# Patient Record
Sex: Female | Born: 2000
Health system: Southern US, Community
[De-identification: ages and names within clinical notes are randomized; demographics above are authoritative.]

## PROBLEM LIST (undated history)

## (undated) DIAGNOSIS — R569 Unspecified convulsions: Secondary | ICD-10-CM

## (undated) DIAGNOSIS — E119 Type 2 diabetes mellitus without complications: Secondary | ICD-10-CM

## (undated) DIAGNOSIS — G40A09 Absence epileptic syndrome, not intractable, without status epilepticus: Secondary | ICD-10-CM

## (undated) DIAGNOSIS — G4730 Sleep apnea, unspecified: Secondary | ICD-10-CM

## (undated) DIAGNOSIS — T7840XA Allergy, unspecified, initial encounter: Secondary | ICD-10-CM

## (undated) DIAGNOSIS — R198 Other specified symptoms and signs involving the digestive system and abdomen: Secondary | ICD-10-CM

## (undated) DIAGNOSIS — K529 Noninfective gastroenteritis and colitis, unspecified: Secondary | ICD-10-CM

## (undated) DIAGNOSIS — Z9889 Other specified postprocedural states: Secondary | ICD-10-CM

## (undated) DIAGNOSIS — L309 Dermatitis, unspecified: Secondary | ICD-10-CM

## (undated) DIAGNOSIS — T8859XA Other complications of anesthesia, initial encounter: Secondary | ICD-10-CM

## (undated) DIAGNOSIS — Q8789 Other specified congenital malformation syndromes, not elsewhere classified: Secondary | ICD-10-CM

## (undated) DIAGNOSIS — R7303 Prediabetes: Secondary | ICD-10-CM

## (undated) DIAGNOSIS — E301 Precocious puberty: Secondary | ICD-10-CM

## (undated) DIAGNOSIS — R278 Other lack of coordination: Secondary | ICD-10-CM

## (undated) DIAGNOSIS — R1310 Dysphagia, unspecified: Secondary | ICD-10-CM

## (undated) DIAGNOSIS — R112 Nausea with vomiting, unspecified: Secondary | ICD-10-CM

## (undated) DIAGNOSIS — E669 Obesity, unspecified: Secondary | ICD-10-CM

## (undated) DIAGNOSIS — F88 Other disorders of psychological development: Secondary | ICD-10-CM

## (undated) HISTORY — DX: Noninfective gastroenteritis and colitis, unspecified: K52.9

## (undated) HISTORY — DX: Other disorders of psychological development: F88

## (undated) HISTORY — DX: Absence epileptic syndrome, not intractable, without status epilepticus: G40.A09

## (undated) HISTORY — PX: TYMPANOSTOMY TUBE PLACEMENT: SHX32

## (undated) HISTORY — DX: Other lack of coordination: R27.8

## (undated) HISTORY — DX: Other specified congenital malformation syndromes, not elsewhere classified: Q87.89

## (undated) HISTORY — PX: TONSILLECTOMY AND ADENOIDECTOMY: SHX28

## (undated) HISTORY — PX: ADENOIDECTOMY: SUR15

## (undated) HISTORY — PX: TONSILLECTOMY: SUR1361

## (undated) HISTORY — DX: Dermatitis, unspecified: L30.9

---
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
## 2020-01-05 ENCOUNTER — Other Ambulatory Visit (INDEPENDENT_AMBULATORY_CARE_PROVIDER_SITE_OTHER): Payer: Self-pay | Admitting: Pediatric Endocrinology

## 2020-01-05 DIAGNOSIS — Z794 Long term (current) use of insulin: Secondary | ICD-10-CM

## 2020-01-05 DIAGNOSIS — E1165 Type 2 diabetes mellitus with hyperglycemia: Secondary | ICD-10-CM

## 2020-01-07 NOTE — Progress Notes (Signed)
S:     Chief Complaint  Patient presents with  . Diabetes    Education    Endocrinology provider: Dr. [name] (upcoming appt 01/17/20 10:00 AM)  Patient referred to me by Dr. [name] for closer DM management. 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 Patient was instructed to discontinue Lantus 5 units daily. I also advised patient to increase Ozempic dose from 0.25 mg subQ once weekly --> Ozempic 0.5 mg subQ once weekly on 01/01/20 (4 weeks from initiation of Ozempic). Also patient was having significant pain in her feet. Advised patient to apply Voltaren 2g gel before work, when patient gets home from work, and 3 hours after patient get home from work (can apply every 3 hours if you would like; 16 g/day to any single joint of lower extremity (max: 8x per day). Informed patient if she was having worse pain that was uncontrolled by Voltaren gel then she could feet in epsom salt 1-2x per week for up 15 minutes but dry completely then apply voltaren gel and apply lotion (make sure lotion is not alcohol based - cocoa butter is okay to use). If your feet become too dry then stop soaking in epsom salt.  Patient presents with mom. Patient has been having issues with Dexcom G6 CGM - specifically with adhesion. She states she is sweating more with new job and she is exercising more. She likes [employer] - she will increase working from [employer] (4 hour shifts) to Sun/Mon/Tues in the AM Wed/Thurs in the PM. 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 She will quit [employer] soon. 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  She confirms she increased Ozempic from 0.25 mg on 11/2. She experienced GI upset from 11/2 to 11/18.   Her foot pain is "terrible". She sees her orthopedic provider and podiatrist soon. She tried more Voltaren gelepsom salt - didn't help.  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  --Treats hypoglycemic episode with jolly ranchers  --Hypoglycemic symptoms: fatigue  Insurance Coverage: [employer] [facility] [employer], [place] [address]  [address] [place], [place] [address]  Phone:  [phone] Fax:  [phone]  DEA #:  [id number]  Medication Adherence -Patient reports adherence with medications.  -Current diabetes medications include: Ozempic 0.5 mg subQ once weekly (initiated 12/04/19; injects on Tuesdays) -Prior diabetes medications include: metformin (switched to Victoza), Trulicity (painful), Humalog/Novolog (correction), Victoza (switched to Ozempic), Lantus (able to taper off when patient initiated on Ozempic)  Injection Sites  (no changes since prior pharmacy appt on 12/11/19) -Patient reports injection sites are abdomen, outer legs, back of arms, top of buttocks --Patient reports independently injecting DM medications. --Patient reports rotating injection sites  Diet  (no changes since prior pharmacy appt on 12/11/19) Patient reported dietary habits:  Eats 2 meals/day and 0 snacks/day Breakfast (9AM): 1 piece of toast with peanut butter and applesauce Lunch (skips) Dinner (7PM): steak, hamburger, lima beans/green beans/broccoli/salad Snacks: does not snack Drinks: sugar  free drinks (gatorade, twist juice, water, milk) -Drinks milk every day with breakfast  Carbohydrate Intake -Pasta: once weekly -Rice: does not eat -Bread: sometimes eats roll with dinner (about once per week) -Cereal: does not eat -Corn: 3-4x per week -Peas: 3-4x per week -Potatoes: once weekly -Squash: does not eat -Fruit: 3-4x per week (pears, apples, grapes, strawberries)  Exercise  (no changes since prior pharmacy appt on 12/11/19): Patient-reported exercise habits: Wed (physical therapy), youtube body exercise video 1 hour every day, Zumba 7PM-8PM once weekly; she walks whenever she sees her BG is elevated and logs exercise on her Dexcom Clarity report   Monitoring: Patient 2x episodes of nocturia (nighttime urination) each night.  Patient denies neuropathy (nerve pain)  Patient denies  visual changes. (Followed by ophthalmology (last seen 11/01/2019)) Patient reports self foot exams.  -Patient reports socks/slippers in the house and shoes outside.  -Patient reports monitoring for open wounds/cuts on her feet.   O:  Dexcom G6 Report (12/11/19 - 01/17/20) v  Patient stopped wearing sensor from 12/25/19 - 01/13/20  Labs:     Vitals:   01/17/20 0940  BP: 120/78  Pulse: 80    Lab Results  Component Value Date   HGBA1C 6.6 (A) 12/11/2019   HGBA1C 6.3 (A) 09/20/2019   HGBA1C >14 05/17/2019    Lab Results  Component Value Date   CPEPTIDE 3.08 08/15/2017       Component Value Date/Time   CHOL 152 11/15/2018 1154   TRIG 63.0 11/15/2018 1154   HDL 38.20 (L) 11/15/2018 1154   CHOLHDL 4 11/15/2018 1154   VLDL 12.6 11/15/2018 1154   LDLCALC 101 (H) 11/15/2018 1154   LDLCALC 104 08/15/2017 0000    No results found for: MICRALBCREAT  Assessment:  DM medications - It is challenging to fully assess DM control considering patient has had issues wearing Dexcom G6 CGM, however, past few days of Dexcom use show TIR > 70% and no hypoglycemia. Encouraged patient for  her success! Patient was experiencing GI upset so will continue 0.5 mg dose and slow down titration. Will re-assess dose toleration in 1 month.  CGM issues - Discussed how to resolve Dexcom issues (adhesion) - 1) apply skin tac before --> apply dexcom --> apply skin tac on top of dexcom --> apply sensor overpatch 2) try grif grips.   Foot pain/foot care --> Patient attempted increase in Voltaren use/seldom use of Epsom salt. Neither provided relief for foot pain. Advised her to stop using Epsom salt. She can continue Voltaren use if she would like. Advised her to follow up with orthopedic provider and podiatrist.    Plan: 1. Medications: a. Continue Ozempic 0.5 mg subQ once weekly (will do slower titration due to GI upset) b. Will discuss further titration of Ozempic dose at follow up appointment 2. Diet:  a. Did not discuss at today's appointment 3. Exercise: a. Encouraged patient for continuing to log exercise on Dexcom app and for continuing to exercise on a frequent basis 4. Foot pain/foot care  a. Continue Voltaren 2g gel before work, when patient gets home from work, and 3 hours after patient get home from work (can apply every 3 hours if you would like; 16 g/day to any single joint of lower extremity (max: 8x per day). b. Stop seldom use  of epsom salt 5. BG Monitoring:  a. Continue using Dexcom G6 CGM (does NOT require PA via her insurance) b. Magda Muise has a diagnosis of diabetes, checks blood glucose readings > 4x per day, and requires frequent adjustments to insulin regimen. This patient will be seen every six months, minimally, to assess adherence to their CGM regimen and diabetes treatment plan. 6. Follow Up: 1 month  Written patient instructions provided.    This appointment required 30 minutes of patient care (this includes precharting, chart review, review of results, face-to-face care, etc.).  Thank you for involving clinical pharmacist/diabetes educator to assist in  providing this patient's care.  Drexel Iha, PharmD, CPP, CDCES

## 2020-01-17 ENCOUNTER — Other Ambulatory Visit: Payer: Self-pay

## 2020-01-17 ENCOUNTER — Encounter (INDEPENDENT_AMBULATORY_CARE_PROVIDER_SITE_OTHER): Payer: Self-pay | Admitting: Pharmacist

## 2020-01-17 ENCOUNTER — Ambulatory Visit (INDEPENDENT_AMBULATORY_CARE_PROVIDER_SITE_OTHER): Payer: 59 | Admitting: Pediatric Endocrinology

## 2020-01-17 ENCOUNTER — Ambulatory Visit (INDEPENDENT_AMBULATORY_CARE_PROVIDER_SITE_OTHER): Payer: 59 | Admitting: Pharmacist

## 2020-01-17 VITALS — BP 120/78 | HR 80 | Wt 209.2 lb

## 2020-01-17 DIAGNOSIS — L732 Hidradenitis suppurativa: Secondary | ICD-10-CM

## 2020-01-17 DIAGNOSIS — E11628 Type 2 diabetes mellitus with other skin complications: Secondary | ICD-10-CM | POA: Diagnosis not present

## 2020-01-17 DIAGNOSIS — Z794 Long term (current) use of insulin: Secondary | ICD-10-CM | POA: Diagnosis not present

## 2020-01-17 DIAGNOSIS — E1165 Type 2 diabetes mellitus with hyperglycemia: Secondary | ICD-10-CM

## 2020-01-17 LAB — POCT GLUCOSE (DEVICE FOR HOME USE): POC Glucose: 142 mg/dl — AB (ref 70–99)

## 2020-01-17 MED ORDER — CLINDAMYCIN PHOS-BENZOYL PEROX 1-5 % EX GEL: Freq: Two times a day (BID) | CUTANEOUS | 0 refills | Status: DC

## 2020-01-17 NOTE — Progress Notes (Signed)
Subjective:  Subjective  Patient Name: Deanna Levine Date of Birth: Feb 23, 2001  MRN: [id number]  Deanna Levine  presents to clinic today for follow-up evaluation and management of her  obesity, prediabetes, acanthosis   HISTORY OF PRESENT ILLNESS:   Chesni is a 19 y.o. AA female   Keyani is accompanied by her mother  1. Miroslava was initially followed in pediatric endocrine clinic for early puberty complicated by profound developmental delay. [name] now post menarchal and follows in clinic for Type 2 Diabetes and metabolic syndrome.    2. The patient's last [facility] visit was on 09/19/19 .   She has been working with Dr. Lovena Le over the past few months. [name] now wearing a Dexcom CGM and using Ozembic 0.5 mg once a week. She says that it hurts sometimes. She does like that she only has to do it once a week. She has had some upset stomach with it. They are planning to hold at her current dose for now.   [name] still having breakthrough bleeding from her hidradenitis on her chest and stomach and legs. She feels that they come up from nowhere and burst. She went to see [name] who said if she can treat them topically it is better than surgery.   She has continued to have pain in her feet. [name] following up with podiatry and orthopedics. [name] wearing a lift in one foot and that is the foot that is hurting the most.   [name] no longer taking any Lantus insulin.   She had a 2 days EEG for possible seizure- but the camera failed and they need to redo the entire thing.   When she sees that her sugar is higher on her Dexcom she gets up and exercises.   3. Pertinent Review of Systems:  Constitutional: The patient feels "good". The patient seems healthy and active. Eyes: Vision seems to be good. There are no recognized eye problems. Wears glasses.  Neck: The patient has no complaints of anterior neck swelling, soreness, tenderness, pressure, discomfort, or difficulty swallowing.   Heart:  Heart rate increases with exercise or other physical activity. The patient has no complaints of palpitations, irregular heart beats, chest pain, or chest pressure.   Lungs: no asthma or wheezing.  Gastrointestinal: Bowel movents seem normal. The patient has no complaints of excessive hunger, acid reflux, upset stomach, stomach aches or pains, diarrhea. Some constipation.  Legs: Muscle mass and strength seem normal. There are no complaints of numbness, tingling, burning, or pain. No edema is noted.  Feet: Per HPI Neurologic: Has been having staring spells. [name] followed by [name] in adult neurology GYN/GU: Menarche 07/2014, age 26. LMP 12/25/19. [name] no longer having issues with heavy periods. Skin: continued issues with hydradenitis and multiple abscesses- mostly axillary - now also up under her breasts.  Psych: She feels that she needs to talk to someone  Glucose review:      CGM Libre 2 Sensor. Avg Glu 132. 2% >250. 13% 181-250. 84% in target. 1% <70.   Last visit: CGM Libre 2 Sensor. Avg 124. 5% 181-250. 95% in target.    PAST MEDICAL, FAMILY, AND SOCIAL HISTORY  Past Medical History:  Diagnosis Date  . Allergy   . Difficulty swallowing pills   . Dyspraxia   . Global developmental delay   . Obesity (BMI 30-39.9)   . Pre-diabetes   . Precocious female puberty   . Seizures ([place])  last seizure 2012 - is being weaned off med., will finish med. 06/08/2013    Family History  Problem Relation Age of Onset  . Diabetes Mother        steroid induced; hx. colitis  . Anesthesia problems Mother        severe headache lasting 2-3 days  . Hypertension Maternal Grandmother   . Hyperlipidemia Maternal Grandmother   . Diabetes Maternal Grandmother   . Kidney disease Maternal Grandfather   . Hypertension Maternal Grandfather   . Heart disease Maternal Grandfather   . Hyperlipidemia Maternal Grandfather   . Kidney disease Paternal Grandmother   . Hypertension Paternal Grandmother    . Diabetes Paternal Grandmother   . Heart disease Paternal Grandmother   . Hyperlipidemia Paternal Grandmother   . Hypertension Paternal Grandfather   . Hyperlipidemia Paternal Grandfather   . Henoch-Schonlein purpura Brother        in remission  . Asthma Maternal Aunt   . Seizures Maternal Uncle      Current Outpatient Medications:  .  ACCU-CHEK GUIDE test strip, USE TO TEST BLOOD SUGAR LEVELS 4 TIMES DAILY (Patient not taking: Reported on 01/17/2020), Disp: 150 strip, Rfl: 5 .  Accu-Chek Softclix Lancets lancets, Use to test up to 6x daily (Patient not taking: Reported on 01/17/2020), Disp: 200 each, Rfl: 5 .  blood glucose meter kit and supplies, Dispense based on patient and insurance preference. Use up to four times daily as directed. (FOR ICD-10 E10.9, E11.9). (Patient not taking: Reported on 01/17/2020), Disp: 1 each, Rfl: 0 .  Blood Glucose Monitoring Suppl (ONETOUCH VERIO) w/Device KIT, 1 kit by Does not apply route as directed. Use glucometer to check sugar up to 3x per day (E11.9) (Patient not taking: Reported on 01/17/2020), Disp: 1 kit, Rfl: 3 .  cetirizine (ZYRTEC) 10 MG tablet, Take 10 mg by mouth daily., Disp: , Rfl:  .  chlorhexidine (HIBICLENS) 4 % external liquid, Apply topically daily as needed., Disp: 120 mL, Rfl: 0 .  clindamycin-benzoyl peroxide (BENZACLIN) gel, Apply topically 2 (two) times daily., Disp: 25 g, Rfl: 0 .  Continuous Blood Gluc Receiver (DEXCOM G6 RECEIVER) DEVI, 1 Device by Does not apply route as directed., Disp: 1 each, Rfl: 2 .  Continuous Blood Gluc Sensor (DEXCOM G6 SENSOR) MISC, Inject 1 applicator into the skin as directed. (change sensor every 10 days), Disp: 3 each, Rfl: 11 .  Continuous Blood Gluc Transmit (DEXCOM G6 TRANSMITTER) MISC, Inject 1 Device into the skin as directed. (re-use up to 8x with each new sensor), Disp: 1 each, Rfl: 3 .  diclofenac Sodium (VOLTAREN) 1 % GEL, Apply to right foot before work and after work for 60 days.,  Disp: 350 g, Rfl: 1 .  insulin glargine (LANTUS SOLOSTAR) 100 UNIT/ML Solostar Pen, Inject Up to 50 units per day (Patient not taking: Reported on 01/17/2020), Disp: 15 mL, Rfl: 5 .  insulin lispro (HUMALOG) 100 UNIT/ML KwikPen, Up to 45 units per day per sliding scale plus meal insulin as directed by physician (Patient not taking: Reported on 01/17/2020), Disp: 15 mL, Rfl: 5 .  Insulin Pen Needle (INSUPEN PEN NEEDLES) 32G X 4 MM MISC, BD Pen Needles- brand specific. Inject insulin via insulin pen 6 x daily (Patient not taking: Reported on 01/17/2020), Disp: 200 each, Rfl: 3 .  Multiple Vitamins-Minerals (MULTIVITAMIN ADULT) CHEW, Chew 1 tablet by mouth daily. Patient purchases [employer] Vitamin D3 K2 Gummies Bone and Heart Health Formula Immune Support Veggie Gummies brand.  This contains 5000 units vitamin D3 and 100 mcg of vitamin K2. (12/11/19 MT), Disp: , Rfl:  .  Semaglutide,0.25 or 0.5MG/DOS, (OZEMPIC, 0.25 OR 0.5 MG/DOSE,) 2 MG/1.5ML SOPN, Inject 0.25 mg into the skin once a week., Disp: 1.5 mL, Rfl: 11 .  triamcinolone (NASACORT ALLERGY 24HR) 55 MCG/ACT AERO nasal inhaler, Place 2 sprays into the nose daily., Disp: , Rfl:  .  tropicamide (MYDRIACYL) 1 % ophthalmic solution, 1 drop 2 (two) times daily., Disp: , Rfl:   Allergies as of 01/17/2020  . (No Known Allergies)     reports that she has never smoked. She has never used smokeless tobacco. She reports that she does not drink alcohol and does not use drugs. Pediatric History  Patient Parents  . Rithika, Seel (Mother)  . Demarcus Jr,Clifton (Father)   Other Topics Concern  . Not on file  Social History Narrative   Emaline is graduated. [name] doing average.    She enjoys playing softball and basketball.   Lives with her parents.    Volunteers Monday- Thursday at [place] and [employer] .    Left handed   Two story home   Working at [employer] and at [employer]. Mom makes her keep a budget and pay her own bills.   Primary  Care Provider: [name], MD     Objective:  Objective  Vital Signs:   BP 120/78   Pulse 80   Wt 209 lb 3.5 oz (94.9 kg)   LMP 12/25/2019   BMI 37.06 kg/m  Blood pressure percentiles are not available for patients who are 18 years or older.    Ht Readings from Last 3 Encounters:  10/08/19 5' 3"  (1.6 m) (31 %, Z= -0.51)*  09/19/19 5' 4.17" (1.63 m) (48 %, Z= -0.05)*  08/27/19 5' 4.26" (1.632 m) (50 %, Z= -0.01)*   * Growth percentiles are based on CDC (Girls, 2-20 Years) data.   Wt Readings from Last 3 Encounters:  01/17/20 209 lb 3.5 oz (94.9 kg) (98 %, Z= 2.07)*  01/17/20 209 lb 3.2 oz (94.9 kg) (98 %, Z= 2.07)*  12/11/19 214 lb 6.4 oz (97.3 kg) (98 %, Z= 2.14)*   * Growth percentiles are based on CDC (Girls, 2-20 Years) data.   HC Readings from Last 3 Encounters:  No data found for [facility]   Body surface area is 2.05 meters squared. No height on file for this encounter. 98 %ile (Z= 2.07) based on CDC (Girls, 2-20 Years) weight-for-age data using vitals from 01/17/2020.   PHYSICAL EXAM:   Constitutional: The patient appears healthy and well nourished. The patient's height and weight are advanced for age.  She has lost 5 pounds since last visit.  Head: The head is normocephalic. Face: The face appears normal. There are no obvious dysmorphic features. Eyes: The eyes appear to be normally formed and spaced. Gaze is conjugate. There is no obvious arcus or proptosis. Moisture appears normal. Ears: The ears are normally placed and appear externally normal. Mouth: The oropharynx and tongue appear normal. Dentition appears to be normal for age. Oral moisture is normal. Neck: The neck appears to be visibly normal. The thyroid gland is 13 grams in size. The consistency of the thyroid gland is normal. The thyroid gland is not tender to palpation. +2 acanthosis with thick scaling Lungs: no increased work of breathing Heart: regular pulses and peripheral perfusion Abdomen:  The abdomen appears to be enlarged in size for the patient's age. There is no  obvious hepatomegaly, splenomegaly, or other mass effect.  Arms: Muscle size and bulk are normal for age. Axillary acanthosis and hydradenitis.  Hands: There is no obvious tremor. Phalangeal and metacarpophalangeal joints are normal. Palmar muscles are normal for age. Palmar skin is normal. Palmar moisture is also normal. Legs: Muscles appear normal for age. No edema is present. Feet: Feet are normally formed. Dorsalis pedal pulses are normal. Neurologic: Strength is normal for age in both the upper and lower extremities. Muscle tone is normal. Sensation to touch is normal in both the legs and feet.   GYN/GU: normal female Skin: Hydradenitis on axillae, breasts, groin. Has bandaid in right inguinal area where lesion is draining.    Legs: no issues with sensation in feet- walking with a swinging movement of right leg from the hip. [name] unable to point to where the issue is originating. Now has a dx of leg length discrepancy and has a lift in one shoe- gait improved.  LAB DATA:    Lab Results  Component Value Date   HGBA1C 6.6 (A) 12/11/2019   HGBA1C 6.3 (A) 09/20/2019   HGBA1C >14 05/17/2019   HGBA1C 6.6 (A) 12/29/2018   HGBA1C 6.7 (H) 11/15/2018   HGBA1C 6.5 (A) 02/21/2018   HGBA1C 6.5 (A) 11/15/2017   HGBA1C 6.5 (A) 08/15/2017     Results for orders placed or performed in visit on 12/11/19  POCT Glucose (Device for Home Use)  Result Value Ref Range   Glucose Fasting, POC 163 (A) 70 - 99 mg/dL   POC Glucose    POCT glycosylated hemoglobin (Hb A1C)  Result Value Ref Range   Hemoglobin A1C 6.6 (A) 4.0 - 5.6 %   HbA1c POC (<> result, manual entry)     HbA1c, POC (prediabetic range)     HbA1c, POC (controlled diabetic range)           Assessment and Plan:  Assessment  ASSESSMENT:   Lounell is a 19 y.o. AA female with history of precocious puberty (which increases risk of W2XH, PCOS, Metabolic  syndrome) who now meets criteria for type 2 diabetes   Type 2 diabetes  - Diagnosed in January 2021 - Has been working closely with Dr. Lovena Le to improve glycemic control - Now using Dexcom and Ozempic.   Hydradenitis  - Has improved with improved glucose control - Saw [name] who recommended conservative management - will write for benazclin today   PLAN:    1. Diagnostic: A1C as above from 1 month ago 2. Therapeutic: Meds and devices as above.  3. Patient education: Lengthy discussion as above.   4. Follow-up: No follow-ups on file.      Lelon Huh, MD  Level of Service: >30 minutes spent today reviewing the medical chart, counseling the patient/family, and documenting today's encounter.    When a patient is on insulin, intensive monitoring of blood glucose levels is necessary to avoid hyperglycemia and hypoglycemia. Severe hyperglycemia/hypoglycemia can lead to hospital admissions and be life threatening.

## 2020-01-17 NOTE — Patient Instructions (Signed)
No changes today.   Benzaclin sent to pharmacy for the skin lesion. You can put this on twice a day as needed.

## 2020-01-17 NOTE — Patient Instructions (Addendum)
IT was a pleasure seeing you today!  I will see you in 1 month  Continue Ozempic 0.5 mg subQ once weekly

## 2020-01-18 ENCOUNTER — Ambulatory Visit (INDEPENDENT_AMBULATORY_CARE_PROVIDER_SITE_OTHER): Payer: 59 | Admitting: Family Medicine

## 2020-01-18 ENCOUNTER — Encounter: Payer: Self-pay | Admitting: Family Medicine

## 2020-01-18 DIAGNOSIS — E559 Vitamin D deficiency, unspecified: Secondary | ICD-10-CM | POA: Diagnosis not present

## 2020-01-18 DIAGNOSIS — M79604 Pain in right leg: Secondary | ICD-10-CM | POA: Diagnosis not present

## 2020-01-18 DIAGNOSIS — M25571 Pain in right ankle and joints of right foot: Secondary | ICD-10-CM

## 2020-01-18 DIAGNOSIS — R7982 Elevated C-reactive protein (CRP): Secondary | ICD-10-CM

## 2020-01-18 NOTE — Progress Notes (Signed)
Office Visit Note   Patient: Deanna Levine           Date of Birth: 2000-10-23           MRN: [id number] Visit Date: 01/18/2020 Requested by: [name], MD [address] [place],  [place] [address] PCP: [name], MD  Subjective: Chief Complaint  Patient presents with   Right Foot - Pain    Pain in the foot, to the point that she cries. Same area as before. Wearing the lift in the shoe. It's "not comfortable."    HPI: [name] here for follow-up chronic right leg and foot pain.  Since last visit she was doing okay with physical therapy, but in the past month her pain has gotten worse to the point that when she comes home from work, she is sometimes in tears.  She points to the ankle and foot as the location of her most intense pain.  Also in August when we did labs, her C-reactive protein was elevated and her vitamin D level was low.                ROS: No fevers or chills.  All other systems were reviewed and are negative.  Objective: Vital Signs: LMP 12/25/2019   Physical Exam:  General:  Alert and oriented, in no acute distress. Pulm:  Breathing unlabored. Psy:  Normal mood, congruent affect. Skin: No erythema or warmth. Right foot: She has limited motion in the subtalar joint.  She has tenderness to palpation in that area as well as in the midfoot.   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  No obvious stress fracture seen.    Assessment & Plan: 1.  Acute on chronic right foot and ankle pain, question subtalar coalition -MRI to further evaluate. -She has done well with having a lift in her shoe and plans to get another one.  She will also purchase a new pair of inserts.  2.  Elevated C-reactive protein, etiology uncertain -Recheck today.     Procedures: No procedures performed  No notes on file     PMFS History: Patient Active Problem List   Diagnosis Date Noted     Anemia 12/29/2018   Type 2 diabetes mellitus ([place]) 08/15/2017   Elevated triglycerides with high cholesterol 12/09/2016   Hydradenitis 09/07/2016   Partial epilepsy with impairment of consciousness ([place]) 08/16/2012   Mild intellectual disability 08/16/2012   Laxity of ligament 08/16/2012   Delayed milestones 08/16/2012   Obesity 06/07/2011   Dyspepsia 02/11/2011   Acanthosis nigricans 02/11/2011   Global developmental delay    Petit mal without grand mal seizures ([place])    Dyspraxia    Past Medical History:  Diagnosis Date   Allergy    Difficulty swallowing pills    Dyspraxia    Global developmental delay    Obesity (BMI 30-39.9)    Pre-diabetes    Precocious female puberty    Seizures ([place])    last seizure 2012 - is being weaned off med., will finish med. 06/08/2013    Family History  Problem Relation Age of Onset   Diabetes Mother        steroid induced; hx. colitis   Anesthesia problems Mother        severe headache lasting 2-3 days   Hypertension Maternal Grandmother    Hyperlipidemia Maternal Grandmother    Diabetes Maternal Grandmother    Kidney disease Maternal  Grandfather    Hypertension Maternal Grandfather    Heart disease Maternal Grandfather    Hyperlipidemia Maternal Grandfather    Kidney disease Paternal Grandmother    Hypertension Paternal Grandmother    Diabetes Paternal Grandmother    Heart disease Paternal Grandmother    Hyperlipidemia Paternal Grandmother    Hypertension Paternal Grandfather    Hyperlipidemia Paternal Grandfather    Henoch-Schonlein purpura Brother        in remission   Asthma Maternal Aunt    Seizures Maternal Uncle     Past Surgical History:  Procedure Laterality Date   SUPPRELIN IMPLANT  05/06/2011   Procedure: SUPPRELIN IMPLANT;  Surgeon: Jerilynn Mages. Gerald Stabs, MD;  Location: [facility];  Service: Pediatrics;  Laterality: Right;   SUPPRELIN IMPLANT Right 06/14/2013    Procedure: REMOVAL OF SUPPRELIN IMPLANT FROM RIGHT UPPER ARM;  Surgeon: Jerilynn Mages. Gerald Stabs, MD;  Location: [facility];  Service: Pediatrics;  Laterality: Right;   TONSILLECTOMY AND ADENOIDECTOMY     TYMPANOSTOMY TUBE PLACEMENT     x 2   Social History   Occupational History   Occupation: [employer]  Tobacco Use   Smoking status: Never Smoker   Smokeless tobacco: Never Used  Substance and Sexual Activity   Alcohol use: No    Alcohol/week: 0.0 standard drinks   Drug use: No   Sexual activity: Never

## 2020-01-19 LAB — SEDIMENTATION RATE: Sed Rate: 29 mm/h — ABNORMAL HIGH (ref 0–20)

## 2020-01-19 LAB — C-REACTIVE PROTEIN: CRP: 20.8 mg/L — ABNORMAL HIGH (ref <8.0)

## 2020-01-21 ENCOUNTER — Telehealth: Payer: Self-pay | Admitting: Family Medicine

## 2020-01-21 DIAGNOSIS — R7982 Elevated C-reactive protein (CRP): Secondary | ICD-10-CM

## 2020-01-21 DIAGNOSIS — M25551 Pain in right hip: Secondary | ICD-10-CM

## 2020-01-21 DIAGNOSIS — M79604 Pain in right leg: Secondary | ICD-10-CM

## 2020-01-21 NOTE — Telephone Encounter (Signed)
Labs show that the inflammation marker (CRP) is still elevated.  The reason for this is unclear.  Because you continue to have musculoskeletal pains, I would like to refer you to a rheumatologist for further evaluation.

## 2020-02-06 ENCOUNTER — Ambulatory Visit (INDEPENDENT_AMBULATORY_CARE_PROVIDER_SITE_OTHER): Payer: 59 | Admitting: Podiatry

## 2020-02-06 ENCOUNTER — Other Ambulatory Visit: Payer: Self-pay

## 2020-02-06 ENCOUNTER — Encounter: Payer: Self-pay | Admitting: Podiatry

## 2020-02-06 DIAGNOSIS — M79609 Pain in unspecified limb: Secondary | ICD-10-CM

## 2020-02-06 DIAGNOSIS — M2141 Flat foot [pes planus] (acquired), right foot: Secondary | ICD-10-CM

## 2020-02-06 DIAGNOSIS — E119 Type 2 diabetes mellitus without complications: Secondary | ICD-10-CM

## 2020-02-06 DIAGNOSIS — M2142 Flat foot [pes planus] (acquired), left foot: Secondary | ICD-10-CM

## 2020-02-06 DIAGNOSIS — B351 Tinea unguium: Secondary | ICD-10-CM | POA: Diagnosis not present

## 2020-02-06 DIAGNOSIS — M2170 Unequal limb length (acquired), unspecified site: Secondary | ICD-10-CM | POA: Diagnosis not present

## 2020-02-06 NOTE — Patient Instructions (Signed)
Please have Rheumatologist's labwork faxed to me.  Please have Orthopedist's note faxed to me.

## 2020-02-06 NOTE — Progress Notes (Signed)
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 female with a history of epilepsy, developmental delay, hidradenitis suppurativa, and leg length discrepancy here for evaluation of elevated ESR and CRP with history of right leg and foot pain. This pain has been worsening over the past few months. [name] standing for about 8 hours or more most days for work and pain is increased with use throughout the day and maximal at night afterwards. There is some lower extremity swelling worsening during the day. She has started using a shoe lift for leg length discrepancy but continues to have worse pain in the left ankle. She denies any past injuries or surgeries to this site. She does have hidradenitis suppurativa treated intermittently with antibiotics and topical Hibiclens currently controlled. Her mother notes she may have worsened pain after eating large amounts of gluten and starches but does not have associated digestive complaints.. She denies eye inflammation or redness. She has no oral ulcers, lymphadenopathy, diarrhea or blood in stools.  Labs reviewed 11/2019 ESR 29 CRP 20.8  09/2019 RF negative CCP negative ANA negative Vitamin D 26 Uric acid 6.4  Activities of Daily Living:  Patient reports morning stiffness for 1-24 hours.   Patient Reports nocturnal pain.  Difficulty dressing/grooming: Denies Difficulty climbing stairs: Denies Difficulty getting out of chair: Denies Difficulty using hands for taps, buttons, cutlery, and/or writing: Denies  Review of Systems  Constitutional: Negative for fatigue.  HENT: Negative for mouth sores, mouth dryness and  nose dryness.   Eyes: Negative for pain, itching, visual disturbance and dryness.  Respiratory: Negative for cough, hemoptysis, shortness of breath and difficulty breathing.   Cardiovascular: Negative for chest pain, palpitations and swelling in legs/feet.  Gastrointestinal: Negative for abdominal pain, blood in stool, constipation and diarrhea.  Endocrine: Negative for increased urination.  Genitourinary: Negative for painful urination.  Musculoskeletal: Positive for arthralgias, joint pain, joint swelling and morning stiffness. Negative for myalgias, muscle weakness, muscle tenderness and myalgias.  Skin: Positive for nodules/bumps. Negative for color change, rash and redness.  Allergic/Immunologic: Negative for susceptible to infections.  Neurological: Negative for dizziness, numbness, headaches, memory loss and weakness.  Hematological: Negative for swollen glands.  Psychiatric/Behavioral: Negative for confusion and sleep disturbance.    PMFS History:  Patient Active Problem List   Diagnosis Date Noted  . Elevated sedimentation rate 02/07/2020  . Pain in right ankle and joints of right foot 02/07/2020  . Anemia 12/29/2018  . Type 2 diabetes mellitus ([place]) 08/15/2017  . Elevated triglycerides with high cholesterol 12/09/2016  . Hydradenitis 09/07/2016  . Partial epilepsy with impairment of consciousness ([place]) 08/16/2012  . Mild intellectual disability 08/16/2012  . Laxity of ligament 08/16/2012  . Delayed milestones 08/16/2012  . Obesity 06/07/2011  . Dyspepsia 02/11/2011  . Acanthosis nigricans 02/11/2011  . Global developmental delay   . Petit mal without grand mal seizures ([place])   . Dyspraxia     Past Medical History:  Diagnosis Date  . Allergy   . Difficulty swallowing pills   .  Dyspraxia   . Global developmental delay   . Obesity (BMI 30-39.9)   . Pre-diabetes   . Precocious female puberty   . Seizures ([place])    last seizure 2012 - is being weaned off med., will  finish med. 06/08/2013    Family History  Problem Relation Age of Onset  . Diabetes Mother        steroid induced; hx. colitis  . Anesthesia problems Mother        severe headache lasting 2-3 days  . Rheum arthritis Mother   . Ulcerative colitis Mother   . Fibromyalgia Mother   . Hypertension Maternal Grandmother   . Hyperlipidemia Maternal Grandmother   . Diabetes Maternal Grandmother   . Kidney disease Maternal Grandfather   . Hypertension Maternal Grandfather   . Heart disease Maternal Grandfather   . Hyperlipidemia Maternal Grandfather   . Kidney disease Paternal Grandmother   . Hypertension Paternal Grandmother   . Diabetes Paternal Grandmother   . Heart disease Paternal Grandmother   . Hyperlipidemia Paternal Grandmother   . Hypertension Paternal Grandfather   . Hyperlipidemia Paternal Grandfather   . Henoch-Schonlein purpura Brother        in remission  . Asthma Maternal Aunt   . Seizures Maternal Uncle    Past Surgical History:  Procedure Laterality Date  . [place] IMPLANT  05/06/2011   Procedure: SUPPRELIN IMPLANT;  Surgeon: Jerilynn Mages. Gerald Stabs, MD;  Location: [facility];  Service: Pediatrics;  Laterality: Right;  . SUPPRELIN IMPLANT Right 06/14/2013   Procedure: REMOVAL OF SUPPRELIN IMPLANT FROM RIGHT UPPER ARM;  Surgeon: Jerilynn Mages. Gerald Stabs, MD;  Location: [facility];  Service: Pediatrics;  Laterality: Right;  . TONSILLECTOMY AND ADENOIDECTOMY    . TYMPANOSTOMY TUBE PLACEMENT     x 2   Social History   Social History Narrative   Deanna Levine is graduated. [name] doing average.    She enjoys playing softball and basketball.   Lives with her parents.    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    Left handed   Two story home   Immunization History  Administered Date(s) Administered  . Influenza,inj,Quad PF,6+ Mos 12/18/2018  . PFIZER SARS-COV-2 Vaccination 05/15/2019, 06/07/2019     Objective: Vital Signs: BP  112/64 (BP Location: Right Arm, Patient Position: Sitting, Cuff Size: Small)   Pulse 87   Ht 5' 5"  (1.651 m)   Wt 215 lb (97.5 kg)   BMI 35.78 kg/m    Physical Exam Constitutional:      Appearance: [name] obese.  HENT:     Right Ear: External ear normal.     Left Ear: External ear normal.     Mouth/Throat:     Mouth: Mucous membranes are moist.     Pharynx: Oropharynx is clear.  Eyes:     Conjunctiva/sclera: Conjunctivae normal.  Cardiovascular:     Rate and Rhythm: Normal rate and regular rhythm.  Pulmonary:     Effort: Pulmonary effort is normal.     Breath sounds: Normal breath sounds.  Skin:    General: Skin is warm and dry.     Comments: Multiple hyperpigmented and hypopigmented skin lesions of varying age in bilateral axillae and underneath breast and on lateral breast. No current drainage or erythema  Neurological:     General: No focal deficit present.     Mental Status: [name] alert.     Musculoskeletal Exam:  Neck full range of  motion no tenderness Shoulder, elbow, wrist, fingers full range of motion no tenderness or swelling Normal hip internal and external rotation without pain, no tenderness to lateral hip palpation Knees full ROM no appreciable effusion Right ankle ROM fairly intact but very stiff, with tenderness to palpation over achilles tendon, peroneal tendon, and plantar fascia. Limited ultrasound inspection did not show evidence of enthesitis   Investigation: No additional findings.  Imaging: No results found.  Recent Labs: Lab Results  Component Value Date   WBC 8.1 03/05/2019   HGB 11.3 03/05/2019   PLT 182 03/05/2019   NA 131 (L) 03/05/2019   K 4.9 03/05/2019   CL 97 03/05/2019   CO2 19 (L) 03/05/2019   GLUCOSE 549 (HH) 03/05/2019   BUN 12 03/05/2019   CREATININE 0.73 03/05/2019   BILITOT 0.4 03/05/2019   ALKPHOS 116 (H) 03/05/2019   AST 33 03/05/2019   ALT 24 03/05/2019   PROT 7.0 03/05/2019   ALBUMIN 4.4 03/05/2019   CALCIUM  9.5 03/05/2019   GFRAA 139 03/05/2019    Speciality Comments: Per patient, please forward labs to Dr. Acquanetta Sit at [facility]  Procedures:  No procedures performed Allergies: Patient has no known allergies.   Assessment / Plan:     Visit Diagnoses: Pain in right ankle and joints of right foot - Plan: Tissue transglutaminase, IgA, Gliadin antibodies, serum, Sedimentation rate, HLA-B27 antigen, IgA, CBC with Differential/Platelet  She currently describes ankle pain sounding more degenerative or overuse related than inflammatory with consistent worsening during the day when load bearing. Will screen for celiac disease with described history. HA association for spondyloarthritis and increased ESR will check HLA-B27 allele. CRP is frequently less reliable than ESR with morbid obesity but will repeat ESR. Will plan to f/u if findings for inflammatory process  Hydradenitis - Plan: CBC with Differential/Platelet, sedimentation rate  HA is associated with increased risk of spondyloarthritis but she denies extraarticular features and ultrasound exam was not indicative of active enthesitis. Sed rate can be elevated due to chronic low grade skin inflammation but will repeat labs today.  Orders: Orders Placed This Encounter  Procedures  . Tissue transglutaminase, IgA  . Gliadin antibodies, serum  . Sedimentation rate  . HLA-B27 antigen  . IgA  . CBC with Differential/Platelet   No orders of the defined types were placed in this encounter.   Follow-Up Instructions: No follow-ups on file.   Collier Salina, MD  Note - This record has been created using [employer].  Chart creation errors have been sought, but may not always  have been located. Such creation errors do not reflect on  the standard of medical care.

## 2020-02-07 ENCOUNTER — Ambulatory Visit (INDEPENDENT_AMBULATORY_CARE_PROVIDER_SITE_OTHER): Payer: 59 | Admitting: Internal Medicine

## 2020-02-07 ENCOUNTER — Encounter: Payer: Self-pay | Admitting: Internal Medicine

## 2020-02-07 VITALS — BP 112/64 | HR 87 | Ht 65.0 in | Wt 215.0 lb

## 2020-02-07 DIAGNOSIS — L732 Hidradenitis suppurativa: Secondary | ICD-10-CM

## 2020-02-07 DIAGNOSIS — R700 Elevated erythrocyte sedimentation rate: Secondary | ICD-10-CM

## 2020-02-07 DIAGNOSIS — M25571 Pain in right ankle and joints of right foot: Secondary | ICD-10-CM | POA: Diagnosis not present

## 2020-02-07 LAB — CBC WITH DIFFERENTIAL/PLATELET
Absolute Monocytes: 344 cells/uL (ref 200–950)
Basophils Absolute: 17 cells/uL (ref 0–200)
Basophils Relative: 0.2 %
Eosinophils Absolute: 50 cells/uL (ref 15–500)
Eosinophils Relative: 0.6 %
HCT: 35.1 % (ref 35.0–45.0)
Hemoglobin: 10.6 g/dL — ABNORMAL LOW (ref 11.7–15.5)
Lymphs Abs: 2461 cells/uL (ref 850–3900)
MCH: 20.6 pg — ABNORMAL LOW (ref 27.0–33.0)
MCHC: 30.2 g/dL — ABNORMAL LOW (ref 32.0–36.0)
MCV: 68.2 fL — ABNORMAL LOW (ref 80.0–100.0)
MPV: 10.7 fL (ref 7.5–12.5)
Monocytes Relative: 4.1 %
Neutro Abs: 5527 cells/uL (ref 1500–7800)
Neutrophils Relative %: 65.8 %
Platelets: 379 10*3/uL (ref 140–400)
RBC: 5.15 10*6/uL — ABNORMAL HIGH (ref 3.80–5.10)
RDW: 17.7 % — ABNORMAL HIGH (ref 11.0–15.0)
Total Lymphocyte: 29.3 %
WBC: 8.4 10*3/uL (ref 3.8–10.8)

## 2020-02-07 LAB — CBC MORPHOLOGY

## 2020-02-07 NOTE — Patient Instructions (Signed)
Celiac Disease Antibodies Test Why am I having this test? The celiac disease antibodies test is a blood test that is done to help diagnose celiac disease. People who have celiac disease cannot tolerate the proteins gluten and gliadin, which are found in wheat and wheat products. You may have the test if you have symptoms of celiac disease. These include:  Long-term (chronic) diarrhea.  Belly (abdominal) pain.  Weight loss. You may also have this test if you have increased risk for celiac disease, such as having a close family member who has the disease. What is being tested? This test examines your blood for the presence of specific antibodies that are common to celiac disease. Antibodies are proteins that your body normally makes to protect itself from germs that can make you sick. In people who have celiac disease, the body produces antibodies in response to the proteins gluten and gliadin. What kind of sample is taken?  A blood sample is required for this test. It is usually collected by inserting a needle into a blood vessel. How do I prepare for this test? You may be asked to provide a list of foods that you have eaten in the 48 hours before the test. If you have eaten foods that contain gluten, the test will show a strong antibody response if you do have celiac disease. Check with your health care provider for specific instructions. How are the results reported? Some of your test results will be reported as values. Your health care provider will compare your results to normal ranges that were established after testing a large group of people (reference ranges). Reference ranges may vary among labs and hospitals. For this test, common reference ranges for the three common celiac disease antibodies are as follows:  Gliadin IgA and IgG: ? Birth to 19 years of age: less than 20 EU. ? 38 years of age and older: less than 25 EU.  Tissue transglutaminase IgA, all ages: less than 20  EU. Other results will be reported as positive or negative. For this test, normal results are:  Negative for endomysial IgA, all ages. What do the results mean? High levels of antibodies or a positive result may mean that you have celiac disease. High levels of gliadin antibodies may also be caused by Crohn's disease, colitis, and severe lactose intolerance. Talk with your health care provider about what your results mean. Questions to ask your health care provider Ask your health care provider, or the department that is doing the test:  When will my results be ready?  How will I get my results?  What are my treatment options?  What other tests do I need?  What are my next steps? Summary  The celiac disease antibodies test is a blood test that is done to help diagnose celiac disease.  This test examines your blood for the presence of specific antibodies that are common in people who have celiac disease.  The presence of certain antibodies, or antibody levels that are above the normal range, may indicate that you have celiac disease.  Talk with your health care provider about what your results mean.    Erythrocyte Sedimentation Rate Test Why am I having this test? The erythrocyte sedimentation rate (ESR) test is used to help find illnesses related to:  Sudden (acute) or long-term (chronic) infections.  Inflammation.  The body's disease-fighting system attacking healthy cells (autoimmune diseases).  Cancer.  Tissue death. If you have symptoms that may be related to any  of these illnesses, your health care provider may do an ESR test before doing more specific tests. If you have an inflammatory immune disease, such as rheumatoid arthritis, you may have this test to help monitor your therapy. What is being tested? This test measures how long it takes for your red blood cells (erythrocytes) to settle in a solution over a certain amount of time (sedimentation rate). When you  have an infection or inflammation, your red blood cells clump together and settle faster. The sedimentation rate provides information about how much inflammation is present in the body. What kind of sample is taken?  A blood sample is required for this test. It is usually collected by inserting a needle into a blood vessel. How do I prepare for this test? Follow any instructions from your health care provider about changing or stopping your regular medicines. Tell a health care provider about:  Any allergies you have.  All medicines you are taking, including vitamins, herbs, eye drops, creams, and over-the-counter medicines.  Any blood disorders you have.  Any surgeries you have had.  Any medical conditions you have, such as thyroid or kidney disease.  Whether you are pregnant or may be pregnant. How are the results reported? Your results will be reported as a value that measures sedimentation rate in millimeters per hour (mm/hr). Your health care provider will compare your results to normal ranges that were established after testing a large group of people (reference values). Reference values may vary among labs and hospitals. For this test, common reference values, which vary by age and gender, are:  Newborn: 0-2 mm/hr.  Child, up to puberty: 0-10 mm/hr.  Female: ? Under 50 years: 0-20 mm/hr. ? 50-85 years: 0-30 mm/hr. ? Over 85 years: 0-42 mm/hr.  Female: ? Under 50 years: 0-15 mm/hr. ? 50-85 years: 0-20 mm/hr. ? Over 85 years: 0-30 mm/hr. Certain conditions or medicines may cause ESR levels to be falsely lower or higher, such as:  Pregnancy.  Obesity.  Steroids, birth control pills, and blood thinners.  Thyroid or kidney disease. What do the results mean? Results that are within reference values are considered normal, meaning that the level of inflammation in your body is healthy. High ESR levels mean that there is inflammation in your body. You will have more tests to  help make a diagnosis. Inflammation may result from many different conditions or injuries. Talk with your health care provider about what your results mean. Questions to ask your health care provider Ask your health care provider, or the department that is doing the test:  When will my results be ready?  How will I get my results?  What are my treatment options?  What other tests do I need?  What are my next steps? Summary  The erythrocyte sedimentation rate (ESR) test is used to help find illnesses associated with sudden (acute) or long-term (chronic) infections, inflammation, autoimmune diseases, cancer, or tissue death.  If you have symptoms that may be related to any of these illnesses, your health care provider may do an ESR test before doing more specific tests. If you have an inflammatory immune disease, such as rheumatoid arthritis, you may have this test to help monitor your therapy.  This test measures how long it takes for your red blood cells (erythrocytes) to settle in a solution over a certain amount of time (sedimentation rate). This provides information about how much inflammation is present in the body. This information is not intended to replace advice  given to you by your health care provider. Make sure you discuss any questions you have with your health care provider. Document Revised: 10/05/2016 Document Reviewed: 10/05/2016 Elsevier Patient Education  [place].

## 2020-02-08 LAB — GLIADIN ANTIBODIES, SERUM
Gliadin IgA: <1 U/mL
Gliadin IgG: <1 U/mL

## 2020-02-08 LAB — TISSUE TRANSGLUTAMINASE, IGA: (tTG) Ab, IgA: <1 U/mL

## 2020-02-08 LAB — SED RATE MANUAL WEST RFLX: SED RATE BY MODIFIED WESTERGREN,MANUAL: 24 mm/h — ABNORMAL HIGH (ref 0–20)

## 2020-02-08 LAB — SEDIMENTATION RATE

## 2020-02-08 LAB — HLA-B27 ANTIGEN: HLA-B27 Antigen: NEGATIVE

## 2020-02-08 LAB — IGA: Immunoglobulin A: 156 mg/dL (ref 47–310)

## 2020-02-09 ENCOUNTER — Other Ambulatory Visit: Payer: 59

## 2020-02-10 NOTE — Progress Notes (Signed)
Subjective:  Patient ID: Deanna Levine, female    DOB: 11-Oct-2000,  MRN: [id number]  19 y.o. female presents with preventative diabetic foot care and painful thick toenails that are difficult to trim. Pain interferes with ambulation. Aggravating factors include wearing enclosed shoe gear. Pain is relieved with periodic professional debridement.   Deanna Levine has h/o seizures, developmental delay and mild intellectual disability. Therefore, her parents are very involved in making her health care decisions.  Mom is present during today's visit. . Mom states Deanna Levine complains of right foot pain when she gets off of work. [name] working at [employer].  She will be seeing Rheumatologist March 05, 2020. She will see the [facility] Orth[facility] on February 14, 2020. She continues to see physical therapy.   Mom is requesting new pair of Powersteps Arch supports as well as heel lifts.  Deanna Levine has two year h/o diabetes. She has no h/o foot wounds. She denies any numbness, tingling or burning.  Review of Systems: Negative except as noted in the HPI.  Past Medical History:  Diagnosis Date  . Allergy   . Difficulty swallowing pills   . Dyspraxia   . Global developmental delay   . Obesity (BMI 30-39.9)   . Pre-diabetes   . Precocious female puberty   . Seizures ([place])    last seizure 2012 - is being weaned off med., will finish med. 06/08/2013   Past Surgical History:  Procedure Laterality Date  . [place] IMPLANT  05/06/2011   Procedure: SUPPRELIN IMPLANT;  Surgeon: Jerilynn Mages. Gerald Stabs, MD;  Location: [facility];  Service: Pediatrics;  Laterality: Right;  . SUPPRELIN IMPLANT Right 06/14/2013   Procedure: REMOVAL OF SUPPRELIN IMPLANT FROM RIGHT UPPER ARM;  Surgeon: Jerilynn Mages. Gerald Stabs, MD;  Location: [facility];  Service: Pediatrics;  Laterality: Right;  . TONSILLECTOMY AND ADENOIDECTOMY    . TYMPANOSTOMY TUBE PLACEMENT     x 2   Patient Active Problem  List   Diagnosis Date Noted  . Elevated sedimentation rate 02/07/2020  . Pain in right ankle and joints of right foot 02/07/2020  . Anemia 12/29/2018  . Type 2 diabetes mellitus ([place]) 08/15/2017  . Elevated triglycerides with high cholesterol 12/09/2016  . Hydradenitis 09/07/2016  . Partial epilepsy with impairment of consciousness ([place]) 08/16/2012  . Mild intellectual disability 08/16/2012  . Laxity of ligament 08/16/2012  . Delayed milestones 08/16/2012  . Obesity 06/07/2011  . Dyspepsia 02/11/2011  . Acanthosis nigricans 02/11/2011  . Global developmental delay   . Petit mal without grand mal seizures ([place])   . Dyspraxia     Current Outpatient Medications:  .  ACCU-CHEK GUIDE test strip, USE TO TEST BLOOD SUGAR LEVELS 4 TIMES DAILY, Disp: 150 strip, Rfl: 5 .  Accu-Chek Softclix Lancets lancets, Use to test up to 6x daily, Disp: 200 each, Rfl: 5 .  blood glucose meter kit and supplies, Dispense based on patient and insurance preference. Use up to four times daily as directed. (FOR ICD-10 E10.9, E11.9)., Disp: 1 each, Rfl: 0 .  Blood Glucose Monitoring Suppl (ONETOUCH VERIO) w/Device KIT, 1 kit by Does not apply route as directed. Use glucometer to check sugar up to 3x per day (E11.9), Disp: 1 kit, Rfl: 3 .  cetirizine (ZYRTEC) 10 MG tablet, Take 10 mg by mouth daily., Disp: , Rfl:  .  chlorhexidine (HIBICLENS) 4 % external liquid, Apply topically daily as needed., Disp: 120 mL, Rfl: 0 .  clindamycin-benzoyl peroxide (BENZACLIN) gel, Apply topically 2 (two) times daily., Disp: 25 g, Rfl: 0 .  Continuous Blood Gluc Receiver (DEXCOM G6 RECEIVER) DEVI, 1 Device by Does not apply route as directed., Disp: 1 each, Rfl: 2 .  Continuous Blood Gluc Sensor (DEXCOM G6 SENSOR) MISC, Inject 1 applicator into the skin as directed. (change sensor every 10 days), Disp: 3 each, Rfl: 11 .  Continuous Blood Gluc Transmit (DEXCOM G6 TRANSMITTER) MISC, Inject 1 Device into the skin as directed. (re-use up  to 8x with each new sensor), Disp: 1 each, Rfl: 3 .  diclofenac Sodium (VOLTAREN) 1 % GEL, Apply to right foot before work and after work for 60 days., Disp: 350 g, Rfl: 1 .  insulin glargine (LANTUS SOLOSTAR) 100 UNIT/ML Solostar Pen, Inject Up to 50 units per day (Patient not taking: Reported on 02/07/2020), Disp: 15 mL, Rfl: 5 .  insulin lispro (HUMALOG) 100 UNIT/ML KwikPen, Up to 45 units per day per sliding scale plus meal insulin as directed by physician (Patient not taking: Reported on 02/07/2020), Disp: 15 mL, Rfl: 5 .  Insulin Pen Needle (INSUPEN PEN NEEDLES) 32G X 4 MM MISC, BD Pen Needles- brand specific. Inject insulin via insulin pen 6 x daily, Disp: 200 each, Rfl: 3 .  Multiple Vitamins-Minerals (MULTIVITAMIN ADULT) CHEW, Chew 1 tablet by mouth daily. Patient purchases [employer] Vitamin D3 K2 Gummies Bone and Heart Health Formula Immune Support Veggie Gummies brand. This contains 5000 units vitamin D3 and 100 mcg of vitamin K2. (12/11/19 MT), Disp: , Rfl:  .  Semaglutide,0.25 or 0.5MG/DOS, (OZEMPIC, 0.25 OR 0.5 MG/DOSE,) 2 MG/1.5ML SOPN, Inject 0.25 mg into the skin once a week., Disp: 1.5 mL, Rfl: 11 .  triamcinolone (NASACORT) 55 MCG/ACT AERO nasal inhaler, Place 2 sprays into the nose as needed., Disp: , Rfl:  .  tropicamide (MYDRIACYL) 1 % ophthalmic solution, 1 drop 2 (two) times daily. (Patient not taking: Reported on 02/07/2020), Disp: , Rfl:  No Known Allergies Social History   Tobacco Use  Smoking Status Never Smoker  Smokeless Tobacco Never Used   Objective:  There were no vitals filed for this visit. Constitutional Patient is a pleasant 19 y.o. African American female obese in NAD. AAO x 3.  Vascular Neurovascular status unchanged b/l lower extremities. Capillary refill time to digits immediate b/l. Palpable pedal pulses b/l LE. Pedal hair present. Lower extremity skin temperature gradient within normal limits. No pain with calf compression b/l. No cyanosis or clubbing  noted.  Neurologic Normal speech. Oriented to person, place, and time. Protective sensation intact 5/5 intact bilaterally with 10g monofilament b/l.  Dermatologic Pedal skin with normal turgor, texture and tone bilaterally. No open wounds bilaterally. No interdigital macerations bilaterally. Toenails L hallux and R hallux elongated, discolored, dystrophic, thickened, and crumbly with subungual debris and tenderness to dorsal palpation.  Orthopedic: Normal muscle strength 5/5 to all lower extremity muscle groups bilaterally. No pain crepitus or joint limitation noted with ROM b/l. No gross bony deformities bilaterally. Patient ambulates independent of any assistive aids. Gait evaluation reveals genu valgum right LE. Obvious limp with shorter RLE.   Hemoglobin A1C Latest Ref Rng & Units 12/11/2019 09/20/2019 05/17/2019  HGBA1C 4.0 - 5.6 % 6.6(A) 6.3(A) >14  Some recent data might be hidden     Assessment:   1. Pain due to onychomycosis of nail   2. Pes planus of both feet   3. Lower limb length difference   4. Type 2 diabetes mellitus without  complication, without long-term current use of insulin ([place])   5. Encounter for diabetic foot exam [facility])    Plan:  Patient was evaluated and treated and all questions answered.  Onychomycosis with pain -Nails palliatively debridement as below. -Educated on self-care  Procedure: Nail Debridement Rationale: Pain Type of Debridement: manual, sharp debridement. Instrumentation: Nail nipper, rotary burr. Number of Nails: 2  -Examined patient. -Continue diabetic foot care principles. -Toenails b/l great toesl were debrided in length and girth with sterile nail nippers and dremel without iatrogenic bleeding.  -Patient to report any pedal injuries to medical professional immediately. Romberger continues to see Ortho for her right leg. -I have asked that notes/labs from upcoming Rheumatology visit be faxed to our office.  -Patient to continue soft,  supportive shoe gear daily. -Patient/POA to call should there be question/concern in the interim.  Return in about 3 months (around 05/06/2020).  Marzetta Board, DPM

## 2020-02-11 NOTE — Progress Notes (Signed)
Lab results show mild anemia that is chronic and similar to previous values. Tests for celiac disease negative, and HLA-B27 allele negative. Her sedimentation rate remains mildly elevated. She does have skin inflammation and also her blood cell abnormality can affect these test results. I do not suspect autoimmune disease is causing her joint pain and would not start any new treatment for that. No specific follow up unless new symptoms need to be evaluated.

## 2020-02-11 NOTE — Progress Notes (Deleted)
S:     No chief complaint on file.   Endocrinology provider: Dr. [name] (upcoming appt 04/25/19 9:00 AM)  Patient referred to me by Dr. [name] for closer DM management. PMH significant for T2DM, acanthosis nigricans, hydradenitis, petit mal without grand mal seizures, global developmental delay, dyspraxia, dyspepsia, obesity, mild intellectual disability, elevated TG, and anemia.She was seen on 12/04/19 to switch from [place] 2.0 to Dexcom G6 due to issues with adhesion as well as cost (FSL $75 vs Dexcom G6 $0)  At prior appt with patient on 01/17/20, Dexcom Clarity report showed patient had TIR 91%, 1% low, 0% very low. However, patient stopped wearing sensor from 12/25/19 - 01/13/20 due to issues with Dexcom adhesion. Advised patient to use Dexcom with Skin Tac and overpatches. She did increase Ozempic from 0.25 mg subQ once weekly --> 0.5 mg subQ once weekly. However, she did report GI upset from 01/01/20 - 01/17/20. Advised patient to slow down taper therefore planned on continuing Ozempic 0.5 mg subQ once weekly for an additional month.   Patient presents with mom. ***  Issues with Dexcom adhesion? GI upset with Ozempic? Quit [employer]? [place] Environmental services job?  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 She was planning on quitting [employer] soon. 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  Diabetes Diagnosis: 03/2019  Family History: mother (T2DM), father (T2DM)  Patient-Reported BG Readings:  -Patient *** hypoglycemic events --Treats hypoglycemic episode with jolly ranchers  --Hypoglycemic symptoms:  fatigue  Insurance Coverage: Public affairs consultant [facility] [place], [place] [facility] [place]  [address] [place] [address]  Phone:  [phone] Fax:  [phone]  DEA #:  [id number]  Medication Adherence -Patient *** adherence with medications.  -Current diabetes medications include: Ozempic 0.5 mg subQ once weekly (initiated 12/04/19; injects on Tuesdays) -Prior diabetes medications include: metformin (switched to Victoza), Trulicity (painful), Humalog/Novolog (correction), Victoza (switched to Ozempic), Lantus (able to taper off when patient initiated on Ozempic)  Injection Sites  (*** changes since prior pharmacy appt on 01/17/20) -Patient reports injection sites are abdomen, outer legs, back of arms, top of buttocks --Patient reports independently injecting DM medications. --Patient reports rotating injection sites  Diet  (*** changes since prior pharmacy appt on 01/17/20) Patient reported dietary habits:  Eats 2 meals/day and 0 snacks/day Breakfast (9AM): 1 piece of toast with peanut butter and applesauce Lunch (skips) Dinner (7PM): steak, hamburger, lima beans/green beans/broccoli/salad Snacks: does not snack Drinks: sugar free drinks (gatorade, twist juice, water, milk) -Drinks milk every day with breakfast  Carbohydrate Intake -Pasta: once weekly -Rice: does not eat -Bread: sometimes eats roll with dinner (about once per week) -Cereal: does not eat -Corn: 3-4x per week -Peas: 3-4x per week -Potatoes: once weekly -Squash: does not eat -Fruit: 3-4x per week (pears, apples, grapes, strawberries)  Exercise  (*** changes since prior pharmacy appt on 01/17/20): Patient-reported exercise habits: Wed (physical therapy), youtube body exercise video 1 hour every day, Zumba 7PM-8PM once weekly; she walks whenever she sees her BG is elevated and logs exercise on her  Dexcom Clarity report    Monitoring: Patient *** episodes of nocturia (nighttime urination) each night.  Patient *** neuropathy (nerve pain)  Patient ***  visual changes. (Followed by ophthalmology (last seen 11/01/2019)) Patient *** self foot exams.  -Patient *** socks/slippers in the house and shoes outside.  -Patient *** monitoring for open wounds/cuts on her feet.   O:  Dexcom G6 Report ***  Labs:     There were no vitals filed for this visit.  Lab Results  Component Value Date   HGBA1C 6.6 (A) 12/11/2019   HGBA1C 6.3 (A) 09/20/2019   HGBA1C >14 05/17/2019    Lab Results  Component Value Date   CPEPTIDE 3.08 08/15/2017       Component Value Date/Time   CHOL 152 11/15/2018 1154   TRIG 63.0 11/15/2018 1154   HDL 38.20 (L) 11/15/2018 1154   CHOLHDL 4 11/15/2018 1154   VLDL 12.6 11/15/2018 1154   LDLCALC 101 (H) 11/15/2018 1154   LDLCALC 104 08/15/2017 0000    No results found for: MICRALBCREAT  Assessment:  DM medication management -     Plan: 1. Medications: a. *** Ozempic 0.5 mg subQ once weekly 2. Diet:  a. *** 3. Exercise: a. *** 4. BG Monitoring:  a. Continue using Dexcom G6 CGM (does NOT require PA via her insurance) b. Deanna Levine has a diagnosis of diabetes, checks blood glucose readings > 4x per day, and requires frequent adjustments to insulin regimen. This patient will be seen every six months, minimally, to assess adherence to their CGM regimen and diabetes treatment plan. 5. Follow Up: ***  Written patient instructions provided.    This appointment required *** minutes of patient care (this includes precharting, chart review, review of results, face-to-face care, etc.).  Thank you for involving clinical pharmacist/diabetes educator to assist in providing this patient's care.  Drexel Iha, PharmD, CPP, CDCES

## 2020-02-14 ENCOUNTER — Ambulatory Visit (INDEPENDENT_AMBULATORY_CARE_PROVIDER_SITE_OTHER): Payer: 59 | Admitting: Pharmacist

## 2020-02-14 ENCOUNTER — Ambulatory Visit: Payer: 59 | Admitting: Orthopedic Surgery

## 2020-02-14 ENCOUNTER — Ambulatory Visit (INDEPENDENT_AMBULATORY_CARE_PROVIDER_SITE_OTHER): Payer: 59 | Admitting: Psychology

## 2020-02-14 ENCOUNTER — Ambulatory Visit (INDEPENDENT_AMBULATORY_CARE_PROVIDER_SITE_OTHER): Payer: 59 | Admitting: Orthopedic Surgery

## 2020-02-14 ENCOUNTER — Other Ambulatory Visit: Payer: Self-pay

## 2020-02-14 ENCOUNTER — Encounter: Payer: Self-pay | Admitting: Orthopedic Surgery

## 2020-02-14 DIAGNOSIS — Z794 Long term (current) use of insulin: Secondary | ICD-10-CM

## 2020-02-14 DIAGNOSIS — F4323 Adjustment disorder with mixed anxiety and depressed mood: Secondary | ICD-10-CM | POA: Diagnosis not present

## 2020-02-14 DIAGNOSIS — M6701 Short Achilles tendon (acquired), right ankle: Secondary | ICD-10-CM | POA: Diagnosis not present

## 2020-02-14 DIAGNOSIS — M6702 Short Achilles tendon (acquired), left ankle: Secondary | ICD-10-CM

## 2020-02-14 DIAGNOSIS — E1165 Type 2 diabetes mellitus with hyperglycemia: Secondary | ICD-10-CM

## 2020-02-14 DIAGNOSIS — F70 Mild intellectual disabilities: Secondary | ICD-10-CM

## 2020-02-14 DIAGNOSIS — M79672 Pain in left foot: Secondary | ICD-10-CM | POA: Diagnosis not present

## 2020-02-14 DIAGNOSIS — M79671 Pain in right foot: Secondary | ICD-10-CM | POA: Diagnosis not present

## 2020-02-14 DIAGNOSIS — E11628 Type 2 diabetes mellitus with other skin complications: Secondary | ICD-10-CM

## 2020-02-14 NOTE — BH Specialist Note (Signed)
Integrated Behavioral Health Follow Up In-Person Visit  MRN: [id number] Name: Deanna Levine  Number of [place] Clinician visits: 4/6 Session Start time: 9:35 AM  Session End time: 10:05 AM Total time: 30 minutes  Types of Service: Individual psychotherapy  Interpretor:No. Interpretor Name and Language: N/A  Subjective: Deanna Levine a 19 y.o.femalewith Type 2 Diabetes Mellitus, developmental delay, partial epilepsy with impairment of consciousness and obesity, who isaccompanied byMother Patient was referred byDr. Kandis Ban stress andhistorical difficulties with compliance to medical regime. Patient reports the following symptoms/concerns:historical difficulty complying with medical regime although currently making great lifestyle changes! Struggling with diabetes burnout including frustration and fatigue with medication, nutrition and exercise habits. Also, having difficulty figuring out next steps in terms of life plan/employment in context of learning disabilities and developmental delays. [name] working at [employer]. Duration of problem:years; Severity of problem:moderate  Deanna Levine is trying to eat smaller portions.  [name] having pain in feet and is going to need surgery.  [name] also seeing genetics, neurology, and rheumatology.  [name] working at [employer] now and has 2 jobs. [name] working 30 hours per week and makes $600-$800 per month.  Her mother would like her to get some kind of assistance.   Deanna Levine took booster yesterday and has been nauseous.    Family is interested in a care manager to help with coordinating appointments.  Objective: Mood:Euthymicand Affect: Appropriate Risk of harm to self or others:No plan to harm self or others Life Context: Family and Social:Has a supportive family that advocates for her health and vocational needs. Lives with mom and dad. Gets along well with dad. Some conflict related to  chores and life plan in context of learning problems. She has a brother that is 36 years old and is out on his own. He is gifted and now has a good job. Deanna Levine has difficulty understanding that she may need more support and time to learn new skills before functioning independently.  Is not able to keep up with friends too well. She does talk to people on the phone some (has boyfriend, teachers). Dating boyfriend for 3 years (Deanna Levine). Says he is nice and sweet and helpful. School/Work:Graduated in 2020. Works at [employer] (started in June, 2021). Used to work at [employer]. 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 Got fired from [employer] for working too slow May 1st. . Self-Care:Likes to play videos Deanna Levine, Deanna Levine). Life Changes working 2 jobs now; lots of family stress with health difficulties   Patient and/or Family's Strengths/Protective Factors: Concrete supports in place (healthy food, safe environments, etc.), Caregiver has knowledge of parenting & child development and Parental Resilience  Deanna Levine's mother is a an advocate for her helping her to access appropriate services and supports given her complex medical and psychosocial needs  Goals Addressed: Patient will: Improve compliance with healthy lifestyle changes and medication compliance with diabetes Patient will learn to be more independent with activities of daily living  Progress towards Goals: Ongoing  Deanna Levine is making healthy lifestyle changes (e.g. eating smaller portions).  She continues to struggle with self-care tasks such as grooming independently.  Her mother is inquiring about additional supports to help Deanna Levine.  She would like to explore gaining guardianship, getting Deanna Levine and having a Transport planner in future.  Interventions: Interventions utilized:  Supportive Counseling, Functional Assessment of ADLs, Psychoeducation and/or Health Education and Link to [employer] a parent and  adolescent discussion about current family stress.  Encouraged [place] to identify and express emotions to her mother.  Discussed ways to access additional resources through Department of Social Services given Deanna Levine's needs. Standardized Assessments completed: Not Needed  Patient and/or Family Response: Deanna Levine was open to discussing emotions related to health difficulties.  She reports feeling "sad" with all the stressors in the family.  Her mother expressed that she worries Deanna Levine doesn't comprehend why she is unable to go to college at this moment.  Deanna Levine shared a basic understanding of the current family stress and reports feeling "disappointed" she can't go to college.  Assessment: Patient currently experiencing family stress related to Deanna Levine and her mother's health difficulties.  Deanna Levine will need surgery on her foot soon, which will take her out of work for 6 weeks.  Her family is working on a long term plan for her as she will need some support to live independently due to intellectual and learning problems.   Patient may benefit from continuing to use emotion words to process feelings about family stress.  She would also benefit from contacting Department of Social Services to determine eligibility for additional supports given financial situation in context of intellectual disabilities.  Plan: 1. Follow up with behavioral health clinician on : in approximately 2 months; Her mother will call to schedule this visit once there is a plan for her foot surgery 2. Dr. Mellody Dance will consult with Deanna Dacosta, LCSW and follow up about resources for her family to explore gaining guardianship 3. Behavioral recommendations: continue to eat smaller portions, speak openly with family about emotions during this stressful time 4. Referral(s): [place] ([facility]) and care coordinator 5. "From scale of 1-10, how likely are you to follow plan?":  likely  Burnett Sheng, PhD

## 2020-02-14 NOTE — Progress Notes (Signed)
S:     Chief Complaint  Patient presents with   Medication Management    Diabetes    Endocrinology provider: Dr. [name] (upcoming appt 04/25/19 9:00 AM)  Patient referred to me by Dr. [name] for closer DM management. PMH significant for T2DM, acanthosis nigricans, hydradenitis, petit mal without grand mal seizures, global developmental delay, dyspraxia, dyspepsia, obesity, mild intellectual disability, elevated TG, and anemia.She was seen on 12/04/19 to switch from [place] 2.0 to Dexcom G6 due to issues with adhesion as well as cost (FSL $75 vs Dexcom G6 $0)  At prior appt with patient on 01/17/20, Dexcom Clarity report showed patient had TIR 91%, <1% low, 0% very low likely due to Ozempic dosage increase 0.25 mg --> 0.5 mg as well as increase in exercise frequency. Patient was instructed to continue Ozempic 0.5 mg subQ once weekly. We had decided to slow down GLP-1 agonist titration considering her improvement in TIR and complaints of GI upset. Patient remained having significant pain in her feet. Advised patient she can continue to apply Voltaren 2g gel before work, when patient gets home from work, and 3 hours after patient get home from work (can apply every 3 hours if you would like; 16 g/day to any single joint of lower extremity (max: 8x per day). Advised her to STOP soaking in Epsom salt as there was no benefit and considering the risk of drying out skin on feet leading to open cuts/wounds.  Patient presents with mom. Tanee will have to have surgery 03/2020 regarding issues with tendons in her feet and provider anticipates it will take patient take 6 weeks for her feet to heal from surgery. Mother has reported she is concerned regarding cost of surgery/medical bills. Mom confirms that parents have claimed [place] as a dependent on insurance plan for 2022 year. Open enrollment for insurance plan has closed. Jesenya is unable to get insurance via her work place considering she is not a full time  employee at [employer] or [employer]. [name] thinking of applying for disability for Kiana in 03/2020. Patient remains having GI upset - diarrhea and stomach cramps on Ozempic 0.5 mg subQ once weekly. [name] now having issues with Dexcom G6 sensor connection and how Dexcom is stating her BG is low when it is not low. She has screenshotted 4 pictures when Dexcom was having issue. Pictures stated 1) Dexcom reading "Low" with arrow straight 2) 49 and decreasing (didn't feel low) 3) replace sensor now 4) sensor error. They have seen multiple sensor error messages. She has not double checked Dexcom with manual fingerstick.  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 She does not feel that Dexcom lows have been accurate. --Treats hypoglycemic episode with jolly ranchers  --Hypoglycemic symptoms: fatigue  Insurance Coverage: [facility] [place], [place] [facility] [place]  [address] [place], [place] [address]  Phone:  [phone] Fax:  [phone]  DEA #:  [id number]  Medication Adherence -Patient reports adherence with medications.  -Current diabetes medications include: Ozempic 0.5 mg subQ once weekly (initiated 12/04/19; injects on Tuesdays) -Prior diabetes medications include: metformin (switched to Victoza), Trulicity (painful), Humalog/Novolog (correction), Victoza (switched to Ozempic), Lantus (able to taper off when patient initiated on Ozempic)  Injection Sites  (no changes since prior pharmacy appt on 01/17/20) -Patient reports injection sites are abdomen, outer legs, back of arms, top of buttocks --Patient reports independently injecting DM medications. --Patient reports  rotating injection sites  Diet  (changes since prior pharmacy appt on 01/17/20); patient decreased from eating 2 meals/day to 1 meal/day Patient reported dietary habits:  Eats 1 meals/day and 0 snacks/day Breakfast (9AM):  skipping breakfast  Lunch (skips); (2-3 pm) baked chicken, fries; chicken, mashed potatoes, macaroni and cheese, green beans Dinner (7PM): skipping dinner Snacks:  glucerna occasionally Drinks:water, sugar free gatorade, sugar free orange twist, less milk/lactaid  Exercise  (changes since prior pharmacy appt on 12/11/19): Patient-reported exercise habits: Wed (physical therapy); exercises 1PM - 4PM daily (jumping jacks, push ups, sit ups), sometimes Zumba   Monitoring: Patient 2x episodes of nocturia (nighttime urination) each night.  Patient reports neuropathy (nerve pain) on top of legs, (not burning, just numb for ~5 min when she sits too long in the bathroom; resolves on its own) Patient denies visual changes. (Followed by ophthalmology (last seen 11/01/2019)) Patient reports self foot exams.  -Patient reports socks/slippers in the house and shoes outside.  -Patient reports monitoring for open wounds/cuts on her feet.   O:  Dexcom G6 Report    Days with CGM data: 34%  Sensor not connected 01/28/20 - 02/11/20  Labs:     There were no vitals filed for this visit.  Lab Results  Component Value Date   HGBA1C 6.6 (A) 12/11/2019   HGBA1C 6.3 (A) 09/20/2019   HGBA1C >14 05/17/2019    Lab Results  Component Value Date   CPEPTIDE 3.08 08/15/2017       Component Value Date/Time   CHOL 152 11/15/2018 1154   TRIG 63.0 11/15/2018 1154   HDL 38.20 (L) 11/15/2018 1154   CHOLHDL 4 11/15/2018 1154   VLDL 12.6 11/15/2018 1154   LDLCALC 101 (H) 11/15/2018 1154   LDLCALC 104 08/15/2017 0000    No results found for: MICRALBCREAT  Assessment:  DM medications - It is challenging to fully assess DM control considering patient has had issues with Dexcom G6  sensor connection. Anticipate BG tightly controlled considering Dexcom Clarity data from 01/18/20 to 01/27/20 show pt was tightly controlled with BG readings typically ranging 100-140 most of the time, she has increased her exercise frequency up to 3 hours daily, and she is eating less food (2 meals daily --> 1 meal daily). [name] remains having GI upset on Ozempic 0.5 mg weekly. Will decrease Ozempic 0.5 mg weekly --> Ozempic 0.25 mg weekly considering GI upset and significant decrease on appetite. Although Clarity report states patient is low it is unlikely considering she does not have any symptoms - will follow up on this and advise her to double check with manual fingerstick.  CGM issues - Will follow up with Dexcom regarding issues then follow up with patient.  Diet/Exercise - Mom is concerned pt will gain back weight due to anticipated limited mobility while recovering from surgery. Will place referral to our dietitian, Jean Rosenthal, to review healthy food choices while patient is at home recovering from surgery. Encouraged patient for her increase in exercise frequently! Discussed chair exercises for patient to do with her arms while she is recovering - will email family handout.  Insurance - Since family has already claimed [place] as a dependent on [employer] plan for 2022 year and open enrollment has closed for their insurance plan it is not possible  to change insurance at this time. However, I recommended patient go to [place] office in [facility] to discuss possibility of Krystian applying for [employer] for insurance for the 2023 year. Also, discussed how DSS may be able to discuss additional services available for patient. Family verbalized understanding.   Plan: 1. Medications: a. Decrease Ozempic 0.5 mg subQ once weekly (will do slower titration due to GI upset) --> Ozempic 0.25 mg subQ once weekly  2. Diet:  a. Will place referral to our dietitian, Jean Rosenthal, to  review healthy food choices while patient is at home recovering from surgery.  3. Exercise: a. Discussed chair exercises for patient to do with her arms while she is recovering - will email family handout 4. Insurance a. Follow up with DSS in [facility] (provided address/contact information) 5. BG Monitoring:  a. Continue using Dexcom G6 CGM (does NOT require PA via her insurance) b. Twanisha Foulk has a diagnosis of diabetes, checks blood glucose readings > 4x per day, and requires frequent adjustments to insulin regimen. This patient will be seen every six months, minimally, to assess adherence to their CGM regimen and diabetes treatment plan. 6. Follow Up: prn  Written patient instructions provided.    This appointment required 30 minutes of patient care (this includes precharting, chart review, review of results, face-to-face care, etc.).  Thank you for involving clinical pharmacist/diabetes educator to assist in providing this patient's care.  Drexel Iha, PharmD, CPP, CDCES

## 2020-02-14 NOTE — Progress Notes (Signed)
Office Visit Note   Patient: Deanna Levine           Date of Birth: 12-24-2000           MRN: [id number] Visit Date: 02/14/2020              Requested by: [name], MD [address] [place],  [place] [address] PCP: [name], MD  Chief Complaint  Patient presents with  . Right Ankle - Pain      HPI: Patient is a 19 year old woman who is seen with her parents for initial evaluation for bilateral forefoot pain.  Patient states that after being on her feet for 4 to 5 hours at work standing she has pain across the ball of her foot and then later on the day has pain with start up across the ball of her foot as well.  Patient has been seen by podiatry felt that she has a leg length inequality and a heel lift was placed in the right shoe.  Patient has been seen by Dr. Junius Roads and is seen in referral from Dr. Junius Roads for evaluation for a possible subtalar bar.  Patient's past medical history is significant for developmental delay as well as a history of seizures as a child patient did undergo physical therapy and Occupational Therapy and has type 2 diabetes.  Patient has had blood work obtained for possible autoimmune arthritic condition and these labs have been negative however patient does have anemia with an elevated sed rate.  Assessment & Plan:  Patient has severe equinus contracture bilaterally worse in the gastroc than the soleus muscles.  We will plan for bilateral gastrocnemius recessions to provide her a plantigrade foot for weightbearing.  Will make a referral for genetic counseling to [facility] for further investigation of the etiology of her seizures and contractures and learning developmental delay.  Visit Diagnoses:  1. Bilateral foot pain   2. Achilles tendon contracture, bilateral     Plan: Will make referral to [facility] for genetic testing.  Will require  Follow-Up Instructions: Return in about 2 weeks (around 02/28/2020).   Ortho  Exam  Patient is alert, oriented, no adenopathy, well-dressed, normal affect, normal respiratory effort. On examination patient has a plantigrade foot with ambulation external rotation worse on the right than the left with increased valgus alignment of the right knee than the left knee.  With patient standing the right leg is a little shorter secondary to the increased valgus of the right knee.  Patient can walk on her toes and heels she reconstitutes her arch with toe walking with no evidence of posterior tibial tendon insufficiency.  She does have decreased subtalar motion most likely due to the pes planus and valgus of both feet.  I reviewed the radiographs and radiographically there is not a subtalar bar.  Patient has severe equinus contracture bilaterally with her knee extended there is a 40 degree equinus contracture of the right and a 30 degree equinus contracture of the left her foot will go about 20 degrees past neutral with her knee flexed at 90 degrees.  Contracture primarily in the gastrocnemius muscle.  Imaging: No results found. No images are attached to the encounter.  Labs: Lab Results  Component Value Date   HGBA1C 6.6 (A) 12/11/2019   HGBA1C 6.3 (A) 09/20/2019   HGBA1C >14 05/17/2019   ESRSEDRATE CANCELED 02/07/2020   ESRSEDRATE 29 (H) 01/18/2020   ESRSEDRATE 33 (H) 10/09/2019   CRP 20.8 (H)  01/18/2020   CRP 26.7 (H) 10/09/2019   LABURIC 6.4 10/09/2019   REPTSTATUS 04/16/2008 FINAL 04/14/2008   CULT  04/14/2008    Multiple bacterial morphotypes present, none predominant. Suggest appropriate recollection if clinically indicated.     Lab Results  Component Value Date   ALBUMIN 4.4 03/05/2019   ALBUMIN 4.4 11/15/2018   ALBUMIN 4.6 05/06/2016   LABURIC 6.4 10/09/2019    No results found for: MG Lab Results  Component Value Date   VD25OH 26 (L) 10/09/2019   VD25OH 30 05/06/2016    No results found for: PREALBUMIN CBC EXTENDED Latest Ref Rng & Units 02/07/2020  03/05/2019 11/15/2018  WBC 3.8 - 10.8 Thousand/uL 8.4 8.1 8.0  RBC 3.80 - 5.10 Million/uL 5.15(H) 5.32(H) 4.99  HGB 11.7 - 15.5 g/dL 10.6(L) 11.3 10.5(L)  HCT 35.0 - 45.0 % 35.1 35.7 33.4(L)  PLT 140 - 400 Thousand/uL 379 182 334.0  NEUTROABS 1,500 - 7,800 cells/uL 5,527 - -  LYMPHSABS 850 - 3,900 cells/uL 2,461 - -     There is no height or weight on file to calculate BMI.  Orders:  Orders Placed This Encounter  Procedures  . Ambulatory referral to Genetics   No orders of the defined types were placed in this encounter.    Procedures: No procedures performed  Clinical Data: No additional findings.  ROS:  All other systems negative, except as noted in the HPI. Review of Systems  Objective: Vital Signs: There were no vitals taken for this visit.  Specialty Comments:  No specialty comments available.  PMFS History: Patient Active Problem List   Diagnosis Date Noted  . Elevated sedimentation rate 02/07/2020  . Pain in right ankle and joints of right foot 02/07/2020  . Anemia 12/29/2018  . Type 2 diabetes mellitus ([place]) 08/15/2017  . Elevated triglycerides with high cholesterol 12/09/2016  . Hydradenitis 09/07/2016  . Partial epilepsy with impairment of consciousness ([place]) 08/16/2012  . Mild intellectual disability 08/16/2012  . Laxity of ligament 08/16/2012  . Delayed milestones 08/16/2012  . Obesity 06/07/2011  . Dyspepsia 02/11/2011  . Acanthosis nigricans 02/11/2011  . Global developmental delay   . Petit mal without grand mal seizures ([place])   . Dyspraxia    Past Medical History:  Diagnosis Date  . Allergy   . Difficulty swallowing pills   . Dyspraxia   . Global developmental delay   . Obesity (BMI 30-39.9)   . Pre-diabetes   . Precocious female puberty   . Seizures ([place])    last seizure 2012 - is being weaned off med., will finish med. 06/08/2013    Family History  Problem Relation Age of Onset  . Diabetes Mother        steroid induced; hx.  colitis  . Anesthesia problems Mother        severe headache lasting 2-3 days  . Rheum arthritis Mother   . Ulcerative colitis Mother   . Fibromyalgia Mother   . Hypertension Maternal Grandmother   . Hyperlipidemia Maternal Grandmother   . Diabetes Maternal Grandmother   . Kidney disease Maternal Grandfather   . Hypertension Maternal Grandfather   . Heart disease Maternal Grandfather   . Hyperlipidemia Maternal Grandfather   . Kidney disease Paternal Grandmother   . Hypertension Paternal Grandmother   . Diabetes Paternal Grandmother   . Heart disease Paternal Grandmother   . Hyperlipidemia Paternal Grandmother   . Hypertension Paternal Grandfather   . Hyperlipidemia Paternal Grandfather   . Henoch-Schonlein purpura Brother  in remission  . Asthma Maternal Aunt   . Seizures Maternal Uncle     Past Surgical History:  Procedure Laterality Date  . [place] IMPLANT  05/06/2011   Procedure: SUPPRELIN IMPLANT;  Surgeon: Jerilynn Mages. Gerald Stabs, MD;  Location: [facility];  Service: Pediatrics;  Laterality: Right;  . SUPPRELIN IMPLANT Right 06/14/2013   Procedure: REMOVAL OF SUPPRELIN IMPLANT FROM RIGHT UPPER ARM;  Surgeon: Jerilynn Mages. Gerald Stabs, MD;  Location: [facility];  Service: Pediatrics;  Laterality: Right;  . TONSILLECTOMY AND ADENOIDECTOMY    . TYMPANOSTOMY TUBE PLACEMENT     x 2   Social History   Occupational History  . 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 Smoking status: Never Smoker  . Smokeless tobacco: Never Used  Vaping Use  . Vaping Use: Never used  Substance and Sexual Activity  . Alcohol use: No    Alcohol/week: 0.0 standard drinks  . Drug use: No  . Sexual activity: Never

## 2020-02-14 NOTE — Patient Instructions (Addendum)
It was a pleasure seeing you today!  Today the plan is ... 1) I will follow up with Dexcom about issues you have been having 2) Decrease Ozempic 0.5 mg subQ weekly --> Ozempic 0.25 mg subQ weekly 3) I will put in referral to our dietitian Estanislado Spire so you can get an appointment with her 4) I will email you chair exercises 5) I would talk to [facility] [place] (info below) Address: [address] [place], [place] [address] [address] [place], [place] [address]   [id number], [place] [address] State Courier #: [id number] Phone: [phone] Fax Number: [phone] Emergency Phone: [phone] Website  Please call me at [phone] with any questions/concerns

## 2020-02-15 NOTE — Patient Instructions (Addendum)
1. Care Manager: Call your insurance company to inquire about a Transport planner for [place]  2. DSS services: Please contact the [place] to inquire about additional resources for [place] given her intellectual disability [place] [address] [phone]   3. Innovations Waiver: Here is the website to apply for an Tax adviser for [place]: [email or web address]://[email or web address]  4. Guardianship: Here are some resources for guardianship:  [facility] [place]  Social Workers provide comprehensive ongoing case management services to adults that have been adjudicated incompetent by the [employer] and for whom the Market researcher has been appointed legal guardian.  Websites: http://roberts.info/  Guardianship forms (application will need to be submitted to the [facility] for [facility] residents and for other counties, in their respective county court house.     [place], [place] [address]  Telephone  Main  [phone]     If you need to talk to a lawyer - [facility] can also assist you in talking to a lawyer for a free 15 minute consultation.     [facility]  [address], [place], [place] [address]   Phone: [phone]

## 2020-02-18 ENCOUNTER — Encounter (INDEPENDENT_AMBULATORY_CARE_PROVIDER_SITE_OTHER): Payer: Self-pay

## 2020-02-21 ENCOUNTER — Ambulatory Visit (INDEPENDENT_AMBULATORY_CARE_PROVIDER_SITE_OTHER): Payer: 59 | Admitting: Pharmacist

## 2020-02-26 ENCOUNTER — Telehealth: Payer: Self-pay | Admitting: Orthopedic Surgery

## 2020-02-26 ENCOUNTER — Encounter: Payer: Self-pay | Admitting: Orthopedic Surgery

## 2020-02-26 NOTE — Telephone Encounter (Signed)
Patient's mom calling  [name]) regarding a surgery date for her daughter Deanna Levine.(stretching tendons in both legs). She would like to go ahead and schedule something in the month of January. If nothing is available in January, the 2nd week in Feb can work.  Mom will be having an infusion on the 3rd of Feb and another procedure on the 7th.  Mom will be the primary caregiver and would like to coordinate as soon as possible.     [phone]

## 2020-03-05 ENCOUNTER — Ambulatory Visit: Payer: 59 | Admitting: Internal Medicine

## 2020-03-05 ENCOUNTER — Ambulatory Visit (INDEPENDENT_AMBULATORY_CARE_PROVIDER_SITE_OTHER): Payer: 59 | Admitting: Psychology

## 2020-03-13 ENCOUNTER — Encounter: Payer: Self-pay | Admitting: Internal Medicine

## 2020-03-26 ENCOUNTER — Ambulatory Visit: Payer: 59 | Admitting: Registered"

## 2020-03-27 ENCOUNTER — Other Ambulatory Visit: Payer: Self-pay

## 2020-04-14 ENCOUNTER — Other Ambulatory Visit: Payer: Self-pay | Admitting: Physician Assistant

## 2020-04-19 NOTE — Progress Notes (Signed)
S:     Chief Complaint  Patient presents with  . Diabetes    Education    Endocrinology provider: Dr. [name] (upcoming appt 04/25/19 9:00 AM)  Patient referred to me by Dr. [name] for closer DM management. PMH significant for T2DM, acanthosis nigricans, hydradenitis, petit mal without grand mal seizures, global developmental delay, dyspraxia, dyspepsia, obesity, mild intellectual disability, elevated TG, and anemia.She was seen on 12/04/19 to switch from [place] 2.0 to Dexcom G6 due to issues with adhesion as well as cost (FSL $75 vs Dexcom G6 $0)  At prior appt with patient on 01/17/20, Dexcom Clarity report showed patient had TIR 91%, <1% low, 0% very low likely due to Ozempic dosage increase 0.25 mg --> 0.5 mg as well as increase in exercise frequency. Patient was instructed to continue Ozempic 0.5 mg subQ once weekly. We had decided to slow down GLP-1 agonist titration considering her improvement in TIR and complaints of GI upset. Patient remained having significant pain in her feet. Advised patient she can continue to apply Voltaren 2g gel before work, when patient gets home from work, and 3 hours after patient get home from work (can apply every 3 hours if you would like; 16 g/day to any single joint of lower extremity (max: 8x per day). Advised her to STOP soaking in Epsom salt as there was no benefit and considering the risk of drying out skin on feet leading to open cuts/wounds.  At prior appt on 02/14/20, patient's TIR 98%, low 1%, very low <1%. Patient was having GI upset with Ozempic 0.5 subQ once weekly. Ozempic was decreased to 0.25 subQ once weekly considering TIR and GI upset. Patient was having issues with Dexcom G6 sensors and took pictures of error messages; pictures stated 1) Dexcom reading "Low" with arrow straight 2) 49 and decreasing (didn't feel low) 3) replace sensor now 4) sensor error. Patient was advised to contact Dexcom. She also was advised to calibrate if Dexcom was  inaccurate. Patient also discussed with me that she was to have surgery 03/2020 regarding issues with tendons in her feet and provider anticipated it will take patient take 6 weeks for her feet to heal from surgery.   Patient presents today for follow up appointment. She will has surgery on her leg soon. [name] interested in restarting her Dexcom today, but she forgot her login information. She has not been monitoring her BG readings.  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 She does not feel that Dexcom lows have been accurate. --Treats hypoglycemic episode with jolly ranchers  --Hypoglycemic symptoms: fatigue  Insurance Coverage: Public affairs consultant [facility] [place] [facility] [place]  [address] [place], [place] [address]  Phone:  [phone] Fax:  [phone]  DEA #:  [id number]  Medication Adherence -Patient reports adherence with medications.  -Current diabetes medications include: Ozempic 0.25 mg subQ once weekly (initiated 12/04/19; injects on Tuesdays) -Prior diabetes medications include: metformin (switched to Victoza), Trulicity (painful), Humalog/Novolog (correction), Victoza (switched to Ozempic), Lantus (able to taper off when patient initiated on Ozempic)  Injection Sites  (no changes since prior pharmacy appt on 02/14/20) -Patient reports injection sites are abdomen, outer legs, back of arms, top of buttocks --Patient reports independently injecting  DM medications. --Patient reports rotating injection sites  Diet  (no changes since prior pharmacy appt on 02/14/20) Patient reported dietary habits:  Eats 1 meals/day and 0 snacks/day Breakfast (9AM):   skipping breakfast  Lunch (skips); (2-3 pm) baked chicken, fries; chicken, mashed potatoes, macaroni and cheese, green beans Dinner (7PM): skipping dinner Snacks:  glucerna occasionally Drinks:water, sugar free gatorade, sugar free orange twist, less milk/lactaid  Exercise (no changes since prior pharmacy appt on 02/14/20) Patient-reported exercise habits: Wed (physical therapy); exercises 1PM - 4PM daily (jumping jacks, push ups, sit ups), sometimes Zumba   Monitoring: Patient denies episodes of nocturia (nighttime urination) each night.  Patient denies neuropathy (nerve pain)  Patient denies visual changes. (Followed by ophthalmology (last seen 11/01/2019)) Patient denies self foot exams; no open wounds/cuts on her feet.   O:  Dexcom G6 Report - Not wearing  Labs:     Vitals:   04/24/20 0934  BP: (!) 128/59    Lab Results  Component Value Date   HGBA1C 6.2 04/24/2020   HGBA1C 6.6 (A) 12/11/2019   HGBA1C 6.3 (A) 09/20/2019    Lab Results  Component Value Date   CPEPTIDE 3.08 08/15/2017       Component Value Date/Time   CHOL 152 11/15/2018 1154   TRIG 63.0 11/15/2018 1154   HDL 38.20 (L) 11/15/2018 1154   CHOLHDL 4 11/15/2018 1154   VLDL 12.6 11/15/2018 1154   LDLCALC 101 (H) 11/15/2018 1154   LDLCALC 104 08/15/2017 0000    No results found for: MICRALBCREAT  Assessment: Although patient is not wearing Dexcom her A1c has decreased from 6.6% --> 6.2%. She does not complain of hypoglycemia. Encouraged patient for her success! Assisted patient with applying new Dexcom G6 sensor/transmitter today. Discussed with patient after she has her surgery it is possible her BG readings may increase considering she will be unable to walk/do any physical activity. We may have to adjust her DM medications while she is recovering. I advised her if she starts noticing BG readings > 200 mg/dL consistently to send me a MyChart message or call me for further guidance in between seeing  Dr. [name]. Continue wearing Dexcom G6 CGM. Follow up prn.   Plan: 1. Medications: a. Continue Ozempic 0.25 mg subQ once weekly  2. Monitoring:  a. Continue using Dexcom G6 CGM (does NOT require PA via her insurance) b. Natosha Bou has a diagnosis of diabetes, checks blood glucose readings > 4x per day, and requires frequent adjustments to insulin regimen. This patient will be seen every six months, minimally, to assess adherence to their CGM regimen and diabetes treatment plan. 3. Follow Up: prn  Written patient instructions provided.    This appointment required 30 minutes of patient care (this includes precharting, chart review, review of results, face-to-face care, etc.).  Thank you for involving clinical pharmacist/diabetes educator to assist in providing this patient's care.  Drexel Iha, PharmD, CPP, CDCES

## 2020-04-23 ENCOUNTER — Encounter (HOSPITAL_COMMUNITY): Payer: Self-pay | Admitting: Orthopedic Surgery

## 2020-04-23 ENCOUNTER — Other Ambulatory Visit: Payer: Self-pay

## 2020-04-23 ENCOUNTER — Other Ambulatory Visit (HOSPITAL_COMMUNITY)
Admission: RE | Admit: 2020-04-23 | Discharge: 2020-04-23 | Disposition: A | Discharge status: Home / Self Care | Payer: 59 | Source: Ambulatory Visit | Attending: Orthopedic Surgery | Admitting: Orthopedic Surgery

## 2020-04-23 DIAGNOSIS — Z20822 Contact with and (suspected) exposure to covid-19: Secondary | ICD-10-CM | POA: Diagnosis not present

## 2020-04-23 DIAGNOSIS — Z01812 Encounter for preprocedural laboratory examination: Secondary | ICD-10-CM | POA: Insufficient documentation

## 2020-04-23 LAB — SARS CORONAVIRUS 2 (TAT 6-24 HRS): SARS Coronavirus 2: NEGATIVE

## 2020-04-23 NOTE — Progress Notes (Signed)
Spoke with patient Deanna Levine and Mother Deanna Levine via speaker phone to obtain PAT information for DOS.  PCP - Dr Pricilla Holm Cardiologist - n/a  Chest x-ray - n/a EKG - n/a Stress Test - n/a ECHO - n/a Cardiac Cath - n/a  Fasting Blood Sugar - 120s Checks Blood Sugar  0  times a day  Patient has a Dexcom system, Type 2 DM - System is broken, unable to check.  Patient will be getting something to check CBGs but currently not able to check CBGs..   ERAS: Clear liquids til 4:30 am day of surgery  Anesthesia review: Yes  STOP now taking any Aspirin (unless otherwise instructed by your surgeon), Aleve, Naproxen, Ibuprofen, Motrin, Advil, Goody's, BC's, all herbal medications, fish oil, and all vitamins.   Coronavirus Screening Covid test on 04/23/20 was negative. Do you have any of the following symptoms:  Cough yes/no: No Fever (>100.58F)  yes/no: No Runny nose yes/no: No Sore throat yes/no: No Difficulty breathing/shortness of breath  yes/no: No  Have you traveled in the last 14 days and where? yes/no: No  Patient Deanna Levine and Mother Deanna Levine verbalized understanding of instructions that were given via phone.

## 2020-04-24 ENCOUNTER — Ambulatory Visit (INDEPENDENT_AMBULATORY_CARE_PROVIDER_SITE_OTHER): Payer: 59 | Admitting: Pharmacist

## 2020-04-24 ENCOUNTER — Telehealth (INDEPENDENT_AMBULATORY_CARE_PROVIDER_SITE_OTHER): Payer: 59 | Admitting: Pediatric Endocrinology

## 2020-04-24 ENCOUNTER — Encounter (INDEPENDENT_AMBULATORY_CARE_PROVIDER_SITE_OTHER): Payer: Self-pay | Admitting: Pediatric Endocrinology

## 2020-04-24 VITALS — BP 128/59 | HR 87 | Wt 215.6 lb

## 2020-04-24 VITALS — BP 128/59 | Wt 215.6 lb

## 2020-04-24 DIAGNOSIS — E1165 Type 2 diabetes mellitus with hyperglycemia: Secondary | ICD-10-CM

## 2020-04-24 DIAGNOSIS — Z794 Long term (current) use of insulin: Secondary | ICD-10-CM

## 2020-04-24 DIAGNOSIS — L732 Hidradenitis suppurativa: Secondary | ICD-10-CM

## 2020-04-24 LAB — POCT GLYCOSYLATED HEMOGLOBIN (HGB A1C): HbA1c, POC (controlled diabetic range): 6.2 % (ref 0.0–7.0)

## 2020-04-24 LAB — POCT GLUCOSE (DEVICE FOR HOME USE): Glucose Fasting, POC: 137 mg/dL — AB (ref 70–99)

## 2020-04-24 NOTE — Patient Instructions (Addendum)
Continue Ozempic at current dose.   Schedule follow up with Dr. Mellody Dance.

## 2020-04-24 NOTE — Progress Notes (Addendum)
This is a Pediatric Specialist E-Visit follow up consult provided via [place] and their parent/guardian Deanna Levine consented to an E-Visit consult today.  Location of patient: [place] is at Pediatric Specialites office (location) Location of provider: [place] is at home (location) Patient was referred by [name], *   The following participants were involved in this E-Visit: Mom, Deanna Levine, Dr [name] (list of participants and their roles)  Chief Complain/ Reason for E-Visit today: type 2 diabetes Total time on call: 25 min Follow up: 3 months     Subjective:  Subjective  Patient Name: Deanna Levine Date of Birth: [birth year]  MRN: [id number]  Deanna Levine  presents to clinic today for follow-up evaluation and management of her  obesity, prediabetes, acanthosis   HISTORY OF PRESENT ILLNESS:   Deanna Levine is a 20 y.o. [place] female   Deanna Levine is accompanied by her mother  1. Deanna Levine was initially followed in pediatric endocrine clinic for early puberty complicated by profound developmental delay. [name] now post menarchal and follows in clinic for Type 2 Diabetes and metabolic syndrome.    2. The patient's last [facility] visit was on 01/17/20 .  [name] scheduled to have surgery tomorrow on her legs. She won't be able to walk for 6 weeks. She says that she will be able to use a walker- but only from bed to bathroom to recliner. [name] getting her tendons stretched in both legs.   [name] unsure how her sugar has been recently because she got a new phone and did not remember her Dexcom login information. [name] meeting with Dr. Lovena Le today to get everything set back up.   [name] now using Ozempic 0.25 mg weekly. [name] eating 3 meals a day. She was not eating when she was taking the higher doses.   She says that sometimes she has some pain. She has had some stomach cramps and some diarrhea- but not every day.   She has been drinking water, sugar free  twist and gatorade zero. She also drinks some milk.   Her energy level has been good. [name] working at [employer] and [employer].   Mom says that she is tired after a 4 hour work shift. [name] snacking more again than she was. She doesn't always tell mom when she snacks.    [name] still having issues with the sores on her chest. [name] using Benzaclin- she stays that it is helping "a little". [name] using it only once the sore forms.   She has not had any further seizure activity and they have not yet redone the 2 day EEG.   [name] not doing a lot of exercise right now.   3. Pertinent Review of Systems:  Constitutional: The patient feels "good". The patient seems healthy and active. Eyes: Vision seems to be good. There are no recognized eye problems. Wears glasses.  Neck: The patient has no complaints of anterior neck swelling, soreness, tenderness, pressure, discomfort, or difficulty swallowing.   Heart: Heart rate increases with exercise or other physical activity. The patient has no complaints of palpitations, irregular heart beats, chest pain, or chest pressure.   Lungs: no asthma or wheezing.  Gastrointestinal: Bowel movents seem normal. The patient has no complaints of excessive hunger, acid reflux, upset stomach, stomach aches or pains, diarrhea. Some constipation.  Legs: Muscle mass and strength seem normal. There are no complaints of numbness, tingling, burning, or pain. No edema  is noted.  Feet: Per HPI Neurologic: Has been having staring spells. [name] followed by [name] in adult neurology GYN/GU: Menarche 07/2014, age 59. LMP 04/18/20. [name] no longer having issues with heavy periods. Skin: continued issues with hydradenitis and multiple abscesses- mostly axillary - now also up under her breasts.  Psych: She was meeting with Dr. Mellody Dance- but has not rescheduled since she has been back from Maternity leave.    Glucose review:  She has not had her Dexcom on since Thanksgiving.       PAST MEDICAL, FAMILY, AND SOCIAL HISTORY  Past Medical History:  Diagnosis Date  . Allergy   . Complication of anesthesia    slow to wake up from anesthesia, occ HA with anesthesia   . Diabetes mellitus without complication ([place])    type 2  . Difficulty swallowing pills   . Dyspraxia   . Global developmental delay   . Obesity (BMI 30-39.9)   . PONV (postoperative nausea and vomiting)   . Precocious female puberty   . Seizures ([place])    last seizure 2012 - no current med    Family History  Problem Relation Age of Onset  . Diabetes Mother        steroid induced; hx. colitis  . Anesthesia problems Mother        severe headache lasting 2-3 days  . Rheum arthritis Mother   . Ulcerative colitis Mother   . Fibromyalgia Mother   . Hypertension Maternal Grandmother   . Hyperlipidemia Maternal Grandmother   . Diabetes Maternal Grandmother   . Kidney disease Maternal Grandfather   . Hypertension Maternal Grandfather   . Heart disease Maternal Grandfather   . Hyperlipidemia Maternal Grandfather   . Kidney disease Paternal Grandmother   . Hypertension Paternal Grandmother   . Diabetes Paternal Grandmother   . Heart disease Paternal Grandmother   . Hyperlipidemia Paternal Grandmother   . Hypertension Paternal Grandfather   . Hyperlipidemia Paternal Grandfather   . Henoch-Schonlein purpura Brother        in remission  . Asthma Maternal Aunt   . Seizures Maternal Uncle      Current Outpatient Medications:  .  ACCU-CHEK GUIDE test strip, USE TO TEST BLOOD SUGAR LEVELS 4 TIMES DAILY, Disp: 150 strip, Rfl: 5 .  Accu-Chek Softclix Lancets lancets, Use to test up to 6x daily, Disp: 200 each, Rfl: 5 .  blood glucose meter kit and supplies, Dispense based on patient and insurance preference. Use up to four times daily as directed. (FOR ICD-10 E10.9, E11.9)., Disp: 1 each, Rfl: 0 .  Blood Glucose Monitoring Suppl (ONETOUCH VERIO) w/Device KIT, 1 kit by Does not apply route as  directed. Use glucometer to check sugar up to 3x per day (E11.9), Disp: 1 kit, Rfl: 3 .  cetirizine (ZYRTEC) 10 MG tablet, Take 10 mg by mouth daily., Disp: , Rfl:  .  cholecalciferol (VITAMIN D3) 25 MCG (1000 UNIT) tablet, Take 1,000 Units by mouth daily., Disp: , Rfl:  .  clindamycin-benzoyl peroxide (BENZACLIN) gel, Apply topically 2 (two) times daily. (Patient taking differently: Apply 1 application topically 2 (two) times daily as needed (acne).), Disp: 25 g, Rfl: 0 .  Continuous Blood Gluc Receiver (DEXCOM G6 RECEIVER) DEVI, 1 Device by Does not apply route as directed., Disp: 1 each, Rfl: 2 .  Continuous Blood Gluc Sensor (DEXCOM G6 SENSOR) MISC, Inject 1 applicator into the skin as directed. (change sensor every 10 days), Disp: 3 each, Rfl:  11 .  Continuous Blood Gluc Transmit (DEXCOM G6 TRANSMITTER) MISC, Inject 1 Device into the skin as directed. (re-use up to 8x with each new sensor), Disp: 1 each, Rfl: 3 .  diclofenac Sodium (VOLTAREN) 1 % GEL, Apply to right foot before work and after work for 60 days. (Patient taking differently: Apply 1 application topically See admin instructions. Apply to right foot before work and after work for 60 days.), Disp: 350 g, Rfl: 1 .  Menaquinone-7 (VITAMIN K2) 100 MCG CAPS, Take 100 mcg by mouth daily., Disp: , Rfl:  .  Multiple Vitamins-Minerals (MULTIVITAMIN ADULT) CHEW, Chew 1 tablet by mouth daily., Disp: , Rfl:  .  olopatadine (PATANOL) 0.1 % ophthalmic solution, Place 1 drop into both eyes 2 (two) times daily as needed for allergies., Disp: , Rfl:  .  Semaglutide,0.25 or 0.5MG/DOS, (OZEMPIC, 0.25 OR 0.5 MG/DOSE,) 2 MG/1.5ML SOPN, Inject 0.25 mg into the skin once a week., Disp: 1.5 mL, Rfl: 11 .  triamcinolone (NASACORT) 55 MCG/ACT AERO nasal inhaler, Place 2 sprays into the nose daily as needed (allergies)., Disp: , Rfl:   Allergies as of 04/24/2020  . (No Known Allergies)     reports that she has never smoked. She has never used smokeless  tobacco. She reports that she does not drink alcohol and does not use drugs. Pediatric History  Patient Parents  . Levora, Werden (Mother)  . Bratz Jr,Clifton (Father)   Other Topics Concern  . Not on file  Social History Narrative   Kevia is graduated. [name] doing average.    She enjoys playing softball and basketball.   Lives with her parents.    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    Left handed   Two story home   Working at [employer] and at [employer]. Mom makes her keep a budget and pay her own bills.   Primary Care Provider: [name], MD     Objective:  Objective  Vital Signs:   BP (!) 128/59   Pulse 87   Wt 215 lb 9.6 oz (97.8 kg)   LMP 04/18/2020 (Exact Date)   BMI 37.01 kg/m  Growth percentile SmartLinks can only be used for patients less than 42 years old.    Ht Readings from Last 3 Encounters:  02/07/20 _0  (1.651 m) (61 %, Z= 0.27)*  10/08/19 _1  (1.6 m) (31 %, Z= -0.51)*  09/19/19 5' 4.17" (1.63 m) (48 %, Z= -0.05)*   * Growth percentiles are based on CDC (Girls, 2-20 Years) data.   Wt Readings from Last 3 Encounters:  04/24/20 215 lb 9.8 oz (97.8 kg)  04/24/20 215 lb 9.6 oz (97.8 kg)  02/07/20 215 lb (97.5 kg) (98 %, Z= 2.15)*   * Growth percentiles are based on CDC (Girls, 2-20 Years) data.   HC Readings from Last 3 Encounters:  No data found for [facility]   Body surface area is 2.1 meters squared. Facility age limit for growth percentiles is 20 years. Facility age limit for growth percentiles is 20 years.   PHYSICAL EXAM: Virtual visit.   No distress Normal oral moisture Normal work of breathing Some darkening of the neck visible No goiter visible.  Good movements of extremities    LAB DATA:    Lab Results  Component Value Date   HGBA1C 6.2 04/24/2020   HGBA1C 6.6 (A) 12/11/2019   HGBA1C 6.3 (A) 09/20/2019   HGBA1C >14 05/17/2019   HGBA1C 6.6 (A) 12/29/2018  HGBA1C 6.7 (H) 11/15/2018    HGBA1C 6.5 (A) 02/21/2018   HGBA1C 6.5 (A) 11/15/2017     Results for orders placed or performed in visit on 04/24/20  POCT Glucose (Device for Home Use)  Result Value Ref Range   Glucose Fasting, POC 137 (A) 70 - 99 mg/dL   POC Glucose    POCT glycosylated hemoglobin (Hb A1C)  Result Value Ref Range   Hemoglobin A1C     HbA1c POC (<> result, manual entry)     HbA1c, POC (prediabetic range)     HbA1c, POC (controlled diabetic range) 6.2 0.0 - 7.0 %         Assessment and Plan:  Assessment  ASSESSMENT:   Yaffa is a 20 y.o. AA female with history of precocious puberty (which increases risk of W7PX, PCOS, Metabolic syndrome) who now meets criteria for type 2 diabetes   Type 2 diabetes  - Diagnosed in January 2021 - Has been working closely with Dr. Lovena Le to improve glycemic control - Needs to restart Dexcom - Continues on low dose Ozempic - Good reduction in A1C   Hydradenitis  - Has improved with improved glucose control - Using PRN Benzaclin - Discussed today but not visualized due to virtual visit.   PLAN:    1. Diagnostic: A1C as above  2. Therapeutic: Meds 3. Patient education: Lengthy discussion as above. Discussed need for upper body exercise even when she is not allowed to walk post op.  4. Follow-up: Return in about 3 months (around 07/22/2020).      Lelon Huh, MD  Level of Service:  >30 minutes spent today reviewing the medical chart, counseling the patient/family, and documenting today's encounter. When a patient is on insulin, intensive monitoring of blood glucose levels is necessary to avoid hyperglycemia and hypoglycemia. Severe hyperglycemia/hypoglycemia can lead to hospital admissions and be life threatening.    Addendum This visit was completed via [place] for a video visit.   Lelon Huh, MD

## 2020-04-25 ENCOUNTER — Other Ambulatory Visit: Payer: Self-pay

## 2020-04-25 ENCOUNTER — Telehealth: Payer: Self-pay | Admitting: Orthopedic Surgery

## 2020-04-25 ENCOUNTER — Ambulatory Visit (HOSPITAL_COMMUNITY): Payer: 59 | Admitting: Certified Registered"

## 2020-04-25 ENCOUNTER — Other Ambulatory Visit: Payer: Self-pay | Admitting: Orthopedic Surgery

## 2020-04-25 ENCOUNTER — Telehealth: Payer: Self-pay | Admitting: *Deleted

## 2020-04-25 ENCOUNTER — Ambulatory Visit (HOSPITAL_COMMUNITY)
Admission: RE | Admit: 2020-04-25 | Discharge: 2020-04-25 | Disposition: A | Discharge status: Home / Self Care | Payer: 59 | Attending: Orthopedic Surgery | Admitting: Orthopedic Surgery

## 2020-04-25 ENCOUNTER — Encounter (HOSPITAL_COMMUNITY)
Admission: RE | Disposition: A | Discharge status: Home / Self Care | Payer: Self-pay | Source: Home / Self Care | Attending: Orthopedic Surgery

## 2020-04-25 ENCOUNTER — Encounter (HOSPITAL_COMMUNITY): Payer: Self-pay | Admitting: Orthopedic Surgery

## 2020-04-25 DIAGNOSIS — M6702 Short Achilles tendon (acquired), left ankle: Secondary | ICD-10-CM | POA: Insufficient documentation

## 2020-04-25 DIAGNOSIS — Z820 Family history of epilepsy and other diseases of the nervous system: Secondary | ICD-10-CM | POA: Diagnosis not present

## 2020-04-25 DIAGNOSIS — Z79899 Other long term (current) drug therapy: Secondary | ICD-10-CM | POA: Insufficient documentation

## 2020-04-25 DIAGNOSIS — Z8379 Family history of other diseases of the digestive system: Secondary | ICD-10-CM | POA: Insufficient documentation

## 2020-04-25 DIAGNOSIS — Z825 Family history of asthma and other chronic lower respiratory diseases: Secondary | ICD-10-CM | POA: Diagnosis not present

## 2020-04-25 DIAGNOSIS — M6701 Short Achilles tendon (acquired), right ankle: Secondary | ICD-10-CM | POA: Diagnosis not present

## 2020-04-25 DIAGNOSIS — E119 Type 2 diabetes mellitus without complications: Secondary | ICD-10-CM | POA: Insufficient documentation

## 2020-04-25 DIAGNOSIS — Z833 Family history of diabetes mellitus: Secondary | ICD-10-CM | POA: Diagnosis not present

## 2020-04-25 DIAGNOSIS — Z791 Long term (current) use of non-steroidal anti-inflammatories (NSAID): Secondary | ICD-10-CM | POA: Insufficient documentation

## 2020-04-25 DIAGNOSIS — Z8261 Family history of arthritis: Secondary | ICD-10-CM | POA: Diagnosis not present

## 2020-04-25 DIAGNOSIS — Z8349 Family history of other endocrine, nutritional and metabolic diseases: Secondary | ICD-10-CM | POA: Insufficient documentation

## 2020-04-25 DIAGNOSIS — Z8249 Family history of ischemic heart disease and other diseases of the circulatory system: Secondary | ICD-10-CM | POA: Insufficient documentation

## 2020-04-25 DIAGNOSIS — R6250 Unspecified lack of expected normal physiological development in childhood: Secondary | ICD-10-CM | POA: Diagnosis not present

## 2020-04-25 HISTORY — DX: Type 2 diabetes mellitus without complications: E11.9

## 2020-04-25 HISTORY — PX: GASTROCNEMIUS RECESSION: SHX863

## 2020-04-25 HISTORY — DX: Other complications of anesthesia, initial encounter: T88.59XA

## 2020-04-25 HISTORY — DX: Other specified postprocedural states: Z98.890

## 2020-04-25 HISTORY — DX: Nausea with vomiting, unspecified: R11.2

## 2020-04-25 LAB — BASIC METABOLIC PANEL
Anion gap: 11 (ref 5–15)
BUN: 16 mg/dL (ref 6–20)
CO2: 23 mmol/L (ref 22–32)
Calcium: 9.8 mg/dL (ref 8.9–10.3)
Chloride: 105 mmol/L (ref 98–111)
Creatinine, Ser: 0.6 mg/dL (ref 0.44–1.00)
GFR, Estimated: >60 mL/min (ref >60)
Glucose, Bld: 117 mg/dL — ABNORMAL HIGH (ref 70–99)
Potassium: 3.7 mmol/L (ref 3.5–5.1)
Sodium: 139 mmol/L (ref 135–145)

## 2020-04-25 LAB — CBC
HCT: 35.4 % — ABNORMAL LOW (ref 36.0–46.0)
Hemoglobin: 11 g/dL — ABNORMAL LOW (ref 12.0–15.0)
MCH: 21.6 pg — ABNORMAL LOW (ref 26.0–34.0)
MCHC: 31.1 g/dL (ref 30.0–36.0)
MCV: 69.4 fL — ABNORMAL LOW (ref 80.0–100.0)
Platelets: 334 10*3/uL (ref 150–400)
RBC: 5.1 MIL/uL (ref 3.87–5.11)
RDW: 17.4 % — ABNORMAL HIGH (ref 11.5–15.5)
WBC: 8.5 10*3/uL (ref 4.0–10.5)
nRBC: 0 % (ref 0.0–0.2)

## 2020-04-25 LAB — GLUCOSE, CAPILLARY
Glucose-Capillary: 116 mg/dL — ABNORMAL HIGH (ref 70–99)
Glucose-Capillary: 147 mg/dL — ABNORMAL HIGH (ref 70–99)

## 2020-04-25 LAB — POCT PREGNANCY, URINE: Preg Test, Ur: NEGATIVE

## 2020-04-25 SURGERY — RECESSION, MUSCLE, GASTROCNEMIUS
Anesthesia: General | Site: Leg Lower | Laterality: Bilateral

## 2020-04-25 MED ORDER — PROPOFOL 10 MG/ML IV BOLUS
INTRAVENOUS | Status: AC
  Filled 2020-04-25: qty 20

## 2020-04-25 MED ORDER — HYDROCODONE-ACETAMINOPHEN 7.5-325 MG/15ML PO SOLN: 15.0000 mL | Freq: Four times a day (QID) | ORAL | 0 refills | Status: DC | PRN

## 2020-04-25 MED ORDER — 0.9 % SODIUM CHLORIDE (POUR BTL) OPTIME
TOPICAL | Status: DC | PRN
  Administered 2020-04-25: 1000 mL

## 2020-04-25 MED ORDER — DEXAMETHASONE SODIUM PHOSPHATE 10 MG/ML IJ SOLN
INTRAMUSCULAR | Status: AC
  Filled 2020-04-25: qty 2

## 2020-04-25 MED ORDER — CEFAZOLIN SODIUM-DEXTROSE 2-4 GM/100ML-% IV SOLN
2.0000 g | INTRAVENOUS | Status: AC
  Administered 2020-04-25: 2 g via INTRAVENOUS
  Filled 2020-04-25: qty 100

## 2020-04-25 MED ORDER — OXYCODONE HCL 5 MG PO TABS: 5.0000 mg | ORAL_TABLET | Freq: Once | ORAL | Status: DC | PRN

## 2020-04-25 MED ORDER — BUPIVACAINE HCL (PF) 0.25 % IJ SOLN
INTRAMUSCULAR | Status: AC
  Filled 2020-04-25: qty 10

## 2020-04-25 MED ORDER — PROPOFOL 10 MG/ML IV BOLUS
INTRAVENOUS | Status: DC | PRN
  Administered 2020-04-25: 170 mg via INTRAVENOUS

## 2020-04-25 MED ORDER — HYDROCODONE-ACETAMINOPHEN 7.5-325 MG/15ML PO SOLN
15.0000 mL | Freq: Four times a day (QID) | ORAL | 0 refills | Status: DC | PRN
Start: 2020-04-25 — End: 2020-04-25

## 2020-04-25 MED ORDER — FENTANYL CITRATE (PF) 100 MCG/2ML IJ SOLN
25.0000 ug | INTRAMUSCULAR | Status: DC | PRN
  Administered 2020-04-25 (×4): 25 ug via INTRAVENOUS

## 2020-04-25 MED ORDER — FENTANYL CITRATE (PF) 250 MCG/5ML IJ SOLN
INTRAMUSCULAR | Status: DC | PRN
  Administered 2020-04-25: 50 ug via INTRAVENOUS

## 2020-04-25 MED ORDER — ONDANSETRON HCL 4 MG/2ML IJ SOLN
INTRAMUSCULAR | Status: AC
  Filled 2020-04-25: qty 4

## 2020-04-25 MED ORDER — ONDANSETRON HCL 4 MG/2ML IJ SOLN
INTRAMUSCULAR | Status: AC
  Filled 2020-04-25: qty 2

## 2020-04-25 MED ORDER — ONDANSETRON HCL 4 MG/2ML IJ SOLN
INTRAMUSCULAR | Status: DC | PRN
  Administered 2020-04-25: 4 mg via INTRAVENOUS

## 2020-04-25 MED ORDER — LIDOCAINE HCL (CARDIAC) PF 100 MG/5ML IV SOSY
PREFILLED_SYRINGE | INTRAVENOUS | Status: DC | PRN
  Administered 2020-04-25: 60 mg via INTRAVENOUS

## 2020-04-25 MED ORDER — ORAL CARE MOUTH RINSE: 15.0000 mL | Freq: Once | OROMUCOSAL | Status: AC

## 2020-04-25 MED ORDER — DEXAMETHASONE SODIUM PHOSPHATE 10 MG/ML IJ SOLN
INTRAMUSCULAR | Status: DC | PRN
  Administered 2020-04-25: 5 mg via INTRAVENOUS

## 2020-04-25 MED ORDER — LACTATED RINGERS IV SOLN: INTRAVENOUS | Status: DC

## 2020-04-25 MED ORDER — FENTANYL CITRATE (PF) 250 MCG/5ML IJ SOLN
INTRAMUSCULAR | Status: AC
  Filled 2020-04-25: qty 5

## 2020-04-25 MED ORDER — OXYCODONE HCL 5 MG/5ML PO SOLN: 5.0000 mg | Freq: Once | ORAL | Status: DC | PRN

## 2020-04-25 MED ORDER — LACTATED RINGERS IV SOLN: INTRAVENOUS | Status: DC | PRN

## 2020-04-25 MED ORDER — CHLORHEXIDINE GLUCONATE 0.12 % MT SOLN
15.0000 mL | Freq: Once | OROMUCOSAL | Status: AC
  Administered 2020-04-25: 15 mL via OROMUCOSAL
  Filled 2020-04-25: qty 15

## 2020-04-25 MED ORDER — MIDAZOLAM HCL 2 MG/2ML IJ SOLN
INTRAMUSCULAR | Status: AC
  Filled 2020-04-25: qty 2

## 2020-04-25 MED ORDER — FENTANYL CITRATE (PF) 100 MCG/2ML IJ SOLN
INTRAMUSCULAR | Status: AC
  Filled 2020-04-25: qty 2

## 2020-04-25 MED ORDER — ONDANSETRON HCL 4 MG/2ML IJ SOLN
4.0000 mg | Freq: Once | INTRAMUSCULAR | Status: AC | PRN
  Administered 2020-04-25: 4 mg via INTRAVENOUS

## 2020-04-25 SURGICAL SUPPLY — 31 items
BLADE SURG 15 STRL LF DISP TIS (BLADE) ×1 IMPLANT
BLADE SURG 15 STRL SS (BLADE) ×2
BNDG COHESIVE 6X5 TAN STRL LF (GAUZE/BANDAGES/DRESSINGS) ×4 IMPLANT
COVER MAYO STAND STRL (DRAPES) ×2 IMPLANT
COVER SURGICAL LIGHT HANDLE (MISCELLANEOUS) ×2 IMPLANT
COVER WAND RF STERILE (DRAPES) IMPLANT
DRAPE BILATERAL LIMB T (DRAPES) ×2 IMPLANT
DRAPE U-SHAPE 47X51 STRL (DRAPES) ×2 IMPLANT
DRSG EMULSION OIL 3X3 NADH (GAUZE/BANDAGES/DRESSINGS) ×4 IMPLANT
DURAPREP 26ML APPLICATOR (WOUND CARE) ×4 IMPLANT
ELECT REM PT RETURN 9FT ADLT (ELECTROSURGICAL) ×2
ELECTRODE REM PT RTRN 9FT ADLT (ELECTROSURGICAL) ×1 IMPLANT
GAUZE SPONGE 4X4 12PLY STRL (GAUZE/BANDAGES/DRESSINGS) ×4 IMPLANT
GLOVE BIOGEL PI IND STRL 9 (GLOVE) ×1 IMPLANT
GLOVE BIOGEL PI INDICATOR 9 (GLOVE) ×1
GLOVE SURG ORTHO 9.0 STRL STRW (GLOVE) ×2 IMPLANT
GOWN STRL REUS W/ TWL XL LVL3 (GOWN DISPOSABLE) ×4 IMPLANT
GOWN STRL REUS W/TWL XL LVL3 (GOWN DISPOSABLE) ×8
KIT BASIN OR (CUSTOM PROCEDURE TRAY) ×2 IMPLANT
KIT TURNOVER KIT B (KITS) ×2 IMPLANT
PACK ORTHO EXTREMITY (CUSTOM PROCEDURE TRAY) ×2 IMPLANT
PAD ARMBOARD 7.5X6 YLW CONV (MISCELLANEOUS) ×4 IMPLANT
PADDING CAST ABS 4INX4YD NS (CAST SUPPLIES) ×2
PADDING CAST ABS COTTON 4X4 ST (CAST SUPPLIES) ×2 IMPLANT
SPONGE LAP 18X18 RF (DISPOSABLE) ×2 IMPLANT
STOCKINETTE 6  STRL (DRAPES) ×2
STOCKINETTE 6 STRL (DRAPES) ×1 IMPLANT
STOCKINETTE IMPERVIOUS 9X36 MD (GAUZE/BANDAGES/DRESSINGS) ×4 IMPLANT
SUT ETHILON 2 0 PSLX (SUTURE) ×2 IMPLANT
UNDERPAD 30X36 HEAVY ABSORB (UNDERPADS AND DIAPERS) IMPLANT
WATER STERILE IRR 1000ML POUR (IV SOLUTION) ×2 IMPLANT

## 2020-04-25 NOTE — Telephone Encounter (Signed)
Rx sent 

## 2020-04-25 NOTE — Anesthesia Postprocedure Evaluation (Signed)
Anesthesia Post Note  Patient: Deanna Levine  Procedure(s) Performed: BILATERAL GASTROCNEMIUS RECESSION (Bilateral Leg Lower)     Patient location during evaluation: PACU Anesthesia Type: General Level of consciousness: awake and alert Pain management: pain level controlled Vital Signs Assessment: post-procedure vital signs reviewed and stable Respiratory status: spontaneous breathing, nonlabored ventilation, respiratory function stable and patient connected to nasal cannula oxygen Cardiovascular status: blood pressure returned to baseline and stable Postop Assessment: no apparent nausea or vomiting Anesthetic complications: no   No complications documented.  Last Vitals:  Vitals:   04/25/20 0612 04/25/20 0819  BP: 106/63 118/71  Pulse: 89 98  Resp: 20 (!) 21  Temp: 36.6 C 36.6 C  SpO2: 99% 100%    Last Pain:  Vitals:   04/25/20 0819  PainSc: 10-Worst pain ever                 Zayon Trulson COKER

## 2020-04-25 NOTE — Telephone Encounter (Signed)
I called and lm on vm to advise

## 2020-04-25 NOTE — H&P (Signed)
Deanna Levine is an 20 y.o. female.   Chief Complaint: bilateral foot pain HPI: Deanna Levine is a 20 year old woman who is seen with her parents for initial evaluation for bilateral forefoot pain.  Deanna Levine states that after being on her feet for 4 to 5 hours at work standing she has pain across the ball of her foot and then later on the day has pain with start up across the ball of her foot as well.  Deanna Levine has been seen by podiatry felt that she has a leg length inequality and a heel lift was placed in the right shoe.  Deanna Levine has been seen by Dr. Junius Roads and is seen in referral from Dr. Junius Roads for evaluation for a possible subtalar bar.  Deanna Levine's past medical history is significant for developmental delay as well as a history of seizures as a child Deanna Levine did undergo physical therapy and Occupational Therapy and has type 2 diabetes.  Deanna Levine has had blood work obtained for possible autoimmune arthritic condition and these labs have been negative however Deanna Levine does have anemia with an elevated sed rate.  Past Medical History:  Diagnosis Date  . Allergy   . Complication of anesthesia    slow to wake up from anesthesia, occ HA with anesthesia   . Diabetes mellitus without complication ([place])    type 2  . Difficulty swallowing pills   . Dyspraxia   . Global developmental delay   . Obesity (BMI 30-39.9)   . PONV (postoperative nausea and vomiting)   . Precocious female puberty   . Seizures ([place])    last seizure 2012 - no current med    Past Surgical History:  Procedure Laterality Date  . [place] IMPLANT  05/06/2011   Procedure: SUPPRELIN IMPLANT;  Surgeon: Jerilynn Mages. Gerald Stabs, MD;  Location: [facility];  Service: Pediatrics;  Laterality: Right;  . SUPPRELIN IMPLANT Right 06/14/2013   Procedure: REMOVAL OF SUPPRELIN IMPLANT FROM RIGHT UPPER ARM;  Surgeon: Jerilynn Mages. Gerald Stabs, MD;  Location: [facility];  Service: Pediatrics;  Laterality: Right;  . TONSILLECTOMY  AND ADENOIDECTOMY    . TYMPANOSTOMY TUBE PLACEMENT     x 2    Family History  Problem Relation Age of Onset  . Diabetes Mother        steroid induced; hx. colitis  . Anesthesia problems Mother        severe headache lasting 2-3 days  . Rheum arthritis Mother   . Ulcerative colitis Mother   . Fibromyalgia Mother   . Hypertension Maternal Grandmother   . Hyperlipidemia Maternal Grandmother   . Diabetes Maternal Grandmother   . Kidney disease Maternal Grandfather   . Hypertension Maternal Grandfather   . Heart disease Maternal Grandfather   . Hyperlipidemia Maternal Grandfather   . Kidney disease Paternal Grandmother   . Hypertension Paternal Grandmother   . Diabetes Paternal Grandmother   . Heart disease Paternal Grandmother   . Hyperlipidemia Paternal Grandmother   . Hypertension Paternal Grandfather   . Hyperlipidemia Paternal Grandfather   . Henoch-Schonlein purpura Brother        in remission  . Asthma Maternal Aunt   . Seizures Maternal Uncle    Social History:  reports that she has never smoked. She has never used smokeless tobacco. She reports that she does not drink alcohol and does not use drugs.  Allergies: No Known Allergies  Medications Prior to Admission  Medication Sig Dispense Refill  . ACCU-CHEK GUIDE test strip USE TO TEST  BLOOD SUGAR LEVELS 4 TIMES DAILY 150 strip 5  . Accu-Chek Softclix Lancets lancets Use to test up to 6x daily 200 each 5  . blood glucose meter kit and supplies Dispense based on Deanna Levine and insurance preference. Use up to four times daily as directed. (FOR ICD-10 E10.9, E11.9). 1 each 0  . Blood Glucose Monitoring Suppl (ONETOUCH VERIO) w/Device KIT 1 kit by Does not apply route as directed. Use glucometer to check sugar up to 3x per day (E11.9) 1 kit 3  . cetirizine (ZYRTEC) 10 MG tablet Take 10 mg by mouth daily.    . cholecalciferol (VITAMIN D3) 25 MCG (1000 UNIT) tablet Take 1,000 Units by mouth daily.    . clindamycin-benzoyl  peroxide (BENZACLIN) gel Apply topically 2 (two) times daily. (Deanna Levine taking differently: Apply 1 application topically 2 (two) times daily as needed (acne).) 25 g 0  . Continuous Blood Gluc Receiver (DEXCOM G6 RECEIVER) DEVI 1 Device by Does not apply route as directed. 1 each 2  . Continuous Blood Gluc Sensor (DEXCOM G6 SENSOR) MISC Inject 1 applicator into the skin as directed. (change sensor every 10 days) 3 each 11  . Continuous Blood Gluc Transmit (DEXCOM G6 TRANSMITTER) MISC Inject 1 Device into the skin as directed. (re-use up to 8x with each new sensor) 1 each 3  . diclofenac Sodium (VOLTAREN) 1 % GEL Apply to right foot before work and after work for 60 days. (Deanna Levine not taking: Reported on 04/24/2020) 350 g 1  . Menaquinone-7 (VITAMIN K2) 100 MCG CAPS Take 100 mcg by mouth daily.    . Multiple Vitamins-Minerals (MULTIVITAMIN ADULT) CHEW Chew 1 tablet by mouth daily.    Marland Kitchen olopatadine (PATANOL) 0.1 % ophthalmic solution Place 1 drop into both eyes 2 (two) times daily as needed for allergies.    . Semaglutide,0.25 or 0.5MG/DOS, (OZEMPIC, 0.25 OR 0.5 MG/DOSE,) 2 MG/1.5ML SOPN Inject 0.25 mg into the skin once a week. 1.5 mL 11  . triamcinolone (NASACORT) 55 MCG/ACT AERO nasal inhaler Place 2 sprays into the nose daily as needed (allergies).      Results for orders placed or performed during the hospital encounter of 04/25/20 (from the past 48 hour(s))  Glucose, capillary     Status: Abnormal   Collection Time: 04/25/20  6:25 AM  Result Value Ref Range   Glucose-Capillary 116 (H) 70 - 99 mg/dL    Comment: Glucose reference range applies only to samples taken after fasting for at least 8 hours.   No results found.  Review of Systems  All other systems reviewed and are negative.   Blood pressure 106/63, pulse 89, temperature 97.9 F (36.6 C), resp. rate 20, height 5' 4"  (1.626 m), weight 97.5 kg, last menstrual period 04/18/2020, SpO2 99 %. Physical Exam  Deanna Levine is alert, oriented,  no adenopathy, well-dressed, normal affect, normal respiratory effort. On examination Deanna Levine has a plantigrade foot with ambulation external rotation worse on the right than the left with increased valgus alignment of the right knee than the left knee.  With Deanna Levine standing the right leg is a little shorter secondary to the increased valgus of the right knee.  Deanna Levine can walk on her toes and heels she reconstitutes her arch with toe walking with no evidence of posterior tibial tendon insufficiency.  She does have decreased subtalar motion most likely due to the pes planus and valgus of both feet.  I reviewed the radiographs and radiographically there is not a subtalar bar.  Deanna Levine has  severe equinus contracture bilaterally with her knee extended there is a 40 degree equinus contracture of the right and a 30 degree equinus contracture of the left her foot will go about 20 degrees past neutral with her knee flexed at 90 degrees.  Contracture primarily in the gastrocnemius muscle.  Assessment/Plan Deanna Levine has severe equinus contracture bilaterally worse in the gastroc than the soleus muscles.  We will plan for bilateral gastrocnemius recessions to provide her a plantigrade foot for weightbearing.  Newt Minion, MD 04/25/2020, 6:46 AM

## 2020-04-25 NOTE — Anesthesia Preprocedure Evaluation (Signed)
Anesthesia Evaluation  Patient identified by MRN, date of birth, ID band Patient awake    Reviewed: Allergy & Precautions, NPO status , Patient's Chart, lab work & pertinent test results  Airway Mallampati: III  TM Distance: >3 FB Neck ROM: Full    Dental  (+) Teeth Intact, Dental Advisory Given   Pulmonary    breath sounds clear to auscultation       Cardiovascular  Rhythm:Regular Rate:Normal     Neuro/Psych    GI/Hepatic   Endo/Other  diabetes  Renal/GU      Musculoskeletal   Abdominal   Peds  Hematology   Anesthesia Other Findings   Reproductive/Obstetrics                             Anesthesia Physical Anesthesia Plan  ASA: III  Anesthesia Plan: General   Post-op Pain Management:    Induction: Intravenous  PONV Risk Score and Plan: Ondansetron and Dexamethasone  Airway Management Planned: Oral ETT  Additional Equipment:   Intra-op Plan:   Post-operative Plan: Extubation in OR  Informed Consent: I have reviewed the patients History and Physical, chart, labs and discussed the procedure including the risks, benefits and alternatives for the proposed anesthesia with the patient or authorized representative who has indicated his/her understanding and acceptance.     Dental advisory given  Plan Discussed with: CRNA and Anesthesiologist  Anesthesia Plan Comments:         Anesthesia Quick Evaluation

## 2020-04-25 NOTE — Transfer of Care (Signed)
Immediate Anesthesia Transfer of Care Note  Patient: Deanna Levine  Procedure(s) Performed: BILATERAL GASTROCNEMIUS RECESSION (Bilateral Leg Lower)  Patient Location: PACU  Anesthesia Type:General  Level of Consciousness: awake, alert  and oriented  Airway & Oxygen Therapy: Patient Spontanous Breathing and Patient connected to nasal cannula oxygen  Post-op Assessment: Report given to RN, Post -op Vital signs reviewed and stable and Patient moving all extremities X 4  Post vital signs: Reviewed and stable  Last Vitals:  Vitals Value Taken Time  BP 118/71 04/25/20 0819  Temp    Pulse 98 04/25/20 0819  Resp 21 04/25/20 0819  SpO2 100 % 04/25/20 0819  Vitals shown include unvalidated device data.  Last Pain:  Vitals:   04/25/20 0639  PainSc: 0-No pain      Patients Stated Pain Goal: 3 (84/12/82 0813)  Complications: No complications documented.

## 2020-04-25 NOTE — Telephone Encounter (Signed)
I sw Emily with [facility] and pt is scheduled for Video visit on April 6 at 930am with Tamison Jewett. Pt will get a new pt packet explaining what/when/how to do the video call. If pt can not make the video appt she can call [phone] to reschedule.   If pt wants in person visit it will next year before she is seen.

## 2020-04-25 NOTE — Progress Notes (Signed)
Orthopedic Tech Progress Note Patient Details:  Shaquanta Harkless April 10, 2000 [id number] PACU RN called requesting BLE CAM WALKER BOOTS Ortho Devices Type of Ortho Device: CAM walker Ortho Device/Splint Location: BLE Ortho Device/Splint Interventions: Ordered,Application,Adjustment   Post Interventions Patient Tolerated: Well Instructions Provided: Care of device   Janit Pagan 04/25/2020, 9:06 AM

## 2020-04-25 NOTE — Telephone Encounter (Signed)
-----   Message from Pamella Pert, [place] sent at 04/23/2020  3:54 PM EST ----- Regarding: appt Can you check on this pt we made a referral in December for a genetics specialist and I do not see that she has been to see them?

## 2020-04-25 NOTE — Op Note (Signed)
04/25/2020  8:09 AM  PATIENT:  Deanna Levine    PRE-OPERATIVE DIAGNOSIS:  Bilateral Achiles Contracture  POST-OPERATIVE DIAGNOSIS:  Same  PROCEDURE:  BILATERAL GASTROCNEMIUS RECESSION  SURGEON:  Newt Minion, MD  PHYSICIAN ASSISTANT:None ANESTHESIA:   General  PREOPERATIVE INDICATIONS:  Deanna Levine is a  20 y.o. female with a diagnosis of Bilateral Achiles Contracture who failed conservative measures and elected for surgical management.    The risks benefits and alternatives were discussed with the patient preoperatively including but not limited to the risks of infection, bleeding, nerve injury, cardiopulmonary complications, the need for revision surgery, among others, and the patient was willing to proceed.  OPERATIVE IMPLANTS: none  @ENCIMAGES @  OPERATIVE FINDINGS: Required contracture with dorsiflexion 20 degrees short of neutral prior to recession.  After recession dorsiflexion 20 degrees past neutral.  OPERATIVE PROCEDURE: Patient brought the operating room underwent a general anesthetic.  After adequate levels anesthesia were obtained the left lower extremities were prepped using DuraPrep draped into a sterile field a timeout was called.  A longitudinal posterior medial incision was made starting on the left leg 15 cm proximal to the medial malleolus blunt dissection was carried down to the gastrocnemius fascia and under direct visualization the gastrocnemius fascia was released this improved dorsiflexion from 20 degrees of equinus contracture to 20 degrees past neutral.  The wound was irrigated normal saline incision was closed using 2-0 nylon attention was then focused on the right gastrocnemius fascia.  A posterior medial longitudinal incision was made 15 cm proximal to the medial malleolus blunt dissection was carried down to the gastrocnemius fascia and the fascia was released under direct visualization dorsiflexion improved from 20 degrees of equinus contracture to 20  degrees past neutral the incision was irrigated with normal saline and closed with 2-0 nylon sterile dressings were applied bilaterally patient was extubated taken to PACU in stable condition.   DISCHARGE PLANNING:  Antibiotic duration: Preoperatively  Weightbearing: Weightbearing as tolerated  Pain medication: Hydrocodone  Dressing care/ Wound VAC: Follow-up in the office 1 week to change the dressing  Ambulatory devices: Has a walker  Discharge to: Home with parents  Follow-up: In the office 1 week post operative.

## 2020-04-25 NOTE — Anesthesia Procedure Notes (Signed)
Procedure Name: LMA Insertion Date/Time: 04/25/2020 7:42 AM Performed by: Gaylene Brooks, CRNA Pre-anesthesia Checklist: Patient identified, Emergency Drugs available, Suction available and Patient being monitored Patient Re-evaluated:Patient Re-evaluated prior to induction Oxygen Delivery Method: Circle System Utilized Preoxygenation: Pre-oxygenation with 100% oxygen Induction Type: IV induction Ventilation: Mask ventilation without difficulty LMA: LMA inserted LMA Size: 4.0 Number of attempts: 1 Airway Equipment and Method: Bite block Placement Confirmation: positive ETCO2 Tube secured with: Tape Dental Injury: Teeth and Oropharynx as per pre-operative assessment

## 2020-04-25 NOTE — Telephone Encounter (Signed)
[facility] on [address] is out of hydrocodone but she said the [facility] on [address] has it so can you change it

## 2020-04-25 NOTE — Telephone Encounter (Signed)
Pt was bilat gastroc this morning please see below.

## 2020-04-26 ENCOUNTER — Encounter (HOSPITAL_COMMUNITY): Payer: Self-pay | Admitting: Orthopedic Surgery

## 2020-04-27 LAB — NASOPHARYNGEAL CULTURE: Culture: NORMAL

## 2020-04-28 ENCOUNTER — Telehealth: Payer: Self-pay

## 2020-04-28 NOTE — Telephone Encounter (Signed)
Called and left a VM for patient to CB to schedule a postop appointment with Dr. Sharol Given.

## 2020-05-01 ENCOUNTER — Inpatient Hospital Stay: Payer: 59 | Admitting: Orthopedic Surgery

## 2020-05-01 ENCOUNTER — Encounter: Payer: Self-pay | Admitting: Orthopedic Surgery

## 2020-05-01 ENCOUNTER — Ambulatory Visit (INDEPENDENT_AMBULATORY_CARE_PROVIDER_SITE_OTHER): Payer: 59 | Admitting: Physician Assistant

## 2020-05-01 DIAGNOSIS — M6702 Short Achilles tendon (acquired), left ankle: Secondary | ICD-10-CM

## 2020-05-01 DIAGNOSIS — M79671 Pain in right foot: Secondary | ICD-10-CM

## 2020-05-01 DIAGNOSIS — M6701 Short Achilles tendon (acquired), right ankle: Secondary | ICD-10-CM

## 2020-05-01 DIAGNOSIS — M79672 Pain in left foot: Secondary | ICD-10-CM

## 2020-05-01 NOTE — Progress Notes (Signed)
Office Visit Note   Patient: Deanna Levine           Date of Birth: 01-Oct-2000           MRN: [id number] Visit Date: 05/01/2020              Requested by: [name], MD [address] [place],  [place] [address] PCP: [name], MD  Chief Complaint  Patient presents with  . Left Foot - Follow-up  . Right Foot - Follow-up      HPI: This is a pleasant 20 year old woman who is not quite 2 weeks status post bilateral gastrocnemius recessions. [name] ambulating in her cam boots with a rolling walker. She does not have any complaints. [name] accompanied by her parents  Assessment & Plan: Visit Diagnoses: No diagnosis found.  Plan: Continue with boot until sutures are removed. I would anticipate this would be next week. She can now cleanse with Dial soap and water. She does not have to wear the boots when she is not ambulating  Follow-Up Instructions: No follow-ups on file.   Ortho Exam  Patient is alert, oriented, no adenopathy, well-dressed, normal affect, normal respiratory effort. Examination neither wound has any erythema swelling is well controlled. She has some bloody drainage from the right wound. Compartments are soft and compressible minimal tenderness. No signs of infection no ascending cellulitis  Imaging: No results found. No images are attached to the encounter.  Labs: Lab Results  Component Value Date   HGBA1C 6.2 04/24/2020   HGBA1C 6.6 (A) 12/11/2019   HGBA1C 6.3 (A) 09/20/2019   ESRSEDRATE CANCELED 02/07/2020   ESRSEDRATE 29 (H) 01/18/2020   ESRSEDRATE 33 (H) 10/09/2019   CRP 20.8 (H) 01/18/2020   CRP 26.7 (H) 10/09/2019   LABURIC 6.4 10/09/2019   REPTSTATUS 04/27/2020 FINAL 04/25/2020   CULT  04/25/2020    FEW Normal respiratory flora-no Staph aureus or Pseudomonas seen Performed at [facility], [place] [address] [place], [place] [address]      Lab Results  Component Value Date   ALBUMIN 4.4 03/05/2019   ALBUMIN  4.4 11/15/2018   ALBUMIN 4.6 05/06/2016   LABURIC 6.4 10/09/2019    No results found for: MG Lab Results  Component Value Date   VD25OH 26 (L) 10/09/2019   VD25OH 30 05/06/2016    No results found for: PREALBUMIN CBC EXTENDED Latest Ref Rng & Units 04/25/2020 02/07/2020 03/05/2019  WBC 4.0 - 10.5 K/uL 8.5 8.4 8.1  RBC 3.87 - 5.11 MIL/uL 5.10 5.15(H) 5.32(H)  HGB 12.0 - 15.0 g/dL 11.0(L) 10.6(L) 11.3  HCT 36.0 - 46.0 % 35.4(L) 35.1 35.7  PLT 150 - 400 K/uL 334 379 182  NEUTROABS 1,500 - 7,800 cells/uL - 5,527 -  LYMPHSABS 850 - 3,900 cells/uL - 2,461 -     There is no height or weight on file to calculate BMI.  Orders:  No orders of the defined types were placed in this encounter.  No orders of the defined types were placed in this encounter.    Procedures: No procedures performed  Clinical Data: No additional findings.  ROS:  All other systems negative, except as noted in the HPI. Review of Systems  Objective: Vital Signs: LMP 04/18/2020 (Exact Date)   Specialty Comments:  No specialty comments available.  PMFS History: Patient Active Problem List   Diagnosis Date Noted  . Achilles tendon contracture, bilateral   . Elevated sedimentation rate 02/07/2020  . Pain  in right ankle and joints of right foot 02/07/2020  . Anemia 12/29/2018  . Type 2 diabetes mellitus ([place]) 08/15/2017  . Elevated triglycerides with high cholesterol 12/09/2016  . Hydradenitis 09/07/2016  . Partial epilepsy with impairment of consciousness ([place]) 08/16/2012  . Mild intellectual disability 08/16/2012  . Laxity of ligament 08/16/2012  . Delayed milestones 08/16/2012  . Obesity 06/07/2011  . Dyspepsia 02/11/2011  . Acanthosis nigricans 02/11/2011  . Global developmental delay   . Petit mal without grand mal seizures ([place])   . Dyspraxia    Past Medical History:  Diagnosis Date  . Allergy   . Complication of anesthesia    slow to wake up from anesthesia, occ HA with anesthesia    . Diabetes mellitus without complication ([place])    type 2  . Difficulty swallowing pills   . Dyspraxia   . Global developmental delay   . Obesity (BMI 30-39.9)   . PONV (postoperative nausea and vomiting)   . Precocious female puberty   . Seizures ([place])    last seizure 2012 - no current med    Family History  Problem Relation Age of Onset  . Diabetes Mother        steroid induced; hx. colitis  . Anesthesia problems Mother        severe headache lasting 2-3 days  . Rheum arthritis Mother   . Ulcerative colitis Mother   . Fibromyalgia Mother   . Hypertension Maternal Grandmother   . Hyperlipidemia Maternal Grandmother   . Diabetes Maternal Grandmother   . Kidney disease Maternal Grandfather   . Hypertension Maternal Grandfather   . Heart disease Maternal Grandfather   . Hyperlipidemia Maternal Grandfather   . Kidney disease Paternal Grandmother   . Hypertension Paternal Grandmother   . Diabetes Paternal Grandmother   . Heart disease Paternal Grandmother   . Hyperlipidemia Paternal Grandmother   . Hypertension Paternal Grandfather   . Hyperlipidemia Paternal Grandfather   . Henoch-Schonlein purpura Brother        in remission  . Asthma Maternal Aunt   . Seizures Maternal Uncle     Past Surgical History:  Procedure Laterality Date  . GASTROCNEMIUS RECESSION Bilateral 04/25/2020   Procedure: BILATERAL GASTROCNEMIUS RECESSION;  Surgeon: Newt Minion, MD;  Location: [facility];  Service: Orthopedics;  Laterality: Bilateral;  . [place] IMPLANT  05/06/2011   Procedure: SUPPRELIN IMPLANT;  Surgeon: Jerilynn Mages. Gerald Stabs, MD;  Location: [facility];  Service: Pediatrics;  Laterality: Right;  . SUPPRE[place] IMPLANT Right 06/14/2013   Procedure: REMOVAL OF SUPPRELIN IMPLANT FROM RIGHT UPPER ARM;  Surgeon: Jerilynn Mages. Gerald Stabs, MD;  Location: [facility];  Service: Pediatrics;  Laterality: Right;  . TONSILLECTOMY AND ADENOIDECTOMY    . TYMPANOSTOMY TUBE PLACEMENT      x 2   Social History   Occupational History  . 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 Smoking status: Never Smoker  . Smokeless tobacco: Never Used  Vaping Use  . Vaping Use: Never used  Substance and Sexual Activity  . Alcohol use: No    Alcohol/week: 0.0 standard drinks  . Drug use: No  . Sexual activity: Never    Birth control/protection: None

## 2020-05-07 ENCOUNTER — Ambulatory Visit (INDEPENDENT_AMBULATORY_CARE_PROVIDER_SITE_OTHER): Payer: 59 | Admitting: Podiatry

## 2020-05-07 ENCOUNTER — Encounter: Payer: Self-pay | Admitting: Podiatry

## 2020-05-07 ENCOUNTER — Other Ambulatory Visit: Payer: Self-pay

## 2020-05-07 DIAGNOSIS — B351 Tinea unguium: Secondary | ICD-10-CM | POA: Diagnosis not present

## 2020-05-07 DIAGNOSIS — M2141 Flat foot [pes planus] (acquired), right foot: Secondary | ICD-10-CM

## 2020-05-07 DIAGNOSIS — M79609 Pain in unspecified limb: Secondary | ICD-10-CM | POA: Diagnosis not present

## 2020-05-07 DIAGNOSIS — M2142 Flat foot [pes planus] (acquired), left foot: Secondary | ICD-10-CM

## 2020-05-07 DIAGNOSIS — E119 Type 2 diabetes mellitus without complications: Secondary | ICD-10-CM | POA: Diagnosis not present

## 2020-05-07 NOTE — Progress Notes (Signed)
  Subjective:  Patient ID: Deanna Levine, female    DOB: Aug 22, 2000,  MRN: [id number]  20 y.o. female presents with preventative diabetic foot care and painful thick toenails that are difficult to trim. Pain interferes with ambulation. Aggravating factors include wearing enclosed shoe gear. Pain is relieved with periodic professional debridement.   Deanna Levine has h/o seizures, developmental delay and mild intellectual disability. Therefore, her parents are very involved in making her health care decisions.  Mom is present during today's visit. . Mom states Deanna Levine has had bilateral tendon releases of both lower extremities since her last visit with [name].  Review of Systems: Negative except as noted in the HPI.   No Known Allergies   Objective:  There were no vitals filed for this visit. Constitutional Patient is a pleasant 20 y.o. African American female obese in NAD. AAO x 3.  Vascular Neurovascular status unchanged b/l lower extremities. Capillary refill time to digits immediate b/l. Palpable pedal pulses b/l LE. Pedal hair present. Lower extremity skin temperature gradient within normal limits. No pain with calf compression b/l. No cyanosis or clubbing noted.  Neurologic Normal speech. Oriented to person, place, and time. Protective sensation intact 5/5 intact bilaterally with 10g monofilament b/l.  Dermatologic Pedal skin with normal turgor, texture and tone bilaterally. No open wounds bilaterally. No interdigital macerations bilaterally. Toenails 1-5 bilaterally elongated, discolored, dystrophic, thickened, and crumbly with subungual debris and tenderness to dorsal palpation.  Orthopedic: Wearing b/l Cam Walkers and utilizing walker on today's visit.   Hemoglobin A1C Latest Ref Rng & Units 04/24/2020 12/11/2019 09/20/2019 05/17/2019  HGBA1C 0.0 - 7.0 % 6.2 6.6(A) 6.3(A) >14  Some recent data might be hidden   Assessment:   1. Pain due to onychomycosis of nail   2. Pes planus of both feet    3. Type 2 diabetes mellitus without complication, without long-term current use of insulin ([place])    Plan:  Patient was evaluated and treated and all questions answered.  Onychomycosis with pain -Nails palliatively debridement as below. -Educated on self-care  Procedure: Nail Debridement Rationale: Pain Type of Debridement: manual, sharp debridement. Instrumentation: Nail nipper, rotary burr. Number of Nails: 10  -Examined patient. -Continue diabetic foot care principles. -Toenails 1-5 b/l  were debrided in length and girth with sterile nail nippers and dremel without iatrogenic bleeding.  -Patient to report any pedal injuries to medical professional immediately. [place] is s/p bilateral gastrocnemius recession and is followed by Dr. Sharol Given. -Patient to continue soft, supportive shoe gear daily. -Patient/POA to call should there be question/concern in the interim.  Return in about 3 months (around 08/07/2020) for diabetic nail trim.  Marzetta Board, DPM

## 2020-05-08 ENCOUNTER — Encounter: Payer: Self-pay | Admitting: Orthopedic Surgery

## 2020-05-08 ENCOUNTER — Ambulatory Visit (INDEPENDENT_AMBULATORY_CARE_PROVIDER_SITE_OTHER): Payer: 59 | Admitting: Physician Assistant

## 2020-05-08 DIAGNOSIS — M6702 Short Achilles tendon (acquired), left ankle: Secondary | ICD-10-CM

## 2020-05-08 DIAGNOSIS — M6701 Short Achilles tendon (acquired), right ankle: Secondary | ICD-10-CM

## 2020-05-08 NOTE — Progress Notes (Signed)
Office Visit Note   Patient: Deanna Levine           Date of Birth: 2000/08/21           MRN: [id number] Visit Date: 05/08/2020              Requested by: [name], MD [address] [place],  [place] [address] PCP: [name], MD  Chief Complaint  Patient presents with  . Right Leg - Routine Post Op    04/25/20 bilateral gastroc recession   . Left Leg - Routine Post Op      HPI: Patient is a pleasant 20 year old woman who is almost 3 weeks status post bilateral gastrocnemius recessions.  [name] ambulating cam walker boots.  Overall doing well.  Her parents are concerned about her weaning out of the boot as when she is taken them off she seems very off balance  Assessment & Plan: Visit Diagnoses:  1. Achilles tendon contracture, bilateral     Plan: Patient will be referred to physical therapy she has worked with [facility] physical therapy in the past and I will use this as a referral.  Sutures will be removed today.  She may weight-bear as tolerated and wean out of the boot as she feels safe with therapy.  Follow-up in 2 weeks.  Follow-Up Instructions: No follow-ups on file.   Ortho Exam  Patient is alert, oriented, no adenopathy, well-dressed, normal affect, normal respiratory effort. Focused examination demonstrates well-healed surgical incisions no cellulitis no redness swelling is well controlled compartments are soft and nontender surgical sutures are in place she has good dorsiflexion in her legs without much pain  Imaging: No results found. No images are attached to the encounter.  Labs: Lab Results  Component Value Date   HGBA1C 6.2 04/24/2020   HGBA1C 6.6 (A) 12/11/2019   HGBA1C 6.3 (A) 09/20/2019   ESRSEDRATE CANCELED 02/07/2020   ESRSEDRATE 29 (H) 01/18/2020   ESRSEDRATE 33 (H) 10/09/2019   CRP 20.8 (H) 01/18/2020   CRP 26.7 (H) 10/09/2019   LABURIC 6.4 10/09/2019   REPTSTATUS 04/27/2020 FINAL 04/25/2020   CULT  04/25/2020     FEW Normal respiratory flora-no Staph aureus or Pseudomonas seen Performed at [facility], [place] [address], [place], [place] [address]      Lab Results  Component Value Date   ALBUMIN 4.4 03/05/2019   ALBUMIN 4.4 11/15/2018   ALBUMIN 4.6 05/06/2016   LABURIC 6.4 10/09/2019    No results found for: MG Lab Results  Component Value Date   VD25OH 26 (L) 10/09/2019   VD25OH 30 05/06/2016    No results found for: PREALBUMIN CBC EXTENDED Latest Ref Rng & Units 04/25/2020 02/07/2020 03/05/2019  WBC 4.0 - 10.5 K/uL 8.5 8.4 8.1  RBC 3.87 - 5.11 MIL/uL 5.10 5.15(H) 5.32(H)  HGB 12.0 - 15.0 g/dL 11.0(L) 10.6(L) 11.3  HCT 36.0 - 46.0 % 35.4(L) 35.1 35.7  PLT 150 - 400 K/uL 334 379 182  NEUTROABS 1,500 - 7,800 cells/uL - 5,527 -  LYMPHSABS 850 - 3,900 cells/uL - 2,461 -     There is no height or weight on file to calculate BMI.  Orders:  Orders Placed This Encounter  Procedures  . Ambulatory referral to Physical Therapy   No orders of the defined types were placed in this encounter.    Procedures: No procedures performed  Clinical Data: No additional findings.  ROS:  All other systems negative, except as noted in  the HPI. Review of Systems  Objective: Vital Signs: LMP 04/18/2020 (Exact Date)   Specialty Comments:  No specialty comments available.  PMFS History: Patient Active Problem List   Diagnosis Date Noted  . Achilles tendon contracture, bilateral   . Elevated sedimentation rate 02/07/2020  . Pain in right ankle and joints of right foot 02/07/2020  . Anemia 12/29/2018  . Type 2 diabetes mellitus ([place]) 08/15/2017  . Elevated triglycerides with high cholesterol 12/09/2016  . Hydradenitis 09/07/2016  . Partial epilepsy with impairment of consciousness ([place]) 08/16/2012  . Mild intellectual disability 08/16/2012  . Laxity of ligament 08/16/2012  . Delayed milestones 08/16/2012  . Obesity 06/07/2011  . Dyspepsia 02/11/2011  . Acanthosis nigricans  02/11/2011  . Global developmental delay   . Petit mal without grand mal seizures ([place])   . Dyspraxia    Past Medical History:  Diagnosis Date  . Allergy   . Complication of anesthesia    slow to wake up from anesthesia, occ HA with anesthesia   . Diabetes mellitus without complication ([place])    type 2  . Difficulty swallowing pills   . Dyspraxia   . Global developmental delay   . Obesity (BMI 30-39.9)   . PONV (postoperative nausea and vomiting)   . Precocious female puberty   . Seizures ([place])    last seizure 2012 - no current med    Family History  Problem Relation Age of Onset  . Diabetes Mother        steroid induced; hx. colitis  . Anesthesia problems Mother        severe headache lasting 2-3 days  . Rheum arthritis Mother   . Ulcerative colitis Mother   . Fibromyalgia Mother   . Hypertension Maternal Grandmother   . Hyperlipidemia Maternal Grandmother   . Diabetes Maternal Grandmother   . Kidney disease Maternal Grandfather   . Hypertension Maternal Grandfather   . Heart disease Maternal Grandfather   . Hyperlipidemia Maternal Grandfather   . Kidney disease Paternal Grandmother   . Hypertension Paternal Grandmother   . Diabetes Paternal Grandmother   . Heart disease Paternal Grandmother   . Hyperlipidemia Paternal Grandmother   . Hypertension Paternal Grandfather   . Hyperlipidemia Paternal Grandfather   . Henoch-Schonlein purpura Brother        in remission  . Asthma Maternal Aunt   . Seizures Maternal Uncle     Past Surgical History:  Procedure Laterality Date  . GASTROCNEMIUS RECESSION Bilateral 04/25/2020   Procedure: BILATERAL GASTROCNEMIUS RECESSION;  Surgeon: Newt Minion, MD;  Location: [facility];  Service: Orthopedics;  Laterality: Bilateral;  . [place] IMPLANT  05/06/2011   Procedure: SUPPRELIN IMPLANT;  Surgeon: Jerilynn Mages. Gerald Stabs, MD;  Location: [facility];  Service: Pediatrics;  Laterality: Right;  . SUPPRELIN IMPLANT Right  06/14/2013   Procedure: REMOVAL OF SUPPRELIN IMPLANT FROM RIGHT UPPER ARM;  Surgeon: Jerilynn Mages. Gerald Stabs, MD;  Location: [facility];  Service: Pediatrics;  Laterality: Right;  . TONSILLECTOMY AND ADENOIDECTOMY    . TYMPANOSTOMY TUBE PLACEMENT     x 2   Social History   Occupational History  . 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 Smoking status: Never Smoker  . Smokeless tobacco: Never Used  Vaping Use  . Vaping Use: Never used  Substance and Sexual Activity  . Alcohol use: No    Alcohol/week: 0.0 standard drinks  . Drug use: No  . Sexual activity: Never    Birth  control/protection: None

## 2020-05-22 ENCOUNTER — Encounter: Payer: Self-pay | Admitting: Orthopedic Surgery

## 2020-05-22 ENCOUNTER — Ambulatory Visit (INDEPENDENT_AMBULATORY_CARE_PROVIDER_SITE_OTHER): Payer: 59 | Admitting: Physician Assistant

## 2020-05-22 ENCOUNTER — Encounter: Payer: Self-pay | Admitting: Internal Medicine

## 2020-05-22 DIAGNOSIS — M6701 Short Achilles tendon (acquired), right ankle: Secondary | ICD-10-CM

## 2020-05-22 DIAGNOSIS — M6702 Short Achilles tendon (acquired), left ankle: Secondary | ICD-10-CM

## 2020-05-22 NOTE — Progress Notes (Signed)
Office Visit Note   Patient: Deanna Levine           Date of Birth: [birth year]           MRN: [id number] Visit Date: 05/22/2020              Requested by: [name], MD [address] [place],  [place] [address] PCP: [name], MD  Chief Complaint  Patient presents with  . Right Leg - Routine Post Op    04/25/20 bilateral gastroc recession   . Left Leg - Routine Post Op      HPI: Patient is a 20 year old woman who is 1 month status post bilateral gastrocnemius recessions.  She has been in her tall cam walker boot and would like to transition to possible to supportive shoes.  She does have physical therapy scheduled  Assessment & Plan: Visit Diagnoses: No diagnosis found.  Plan: Patient will follow-up for final visit in 1 month she may transition to a tennis sneakers.  She knows that her legs may be weak so she should be very careful and use her assistive device as needed.  Follow-Up Instructions: No follow-ups on file.   Ortho Exam  Patient is alert, oriented, no adenopathy, well-dressed, normal affect, normal respiratory effort. Bilateral ankles well-healed surgical incisions.  Swelling is well controlled compartments are soft and nontender.  She has excellent dorsiflexion plantarflexion and can dorsiflex past neutral.  Achilles is intact  Imaging: No results found. No images are attached to the encounter.  Labs: Lab Results  Component Value Date   HGBA1C 6.2 04/24/2020   HGBA1C 6.6 (A) 12/11/2019   HGBA1C 6.3 (A) 09/20/2019   ESRSEDRATE CANCELED 02/07/2020   ESRSEDRATE 29 (H) 01/18/2020   ESRSEDRATE 33 (H) 10/09/2019   CRP 20.8 (H) 01/18/2020   CRP 26.7 (H) 10/09/2019   LABURIC 6.4 10/09/2019   REPTSTATUS 04/27/2020 FINAL 04/25/2020   CULT  04/25/2020    FEW Normal respiratory flora-no Staph aureus or Pseudomonas seen Performed at [name], [place] [address], [place], [place] [address]      Lab Results  Component Value  Date   ALBUMIN 4.4 03/05/2019   ALBUMIN 4.4 11/15/2018   ALBUMIN 4.6 05/06/2016    No results found for: MG Lab Results  Component Value Date   VD25OH 26 (L) 10/09/2019   VD25OH 30 05/06/2016    No results found for: PREALBUMIN CBC EXTENDED Latest Ref Rng & Units 04/25/2020 02/07/2020 03/05/2019  WBC 4.0 - 10.5 K/uL 8.5 8.4 8.1  RBC 3.87 - 5.11 MIL/uL 5.10 5.15(H) 5.32(H)  HGB 12.0 - 15.0 g/dL 11.0(L) 10.6(L) 11.3  HCT 36.0 - 46.0 % 35.4(L) 35.1 35.7  PLT 150 - 400 K/uL 334 379 182  NEUTROABS 1,500 - 7,800 cells/uL - 5,527 -  LYMPHSABS 850 - 3,900 cells/uL - 2,461 -     There is no height or weight on file to calculate BMI.  Orders:  No orders of the defined types were placed in this encounter.  No orders of the defined types were placed in this encounter.    Procedures: No procedures performed  Clinical Data: No additional findings.  ROS:  All other systems negative, except as noted in the HPI. Review of Systems  Objective: Vital Signs: There were no vitals taken for this visit.  Specialty Comments:  No specialty comments available.  PMFS History: Patient Active Problem List   Diagnosis Date Noted  . Achilles tendon contracture, bilateral   .  Elevated sedimentation rate 02/07/2020  . Pain in right ankle and joints of right foot 02/07/2020  . Anemia 12/29/2018  . Type 2 diabetes mellitus ([place]) 08/15/2017  . Elevated triglycerides with high cholesterol 12/09/2016  . Hydradenitis 09/07/2016  . Partial epilepsy with impairment of consciousness ([place]) 08/16/2012  . Mild intellectual disability 08/16/2012  . Laxity of ligament 08/16/2012  . Delayed milestones 08/16/2012  . Obesity 06/07/2011  . Dyspepsia 02/11/2011  . Acanthosis nigricans 02/11/2011  . Global developmental delay   . Petit mal without grand mal seizures ([place])   . Dyspraxia    Past Medical History:  Diagnosis Date  . Allergy   . Complication of anesthesia    slow to wake up from  anesthesia, occ HA with anesthesia   . Diabetes mellitus without complication ([place])    type 2  . Difficulty swallowing pills   . Dyspraxia   . Global developmental delay   . Obesity (BMI 30-39.9)   . PONV (postoperative nausea and vomiting)   . Precocious female puberty   . Seizures ([place])    last seizure 2012 - no current med    Family History  Problem Relation Age of Onset  . Diabetes Mother        steroid induced; hx. colitis  . Anesthesia problems Mother        severe headache lasting 2-3 days  . Rheum arthritis Mother   . Ulcerative colitis Mother   . Fibromyalgia Mother   . Hypertension Maternal Grandmother   . Hyperlipidemia Maternal Grandmother   . Diabetes Maternal Grandmother   . Kidney disease Maternal Grandfather   . Hypertension Maternal Grandfather   . Heart disease Maternal Grandfather   . Hyperlipidemia Maternal Grandfather   . Kidney disease Paternal Grandmother   . Hypertension Paternal Grandmother   . Diabetes Paternal Grandmother   . Heart disease Paternal Grandmother   . Hyperlipidemia Paternal Grandmother   . Hypertension Paternal Grandfather   . Hyperlipidemia Paternal Grandfather   . Henoch-Schonlein purpura Brother        in remission  . Asthma Maternal Aunt   . Seizures Maternal Uncle     Past Surgical History:  Procedure Laterality Date  . GASTROCNEMIUS RECESSION Bilateral 04/25/2020   Procedure: BILATERAL GASTROCNEMIUS RECESSION;  Surgeon: Newt Minion, MD;  Location: [facility];  Service: Orthopedics;  Laterality: Bilateral;  . [place] IMPLANT  05/06/2011   Procedure: SUPPRELIN IMPLANT;  Surgeon: Jerilynn Mages. Gerald Stabs, MD;  Location: [facility];  Service: Pediatrics;  Laterality: Right;  . SUPPRELIN IMPLANT Right 06/14/2013   Procedure: REMOVAL OF SUPPRELIN IMPLANT FROM RIGHT UPPER ARM;  Surgeon: Jerilynn Mages. Gerald Stabs, MD;  Location: [facility];  Service: Pediatrics;  Laterality: Right;  . TONSILLECTOMY AND ADENOIDECTOMY     . TYMPANOSTOMY TUBE PLACEMENT     x 2   Social History   Occupational History  . 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 Smoking status: Never Smoker  . Smokeless tobacco: Never Used  Vaping Use  . Vaping Use: Never used  Substance and Sexual Activity  . Alcohol use: No    Alcohol/week: 0.0 standard drinks  . Drug use: No  . Sexual activity: Never    Birth control/protection: None

## 2020-05-26 ENCOUNTER — Encounter: Payer: Self-pay | Admitting: Internal Medicine

## 2020-05-26 ENCOUNTER — Other Ambulatory Visit: Payer: Self-pay

## 2020-05-26 ENCOUNTER — Ambulatory Visit (INDEPENDENT_AMBULATORY_CARE_PROVIDER_SITE_OTHER): Payer: 59 | Admitting: Internal Medicine

## 2020-05-26 VITALS — BP 124/78 | HR 81 | Temp 99.0°F | Resp 18 | Ht 64.0 in | Wt 214.8 lb

## 2020-05-26 DIAGNOSIS — L732 Hidradenitis suppurativa: Secondary | ICD-10-CM

## 2020-05-26 MED ORDER — SULFAMETHOXAZOLE-TRIMETHOPRIM 800-160 MG PO TABS: 1.0000 | ORAL_TABLET | Freq: Two times a day (BID) | ORAL | 0 refills | Status: AC

## 2020-05-26 MED ORDER — CHLORHEXIDINE GLUCONATE 4 % EX LIQD: Freq: Every day | CUTANEOUS | 0 refills | Status: DC | PRN

## 2020-05-26 NOTE — Patient Instructions (Addendum)
We will get you in with the wound center to work on healing these spots.  We have sent in an antibiotic bactrim to take 1 pill twice a day for 1 week.   We have also sent in the liquid soap to use 2-3 times a week on the areas to help them from getting worse again in the future.

## 2020-05-26 NOTE — Assessment & Plan Note (Signed)
Rx bactrim, chlorhexidine and referral to wound clinic for the sores under the breasts.

## 2020-05-26 NOTE — Progress Notes (Signed)
   Subjective:   Patient ID: Deanna Levine, female    DOB: [birth year], 20 y.o.   MRN: [id number]  HPI The patient is a 20 YO female coming in for worsening abscess in the armpit region bilaterally and under the breasts. They started getting worse in the last few months. Denies fevers or chills. They do hurt at times. Does have intermittent drainage.   Review of Systems  Constitutional: Negative.   HENT: Negative.   Eyes: Negative.   Respiratory: Negative for cough, chest tightness and shortness of breath.   Cardiovascular: Negative for chest pain, palpitations and leg swelling.  Gastrointestinal: Negative for abdominal distention, abdominal pain, constipation, diarrhea, nausea and vomiting.  Musculoskeletal: Negative.   Skin: Positive for wound.  Neurological: Negative.   Psychiatric/Behavioral: Negative.     Objective:  Physical Exam Constitutional:      Appearance: [name] well-developed. [name] obese.  HENT:     Head: Normocephalic and atraumatic.  Cardiovascular:     Rate and Rhythm: Normal rate and regular rhythm.  Pulmonary:     Effort: Pulmonary effort is normal. No respiratory distress.     Breath sounds: Normal breath sounds. No wheezing or rales.  Abdominal:     General: Bowel sounds are normal. There is no distension.     Palpations: Abdomen is soft.     Tenderness: There is no abdominal tenderness. There is no rebound.  Musculoskeletal:     Cervical back: Normal range of motion.  Skin:    General: Skin is warm and dry.     Comments: Thickened skin under the armpits and several sores under the breasts  Neurological:     Mental Status: [name] alert and oriented to person, place, and time.     Coordination: Coordination normal.     Vitals:   05/26/20 0827  BP: 124/78  Pulse: 81  Resp: 18  Temp: 99 F (37.2 C)  TempSrc: Oral  SpO2: 98%  Weight: 214 lb 12.8 oz (97.4 kg)  Height: 5' 4"  (1.626 m)    This visit occurred during the SARS-CoV-2 public  health emergency.  Safety protocols were in place, including screening questions prior to the visit, additional usage of staff PPE, and extensive cleaning of exam room while observing appropriate contact time as indicated for disinfecting solutions.   Assessment & Plan:

## 2020-06-04 ENCOUNTER — Encounter (HOSPITAL_BASED_OUTPATIENT_CLINIC_OR_DEPARTMENT_OTHER): Payer: 59 | Attending: Internal Medicine | Admitting: Physician Assistant

## 2020-06-04 ENCOUNTER — Other Ambulatory Visit: Payer: Self-pay

## 2020-06-04 DIAGNOSIS — L98492 Non-pressure chronic ulcer of skin of other sites with fat layer exposed: Secondary | ICD-10-CM | POA: Diagnosis not present

## 2020-06-04 DIAGNOSIS — E11622 Type 2 diabetes mellitus with other skin ulcer: Secondary | ICD-10-CM | POA: Diagnosis not present

## 2020-06-04 DIAGNOSIS — L732 Hidradenitis suppurativa: Secondary | ICD-10-CM | POA: Diagnosis present

## 2020-06-04 NOTE — Progress Notes (Signed)
Deanna Levine, Deanna Levine ([id number]) Visit Report for 06/04/2020 Chief Complaint Document Details Patient Name: Date of Service: Deanna Levine, Deanna Levine [facility] 06/04/2020 1:15 PM Medical Record Number: [id number] Patient Account Number: [id number] Date of Birth/Sex: Treating RN: [birth year] (20 y.o. Female) Baruch Gouty Primary Care Provider: Pricilla Holm Other Clinician: Referring Provider: Treating Provider/Extender: Dreama Saa in Treatment: 0 Information Obtained from: Patient Chief Complaint Hidradenitis Right Breast Electronic Signature(s) Signed: 06/04/2020 3:00:35 PM By: Worthy Keeler PA-C Previous Signature: 06/04/2020 2:46:06 PM Version By: Worthy Keeler PA-C Entered By: Worthy Keeler on 06/04/2020 15:00:35 -------------------------------------------------------------------------------- HPI Details Patient Name: Date of Service: Deanna Levine, Deanna Levine 06/04/2020 1:15 PM Medical Record Number: [id number] Patient Account Number: [id number] Date of Birth/Sex: Treating RN: 2000-06-10 (20 y.o. Female) Baruch Gouty Primary Care Provider: Pricilla Holm Other Clinician: Referring Provider: Treating Provider/Extender: Dreama Saa in Treatment: 0 History of Present Illness HPI Description: 06/04/2020 patient presents today for initial evaluation here in clinic concerning issues she has been having with her under her arms, under the breast, in the suprapubic region, and on her upper back as well with issues which are consistent with hidradenitis though I think it is more on the milder side. Her and her mother have been on top of this trying to keep things under control. Her mother also has issues with hidradenitis though not severely either. This does seem to potentially be something that runs in the family. Nonetheless fortunately there does not appear to be any evidence of significant infection at this point. The wound which is actually  under the right breast location actually does not appear to be infected right now which is great news. With that being said I do see evidence currently of scarring in multiple locations which show that the patient has obviously had issues in the past that several other locations as well. All in the same regions. Nonetheless she does not have a tremendous amount of pain noted at this point which is good news. She does have some mild mental disability according to her mother she does have mild dysphagia as well which requires that she has to either take liquid or crushed antibiotics when she does get those. Her most recent hemoglobin A1c was 6.6. Currently this episode has been going on for about 6 months ago again 3 years as the total length of time she has been dealing with this. She has never seen a hidradenitis clinic. Electronic Signature(s) Signed: 06/04/2020 3:02:10 PM By: Worthy Keeler PA-C Entered By: Worthy Keeler on 06/04/2020 15:02:10 -------------------------------------------------------------------------------- Physical Exam Details Patient Name: Date of Service: Deanna Levine, Deanna Levine 06/04/2020 1:15 PM Medical Record Number: [id number] Patient Account Number: [id number] Date of Birth/Sex: Treating RN: 2001-02-11 (21 y.o. Female) Baruch Gouty Primary Care Provider: Pricilla Holm Other Clinician: Referring Provider: Treating Provider/Extender: Dreama Saa in Treatment: 0 Constitutional sitting or standing blood pressure is within target range for patient.. pulse regular and within target range for patient.Marland Kitchen respirations regular, non-labored and within target range for patient.Marland Kitchen temperature within target range for patient.. Well-nourished and well-hydrated in no acute distress. Eyes conjunctiva clear no eyelid edema noted. pupils equal round and reactive to light and accommodation. Ears, Nose, Mouth, and Throat no gross abnormality of ear  auricles or external auditory canals. normal hearing noted during conversation. mucus membranes moist. Respiratory normal breathing without difficulty. Musculoskeletal normal gait and posture. no significant deformity or arthritic changes, no loss or range of motion, no clubbing.  Psychiatric this patient is able to make decisions and demonstrates good insight into disease process. Alert and Oriented x 3. pleasant and cooperative. Notes Upon inspection patient's wound bed actually showed signs of good granulation and epithelization as far as what I am seeing underneath the right breast location. There does not appear to be any evidence of infection right now which is great news and overall very pleased in that regard. With that being said I think moisture is playing a component here as far as drainage is concerned and subsequently I do believe that the patient would benefit from possibly a silver alginate dressing to help this area heal more effectively and quickly. Electronic Signature(s) Signed: 06/04/2020 3:02:44 PM By: Worthy Keeler PA-C Entered By: Worthy Keeler on 06/04/2020 15:02:44 -------------------------------------------------------------------------------- Physician Orders Details Patient Name: Date of Service: Deanna Levine, Deanna Levine 06/04/2020 1:15 PM Medical Record Number: [id number] Patient Account Number: [id number] Date of Birth/Sex: Treating RN: 15-Jan-2001 (20 y.o. Female) Baruch Gouty Primary Care Provider: Pricilla Holm Other Clinician: Referring Provider: Treating Provider/Extender: Dreama Saa in Treatment: 0 Verbal / Phone Orders: No Diagnosis Coding ICD-10 Coding Code Description L73.2 Hidradenitis suppurativa L98.492 Non-pressure chronic ulcer of skin of other sites with fat layer exposed E11.622 Type 2 diabetes mellitus with other skin ulcer R13.19 Other dysphagia Follow-up Appointments Return Appointment in 2  weeks. Bathing/ Shower/ Hygiene May shower and wash wound with soap and water. Wound Treatment Wound #1 - Breast Wound Laterality: Right, Proximal Prim Dressing: KerraCel Ag Gelling Fiber Dressing, 2x2 in (silver alginate) (DME) (Dispense As Written) 1 x Per Day/30 Days ary Discharge Instructions: Apply silver alginate to wound bed as instructed Secondary Dressing: Bordered Gauze, 2x3.75 in (DME) (Generic) 1 x Per Day/30 Days Discharge Instructions: Apply over primary dressing as directed. Wound #2 - Breast Wound Laterality: Right, Distal Prim Dressing: KerraCel Ag Gelling Fiber Dressing, 2x2 in (silver alginate) (DME) (Dispense As Written) 1 x Per Day/30 Days ary Discharge Instructions: Apply silver alginate to wound bed as instructed Secondary Dressing: Bordered Gauze, 2x3.75 in (DME) (Generic) 1 x Per Day/30 Days Discharge Instructions: Apply over primary dressing as directed. [facility] - Hidradenitis clinic to evaluate and treat hidradenitis Electronic Signature(s) Signed: 06/04/2020 5:46:22 PM By: Worthy Keeler PA-C Signed: 06/04/2020 5:59:57 PM By: Baruch Gouty RN, BSN Entered By: Baruch Gouty on 06/04/2020 14:55:38 Prescription 06/04/2020 -------------------------------------------------------------------------------- Bascom Levels PA Patient Name: Provider: 21-Apr-2000 [id number] Date of Birth: NPI#: Female [id number] Sex: DEA #: [phone] Phone #: License #: [place] Patient Address: [facility] [address] [place], [place] [address] [place], [place] [address] [phone] Allergies No Known Drug Allergies Provider's Orders [facility] - Hidradenitis clinic to evaluate and treat hidradenitis Hand Signature: Date(s): Electronic Signature(s) Signed: 06/04/2020 5:46:22 PM By: Worthy Keeler PA-C Signed: 06/04/2020 5:59:57 PM By: Baruch Gouty RN, BSN Entered By:  Baruch Gouty on 06/04/2020 14:55:39 -------------------------------------------------------------------------------- Problem List Details Patient Name: Date of Service: LATOYIA, TECSON 06/04/2020 1:15 PM Medical Record Number: [id number] Patient Account Number: [id number] Date of Birth/Sex: Treating RN: Apr 02, 2000 (20 y.o. Female) Baruch Gouty Primary Care Provider: Pricilla Holm Other Clinician: Referring Provider: Treating Provider/Extender: Dreama Saa in Treatment: 0 Active Problems ICD-10 Encounter Code Description Active Date MDM Diagnosis L73.2 Hidradenitis suppurativa 06/04/2020 No Yes L98.492 Non-pressure chronic ulcer of skin of other sites with fat layer exposed 06/04/2020 No Yes E11.622 Type 2 diabetes  mellitus with other skin ulcer 06/04/2020 No Yes R13.19 Other dysphagia 06/04/2020 No Yes Inactive Problems Resolved Problems Electronic Signature(s) Signed: 06/04/2020 2:45:24 PM By: Worthy Keeler PA-C Entered By: Worthy Keeler on 06/04/2020 14:45:24 -------------------------------------------------------------------------------- Progress Note Details Patient Name: Date of Service: Deanna Levine, Deanna Levine 06/04/2020 1:15 PM Medical Record Number: [id number] Patient Account Number: [id number] Date of Birth/Sex: Treating RN: [birth year] (20 y.o. Female) Baruch Gouty Primary Care Provider: Pricilla Holm Other Clinician: Referring Provider: Treating Provider/Extender: Dreama Saa in Treatment: 0 Subjective Chief Complaint Information obtained from Patient Hidradenitis Right Breast History of Present Illness (HPI) 06/04/2020 patient presents today for initial evaluation here in clinic concerning issues she has been having with her under her arms, under the breast, in the suprapubic region, and on her upper back as well with issues which are consistent with hidradenitis though I think it is more on the  milder side. Her and her mother have been on top of this trying to keep things under control. Her mother also has issues with hidradenitis though not severely either. This does seem to potentially be something that runs in the family. Nonetheless fortunately there does not appear to be any evidence of significant infection at this point. The wound which is actually under the right breast location actually does not appear to be infected right now which is great news. With that being said I do see evidence currently of scarring in multiple locations which show that the patient has obviously had issues in the past that several other locations as well. All in the same regions. Nonetheless she does not have a tremendous amount of pain noted at this point which is good news. She does have some mild mental disability according to her mother she does have mild dysphagia as well which requires that she has to either take liquid or crushed antibiotics when she does get those. Her most recent hemoglobin A1c was 6.6. Currently this episode has been going on for about 6 months ago again 3 years as the total length of time she has been dealing with this. She has never seen a hidradenitis clinic. Patient History Information obtained from Patient. Allergies No Known Drug Allergies Family History Diabetes - Maternal Grandparents,Paternal Grandparents,Mother,Father, Heart Disease - Mother, Hypertension - Paternal Aeronautical engineer, Stroke - Paternal Grandparents, No family history of Cancer, Hereditary Spherocytosis, Kidney Disease, Lung Disease, Seizures, Thyroid Problems, Tuberculosis. Social History Never smoker, Marital Status - Single, Alcohol Use - Never, Drug Use - No History, Caffeine Use - Never. Medical History Eyes Denies history of Cataracts, Glaucoma, Optic Neuritis Ear/Nose/Mouth/Throat Patient has history of Chronic sinus problems/congestion Denies history of Middle ear  problems Hematologic/Lymphatic Denies history of Anemia, Hemophilia, Human Immunodeficiency Virus, Lymphedema, Sickle Cell Disease Respiratory Denies history of Aspiration, Asthma, Chronic Obstructive Pulmonary Disease (COPD), Pneumothorax, Sleep Apnea, Tuberculosis Cardiovascular Denies history of Angina, Arrhythmia, Congestive Heart Failure, Coronary Artery Disease, Deep Vein Thrombosis, Hypertension, Hypotension, Myocardial Infarction, Peripheral Arterial Disease, Peripheral Venous Disease, Phlebitis, Vasculitis Gastrointestinal Denies history of Cirrhosis , Colitis, Crohnoos, Hepatitis A, Hepatitis B, Hepatitis C Endocrine Patient has history of Type II Diabetes Denies history of Type I Diabetes Genitourinary Denies history of End Stage Renal Disease Immunological Denies history of Lupus Erythematosus, Raynaudoos, Scleroderma Integumentary (Skin) Denies history of History of Burn Musculoskeletal Patient has history of Osteoarthritis - right foot Denies history of Gout, Rheumatoid Arthritis, Osteomyelitis Neurologic Patient has history of Seizure Disorder Denies history of Dementia, Neuropathy, Quadriplegia, Paraplegia Oncologic Denies history  of Received Chemotherapy, Received Radiation Psychiatric Denies history of Anorexia/bulimia, Confinement Anxiety Patient is treated with Insulin. Blood sugar is tested. Hospitalization/Surgery History - 04/25/2020 bilateral achills tendon release. - 2005 tonsil and adenoids removed. 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 Eyes Complains or has symptoms of Glasses / Contacts - glasses. Denies complaints or symptoms of Dry Eyes, Vision  Changes. Ear/Nose/Mouth/Throat Denies complaints or symptoms of Chronic sinus problems or rhinitis. Respiratory Denies complaints or symptoms of Chronic or frequent coughs, Shortness of Breath. Cardiovascular Denies complaints or symptoms of Chest pain. Gastrointestinal Denies complaints or symptoms of Frequent diarrhea, Nausea, Vomiting. Endocrine Denies complaints or symptoms of Heat/cold intolerance. Genitourinary Denies complaints or symptoms of Frequent urination. Integumentary (Skin) Complains or has symptoms of Wounds - under breast. Musculoskeletal Denies complaints or symptoms of Muscle Pain, Muscle Weakness. Neurologic Denies complaints or symptoms of Numbness/parasthesias. Psychiatric Denies complaints or symptoms of Claustrophobia, Suicidal. Objective Constitutional sitting or standing blood pressure is within target range for patient.. pulse regular and within target range for patient.Marland Kitchen respirations regular, non-labored and within target range for patient.Marland Kitchen temperature within target range for patient.. Well-nourished and well-hydrated in no acute distress. Vitals Time Taken: 1:34 PM, Height: 64 in, Source: Stated, Weight: 214 lbs, Source: Stated, BMI: 36.7, Temperature: 97.8 F, Pulse: 99 bpm, Respiratory Rate: 20 breaths/min, Blood Pressure: 104/77 mmHg, Capillary Blood Glucose: 130 mg/dl. Eyes conjunctiva clear no eyelid edema noted. pupils equal round and reactive to light and accommodation. Ears, Nose, Mouth, and Throat no gross abnormality of ear auricles or external auditory canals. normal hearing noted during conversation. mucus membranes moist. Respiratory normal breathing without difficulty. Musculoskeletal normal gait and posture. no significant deformity or arthritic changes, no loss or range of motion, no clubbing. Psychiatric this patient is able to make decisions and demonstrates good insight into disease process. Alert and Oriented x 3. pleasant and  cooperative. General Notes: Upon inspection patient's wound bed actually showed signs of good granulation and epithelization as far as what I am seeing underneath the right breast location. There does not appear to be any evidence of infection right now which is great news and overall very pleased in that regard. With that being said I think moisture is playing a component here as far as drainage is concerned and subsequently I do believe that the patient would benefit from possibly a silver alginate dressing to help this area heal more effectively and quickly. Integumentary (Hair, Skin) Wound #1 status is Open. Original cause of wound was Gradually Appeared. The date acquired was: 11/30/2019. The wound is located on the Right,Proximal Breast. The wound measures 0.2cm length x 0.3cm width x 0.1cm depth; 0.047cm^2 area and 0.005cm^3 volume. There is Fat Layer (Subcutaneous Tissue) exposed. There is no tunneling or undermining noted. There is a medium amount of serosanguineous drainage noted. The wound margin is distinct with the outline attached to the wound base. There is large (67-100%) red granulation within the wound bed. There is a small (1-33%) amount of necrotic tissue within the wound bed including Adherent Slough. Wound #2 status is Open. Original cause of wound was Gradually Appeared. The date acquired was: 11/30/2019. The wound is located on the Right,Distal Breast. The wound measures 0.3cm length x 0.4cm width x 0.1cm depth; 0.094cm^2 area and 0.009cm^3 volume. There is Fat Layer (Subcutaneous Tissue)  exposed. There is no tunneling or undermining noted. There is a medium amount of serosanguineous drainage noted. The wound margin is distinct with the outline attached to the wound base. There is large (67-100%) red granulation within the wound bed. There is a small (1-33%) amount of necrotic tissue within the wound bed including Adherent Slough. Assessment Active  Problems ICD-10 Hidradenitis suppurativa Non-pressure chronic ulcer of skin of other sites with fat layer exposed Type 2 diabetes mellitus with other skin ulcer Other dysphagia Plan Follow-up Appointments: Return Appointment in 2 weeks. Bathing/ Shower/ Hygiene: May shower and wash wound with soap and water. Consults ordered were: [facility] - Hidradenitis clinic to evaluate and treat hidradenitis WOUND #1: - Breast Wound Laterality: Right, Proximal Prim Dressing: KerraCel Ag Gelling Fiber Dressing, 2x2 in (silver alginate) (DME) (Dispense As Written) 1 x Per Day/30 Days ary Discharge Instructions: Apply silver alginate to wound bed as instructed Secondary Dressing: Bordered Gauze, 2x3.75 in (DME) (Generic) 1 x Per Day/30 Days Discharge Instructions: Apply over primary dressing as directed. WOUND #2: - Breast Wound Laterality: Right, Distal Prim Dressing: KerraCel Ag Gelling Fiber Dressing, 2x2 in (silver alginate) (DME) (Dispense As Written) 1 x Per Day/30 Days ary Discharge Instructions: Apply silver alginate to wound bed as instructed Secondary Dressing: Bordered Gauze, 2x3.75 in (DME) (Generic) 1 x Per Day/30 Days Discharge Instructions: Apply over primary dressing as directed. 1. Would recommend we going continue with the wound care measures as noted above which will be to initiate treatment with a silver alginate dressing followed by border gauze dressing to secure in place. 2. I am also can recommend the patient may shower daily I do not see any issues with that I think washing with soap and water is also fine. She be using Hibiclens which I think also can be beneficial. 3. I am also going to recommend a referral to the hidradenitis clinic I think considering this is been going on for 3 years intermittently and her mother states it seems to be getting worse each time I think that is definitely worth that referral to see what they have to say and hopefully prevent this from  being a recurrent and ongoing thing for her. We will see patient back for reevaluation in 2 weeks here in the clinic. If anything worsens or changes patient will contact our office for additional recommendations. Electronic Signature(s) Signed: 06/04/2020 3:03:39 PM By: Worthy Keeler PA-C Entered By: Worthy Keeler on 06/04/2020 15:03:39 -------------------------------------------------------------------------------- HxROS Details Patient Name: Date of Service: Deanna Levine, Deanna Levine 06/04/2020 1:15 PM Medical Record Number: [id number] Patient Account Number: [id number] Date of Birth/Sex: Treating RN: 04-19-2000 (20 y.o. Female) Deon Pilling Primary Care Provider: Pricilla Holm Other Clinician: Referring Provider: Treating Provider/Extender: Dreama Saa in Treatment: 0 Information Obtained From Patient Constitutional Symptoms (General Health) Complaints and Symptoms: Negative for: Fatigue; Fever; Chills; Marked Weight Change Medical History: Past Medical History Notes: dyspraxia Dysphagia with pills achilles tendon contracture bilateral-surgery 04/25/2020 Eyes Complaints and Symptoms: Positive for: Glasses / Contacts - glasses Negative for: Dry Eyes; Vision Changes Medical History: Negative for: Cataracts; Glaucoma; Optic Neuritis Ear/Nose/Mouth/Throat Complaints and Symptoms: Negative for: Chronic sinus problems or rhinitis Medical History: Positive for: Chronic sinus problems/congestion Negative for: Middle ear problems Respiratory Complaints and Symptoms: Negative for: Chronic or frequent coughs; Shortness of Breath Medical History: Negative for: Aspiration; Asthma; Chronic Obstructive Pulmonary Disease (COPD); Pneumothorax; Sleep Apnea; Tuberculosis Cardiovascular Complaints and Symptoms: Negative for: Chest pain Medical History: Negative for: Angina;  Arrhythmia; Congestive Heart Failure; Coronary Artery Disease; Deep Vein  Thrombosis; Hypertension; Hypotension; Myocardial Infarction; Peripheral Arterial Disease; Peripheral Venous Disease; Phlebitis; Vasculitis Gastrointestinal Complaints and Symptoms: Negative for: Frequent diarrhea; Nausea; Vomiting Medical History: Negative for: Cirrhosis ; Colitis; Crohns; Hepatitis A; Hepatitis B; Hepatitis C Endocrine Complaints and Symptoms: Negative for: Heat/cold intolerance Medical History: Positive for: Type II Diabetes Negative for: Type I Diabetes Time with diabetes: 3 years ago Treated with: Insulin Blood sugar tested every day: Yes Tested : continuous Genitourinary Complaints and Symptoms: Negative for: Frequent urination Medical History: Negative for: End Stage Renal Disease Integumentary (Skin) Complaints and Symptoms: Positive for: Wounds - under breast Medical History: Negative for: History of Burn Musculoskeletal Complaints and Symptoms: Negative for: Muscle Pain; Muscle Weakness Medical History: Positive for: Osteoarthritis - right foot Negative for: Gout; Rheumatoid Arthritis; Osteomyelitis Neurologic Complaints and Symptoms: Negative for: Numbness/parasthesias Medical History: Positive for: Seizure Disorder Negative for: Dementia; Neuropathy; Quadriplegia; Paraplegia Psychiatric Complaints and Symptoms: Negative for: Claustrophobia; Suicidal Medical History: Negative for: Anorexia/bulimia; Confinement Anxiety Hematologic/Lymphatic Medical History: Negative for: Anemia; Hemophilia; Human Immunodeficiency Virus; Lymphedema; Sickle Cell Disease Immunological Medical History: Negative for: Lupus Erythematosus; Raynauds; Scleroderma Oncologic Medical History: Negative for: Received Chemotherapy; Received Radiation HBO Extended History Items Ear/Nose/Mouth/Throat: Chronic sinus problems/congestion Immunizations Pneumococcal Vaccine: Received Pneumococcal Vaccination: Yes Implantable Devices No devices added Hospitalization /  Surgery History Type of Hospitalization/Surgery 04/25/2020 bilateral achills tendon release 2005 tonsil and adenoids removed Family and Social History Cancer: No; Diabetes: Yes - Maternal Grandparents,Paternal Grandparents,Mother,Father; Heart Disease: Yes - Mother; Hereditary Spherocytosis: No; Hypertension: Yes - Paternal Grandparents,Maternal Grandparents,Father; Kidney Disease: No; Lung Disease: No; Seizures: No; Stroke: Yes - Paternal Grandparents; Thyroid Problems: No; Tuberculosis: No; Never smoker; Marital Status - Single; Alcohol Use: Never; Drug Use: No History; Caffeine Use: Never; Financial Concerns: No; Food, Clothing or Shelter Needs: No; Support System Lacking: No; Transportation Concerns: No Electronic Signature(s) Signed: 06/04/2020 5:46:22 PM By: Worthy Keeler PA-C Signed: 06/04/2020 5:48:55 PM By: Deon Pilling Entered By: Deon Pilling on 06/04/2020 14:01:06 -------------------------------------------------------------------------------- SuperBill Details Patient Name: Date of Service: Deanna Levine, Deanna Levine 06/04/2020 Medical Record Number: [id number] Patient Account Number: [id number] Date of Birth/Sex: Treating RN: October 11, 2000 (20 y.o. Female) Baruch Gouty Primary Care Provider: Pricilla Holm Other Clinician: Referring Provider: Treating Provider/Extender: Dreama Saa in Treatment: 0 Diagnosis Coding ICD-10 Codes Code Description L73.2 Hidradenitis suppurativa L98.492 Non-pressure chronic ulcer of skin of other sites with fat layer exposed E11.622 Type 2 diabetes mellitus with other skin ulcer R13.19 Other dysphagia Facility Procedures CPT4 Code: [id number] Description: 99214 - WOUND CARE VISIT-LEV 4 EST PT Modifier: Quantity: 1 Physician Procedures : CPT4 Code Description Modifier [id number] - WC PHYS LEVEL 3 - EST PT ICD-10 Diagnosis Description L73.2 Hidradenitis suppurativa L98.492 Non-pressure chronic ulcer of skin of  other sites with fat layer exposed E11.622 Type 2 diabetes mellitus with  other skin ulcer R13.19 Other dysphagia Quantity: 1 Electronic Signature(s) Signed: 06/04/2020 5:46:34 PM By: Worthy Keeler PA-C Signed: 06/04/2020 5:59:57 PM By: Baruch Gouty RN, BSN Previous Signature: 06/04/2020 3:03:50 PM Version By: Worthy Keeler PA-C Entered By: Baruch Gouty on 06/04/2020 17:46:26

## 2020-06-04 NOTE — Progress Notes (Signed)
Deanna Levine, Deanna Levine ([id number]) Visit Report for 06/04/2020 Abuse/Suicide Risk Screen Details Patient Name: Date of Service: Deanna Levine, Deanna Levine 06/04/2020 1:15 PM Medical Record Number: [id number] Patient Account Number: [id number] Date of Birth/Sex: Treating RN: 01-16-2001 (20 y.o. Female) Deon Pilling Primary Care Raziyah Vanvleck: Pricilla Holm Other Clinician: Referring Jomayra Novitsky: Treating Anjelita Sheahan/Extender: Dreama Saa in Treatment: 0 Abuse/Suicide Risk Screen Items Answer ABUSE RISK SCREEN: Has anyone close to you tried to hurt or harm you recentlyo No Do you feel uncomfortable with anyone in your familyo No Has anyone forced you do things that you didnt want to doo No Electronic Signature(s) Signed: 06/04/2020 5:48:55 PM By: Deon Pilling Entered By: Deon Pilling on 06/04/2020 13:36:21 -------------------------------------------------------------------------------- Activities of Daily Living Details Patient Name: Date of Service: Deanna Levine, Deanna Levine 06/04/2020 1:15 PM Medical Record Number: [id number] Patient Account Number: [id number] Date of Birth/Sex: Treating RN: August 19, 2000 (20 y.o. Female) Deon Pilling Primary Care Jaida Basurto: Pricilla Holm Other Clinician: Referring Kimsey Demaree: Treating Hatley Henegar/Extender: Dreama Saa in Treatment: 0 Activities of Daily Living Items Answer Activities of Daily Living (Please select one for each item) Drive Automobile Not Able T Medications ake Completely Able Use T elephone Completely Able Care for Appearance Completely Able Use T oilet Completely Able Bath / Shower Completely Able Dress Self Completely Able Feed Self Completely Able Walk Need Assistance Get In / Out Bed Completely Able Housework Completely Able Prepare Meals Completely [place] for Self Completely Able Electronic Signature(s) Signed: 06/04/2020 5:48:55 PM By: Deon Pilling Entered  By: Deon Pilling on 06/04/2020 13:40:51 -------------------------------------------------------------------------------- Education Screening Details Patient Name: Date of Service: Deanna Levine, Deanna Levine 06/04/2020 1:15 PM Medical Record Number: [id number] Patient Account Number: [id number] Date of Birth/Sex: Treating RN: 11/30/2000 (20 y.o. Female) Deon Pilling Primary Care Brittony Billick: Pricilla Holm Other Clinician: Referring Dajiah Kooi: Treating Jacier Gladu/Extender: Dreama Saa in Treatment: 0 Primary Learner Assessed: Patient Learning Preferences/Education Level/Primary Language Learning Preference: Explanation, Demonstration, Printed Material Highest Education Level: High School Preferred Language: English Cognitive Barrier Language Barrier: No Translator Needed: No Memory Deficit: No Emotional Barrier: No Cultural/Religious Beliefs Affecting Medical Care: No Physical Barrier Impaired Vision: Yes Glasses Impaired Hearing: No Decreased Hand dexterity: No Knowledge/Comprehension Knowledge Level: High Comprehension Level: High Ability to understand written instructions: High Ability to understand verbal instructions: High Motivation Anxiety Level: Calm Cooperation: Cooperative Education Importance: Acknowledges Need Interest in Health Problems: Asks Questions Perception: Coherent Willingness to Engage in Self-Management High Activities: Readiness to Engage in Self-Management High Activities: Electronic Signature(s) Signed: 06/04/2020 5:48:55 PM By: Deon Pilling Entered By: Deon Pilling on 06/04/2020 13:41:35 -------------------------------------------------------------------------------- Fall Risk Assessment Details Patient Name: Date of Service: Deanna Levine, Deanna Levine 06/04/2020 1:15 PM Medical Record Number: [id number] Patient Account Number: [id number] Date of Birth/Sex: Treating RN: [birth year] (20 y.o. Female) Deon Pilling Primary Care  Keyani Rigdon: Pricilla Holm Other Clinician: Referring Telina Kleckley: Treating Raquan Iannone/Extender: Dreama Saa in Treatment: 0 Fall Risk Assessment Items Have you had 2 or more falls in the last 12 monthso 0 No Have you had any fall that resulted in injury in the last 12 monthso 0 No FALLS RISK SCREEN History of falling - immediate or within 3 months 0 No Secondary diagnosis (Do you have 2 or more medical diagnoseso) 0 No Ambulatory aid None/bed rest/wheelchair/nurse 0 No Crutches/cane/walker 15 Yes Furniture 0 No Intravenous therapy Access/Saline/Heparin Lock 0 No Gait/Transferring Normal/ bed rest/ wheelchair 0 Yes Weak (short steps with or without shuffle, stooped but able  to lift head while walking, may seek 0 No support from furniture) Impaired (short steps with shuffle, may have difficulty arising from chair, head down, impaired 0 No balance) Mental Status Oriented to own ability 0 Yes Electronic Signature(s) Signed: 06/04/2020 5:48:55 PM By: Deon Pilling Entered By: Deon Pilling on 06/04/2020 13:41:59 -------------------------------------------------------------------------------- Foot Assessment Details Patient Name: Date of Service: Deanna Levine, Deanna Levine 06/04/2020 1:15 PM Medical Record Number: [id number] Patient Account Number: [id number] Date of Birth/Sex: Treating RN: 11-09-2000 (20 y.o. Female) Deon Pilling Primary Care Anajah Sterbenz: Pricilla Holm Other Clinician: Referring Roselynne Lortz: Treating Ermalee Mealy/Extender: Dreama Saa in Treatment: 0 Foot Assessment Items Site Locations + = Sensation present, - = Sensation absent, C = Callus, U = Ulcer R = Redness, W = Warmth, M = Maceration, PU = Pre-ulcerative lesion F = Fissure, S = Swelling, D = Dryness Assessment Right: Left: Other Deformity: No No Prior Foot Ulcer: No No Prior Amputation: No No Charcot Joint: No No Ambulatory Status: Ambulatory With  Help Assistance Device: Walker Gait: Steady Notes No BLE wounds. Electronic Signature(s) Signed: 06/04/2020 5:48:55 PM By: Deon Pilling Entered By: Deon Pilling on 06/04/2020 13:42:28 -------------------------------------------------------------------------------- Nutrition Risk Screening Details Patient Name: Date of Service: Deanna Levine, Deanna Levine 06/04/2020 1:15 PM Medical Record Number: [id number] Patient Account Number: [id number] Date of Birth/Sex: Treating RN: 2000-06-03 (20 y.o. Female) Deon Pilling Primary Care Lynsee Wands: Pricilla Holm Other Clinician: Referring Shellby Schlink: Treating Brandley Aldrete/Extender: Dreama Saa in Treatment: 0 Height (in): 64 Weight (lbs): 214 Body Mass Index (BMI): 36.7 Nutrition Risk Screening Items Score Screening NUTRITION RISK SCREEN: I have an illness or condition that made me change the kind and/or amount of food I eat 2 Yes I eat fewer than two meals per day 0 No I eat few fruits and vegetables, or milk products 0 No I have three or more drinks of beer, liquor or wine almost every day 0 No I have tooth or mouth problems that make it hard for me to eat 0 No I don't always have enough money to buy the food I need 0 No I eat alone most of the time 0 No I take three or more different prescribed or over-the-counter drugs a day 1 Yes Without wanting to, I have lost or gained 10 pounds in the last six months 0 No I am not always physically able to shop, cook and/or feed myself 0 No Nutrition Protocols Good Risk Protocol Provide education on elevated blood Moderate Risk Protocol 0 sugars and impact on wound healing, as applicable High Risk Proctocol Risk Level: Moderate Risk Score: 3 Electronic Signature(s) Signed: 06/04/2020 5:48:55 PM By: Deon Pilling Entered By: Deon Pilling on 06/04/2020 13:42:09

## 2020-06-05 DIAGNOSIS — F79 Unspecified intellectual disabilities: Secondary | ICD-10-CM | POA: Insufficient documentation

## 2020-06-05 DIAGNOSIS — L732 Hidradenitis suppurativa: Secondary | ICD-10-CM | POA: Insufficient documentation

## 2020-06-05 DIAGNOSIS — M2170 Unequal limb length (acquired), unspecified site: Secondary | ICD-10-CM | POA: Insufficient documentation

## 2020-06-05 NOTE — Progress Notes (Signed)
Deanna Levine, Deanna Levine ([id number]) Visit Report for 06/04/2020 Allergy List Details Patient Name: Date of Service: Deanna Levine, Deanna Levine 06/04/2020 1:15 PM Medical Record Number: [id number] Patient Account Number: [id number] Date of Birth/Sex: Treating RN: 03-04-2000 (20 y.o. Female) Deon Pilling Primary Care Kolson Chovanec: Pricilla Holm Other Clinician: Referring Oleg Oleson: Treating Averyanna Sax/Extender: Dreama Saa in Treatment: 0 Allergies Active Allergies No Known Drug Allergies Allergy Notes Electronic Signature(s) Signed: 06/04/2020 5:48:55 PM By: Deon Pilling Entered By: Deon Pilling on 06/04/2020 13:35:55 -------------------------------------------------------------------------------- Arrival Information Details Patient Name: Date of Service: Deanna Levine, Deanna Levine 06/04/2020 1:15 PM Medical Record Number: [id number] Patient Account Number: [id number] Date of Birth/Sex: Treating RN: [birth year] (20 y.o. Female) Deon Pilling Primary Care Kadrian Partch: Pricilla Holm Other Clinician: Referring Orvil Faraone: Treating Barbette Mcglaun/Extender: Dreama Saa in Treatment: 0 Visit Information Patient Arrived: Charlyn Minerva Time: 13:34 Accompanied By: mother Transfer Assistance: None Patient Identification Verified: Yes Secondary Verification Process Completed: Yes Patient Requires Transmission-Based Precautions: No Patient Has Alerts: No Electronic Signature(s) Signed: 06/04/2020 5:48:55 PM By: Deon Pilling Entered By: Deon Pilling on 06/04/2020 13:35:45 -------------------------------------------------------------------------------- Clinic Level of Care Assessment Details Patient Name: Date of Service: Deanna Levine, Deanna Levine 06/04/2020 1:15 PM Medical Record Number: [id number] Patient Account Number: [id number] Date of Birth/Sex: Treating RN: 01-Aug-2000 (20 y.o. Female) Baruch Gouty Primary Care Akil Hoos: Pricilla Holm Other  Clinician: Referring Lashanta Elbe: Treating Caelen Reierson/Extender: Dreama Saa in Treatment: 0 Clinic Level of Care Assessment Items TOOL 2 Quantity Score []  - 0 Use when only an EandM is performed on the INITIAL visit ASSESSMENTS - Nursing Assessment / Reassessment X- 1 20 General Physical Exam (combine w/ comprehensive assessment (listed just below) when performed on new pt. evals) X- 1 25 Comprehensive Assessment (HX, ROS, Risk Assessments, Wounds Hx, etc.) ASSESSMENTS - Wound and Skin A ssessment / Reassessment []  - 0 Simple Wound Assessment / Reassessment - one wound X- 2 5 Complex Wound Assessment / Reassessment - multiple wounds []  - 0 Dermatologic / Skin Assessment (not related to wound area) ASSESSMENTS - Ostomy and/or Continence Assessment and Care []  - 0 Incontinence Assessment and Management []  - 0 Ostomy Care Assessment and Management (repouching, etc.) PROCESS - Coordination of Care X - Simple Patient / Family Education for ongoing care 1 15 []  - 0 Complex (extensive) Patient / Family Education for ongoing care X- 1 10 Staff obtains Programmer, systems, Records, T Results / Process Orders est []  - 0 Staff telephones HHA, Nursing Homes / Clarify orders / etc []  - 0 Routine Transfer to another Facility (non-emergent condition) []  - 0 Routine Hospital Admission (non-emergent condition) X- 1 15 New Admissions / Biomedical engineer / Ordering NPWT Apligraf, etc. , []  - 0 Emergency Hospital Admission (emergent condition) X- 1 10 Simple Discharge Coordination []  - 0 Complex (extensive) Discharge Coordination PROCESS - Special Needs []  - 0 Pediatric / Minor Patient Management []  - 0 Isolation Patient Management []  - 0 Hearing / Language / Visual special needs []  - 0 Assessment of Community assistance (transportation, D/C planning, etc.) []  - 0 Additional assistance / Altered mentation []  - 0 Support Surface(s) Assessment (bed, cushion,  seat, etc.) INTERVENTIONS - Wound Cleansing / Measurement X- 1 5 Wound Imaging (photographs - any number of wounds) []  - 0 Wound Tracing (instead of photographs) []  - 0 Simple Wound Measurement - one wound X- 2 5 Complex Wound Measurement - multiple wounds []  - 0 Simple Wound Cleansing - one wound X- 2 5 Complex Wound Cleansing - multiple  wounds INTERVENTIONS - Wound Dressings X - Small Wound Dressing one or multiple wounds 2 10 []  - 0 Medium Wound Dressing one or multiple wounds []  - 0 Large Wound Dressing one or multiple wounds []  - 0 Application of Medications - injection INTERVENTIONS - Miscellaneous []  - 0 External ear exam []  - 0 Specimen Collection (cultures, biopsies, blood, body fluids, etc.) []  - 0 Specimen(s) / Culture(s) sent or taken to Lab for analysis []  - 0 Patient Transfer (multiple staff / Harrel Lemon Lift / Similar devices) []  - 0 Simple Staple / Suture removal (25 or less) []  - 0 Complex Staple / Suture removal (26 or more) []  - 0 Hypo / Hyperglycemic Management (close monitor of Blood Glucose) []  - 0 Ankle / Brachial Index (ABI) - do not check if billed separately Has the patient been seen at the hospital within the last three years: Yes Total Score: 150 Level Of Care: New/Established - Level 4 Electronic Signature(s) Signed: 06/04/2020 5:59:57 PM By: Baruch Gouty RN, BSN Entered By: Baruch Gouty on 06/04/2020 17:46:08 -------------------------------------------------------------------------------- Encounter Discharge Information Details Patient Name: Date of Service: Deanna Levine, Deanna Levine 06/04/2020 1:15 PM Medical Record Number: [id number] Patient Account Number: [id number] Date of Birth/Sex: Treating RN: 2000/09/15 (20 y.o. Female) Rhae Hammock Primary Care Nazar Kuan: Pricilla Holm Other Clinician: Referring Szymon Foiles: Treating Nautika Cressey/Extender: Dreama Saa in Treatment: 0 Encounter Discharge Information  Items Discharge Condition: Stable Ambulatory Status: Ambulatory Discharge Destination: Home Transportation: Private Auto Accompanied By: self Schedule Follow-up Appointment: Yes Clinical Summary of Care: Patient Declined Electronic Signature(s) Signed: 06/04/2020 5:28:50 PM By: Rhae Hammock RN Entered By: Rhae Hammock on 06/04/2020 15:04:46 -------------------------------------------------------------------------------- Lower Extremity Assessment Details Patient Name: Date of Service: Deanna Levine, Deanna Levine 06/04/2020 1:15 PM Medical Record Number: [id number] Patient Account Number: [id number] Date of Birth/Sex: Treating RN: 12-Jan-2001 (20 y.o. Female) Deon Pilling Primary Care Chelcy Bolda: Pricilla Holm Other Clinician: Referring Alexiah Koroma: Treating Mohamadou Maciver/Extender: Dreama Saa in Treatment: 0 Electronic Signature(s) Signed: 06/04/2020 5:48:55 PM By: Deon Pilling Signed: 06/04/2020 5:48:55 PM By: Deon Pilling Entered By: Deon Pilling on 06/04/2020 13:42:34 -------------------------------------------------------------------------------- [place] Details Patient Name: Date of Service: Deanna Levine, Deanna Levine 06/04/2020 1:15 PM Medical Record Number: [id number] Patient Account Number: [id number] Date of Birth/Sex: Treating RN: 2000/11/16 (20 y.o. Female) Baruch Gouty Primary Care Kellan Boehlke: Pricilla Holm Other Clinician: Referring Thereasa Iannello: Treating Sanuel Ladnier/Extender: Dreama Saa in Treatment: 0 Multidisciplinary Care Plan reviewed with physician Active Inactive Soft Tissue Infection Nursing Diagnoses: Impaired tissue integrity Potential for infection: soft tissue Goals: Patient's soft tissue infection will resolve Date Initiated: 06/04/2020 Target Resolution Date: 07/02/2020 Goal Status: Active Interventions: Assess signs and symptoms of infection every visit Provide education on  infection Notes: Wound/Skin Impairment Nursing Diagnoses: Impaired tissue integrity Knowledge deficit related to ulceration/compromised skin integrity Goals: Patient/caregiver will verbalize understanding of skin care regimen Date Initiated: 06/04/2020 Target Resolution Date: 07/02/2020 Goal Status: Active Ulcer/skin breakdown will have a volume reduction of 30% by week 4 Date Initiated: 06/04/2020 Target Resolution Date: 07/02/2020 Goal Status: Active Interventions: Assess patient/caregiver ability to obtain necessary supplies Assess patient/caregiver ability to perform ulcer/skin care regimen upon admission and as needed Assess ulceration(s) every visit Provide education on ulcer and skin care Treatment Activities: Topical wound management initiated : 06/04/2020 Notes: Electronic Signature(s) Signed: 06/04/2020 5:59:57 PM By: Baruch Gouty RN, BSN Entered By: Baruch Gouty on 06/04/2020 14:23:35 -------------------------------------------------------------------------------- Pain Assessment Details Patient Name: Date of Service: Deanna Levine, Deanna Levine 06/04/2020 1:15 PM Medical Record  Number: [id number] Patient Account Number: [id number] Date of Birth/Sex: Treating RN: November 16, 2000 (20 y.o. Female) Deon Pilling Primary Care Makailyn Mccormick: Pricilla Holm Other Clinician: Referring Anala Whisenant: Treating Naiomi Musto/Extender: Dreama Saa in Treatment: 0 Active Problems Location of Pain Severity and Description of Pain Patient Has Paino No Site Locations Pain Management and Medication Current Pain Management: Electronic Signature(s) Signed: 06/04/2020 5:48:55 PM By: Deon Pilling Entered By: Deon Pilling on 06/04/2020 13:42:45 -------------------------------------------------------------------------------- Patient/Caregiver Education Details Patient Name: Date of Service: Deanna Levine 4/6/2022andnbsp1:15 PM Medical Record Number: [id number] Patient  Account Number: [id number] Date of Birth/Gender: Treating RN: 2000/12/29 (20 y.o. Female) Baruch Gouty Primary Care Physician: Pricilla Holm Other Clinician: Referring Physician: Treating Physician/Extender: Dreama Saa in Treatment: 0 Education Assessment Education Provided To: Patient Education Topics Provided Welcome T [facility]: o Handouts: Welcome T [facility] [place] o Methods: Explain/Verbal, Printed Responses: Reinforcements needed, State content correctly Wound/Skin Impairment: Handouts: Caring for Your Ulcer, Skin Care Do's and Dont's Methods: Explain/Verbal, Printed Responses: Reinforcements needed, State content correctly Electronic Signature(s) Signed: 06/04/2020 5:59:57 PM By: Baruch Gouty RN, BSN Entered By: Baruch Gouty on 06/04/2020 14:24:09 -------------------------------------------------------------------------------- Wound Assessment Details Patient Name: Date of Service: Deanna Levine, Deanna Levine 06/04/2020 1:15 PM Medical Record Number: [id number] Patient Account Number: [id number] Date of Birth/Sex: Treating RN: 2000/07/12 (20 y.o. Female) Deon Pilling Primary Care Linda Grimmer: Pricilla Holm Other Clinician: Referring Marquett Bertoli: Treating Wilfrido Luedke/Extender: Dreama Saa in Treatment: 0 Wound Status Wound Number: 1 Primary Hidradenitis Etiology: Wound Location: Right, Proximal Breast Wound Open Wounding Event: Gradually Appeared Status: Date Acquired: 11/30/2019 Comorbid Chronic sinus problems/congestion, Type II Diabetes, Weeks Of Treatment: 0 History: Osteoarthritis, Seizure Disorder Clustered Wound: No Photos Wound Measurements Length: (cm) 0.2 Width: (cm) 0.3 Depth: (cm) 0.1 Area: (cm) 0.047 Volume: (cm) 0.005 % Reduction in Area: 0% % Reduction in Volume: 0% Epithelialization: None Tunneling: No Undermining: No Wound Description Classification:  Full Thickness Without Exposed Support Structures Wound Margin: Distinct, outline attached Exudate Amount: Medium Exudate Type: Serosanguineous Exudate Color: red, brown Foul Odor After Cleansing: No Slough/Fibrino Yes Wound Bed Granulation Amount: Large (67-100%) Exposed Structure Granulation Quality: Red Fascia Exposed: No Necrotic Amount: Small (1-33%) Fat Layer (Subcutaneous Tissue) Exposed: Yes Necrotic Quality: Adherent Slough Tendon Exposed: No Muscle Exposed: No Joint Exposed: No Bone Exposed: No Treatment Notes Wound #1 (Breast) Wound Laterality: Right, Proximal Cleanser Peri-Wound Care Topical Primary Dressing KerraCel Ag Gelling Fiber Dressing, 2x2 in (silver alginate) Discharge Instruction: Apply silver alginate to wound bed as instructed Secondary Dressing Bordered Gauze, 2x3.75 in Discharge Instruction: Apply over primary dressing as directed. Secured With Compression Wrap Compression Stockings Environmental education officer) Signed: 06/04/2020 5:48:55 PM By: Deon Pilling Signed: 06/05/2020 4:58:23 PM By: Sandre Kitty Entered By: Sandre Kitty on 06/04/2020 16:12:20 -------------------------------------------------------------------------------- Wound Assessment Details Patient Name: Date of Service: Deanna Levine, Deanna Levine 06/04/2020 1:15 PM Medical Record Number: [id number] Patient Account Number: [id number] Date of Birth/Sex: Treating RN: 10-04-2000 (20 y.o. Female) Deon Pilling Primary Care Cina Klumpp: Pricilla Holm Other Clinician: Referring Cicero Noy: Treating Leeandre Nordling/Extender: Dreama Saa in Treatment: 0 Wound Status Wound Number: 2 Primary Hidradenitis Etiology: Wound Location: Right, Distal Breast Wound Open Wounding Event: Gradually Appeared Status: Date Acquired: 11/30/2019 Comorbid Chronic sinus problems/congestion, Type II Diabetes, Weeks Of Treatment: 0 History: Osteoarthritis, Seizure  Disorder Clustered Wound: No Photos Wound Measurements Length: (cm) 0.3 Width: (cm) 0.4 Depth: (cm) 0.1 Area: (cm) 0.094 Volume: (cm) 0.009 Wound Description Classification: Full Thickness  Without Exposed Support Structures Wound Margin: Distinct, outline attached Exudate Amount: Medium Exudate Type: Serosanguineous Exudate Color: red, brown Foul Odor After Cleansing: No Slough/Fibrino Ye % Reduction in Area: 0% % Reduction in Volume: 0% Epithelialization: None Tunneling: No Undermining: No s Wound Bed Granulation Amount: Large (67-100%) Exposed Structure Granulation Quality: Red Fascia Exposed: No Necrotic Amount: Small (1-33%) Fat Layer (Subcutaneous Tissue) Exposed: Yes Necrotic Quality: Adherent Slough Tendon Exposed: No Muscle Exposed: No Joint Exposed: No Bone Exposed: No Treatment Notes Wound #2 (Breast) Wound Laterality: Right, Distal Cleanser Peri-Wound Care Topical Primary Dressing KerraCel Ag Gelling Fiber Dressing, 2x2 in (silver alginate) Discharge Instruction: Apply silver alginate to wound bed as instructed Secondary Dressing Bordered Gauze, 2x3.75 in Discharge Instruction: Apply over primary dressing as directed. Secured With Compression Wrap Compression Stockings Environmental education officer) Signed: 06/04/2020 5:48:55 PM By: Deon Pilling Signed: 06/05/2020 4:58:23 PM By: Sandre Kitty Entered By: Sandre Kitty on 06/04/2020 16:12:53 -------------------------------------------------------------------------------- Vitals Details Patient Name: Date of Service: Deanna Levine, Deanna Levine 06/04/2020 1:15 PM Medical Record Number: [id number] Patient Account Number: [id number] Date of Birth/Sex: Treating RN: May 14, 2000 (20 y.o. Female) Deon Pilling Primary Care Janmarie Smoot: Pricilla Holm Other Clinician: Referring Noemie Devivo: Treating Asia Favata/Extender: Dreama Saa in Treatment: 0 Vital Signs Time Taken:  13:34 Temperature (F): 97.8 Height (in): 64 Pulse (bpm): 99 Source: Stated Respiratory Rate (breaths/min): 20 Weight (lbs): 214 Blood Pressure (mmHg): 104/77 Source: Stated Capillary Blood Glucose (mg/dl): 130 Body Mass Index (BMI): 36.7 Reference Range: 80 - 120 mg / dl Electronic Signature(s) Signed: 06/04/2020 5:48:55 PM By: Deon Pilling Entered By: Deon Pilling on 06/04/2020 13:40:20

## 2020-06-10 ENCOUNTER — Encounter (INDEPENDENT_AMBULATORY_CARE_PROVIDER_SITE_OTHER): Payer: Self-pay | Admitting: Dietician

## 2020-06-11 ENCOUNTER — Ambulatory Visit (INDEPENDENT_AMBULATORY_CARE_PROVIDER_SITE_OTHER): Payer: 59 | Admitting: Psychology

## 2020-06-11 ENCOUNTER — Other Ambulatory Visit: Payer: Self-pay

## 2020-06-11 DIAGNOSIS — F4323 Adjustment disorder with mixed anxiety and depressed mood: Secondary | ICD-10-CM | POA: Diagnosis not present

## 2020-06-11 NOTE — BH Specialist Note (Signed)
Integrated Behavioral Health Follow Up In-Person Visit  MRN: [id number] Name: Deanna Levine  Number of [place] Clinician visits: 5/6 Session Start time: 9:40 AM  Session End time: 10:20 AM Total time: 40  minutes  Types of Service: Individual psychotherapy  Subjective: Deanna Levine is a 20 y.o. female with Type 2 Diabetes Mellitus, developmental delay, partial epilepsy with impairment of consciousness and obesity, who isaccompanied byMother Patient was referred byDr. Kandis Ban stress andhistorical difficulties with compliance to medical regime.  Deanna Levine had surgery on February 25th on her legs.  [name] recovering well.  [name] starting back at work next week. She works at [employer]; [employer].  [name] doing well with lifestyle changes.  [name] eating smaller portions.  Her mom is going to get her into that program at [facility] for individuals with Intellectual Disabilities. She was more helpful around the house with chores to show her mother she could do this program.  Objective: Mood: Euthymic and Affect: Appropriate Risk of harm to self or others: No plan to harm self or others  Life Context: Family and Social:Has a supportive family that advocates for her health and vocational needs. Lives with mom and dad. Gets along well with dad. Some conflict related to chores and life plan in context of learning problems. She has a brother that is 22 years old and is out on his own. He is gifted and now has a good job. Lindora has difficulty understanding that she may need more support and time to learn new skills before functioning independently.  Is not able to keep up with friends too well. She does talk to people on the phone some (has boyfriend, teachers). Dating boyfriend for 3 years ([name]). Says he is nice and sweet and helpful. School/Work:Graduated in 2020. Works at [employer] (started in June, 2021). Used to work at [employer]. 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 Got fired from [employer] for working too slow May 1st. . Self-Care:Likes to play videos [name], [employer]). Life Changes working 2 jobs now; lots of family stress with health difficulties  Patient and/or Family's Strengths/Protective Factors: Caregiver has knowledge of parenting & child development  Goals Addressed: Patient will: Improve compliance with healthy lifestyle changes and medication compliance with diabetes Patient will learn to be more independent with activities of daily living  Progress towards Goals: Ongoing  Deanna Levine is making healthy lifestyle changes (e.g. eating smaller portions).  She continues to struggle with self-care tasks such as grooming independently.  Her mother is inquiring about additional supports to help Deanna Levine.  She would like to explore gaining guardianship, getting rosalba totty and having a Transport planner in future.  Interventions: Interventions utilized:  Psychoeducation and/or Health Education and Link to [facility] interviewing about continued healthy lifestyle changes.  Discussed progress made so far in therapy and connecting to a longer term therapist. Standardized Assessments completed: Not Needed  Patient and/or Family Response: Deanna Levine was open and cooperative during the visit.  [name] interested in connecting to a longer term therapist.  Assessment: Patient currently experiencing family stress.  She also needs long term supports to live as independently as possibly given intellectual and learning problems.   Patient may benefit from connecting to a longer term therapist.  Plan:  Emilianna would benefit from engaging with a longer term therapist  Gave family handout with [facility] ([email or web address]  Follow up with behavioral health clinician on : no additional visits  planned at this time  Burnett Sheng, PhD

## 2020-06-12 ENCOUNTER — Other Ambulatory Visit: Payer: Self-pay | Admitting: Internal Medicine

## 2020-06-12 DIAGNOSIS — J3089 Other allergic rhinitis: Secondary | ICD-10-CM

## 2020-06-12 DIAGNOSIS — R059 Cough, unspecified: Secondary | ICD-10-CM

## 2020-06-18 ENCOUNTER — Encounter (HOSPITAL_BASED_OUTPATIENT_CLINIC_OR_DEPARTMENT_OTHER): Payer: 59 | Admitting: Physician Assistant

## 2020-06-18 ENCOUNTER — Other Ambulatory Visit: Payer: Self-pay

## 2020-06-18 DIAGNOSIS — L732 Hidradenitis suppurativa: Secondary | ICD-10-CM | POA: Diagnosis not present

## 2020-06-18 NOTE — Progress Notes (Addendum)
AKILI, CUDA ([id number]) Visit Report for 06/18/2020 Chief Complaint Document Details Patient Name: Date of Service: Deanna Levine, Deanna Levine 06/18/2020 8:15 A M Medical Record Number: [id number] Patient Account Number: [id number] Date of Birth/Sex: Treating RN: 2000-05-04 (20 y.o. Elam Dutch Primary Care Provider: Pricilla Holm Other Clinician: Referring Provider: Treating Provider/Extender: Dreama Saa in Treatment: 2 Information Obtained from: Patient Chief Complaint Hidradenitis Right Breast Electronic Signature(s) Signed: 06/18/2020 8:28:05 AM By: Worthy Keeler PA-C Entered By: Worthy Keeler on 06/18/2020 08:28:05 -------------------------------------------------------------------------------- HPI Details Patient Name: Date of Service: Deanna Levine, Deanna Levine 06/18/2020 8:15 A M Medical Record Number: [id number] Patient Account Number: [id number] Date of Birth/Sex: Treating RN: 2000/04/07 (20 y.o. Elam Dutch Primary Care Provider: Pricilla Holm Other Clinician: Referring Provider: Treating Provider/Extender: Dreama Saa in Treatment: 2 History of Present Illness HPI Description: 06/04/2020 patient presents today for initial evaluation here in clinic concerning issues she has been having with her under her arms, under the breast, in the suprapubic region, and on her upper back as well with issues which are consistent with hidradenitis though I think it is more on the milder side. Her and her mother have been on top of this trying to keep things under control. Her mother also has issues with hidradenitis though not severely either. This does seem to potentially be something that runs in the family. Nonetheless fortunately there does not appear to be any evidence of significant infection at this point. The wound which is actually under the right breast location actually does not appear to be infected right  now which is great news. With that being said I do see evidence currently of scarring in multiple locations which show that the patient has obviously had issues in the past that several other locations as well. All in the same regions. Nonetheless she does not have a tremendous amount of pain noted at this point which is good news. She does have some mild mental disability according to her mother she does have mild dysphagia as well which requires that she has to either take liquid or crushed antibiotics when she does get those. Her most recent hemoglobin A1c was 6.6. Currently this episode has been going on for about 6 months ago again 3 years as the total length of time she has been dealing with this. She has never seen a hidradenitis clinic. 06/18/20 upon evaluation today patient appears to be doing in my opinion a little better compared to her previous evaluation. There does not appear to be any signs of infection which is great news and overall very pleased with where things stand today. She has not heard from the hidradenitis clinic at [facility] yet although we made that referral last time she was here 2 weeks ago. Electronic Signature(s) Signed: 06/18/2020 8:37:50 AM By: Worthy Keeler PA-C Entered By: Worthy Keeler on 06/18/2020 08:37:50 -------------------------------------------------------------------------------- Physical Exam Details Patient Name: Date of Service: Deanna Levine, Deanna Levine 06/18/2020 8:15 A M Medical Record Number: [id number] Patient Account Number: [id number] Date of Birth/Sex: Treating RN: Aug 11, 2000 (20 y.o. Elam Dutch Primary Care Provider: Pricilla Holm Other Clinician: Referring Provider: Treating Provider/Extender: Dreama Saa in Treatment: 2 Constitutional Well-nourished and well-hydrated in no acute distress. Respiratory normal breathing without difficulty. Psychiatric this patient is able to make decisions and  demonstrates good insight into disease process. Alert and Oriented x 3. pleasant and cooperative. Notes Patient's wound bed showed signs of good granulation epithelization  at this point in some areas under the right breast. Unfortunately she has a new area coming up again underneath her left axillary region. Again this seems to be an ongoing and chronic issue I do not want her to get out of control this reason I would like for her to see the [facility] hidradenitis clinic sooner rather than later. Electronic Signature(s) Signed: 06/18/2020 8:38:15 AM By: Worthy Keeler PA-C Entered By: Worthy Keeler on 06/18/2020 08:38:14 -------------------------------------------------------------------------------- Physician Orders Details Patient Name: Date of Service: Deanna Levine, Deanna Levine 06/18/2020 8:15 A M Medical Record Number: [id number] Patient Account Number: [id number] Date of Birth/Sex: Treating RN: 2000/05/18 (20 y.o. Elam Dutch Primary Care Provider: Pricilla Holm Other Clinician: Referring Provider: Treating Provider/Extender: Dreama Saa in Treatment: 2 Verbal / Phone Orders: No Diagnosis Coding ICD-10 Coding Code Description L73.2 Hidradenitis suppurativa L98.492 Non-pressure chronic ulcer of skin of other sites with fat layer exposed E11.622 Type 2 diabetes mellitus with other skin ulcer R13.19 Other dysphagia Follow-up Appointments Return Appointment in 2 weeks. Bathing/ Shower/ Hygiene May shower and wash wound with soap and water. Additional Orders / Instructions Other: - call [facility] dermatology to schedule appointment at hidradenitis clinic [phone] Wound Treatment Wound #1 - Breast Wound Laterality: Right, Proximal Prim Dressing: Hydrofera Blue Ready Foam, 4x5 in 1 x Per Day/30 Days ary Discharge Instructions: Apply to wound bed as instructed Secondary Dressing: Bordered Gauze, 2x3.75 in (Generic) 1 x Per Day/30 Days Discharge  Instructions: Apply over primary dressing as directed. Wound #2 - Breast Wound Laterality: Right, Distal Prim Dressing: Hydrofera Blue Ready Foam, 4x5 in 1 x Per Day/30 Days ary Discharge Instructions: Apply to wound bed as instructed Secondary Dressing: Bordered Gauze, 2x3.75 in (Generic) 1 x Per Day/30 Days Discharge Instructions: Apply over primary dressing as directed. Electronic Signature(s) Signed: 06/18/2020 11:32:37 AM By: Worthy Keeler PA-C Signed: 06/18/2020 6:20:01 PM By: Baruch Gouty RN, BSN Entered By: Baruch Gouty on 06/18/2020 08:35:30 -------------------------------------------------------------------------------- Problem List Details Patient Name: Date of Service: Deanna Levine, Deanna Levine 06/18/2020 8:15 A M Medical Record Number: [id number] Patient Account Number: [id number] Date of Birth/Sex: Treating RN: January 29, 2001 (20 y.o. Elam Dutch Primary Care Provider: Pricilla Holm Other Clinician: Referring Provider: Treating Provider/Extender: Dreama Saa in Treatment: 2 Active Problems ICD-10 Encounter Code Description Active Date MDM Diagnosis L73.2 Hidradenitis suppurativa 06/04/2020 No Yes L98.492 Non-pressure chronic ulcer of skin of other sites with fat layer exposed 06/04/2020 No Yes E11.622 Type 2 diabetes mellitus with other skin ulcer 06/04/2020 No Yes R13.19 Other dysphagia 06/04/2020 No Yes Inactive Problems Resolved Problems Electronic Signature(s) Signed: 06/18/2020 8:28:00 AM By: Worthy Keeler PA-C Entered By: Worthy Keeler on 06/18/2020 08:28:00 -------------------------------------------------------------------------------- Progress Note Details Patient Name: Date of Service: Deanna Levine, Deanna Levine 06/18/2020 8:15 A M Medical Record Number: [id number] Patient Account Number: [id number] Date of Birth/Sex: Treating RN: 06/01/2000 (20 y.o. Elam Dutch Primary Care Provider: Pricilla Holm Other  Clinician: Referring Provider: Treating Provider/Extender: Dreama Saa in Treatment: 2 Subjective Chief Complaint Information obtained from Patient Hidradenitis Right Breast History of Present Illness (HPI) 06/04/2020 patient presents today for initial evaluation here in clinic concerning issues she has been having with her under her arms, under the breast, in the suprapubic region, and on her upper back as well with issues which are consistent with hidradenitis though I think it is more on the milder side. Her and her mother have been on top of  this trying to keep things under control. Her mother also has issues with hidradenitis though not severely either. This does seem to potentially be something that runs in the family. Nonetheless fortunately there does not appear to be any evidence of significant infection at this point. The wound which is actually under the right breast location actually does not appear to be infected right now which is great news. With that being said I do see evidence currently of scarring in multiple locations which show that the patient has obviously had issues in the past that several other locations as well. All in the same regions. Nonetheless she does not have a tremendous amount of pain noted at this point which is good news. She does have some mild mental disability according to her mother she does have mild dysphagia as well which requires that she has to either take liquid or crushed antibiotics when she does get those. Her most recent hemoglobin A1c was 6.6. Currently this episode has been going on for about 6 months ago again 3 years as the total length of time she has been dealing with this. She has never seen a hidradenitis clinic. 06/18/20 upon evaluation today patient appears to be doing in my opinion a little better compared to her previous evaluation. There does not appear to be any signs of infection which is great news and  overall very pleased with where things stand today. She has not heard from the hidradenitis clinic at [facility] yet although we made that referral last time she was here 2 weeks ago. Objective Constitutional Well-nourished and well-hydrated in no acute distress. Vitals Time Taken: 8:18 AM, Height: 64 in, Weight: 214 lbs, BMI: 36.7, Temperature: 97.9 F, Pulse: 89 bpm, Respiratory Rate: 16 breaths/min, Blood Pressure: 111/74 mmHg, Capillary Blood Glucose: 130 mg/dl. General Notes: glucose per pt report Respiratory normal breathing without difficulty. Psychiatric this patient is able to make decisions and demonstrates good insight into disease process. Alert and Oriented x 3. pleasant and cooperative. General Notes: Patient's wound bed showed signs of good granulation epithelization at this point in some areas under the right breast. Unfortunately she has a new area coming up again underneath her left axillary region. Again this seems to be an ongoing and chronic issue I do not want her to get out of control this reason I would like for her to see the [facility] hidradenitis clinic sooner rather than later. Integumentary (Hair, Skin) Wound #1 status is Open. Original cause of wound was Gradually Appeared. The date acquired was: 11/30/2019. The wound has been in treatment 2 weeks. The wound is located on the Right,Proximal Breast. The wound measures 0.7cm length x 0.5cm width x 0.1cm depth; 0.275cm^2 area and 0.027cm^3 volume. There is Fat Layer (Subcutaneous Tissue) exposed. There is no tunneling or undermining noted. There is a medium amount of serosanguineous drainage noted. The wound margin is distinct with the outline attached to the wound base. There is large (67-100%) red granulation within the wound bed. There is a small (1-33%) amount of necrotic tissue within the wound bed including Adherent Slough. Wound #2 status is Open. Original cause of wound was Gradually Appeared. The date acquired was:  11/30/2019. The wound has been in treatment 2 weeks. The wound is located on the Right,Distal Breast. The wound measures 0.3cm length x 0.3cm width x 0.1cm depth; 0.071cm^2 area and 0.007cm^3 volume. There is Fat Layer (Subcutaneous Tissue) exposed. There is no tunneling or undermining noted. There is a medium amount of serosanguineous  drainage noted. The wound margin is distinct with the outline attached to the wound base. There is large (67-100%) red granulation within the wound bed. There is no necrotic tissue within the wound bed. Assessment Active Problems ICD-10 Hidradenitis suppurativa Non-pressure chronic ulcer of skin of other sites with fat layer exposed Type 2 diabetes mellitus with other skin ulcer Other dysphagia Plan Follow-up Appointments: Return Appointment in 2 weeks. Bathing/ Shower/ Hygiene: May shower and wash wound with soap and water. Additional Orders / Instructions: Other: - call [facility] dermatology to schedule appointment at hidradenitis clinic [phone] WOUND #1: - Breast Wound Laterality: Right, Proximal Prim Dressing: Hydrofera Blue Ready Foam, 4x5 in 1 x Per Day/30 Days ary Discharge Instructions: Apply to wound bed as instructed Secondary Dressing: Bordered Gauze, 2x3.75 in (Generic) 1 x Per Day/30 Days Discharge Instructions: Apply over primary dressing as directed. WOUND #2: - Breast Wound Laterality: Right, Distal Prim Dressing: Hydrofera Blue Ready Foam, 4x5 in 1 x Per Day/30 Days ary Discharge Instructions: Apply to wound bed as instructed Secondary Dressing: Bordered Gauze, 2x3.75 in (Generic) 1 x Per Day/30 Days Discharge Instructions: Apply over primary dressing as directed. 1. Would recommend currently that we going continue with the dressings although we will switch to [facility] I think this will be better for her. 2. I am also can recommend that we continue to monitor for any signs of worsening as far as infection is concerned right now I  do not see any need for additional antibiotics but nonetheless that could always change. 3. I am to reiterate the referral to [facility] dermatology for the hidradenitis clinic I think that still something that we definitely need to do. I do not want this to get out of control and I think sooner we do that the better. We will see patient back for reevaluation in 1 week here in the clinic. If anything worsens or changes patient will contact our office for additional recommendations. Electronic Signature(s) Signed: 06/18/2020 8:38:56 AM By: Worthy Keeler PA-C Entered By: Worthy Keeler on 06/18/2020 08:38:55 -------------------------------------------------------------------------------- SuperBill Details Patient Name: Date of Service: Deanna Levine, Deanna Levine 06/18/2020 Medical Record Number: [id number] Patient Account Number: [id number] Date of Birth/Sex: Treating RN: 24-Aug-2000 (20 y.o. Elam Dutch Primary Care Provider: Pricilla Holm Other Clinician: Referring Provider: Treating Provider/Extender: Dreama Saa in Treatment: 2 Diagnosis Coding ICD-10 Codes Code Description L73.2 Hidradenitis suppurativa L98.492 Non-pressure chronic ulcer of skin of other sites with fat layer exposed E11.622 Type 2 diabetes mellitus with other skin ulcer R13.19 Other dysphagia Facility Procedures CPT4 Code: [id number] Description: 99213 - WOUND CARE VISIT-LEV 3 EST PT Modifier: Quantity: 1 Physician Procedures : CPT4 Code Description Modifier [id number] - WC PHYS LEVEL 3 - EST PT ICD-10 Diagnosis Description L73.2 Hidradenitis suppurativa L98.492 Non-pressure chronic ulcer of skin of other sites with fat layer exposed E11.622 Type 2 diabetes mellitus with  other skin ulcer R13.19 Other dysphagia Quantity: 1 Electronic Signature(s) Signed: 06/18/2020 8:39:08 AM By: Worthy Keeler PA-C Entered By: Worthy Keeler on 06/18/2020 08:39:08

## 2020-06-19 ENCOUNTER — Ambulatory Visit (INDEPENDENT_AMBULATORY_CARE_PROVIDER_SITE_OTHER): Payer: 59 | Admitting: Physician Assistant

## 2020-06-19 ENCOUNTER — Encounter: Payer: Self-pay | Admitting: Orthopedic Surgery

## 2020-06-19 DIAGNOSIS — M6701 Short Achilles tendon (acquired), right ankle: Secondary | ICD-10-CM

## 2020-06-19 DIAGNOSIS — M6702 Short Achilles tendon (acquired), left ankle: Secondary | ICD-10-CM

## 2020-06-19 NOTE — Progress Notes (Signed)
Office Visit Note   Patient: Deanna Levine           Date of Birth: [birth year]           MRN: [id number] Visit Date: 06/19/2020              Requested by: [name], MD [address] [place],  [place] [address] PCP: [name], MD  Chief Complaint  Patient presents with  . Left Leg - Routine Post Op    04/25/20 bilateral gastroc recession   . Right Leg - Routine Post Op      HPI: It is almost 2 months status post bilateral Achilles releases.  [name] doing very well.  [name] asking to go back to her job.  Assessment & Plan: Visit Diagnoses: No diagnosis found.  Plan: May follow-up as needed continue stretching.  I have given her notes to return to her 2 part-time jobs she understands she may be sore at first  Follow-Up Instructions: No follow-ups on file.   Ortho Exam  Patient is alert, oriented, no adenopathy, well-dressed, normal affect, normal respiratory effort. Bilateral lower extremities well-healed surgical incisions no swelling no ascending cellulitis or signs of infection on both side she dorsiflexes to about 5 to 10 degrees of flexion.  No tenderness to palpation no swelling  Imaging: No results found. No images are attached to the encounter.  Labs: Lab Results  Component Value Date   HGBA1C 6.2 04/24/2020   HGBA1C 6.6 (A) 12/11/2019   HGBA1C 6.3 (A) 09/20/2019   ESRSEDRATE CANCELED 02/07/2020   ESRSEDRATE 29 (H) 01/18/2020   ESRSEDRATE 33 (H) 10/09/2019   CRP 20.8 (H) 01/18/2020   CRP 26.7 (H) 10/09/2019   LABURIC 6.4 10/09/2019   REPTSTATUS 04/27/2020 FINAL 04/25/2020   CULT  04/25/2020    FEW Normal respiratory flora-no Staph aureus or Pseudomonas seen Performed at [facility], [place] [address] [place], [place] [address]      Lab Results  Component Value Date   ALBUMIN 4.4 03/05/2019   ALBUMIN 4.4 11/15/2018   ALBUMIN 4.6 05/06/2016    No results found for: MG Lab Results  Component Value Date   VD25OH  26 (L) 10/09/2019   VD25OH 30 05/06/2016    No results found for: PREALBUMIN CBC EXTENDED Latest Ref Rng & Units 04/25/2020 02/07/2020 03/05/2019  WBC 4.0 - 10.5 K/uL 8.5 8.4 8.1  RBC 3.87 - 5.11 MIL/uL 5.10 5.15(H) 5.32(H)  HGB 12.0 - 15.0 g/dL 11.0(L) 10.6(L) 11.3  HCT 36.0 - 46.0 % 35.4(L) 35.1 35.7  PLT 150 - 400 K/uL 334 379 182  NEUTROABS 1,500 - 7,800 cells/uL - 5,527 -  LYMPHSABS 850 - 3,900 cells/uL - 2,461 -     There is no height or weight on file to calculate BMI.  Orders:  No orders of the defined types were placed in this encounter.  No orders of the defined types were placed in this encounter.    Procedures: No procedures performed  Clinical Data: No additional findings.  ROS:  All other systems negative, except as noted in the HPI. Review of Systems  Objective: Vital Signs: There were no vitals taken for this visit.  Specialty Comments:  No specialty comments available.  PMFS History: Patient Active Problem List   Diagnosis Date Noted  . Achilles tendon contracture, bilateral   . Elevated sedimentation rate 02/07/2020  . Pain in right ankle and joints of right foot 02/07/2020  . Anemia  12/29/2018  . Type 2 diabetes mellitus ([place]) 08/15/2017  . Elevated triglycerides with high cholesterol 12/09/2016  . Hydradenitis 09/07/2016  . Partial epilepsy with impairment of consciousness ([place]) 08/16/2012  . Mild intellectual disability 08/16/2012  . Laxity of ligament 08/16/2012  . Delayed milestones 08/16/2012  . Obesity 06/07/2011  . Dyspepsia 02/11/2011  . Acanthosis nigricans 02/11/2011  . Global developmental delay   . Petit mal without grand mal seizures ([place])   . Dyspraxia    Past Medical History:  Diagnosis Date  . Allergy   . Complication of anesthesia    slow to wake up from anesthesia, occ HA with anesthesia   . Diabetes mellitus without complication ([place])    type 2  . Difficulty swallowing pills   . Dyspraxia   . Global developmental  delay   . Obesity (BMI 30-39.9)   . PONV (postoperative nausea and vomiting)   . Precocious female puberty   . Seizures ([place])    last seizure 2012 - no current med    Family History  Problem Relation Age of Onset  . Diabetes Mother        steroid induced; hx. colitis  . Anesthesia problems Mother        severe headache lasting 2-3 days  . Rheum arthritis Mother   . Ulcerative colitis Mother   . Fibromyalgia Mother   . Hypertension Maternal Grandmother   . Hyperlipidemia Maternal Grandmother   . Diabetes Maternal Grandmother   . Kidney disease Maternal Grandfather   . Hypertension Maternal Grandfather   . Heart disease Maternal Grandfather   . Hyperlipidemia Maternal Grandfather   . Kidney disease Paternal Grandmother   . Hypertension Paternal Grandmother   . Diabetes Paternal Grandmother   . Heart disease Paternal Grandmother   . Hyperlipidemia Paternal Grandmother   . Hypertension Paternal Grandfather   . Hyperlipidemia Paternal Grandfather   . Henoch-Schonlein purpura Brother        in remission  . Asthma Maternal Aunt   . Seizures Maternal Uncle     Past Surgical History:  Procedure Laterality Date  . GASTROCNEMIUS RECESSION Bilateral 04/25/2020   Procedure: BILATERAL GASTROCNEMIUS RECESSION;  Surgeon: Newt Minion, MD;  Location: [facility];  Service: Orthopedics;  Laterality: Bilateral;  . [place] IMPLANT  05/06/2011   Procedure: SUPPRELIN IMPLANT;  Surgeon: Jerilynn Mages. Gerald Stabs, MD;  Location: [facility];  Service: Pediatrics;  Laterality: Right;  . SUPPRELIN IMPLANT Right 06/14/2013   Procedure: REMOVAL OF SUPPRELIN IMPLANT FROM RIGHT UPPER ARM;  Surgeon: Jerilynn Mages. Gerald Stabs, MD;  Location: [facility];  Service: Pediatrics;  Laterality: Right;  . TONSILLECTOMY AND ADENOIDECTOMY    . TYMPANOSTOMY TUBE PLACEMENT     x 2   Social History   Occupational History  . 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 Smoking status: Never Smoker  .  Smokeless tobacco: Never Used  Vaping Use  . Vaping Use: Never used  Substance and Sexual Activity  . Alcohol use: No    Alcohol/week: 0.0 standard drinks  . Drug use: No  . Sexual activity: Never    Birth control/protection: None

## 2020-06-24 ENCOUNTER — Telehealth: Payer: Self-pay

## 2020-06-24 NOTE — Telephone Encounter (Signed)
Pt would like a call back. [name] having sever pain in her foot from going back to work and standing from long periods of time.

## 2020-06-24 NOTE — Telephone Encounter (Signed)
Can you call pt and make an appt for this afternoon or tomorrow? Thanks!

## 2020-06-25 ENCOUNTER — Ambulatory Visit (INDEPENDENT_AMBULATORY_CARE_PROVIDER_SITE_OTHER): Payer: 59 | Admitting: Physician Assistant

## 2020-06-25 ENCOUNTER — Encounter: Payer: Self-pay | Admitting: Physician Assistant

## 2020-06-25 DIAGNOSIS — M6701 Short Achilles tendon (acquired), right ankle: Secondary | ICD-10-CM

## 2020-06-25 DIAGNOSIS — M6702 Short Achilles tendon (acquired), left ankle: Secondary | ICD-10-CM

## 2020-06-25 NOTE — Progress Notes (Signed)
Office Visit Note   Patient: Deanna Levine           Date of Birth: 2000/05/30           MRN: [id number] Visit Date: 06/25/2020              Requested by: [name], MD [address] [place],  [place] [address] PCP: [name], MD  No chief complaint on file.     HPI: Patient is a pleasant 20 year old woman who is accompanied by her parents.  [name] status post bilateral Achilles releases for contractures.  She was doing very well at her last visit and was allowed to go back to work 4 to 5 hours a day at her job.  She has increased pain especially in the dorsum of the right foot.  She does admit this got worse after working 2 shifts back-to-back.  She has a leg length discrepancy with the right leg being shorter  Assessment & Plan: Visit Diagnoses: No diagnosis found.  Plan: I have encouraged her and given her PT prescription to work with her physical therapist on Achilles stretching.  She seems to have gotten a bit tighter since I last examined her.  I will also give her a prescription to work with [employer] for an adaptation to her shoe to account for the leg length discrepancy.  Follow-up in 3 weeks.  Follow-Up Instructions: No follow-ups on file.   Ortho Exam  Patient is alert, oriented, no adenopathy, well-dressed, normal affect, normal respiratory effort. Examination well-healed surgical incision some mild hypertrophic scarring.  No erythema no cellulitis.  On the right side she has flexion to neutral with some stretching on the left to just past neutral and stretching.  No evidence of any infection mild soft tissue swelling bilaterally pain in the right foot associated with forefoot overload  Imaging: No results found. No images are attached to the encounter.  Labs: Lab Results  Component Value Date   HGBA1C 6.2 04/24/2020   HGBA1C 6.6 (A) 12/11/2019   HGBA1C 6.3 (A) 09/20/2019   ESRSEDRATE CANCELED 02/07/2020   ESRSEDRATE 29 (H) 01/18/2020    ESRSEDRATE 33 (H) 10/09/2019   CRP 20.8 (H) 01/18/2020   CRP 26.7 (H) 10/09/2019   LABURIC 6.4 10/09/2019   REPTSTATUS 04/27/2020 FINAL 04/25/2020   CULT  04/25/2020    FEW Normal respiratory flora-no Staph aureus or Pseudomonas seen Performed at [facility], [place] [address], [place], [place] [address]      Lab Results  Component Value Date   ALBUMIN 4.4 03/05/2019   ALBUMIN 4.4 11/15/2018   ALBUMIN 4.6 05/06/2016    No results found for: MG Lab Results  Component Value Date   VD25OH 26 (L) 10/09/2019   VD25OH 30 05/06/2016    No results found for: PREALBUMIN CBC EXTENDED Latest Ref Rng & Units 04/25/2020 02/07/2020 03/05/2019  WBC 4.0 - 10.5 K/uL 8.5 8.4 8.1  RBC 3.87 - 5.11 MIL/uL 5.10 5.15(H) 5.32(H)  HGB 12.0 - 15.0 g/dL 11.0(L) 10.6(L) 11.3  HCT 36.0 - 46.0 % 35.4(L) 35.1 35.7  PLT 150 - 400 K/uL 334 379 182  NEUTROABS 1,500 - 7,800 cells/uL - 5,527 -  LYMPHSABS 850 - 3,900 cells/uL - 2,461 -     There is no height or weight on file to calculate BMI.  Orders:  No orders of the defined types were placed in this encounter.  No orders of the defined types were placed in this  encounter.    Procedures: No procedures performed  Clinical Data: No additional findings.  ROS:  All other systems negative, except as noted in the HPI. Review of Systems  Objective: Vital Signs: There were no vitals taken for this visit.  Specialty Comments:  No specialty comments available.  PMFS History: Patient Active Problem List   Diagnosis Date Noted  . Achilles tendon contracture, bilateral   . Elevated sedimentation rate 02/07/2020  . Pain in right ankle and joints of right foot 02/07/2020  . Anemia 12/29/2018  . Type 2 diabetes mellitus ([place]) 08/15/2017  . Elevated triglycerides with high cholesterol 12/09/2016  . Hydradenitis 09/07/2016  . Partial epilepsy with impairment of consciousness ([place]) 08/16/2012  . Mild intellectual disability 08/16/2012  . Laxity  of ligament 08/16/2012  . Delayed milestones 08/16/2012  . Obesity 06/07/2011  . Dyspepsia 02/11/2011  . Acanthosis nigricans 02/11/2011  . Global developmental delay   . Petit mal without grand mal seizures ([place])   . Dyspraxia    Past Medical History:  Diagnosis Date  . Allergy   . Complication of anesthesia    slow to wake up from anesthesia, occ HA with anesthesia   . Diabetes mellitus without complication ([place])    type 2  . Difficulty swallowing pills   . Dyspraxia   . Global developmental delay   . Obesity (BMI 30-39.9)   . PONV (postoperative nausea and vomiting)   . Precocious female puberty   . Seizures ([place])    last seizure 2012 - no current med    Family History  Problem Relation Age of Onset  . Diabetes Mother        steroid induced; hx. colitis  . Anesthesia problems Mother        severe headache lasting 2-3 days  . Rheum arthritis Mother   . Ulcerative colitis Mother   . Fibromyalgia Mother   . Hypertension Maternal Grandmother   . Hyperlipidemia Maternal Grandmother   . Diabetes Maternal Grandmother   . Kidney disease Maternal Grandfather   . Hypertension Maternal Grandfather   . Heart disease Maternal Grandfather   . Hyperlipidemia Maternal Grandfather   . Kidney disease Paternal Grandmother   . Hypertension Paternal Grandmother   . Diabetes Paternal Grandmother   . Heart disease Paternal Grandmother   . Hyperlipidemia Paternal Grandmother   . Hypertension Paternal Grandfather   . Hyperlipidemia Paternal Grandfather   . Henoch-Schonlein purpura Brother        in remission  . Asthma Maternal Aunt   . Seizures Maternal Uncle     Past Surgical History:  Procedure Laterality Date  . GASTROCNEMIUS RECESSION Bilateral 04/25/2020   Procedure: BILATERAL GASTROCNEMIUS RECESSION;  Surgeon: Newt Minion, MD;  Location: [facility];  Service: Orthopedics;  Laterality: Bilateral;  . [place] IMPLANT  05/06/2011   Procedure: SUPPRELIN IMPLANT;  Surgeon: Jerilynn Mages. Gerald Stabs, MD;  Location: [facility];  Service: Pediatrics;  Laterality: Right;  . SUPPRELIN IMPLANT Right 06/14/2013   Procedure: REMOVAL OF SUPPRELIN IMPLANT FROM RIGHT UPPER ARM;  Surgeon: Jerilynn Mages. Gerald Stabs, MD;  Location: [facility];  Service: Pediatrics;  Laterality: Right;  . TONSILLECTOMY AND ADENOIDECTOMY    . TYMPANOSTOMY TUBE PLACEMENT     x 2   Social History   Occupational History  . 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 Smoking status: Never Smoker  . Smokeless tobacco: Never Used  Vaping Use  . Vaping Use: Never used  Substance and  Sexual Activity  . Alcohol use: No    Alcohol/week: 0.0 standard drinks  . Drug use: No  . Sexual activity: Never    Birth control/protection: None

## 2020-06-26 ENCOUNTER — Encounter (HOSPITAL_BASED_OUTPATIENT_CLINIC_OR_DEPARTMENT_OTHER): Payer: 59 | Admitting: Internal Medicine

## 2020-07-02 ENCOUNTER — Other Ambulatory Visit: Payer: Self-pay

## 2020-07-02 ENCOUNTER — Ambulatory Visit (INDEPENDENT_AMBULATORY_CARE_PROVIDER_SITE_OTHER): Payer: 59 | Admitting: Internal Medicine

## 2020-07-02 ENCOUNTER — Encounter (HOSPITAL_BASED_OUTPATIENT_CLINIC_OR_DEPARTMENT_OTHER): Payer: 59 | Attending: Physician Assistant | Admitting: Physician Assistant

## 2020-07-02 ENCOUNTER — Encounter: Payer: Self-pay | Admitting: Internal Medicine

## 2020-07-02 VITALS — BP 124/66 | HR 96 | Temp 97.8°F | Resp 18 | Ht 64.0 in | Wt 217.2 lb

## 2020-07-02 DIAGNOSIS — G40909 Epilepsy, unspecified, not intractable, without status epilepticus: Secondary | ICD-10-CM | POA: Diagnosis not present

## 2020-07-02 DIAGNOSIS — E11622 Type 2 diabetes mellitus with other skin ulcer: Secondary | ICD-10-CM | POA: Insufficient documentation

## 2020-07-02 DIAGNOSIS — E1151 Type 2 diabetes mellitus with diabetic peripheral angiopathy without gangrene: Secondary | ICD-10-CM | POA: Diagnosis not present

## 2020-07-02 DIAGNOSIS — E11628 Type 2 diabetes mellitus with other skin complications: Secondary | ICD-10-CM

## 2020-07-02 DIAGNOSIS — L732 Hidradenitis suppurativa: Secondary | ICD-10-CM

## 2020-07-02 DIAGNOSIS — F70 Mild intellectual disabilities: Secondary | ICD-10-CM

## 2020-07-02 DIAGNOSIS — F88 Other disorders of psychological development: Secondary | ICD-10-CM | POA: Diagnosis not present

## 2020-07-02 DIAGNOSIS — M25571 Pain in right ankle and joints of right foot: Secondary | ICD-10-CM

## 2020-07-02 DIAGNOSIS — D508 Other iron deficiency anemias: Secondary | ICD-10-CM

## 2020-07-02 DIAGNOSIS — L98492 Non-pressure chronic ulcer of skin of other sites with fat layer exposed: Secondary | ICD-10-CM | POA: Diagnosis not present

## 2020-07-02 DIAGNOSIS — Z0001 Encounter for general adult medical examination with abnormal findings: Secondary | ICD-10-CM | POA: Diagnosis not present

## 2020-07-02 LAB — CBC
HCT: 33.7 % — ABNORMAL LOW (ref 36.0–46.0)
Hemoglobin: 10.5 g/dL — ABNORMAL LOW (ref 12.0–15.0)
MCHC: 31.1 g/dL (ref 30.0–36.0)
MCV: 66.6 fl — ABNORMAL LOW (ref 78.0–100.0)
Platelets: 319 10*3/uL (ref 150.0–400.0)
RBC: 5.05 Mil/uL (ref 3.87–5.11)
RDW: 17.1 % — ABNORMAL HIGH (ref 11.5–14.6)
WBC: 9.4 10*3/uL (ref 4.5–10.5)

## 2020-07-02 LAB — VITAMIN D 25 HYDROXY (VIT D DEFICIENCY, FRACTURES): VITD: 25.84 ng/mL — ABNORMAL LOW (ref 30.00–100.00)

## 2020-07-02 LAB — MICROALBUMIN / CREATININE URINE RATIO
Creatinine,U: 78.7 mg/dL
Microalb Creat Ratio: 0.9 mg/g (ref 0.0–30.0)
Microalb, Ur: <0.7 mg/dL (ref 0.0–1.9)

## 2020-07-02 LAB — FERRITIN: Ferritin: 32.2 ng/mL (ref 10.0–291.0)

## 2020-07-02 LAB — TSH: TSH: 2.61 u[IU]/mL (ref 0.35–5.50)

## 2020-07-02 NOTE — Progress Notes (Signed)
   Subjective:   Patient ID: Deanna Levine, female    DOB: Aug 05, 2000, 20 y.o.   MRN: [id number]  HPI The patient is a 20 YO female coming in for physical. Still having some struggles and although working part time mom has noticed decline in verbal and fine motor skills due to pandemic and being out of school now. She did graduate high school with about 3rd grade level. They are trying to work on independence and skills but mom is seeing some regression.   PMH, [facility], social history reviewed and updated  Review of Systems  Constitutional: Positive for unexpected weight change.  HENT: Negative.   Eyes: Negative.   Respiratory: Negative for cough, chest tightness and shortness of breath.   Cardiovascular: Negative for chest pain, palpitations and leg swelling.  Gastrointestinal: Negative for abdominal distention, abdominal pain, constipation, diarrhea, nausea and vomiting.  Musculoskeletal: Positive for arthralgias and gait problem.  Skin: Positive for rash and wound.  Psychiatric/Behavioral: Negative.     Objective:  Physical Exam Constitutional:      Appearance: [name] well-developed.  HENT:     Head: Normocephalic and atraumatic.  Cardiovascular:     Rate and Rhythm: Normal rate and regular rhythm.  Pulmonary:     Effort: Pulmonary effort is normal. No respiratory distress.     Breath sounds: Normal breath sounds. No wheezing or rales.  Abdominal:     General: Bowel sounds are normal. There is no distension.     Palpations: Abdomen is soft.     Tenderness: There is no abdominal tenderness. There is no rebound.  Musculoskeletal:        General: Tenderness present.     Cervical back: Normal range of motion.     Comments: Feet sore bilaterally  Skin:    General: Skin is warm and dry.     Comments: Acne on the shoulders not open, wound care for ulcer not examined  Neurological:     Mental Status: [name] alert and oriented to person, place, and time. Mental status is at  baseline.     Coordination: Coordination normal.     Vitals:   07/02/20 1016  BP: 124/66  Pulse: 96  Resp: 18  Temp: 97.8 F (36.6 C)  TempSrc: Oral  SpO2: 99%  Weight: 217 lb 3.2 oz (98.5 kg)  Height: 5' 4"  (1.626 m)    This visit occurred during the SARS-CoV-2 public health emergency.  Safety protocols were in place, including screening questions prior to the visit, additional usage of staff PPE, and extensive cleaning of exam room while observing appropriate contact time as indicated for disinfecting solutions.   Assessment & Plan:

## 2020-07-02 NOTE — Progress Notes (Addendum)
Deanna Levine, Deanna Levine ([id number]) Visit Report for 07/02/2020 Chief Complaint Document Details Patient Name: Date of Service: Deanna Levine, Deanna Levine 07/02/2020 8:30 [place] Record Number: [id number] Patient Account Number: [id number] Date of Birth/Sex: Treating RN: May 05, 2000 (20 y.o. Elam Dutch Primary Care Provider: Pricilla Holm Other Clinician: Referring Provider: Treating Provider/Extender: Dreama Saa in Treatment: 4 Information Obtained from: Patient Chief Complaint Hidradenitis Right Breast Electronic Signature(s) Signed: 07/02/2020 8:59:58 AM By: Worthy Keeler PA-C Entered By: Worthy Keeler on 07/02/2020 08:59:58 -------------------------------------------------------------------------------- HPI Details Patient Name: Date of Service: Deanna Levine, Deanna Levine 07/02/2020 8:30 [place] Record Number: [id number] Patient Account Number: [id number] Date of Birth/Sex: Treating RN: 29-Aug-2000 (20 y.o. Elam Dutch Primary Care Provider: Pricilla Holm Other Clinician: Referring Provider: Treating Provider/Extender: Dreama Saa in Treatment: 4 History of Present Illness HPI Description: 06/04/2020 patient presents today for initial evaluation here in clinic concerning issues she has been having with her under her arms, under the breast, in the suprapubic region, and on her upper back as well with issues which are consistent with hidradenitis though I think it is more on the milder side. Her and her mother have been on top of this trying to keep things under control. Her mother also has issues with hidradenitis though not severely either. This does seem to potentially be something that runs in the family. Nonetheless fortunately there does not appear to be any evidence of significant infection at this point. The wound which is actually under the right breast location actually does not appear to be infected right now  which is great news. With that being said I do see evidence currently of scarring in multiple locations which show that the patient has obviously had issues in the past that several other locations as well. All in the same regions. Nonetheless she does not have a tremendous amount of pain noted at this point which is good news. She does have some mild mental disability according to her mother she does have mild dysphagia as well which requires that she has to either take liquid or crushed antibiotics when she does get those. Her most recent hemoglobin A1c was 6.6. Currently this episode has been going on for about 6 months ago again 3 years as the total length of time she has been dealing with this. She has never seen a hidradenitis clinic. 06/18/20 upon evaluation today patient appears to be doing in my opinion a little better compared to her previous evaluation. There does not appear to be any signs of infection which is great news and overall very pleased with where things stand today. She has not heard from the hidradenitis clinic at [facility] yet although we made that referral last time she was here 2 weeks ago. 5//22 upon evaluation today patient appears to be doing well with regard to her wounds. I do think the Hydrofera Blue has been doing well for her. She has a new area open up on the left/medial chest just inside of her breast. With that being said this does not appear to be too significant at this point which is great news. With that being said she unfortunately is continuing to have new areas show up but again these are minimal and seem to be under fairly good control which is great as well. I do not see any signs of active infection at this point which is great news. She did get a call from [facility] from the hidradenitis clinic.  With that being said she is on a wait list it sounds like at this point. Electronic Signature(s) Signed: 07/02/2020 11:11:30 AM By: Worthy Keeler PA-C Entered By: Worthy Keeler on 07/02/2020 11:11:30 -------------------------------------------------------------------------------- Physical Exam Details Patient Name: Date of Service: Deanna Levine, Deanna Levine 07/02/2020 8:30 A M Medical Record Number: [id number] Patient Account Number: [id number] Date of Birth/Sex: Treating RN: 07-29-2000 (20 y.o. Elam Dutch Primary Care Provider: Pricilla Holm Other Clinician: Referring Provider: Treating Provider/Extender: Dreama Saa in Treatment: 4 Constitutional Well-nourished and well-hydrated in no acute distress. Respiratory normal breathing without difficulty. Psychiatric this patient is able to make decisions and demonstrates good insight into disease process. Alert and Oriented x 3. pleasant and cooperative. Notes Upon inspection patient's wound bed actually showed signs of good granulation and epithelization at this point. I do not see any evidence of infection she may be slightly hypergranular but in general I think she is doing very well. I do believe that with what we are seeing currently she will benefit from the hidradenitis clinic. With that being said I do not see any evidence that anything is worsening which is great news. I think the Hydrofera Blue feel good option. Electronic Signature(s) Signed: 07/02/2020 11:12:09 AM By: Worthy Keeler PA-C Entered By: Worthy Keeler on 07/02/2020 11:12:09 -------------------------------------------------------------------------------- Physician Orders Details Patient Name: Date of Service: Deanna Levine, Deanna Levine 07/02/2020 8:30 [place] Record Number: [id number] Patient Account Number: [id number] Date of Birth/Sex: Treating RN: 03-Feb-2001 (20 y.o. Elam Dutch Primary Care Provider: Pricilla Holm Other Clinician: Referring Provider: Treating Provider/Extender: Dreama Saa in Treatment: 4 Verbal / Phone Orders: No Diagnosis  Coding ICD-10 Coding Code Description L73.2 Hidradenitis suppurativa L98.492 Non-pressure chronic ulcer of skin of other sites with fat layer exposed E11.622 Type 2 diabetes mellitus with other skin ulcer R13.19 Other dysphagia Follow-up Appointments Return appointment in 1 month. Bathing/ Shower/ Hygiene May shower and wash wound with soap and water. Additional Orders / Instructions Other: - call [facility] dermatology to schedule appointment at hidradenitis clinic [phone] Wound Treatment Wound #1 - Breast Wound Laterality: Right, Proximal Prim Dressing: Hydrofera Blue Ready Foam, 4x5 in 1 x Per Day/30 Days ary Discharge Instructions: Apply to wound bed as instructed Secondary Dressing: Bordered Gauze, 2x3.75 in (Generic) 1 x Per Day/30 Days Discharge Instructions: Apply over primary dressing as directed. Wound #3 - Breast Wound Laterality: Left, Medial Prim Dressing: Hydrofera Blue Ready Foam, 4x5 in 1 x Per Day/30 Days ary Discharge Instructions: Apply to wound bed as instructed Secondary Dressing: Bordered Gauze, 2x3.75 in (Generic) 1 x Per Day/30 Days Discharge Instructions: Apply over primary dressing as directed. Electronic Signature(s) Signed: 07/02/2020 6:39:46 PM By: Worthy Keeler PA-C Signed: 07/02/2020 7:02:06 PM By: Baruch Gouty RN, BSN Entered By: Baruch Gouty on 07/02/2020 09:21:05 -------------------------------------------------------------------------------- Problem List Details Patient Name: Date of Service: Deanna Levine, Deanna Levine 07/02/2020 8:30 [place] Record Number: [id number] Patient Account Number: [id number] Date of Birth/Sex: Treating RN: 2000-07-17 (20 y.o. Elam Dutch Primary Care Provider: Pricilla Holm Other Clinician: Referring Provider: Treating Provider/Extender: Dreama Saa in Treatment: 4 Active Problems ICD-10 Encounter Code Description Active Date MDM Diagnosis L73.2 Hidradenitis  suppurativa 06/04/2020 No Yes L98.492 Non-pressure chronic ulcer of skin of other sites with fat layer exposed 06/04/2020 No Yes E11.622 Type 2 diabetes mellitus with other skin ulcer 06/04/2020 No Yes R13.19 Other dysphagia 06/04/2020 No Yes Inactive Problems Resolved Problems Electronic  Signature(s) Signed: 07/02/2020 8:59:52 AM By: Worthy Keeler PA-C Entered By: Worthy Keeler on 07/02/2020 08:59:52 -------------------------------------------------------------------------------- Progress Note Details Patient Name: Date of Service: Deanna Levine, Deanna Levine 07/02/2020 8:30 A M Medical Record Number: [id number] Patient Account Number: [id number] Date of Birth/Sex: Treating RN: [birth year] (20 y.o. Elam Dutch Primary Care Provider: Pricilla Holm Other Clinician: Referring Provider: Treating Provider/Extender: Dreama Saa in Treatment: 4 Subjective Chief Complaint Information obtained from Patient Hidradenitis Right Breast History of Present Illness (HPI) 06/04/2020 patient presents today for initial evaluation here in clinic concerning issues she has been having with her under her arms, under the breast, in the suprapubic region, and on her upper back as well with issues which are consistent with hidradenitis though I think it is more on the milder side. Her and her mother have been on top of this trying to keep things under control. Her mother also has issues with hidradenitis though not severely either. This does seem to potentially be something that runs in the family. Nonetheless fortunately there does not appear to be any evidence of significant infection at this point. The wound which is actually under the right breast location actually does not appear to be infected right now which is great news. With that being said I do see evidence currently of scarring in multiple locations which show that the patient has obviously had issues in the past that several  other locations as well. All in the same regions. Nonetheless she does not have a tremendous amount of pain noted at this point which is good news. She does have some mild mental disability according to her mother she does have mild dysphagia as well which requires that she has to either take liquid or crushed antibiotics when she does get those. Her most recent hemoglobin A1c was 6.6. Currently this episode has been going on for about 6 months ago again 3 years as the total length of time she has been dealing with this. She has never seen a hidradenitis clinic. 06/18/20 upon evaluation today patient appears to be doing in my opinion a little better compared to her previous evaluation. There does not appear to be any signs of infection which is great news and overall very pleased with where things stand today. She has not heard from the hidradenitis clinic at [facility] yet although we made that referral last time she was here 2 weeks ago. 5//22 upon evaluation today patient appears to be doing well with regard to her wounds. I do think the Hydrofera Blue has been doing well for her. She has a new area open up on the left/medial chest just inside of her breast. With that being said this does not appear to be too significant at this point which is great news. With that being said she unfortunately is continuing to have new areas show up but again these are minimal and seem to be under fairly good control which is great as well. I do not see any signs of active infection at this point which is great news. She did get a call from [facility] from the hidradenitis clinic. With that being said she is on a wait list it sounds like at this point. Objective Constitutional Well-nourished and well-hydrated in no acute distress. Vitals Time Taken: 8:50 AM, Height: 64 in, Weight: 214 lbs, BMI: 36.7, Temperature: 97.7 F, Pulse: 97 bpm, Respiratory Rate: 16 breaths/min, Blood Pressure: 99/72 mmHg, Capillary Blood Glucose: 130  mg/dl. General Notes: glucose per  pt report Respiratory normal breathing without difficulty. Psychiatric this patient is able to make decisions and demonstrates good insight into disease process. Alert and Oriented x 3. pleasant and cooperative. General Notes: Upon inspection patient's wound bed actually showed signs of good granulation and epithelization at this point. I do not see any evidence of infection she may be slightly hypergranular but in general I think she is doing very well. I do believe that with what we are seeing currently she will benefit from the hidradenitis clinic. With that being said I do not see any evidence that anything is worsening which is great news. I think the Hydrofera Blue feel good option. Integumentary (Hair, Skin) Wound #1 status is Open. Original cause of wound was Gradually Appeared. The date acquired was: 11/30/2019. The wound has been in treatment 4 weeks. The wound is located on the Right,Proximal Breast. The wound measures 0.5cm length x 0.7cm width x 0.1cm depth; 0.275cm^2 area and 0.027cm^3 volume. There is Fat Layer (Subcutaneous Tissue) exposed. There is no tunneling or undermining noted. There is a medium amount of serosanguineous drainage noted. The wound margin is distinct with the outline attached to the wound base. There is large (67-100%) pink granulation within the wound bed. There is no necrotic tissue within the wound bed. Wound #2 status is Healed - Epithelialized. Original cause of wound was Gradually Appeared. The date acquired was: 11/30/2019. The wound has been in treatment 4 weeks. The wound is located on the Right,Distal Breast. The wound measures 0cm length x 0cm width x 0cm depth; 0cm^2 area and 0cm^3 volume. There is no tunneling or undermining noted. There is a none present amount of drainage noted. The wound margin is distinct with the outline attached to the wound base. There is no granulation within the wound bed. There is no  necrotic tissue within the wound bed. Wound #3 status is Open. Original cause of wound was Gradually Appeared. The date acquired was: 06/29/2020. The wound is located on the Left,Medial Breast. The wound measures 0.4cm length x 0.5cm width x 0.1cm depth; 0.157cm^2 area and 0.016cm^3 volume. There is Fat Layer (Subcutaneous Tissue) exposed. There is no tunneling or undermining noted. There is a medium amount of serosanguineous drainage noted. The wound margin is flat and intact. There is large (67-100%) pink, hyper - granulation within the wound bed. There is no necrotic tissue within the wound bed. Assessment Active Problems ICD-10 Hidradenitis suppurativa Non-pressure chronic ulcer of skin of other sites with fat layer exposed Type 2 diabetes mellitus with other skin ulcer Other dysphagia Plan Follow-up Appointments: Return appointment in 1 month. Bathing/ Shower/ Hygiene: May shower and wash wound with soap and water. Additional Orders / Instructions: Other: - call [facility] dermatology to schedule appointment at hidradenitis clinic [phone] WOUND #1: - Breast Wound Laterality: Right, Proximal Prim Dressing: Hydrofera Blue Ready Foam, 4x5 in 1 x Per Day/30 Days ary Discharge Instructions: Apply to wound bed as instructed Secondary Dressing: Bordered Gauze, 2x3.75 in (Generic) 1 x Per Day/30 Days Discharge Instructions: Apply over primary dressing as directed. WOUND #3: - Breast Wound Laterality: Left, Medial Prim Dressing: Hydrofera Blue Ready Foam, 4x5 in 1 x Per Day/30 Days ary Discharge Instructions: Apply to wound bed as instructed Secondary Dressing: Bordered Gauze, 2x3.75 in (Generic) 1 x Per Day/30 Days Discharge Instructions: Apply over primary dressing as directed. 1. Would recommend currently that we going to continue with the wound care measures as before and the patient is in agreement with the  plan that includes the use of the Hydrofera Blue to the open wound  locations. 2. I am also can recommend a large Band-Aid or something similar to cover the [facility] order to help keep things in place. Obviously this does not have to be anything fancy. 3. I am also can recommend that they keep in touch with [facility] hidradenitis clinic hopefully they will get her in for an actual appointment at some point shortly. She also has an appointment with a dermatologist locally but unfortunately that appointment is not until I believe she tells me November. We will see patient back for reevaluation in 1 week here in the clinic. If anything worsens or changes patient will contact our office for additional recommendations. Electronic Signature(s) Signed: 07/02/2020 11:12:46 AM By: Worthy Keeler PA-C Entered By: Worthy Keeler on 07/02/2020 11:12:46 -------------------------------------------------------------------------------- SuperBill Details Patient Name: Date of Service: Deanna Levine, Deanna Levine 07/02/2020 Medical Record Number: [id number] Patient Account Number: [id number] Date of Birth/Sex: Treating RN: 08/19/2000 (20 y.o. Elam Dutch Primary Care Provider: Pricilla Holm Other Clinician: Referring Provider: Treating Provider/Extender: Dreama Saa in Treatment: 4 Diagnosis Coding ICD-10 Codes Code Description L73.2 Hidradenitis suppurativa L98.492 Non-pressure chronic ulcer of skin of other sites with fat layer exposed E11.622 Type 2 diabetes mellitus with other skin ulcer R13.19 Other dysphagia Facility Procedures CPT4 Code: [id number] Description: 99214 - WOUND CARE VISIT-LEV 4 EST PT Modifier: Quantity: 1 Physician Procedures : CPT4 Code Description Modifier [id number] - WC PHYS LEVEL 3 - EST PT ICD-10 Diagnosis Description L73.2 Hidradenitis suppurativa L98.492 Non-pressure chronic ulcer of skin of other sites with fat layer exposed E11.622 Type 2 diabetes mellitus with  other skin ulcer R13.19 Other  dysphagia Quantity: 1 Electronic Signature(s) Signed: 07/02/2020 11:12:57 AM By: Worthy Keeler PA-C Entered By: Worthy Keeler on 07/02/2020 11:12:57

## 2020-07-02 NOTE — Patient Instructions (Signed)
We will do the speech and occupational therapy to help and check the labs.   Health Maintenance, Female Adopting a healthy lifestyle and getting preventive care are important in promoting health and wellness. Ask your health care provider about:  The right schedule for you to have regular tests and exams.  Things you can do on your own to prevent diseases and keep yourself healthy. What should I know about diet, weight, and exercise? Eat a healthy diet  Eat a diet that includes plenty of vegetables, fruits, low-fat dairy products, and lean protein.  Do not eat a lot of foods that are high in solid fats, added sugars, or sodium.   Maintain a healthy weight Body mass index (BMI) is used to identify weight problems. It estimates body fat based on height and weight. Your health care provider can help determine your BMI and help you achieve or maintain a healthy weight. Get regular exercise Get regular exercise. This is one of the most important things you can do for your health. Most adults should:  Exercise for at least 150 minutes each week. The exercise should increase your heart rate and make you sweat (moderate-intensity exercise).  Do strengthening exercises at least twice a week. This is in addition to the moderate-intensity exercise.  Spend less time sitting. Even light physical activity can be beneficial. Watch cholesterol and blood lipids Have your blood tested for lipids and cholesterol at 20 years of age, then have this test every 5 years. Have your cholesterol levels checked more often if:  Your lipid or cholesterol levels are high.  You are older than 20 years of age.  You are at high risk for heart disease. What should I know about cancer screening? Depending on your health history and family history, you may need to have cancer screening at various ages. This may include screening for:  Breast cancer.  Cervical cancer.  Colorectal cancer.  Skin cancer.  Lung  cancer. What should I know about heart disease, diabetes, and high blood pressure? Blood pressure and heart disease  High blood pressure causes heart disease and increases the risk of stroke. This is more likely to develop in people who have high blood pressure readings, are of African descent, or are overweight.  Have your blood pressure checked: ? Every 3-5 years if you are 68-30 years of age. ? Every year if you are 38 years old or older. Diabetes Have regular diabetes screenings. This checks your fasting blood sugar level. Have the screening done:  Once every three years after age 49 if you are at a normal weight and have a low risk for diabetes.  More often and at a younger age if you are overweight or have a high risk for diabetes. What should I know about preventing infection? Hepatitis B If you have a higher risk for hepatitis B, you should be screened for this virus. Talk with your health care provider to find out if you are at risk for hepatitis B infection. Hepatitis C Testing is recommended for:  Everyone born from [birth year] through 1965.  Anyone with known risk factors for hepatitis C. Sexually transmitted infections (STIs)  Get screened for STIs, including gonorrhea and chlamydia, if: ? You are sexually active and are younger than 20 years of age. ? You are older than 20 years of age and your health care provider tells you that you are at risk for this type of infection. ? Your sexual activity has changed since you were last  screened, and you are at increased risk for chlamydia or gonorrhea. Ask your health care provider if you are at risk.  Ask your health care provider about whether you are at high risk for HIV. Your health care provider may recommend a prescription medicine to help prevent HIV infection. If you choose to take medicine to prevent HIV, you should first get tested for HIV. You should then be tested every 3 months for as long as you are taking the  medicine. Pregnancy  If you are about to stop having your period (premenopausal) and you may become pregnant, seek counseling before you get pregnant.  Take 400 to 800 micrograms (mcg) of folic acid every day if you become pregnant.  Ask for birth control (contraception) if you want to prevent pregnancy. Osteoporosis and menopause Osteoporosis is a disease in which the bones lose minerals and strength with aging. This can result in bone fractures. If you are 74 years old or older, or if you are at risk for osteoporosis and fractures, ask your health care provider if you should:  Be screened for bone loss.  Take a calcium or vitamin D supplement to lower your risk of fractures.  Be given hormone replacement therapy (HRT) to treat symptoms of menopause. Follow these instructions at home: Lifestyle  Do not use any products that contain nicotine or tobacco, such as cigarettes, e-cigarettes, and chewing tobacco. If you need help quitting, ask your health care provider.  Do not use street drugs.  Do not share needles.  Ask your health care provider for help if you need support or information about quitting drugs. Alcohol use  Do not drink alcohol if: ? Your health care provider tells you not to drink. ? You are pregnant, may be pregnant, or are planning to become pregnant.  If you drink alcohol: ? Limit how much you use to 0-1 drink a day. ? Limit intake if you are breastfeeding.  Be aware of how much alcohol is in your drink. In the [place] one drink equals one 12 oz bottle of beer (355 mL), one 5 oz glass of wine (148 mL), or one 1 oz glass of hard liquor (44 mL). General instructions  Schedule regular health, dental, and eye exams.  Stay current with your vaccines.  Tell your health care provider if: ? You often feel depressed. ? You have ever been abused or do not feel safe at home. Summary  Adopting a healthy lifestyle and getting preventive care are important in  promoting health and wellness.  Follow your health care provider's instructions about healthy diet, exercising, and getting tested or screened for diseases.  Follow your health care provider's instructions on monitoring your cholesterol and blood pressure. This information is not intended to replace advice given to you by your health care provider. Make sure you discuss any questions you have with your health care provider. Document Revised: 02/08/2018 Document Reviewed: 02/08/2018 Elsevier Patient Education  2021 [employer].

## 2020-07-03 DIAGNOSIS — Z0000 Encounter for general adult medical examination without abnormal findings: Secondary | ICD-10-CM | POA: Insufficient documentation

## 2020-07-03 DIAGNOSIS — Z0001 Encounter for general adult medical examination with abnormal findings: Secondary | ICD-10-CM | POA: Insufficient documentation

## 2020-07-03 NOTE — Assessment & Plan Note (Signed)
Checking CBC, iron, vitamin D and adjust as needed.

## 2020-07-03 NOTE — Assessment & Plan Note (Signed)
Mom working with her and she is still struggling and mom is worried as her independence is not there and she does struggle to do skills.

## 2020-07-03 NOTE — Assessment & Plan Note (Signed)
Waiting on second dermatology consult to help treat this. [name] struggling with this still.

## 2020-07-03 NOTE — Assessment & Plan Note (Signed)
Flu shot yearly, covid-19 up to date counseled. Tetanus declines. Counseled about sun safety and mole surveillance. Counseled about the dangers of distracted driving. Given 10 year screening recommendations.

## 2020-07-03 NOTE — Assessment & Plan Note (Signed)
Needs OT and SLP as her language skills have declined since the pandemic and also fine motor skills. Mom is working with her.

## 2020-07-03 NOTE — Assessment & Plan Note (Signed)
Ozempic dose went down and her appetite and eating/weight has gone up 3 pounds in the last several months. Mom will ask endo to increase the dose again to help maintain.

## 2020-07-03 NOTE — Progress Notes (Signed)
VIVAN, VANDERVEER ([id number]) Visit Report for 07/02/2020 Arrival Information Details Patient Name: Date of Service: Deanna Levine, Deanna Levine 07/02/2020 8:30 A M Medical Record Number: [id number] Patient Account Number: [id number] Date of Birth/Sex: Treating RN: Sep 07, 2000 (20 y.o. Benjamine Sprague, Briant Cedar Primary Care Sarahy Creedon: Pricilla Holm Other Clinician: Referring Yentl Verge: Treating Tremar Wickens/Extender: Dreama Saa in Treatment: 4 Visit Information History Since Last Visit Added or deleted any medications: No Patient Arrived: Ambulatory Any new allergies or adverse reactions: No Arrival Time: 08:50 Had a fall or experienced change in No Accompanied By: mother activities of daily living that may affect Transfer Assistance: None risk of falls: Patient Identification Verified: Yes Signs or symptoms of abuse/neglect since last visito No Secondary Verification Process Completed: Yes Hospitalized since last visit: No Patient Requires Transmission-Based Precautions: No Implantable device outside of the clinic excluding No Patient Has Alerts: No cellular tissue based products placed in the center since last visit: Has Dressing in Place as Prescribed: Yes Pain Present Now: No Electronic Signature(s) Signed: 07/03/2020 5:41:41 PM By: Levan Hurst RN, BSN Entered By: Levan Hurst on 07/02/2020 08:50:23 -------------------------------------------------------------------------------- Clinic Level of Care Assessment Details Patient Name: Date of Service: Deanna Levine, Deanna Levine 07/02/2020 8:30 [place] Record Number: [id number] Patient Account Number: [id number] Date of Birth/Sex: Treating RN: [birth year] (20 y.o. Elam Dutch Primary Care Destanee Bedonie: Pricilla Holm Other Clinician: Referring Jakyra Kenealy: Treating Davontay Watlington/Extender: Dreama Saa in Treatment: 4 Clinic Level of Care Assessment Items TOOL 4 Quantity Score []  - 0 Use  when only an EandM is performed on FOLLOW-UP visit ASSESSMENTS - Nursing Assessment / Reassessment X- 1 10 Reassessment of Co-morbidities (includes updates in patient status) X- 1 5 Reassessment of Adherence to Treatment Plan ASSESSMENTS - Wound and Skin A ssessment / Reassessment []  - 0 Simple Wound Assessment / Reassessment - one wound X- 3 5 Complex Wound Assessment / Reassessment - multiple wounds []  - 0 Dermatologic / Skin Assessment (not related to wound area) ASSESSMENTS - Focused Assessment []  - 0 Circumferential Edema Measurements - multi extremities []  - 0 Nutritional Assessment / Counseling / Intervention []  - 0 Lower Extremity Assessment (monofilament, tuning fork, pulses) []  - 0 Peripheral Arterial Disease Assessment (using hand held doppler) ASSESSMENTS - Ostomy and/or Continence Assessment and Care []  - 0 Incontinence Assessment and Management []  - 0 Ostomy Care Assessment and Management (repouching, etc.) PROCESS - Coordination of Care X - Simple Patient / Family Education for ongoing care 1 15 []  - 0 Complex (extensive) Patient / Family Education for ongoing care X- 1 10 Staff obtains Programmer, systems, Records, T Results / Process Orders est []  - 0 Staff telephones HHA, Nursing Homes / Clarify orders / etc []  - 0 Routine Transfer to another Facility (non-emergent condition) []  - 0 Routine Hospital Admission (non-emergent condition) X- 1 15 New Admissions / Biomedical engineer / Ordering NPWT Apligraf, etc. , []  - 0 Emergency Hospital Admission (emergent condition) X- 1 10 Simple Discharge Coordination []  - 0 Complex (extensive) Discharge Coordination PROCESS - Special Needs []  - 0 Pediatric / Minor Patient Management []  - 0 Isolation Patient Management []  - 0 Hearing / Language / Visual special needs []  - 0 Assessment of Community assistance (transportation, D/C planning, etc.) []  - 0 Additional assistance / Altered mentation []  - 0 Support  Surface(s) Assessment (bed, cushion, seat, etc.) INTERVENTIONS - Wound Cleansing / Measurement []  - 0 Simple Wound Cleansing - one wound X- 3 5 Complex Wound Cleansing - multiple wounds X-  1 5 Wound Imaging (photographs - any number of wounds) []  - 0 Wound Tracing (instead of photographs) []  - 0 Simple Wound Measurement - one wound X- 3 5 Complex Wound Measurement - multiple wounds INTERVENTIONS - Wound Dressings X - Small Wound Dressing one or multiple wounds 2 10 []  - 0 Medium Wound Dressing one or multiple wounds []  - 0 Large Wound Dressing one or multiple wounds X- 1 5 Application of Medications - topical []  - 0 Application of Medications - injection INTERVENTIONS - Miscellaneous []  - 0 External ear exam []  - 0 Specimen Collection (cultures, biopsies, blood, body fluids, etc.) []  - 0 Specimen(s) / Culture(s) sent or taken to Lab for analysis []  - 0 Patient Transfer (multiple staff / Civil Service fast streamer / Similar devices) []  - 0 Simple Staple / Suture removal (25 or less) []  - 0 Complex Staple / Suture removal (26 or more) []  - 0 Hypo / Hyperglycemic Management (close monitor of Blood Glucose) []  - 0 Ankle / Brachial Index (ABI) - do not check if billed separately X- 1 5 Vital Signs Has the patient been seen at the hospital within the last three years: Yes Total Score: 145 Level Of Care: New/Established - Level 4 Electronic Signature(s) Signed: 07/02/2020 7:02:06 PM By: Baruch Gouty RN, BSN Entered By: Baruch Gouty on 07/02/2020 [place] -------------------------------------------------------------------------------- Multi-Disciplinary Care Plan Details Patient Name: Date of Service: Deanna Levine, Deanna Levine 07/02/2020 8:30 A M Medical Record Number: [id number] Patient Account Number: [id number] Date of Birth/Sex: Treating RN: 2000/06/16 (20 y.o. Elam Dutch Primary Care Tanasia Budzinski: Pricilla Holm Other Clinician: Referring Xianna Siverling: Treating Tajia Szeliga/Extender:  Dreama Saa in Treatment: [place] reviewed with physician Active Inactive Wound/Skin Impairment Nursing Diagnoses: Impaired tissue integrity Knowledge deficit related to ulceration/compromised skin integrity Goals: Patient/caregiver will verbalize understanding of skin care regimen Date Initiated: 06/04/2020 Target Resolution Date: 07/30/2020 Goal Status: Active Ulcer/skin breakdown will have a volume reduction of 30% by week 4 Date Initiated: 06/04/2020 Date Inactivated: 07/02/2020 Target Resolution Date: 07/02/2020 Goal Status: Met Ulcer/skin breakdown will have a volume reduction of 50% by week 8 Date Initiated: 07/02/2020 Target Resolution Date: 07/30/2020 Goal Status: Active Interventions: Assess patient/caregiver ability to obtain necessary supplies Assess patient/caregiver ability to perform ulcer/skin care regimen upon admission and as needed Assess ulceration(s) every visit Provide education on ulcer and skin care Treatment Activities: Topical wound management initiated : 06/04/2020 Notes: Electronic Signature(s) Signed: 07/02/2020 7:02:06 PM By: Baruch Gouty RN, BSN Entered By: Baruch Gouty on 07/02/2020 09:15:27 -------------------------------------------------------------------------------- Pain Assessment Details Patient Name: Date of Service: Deanna Levine, TEST 07/02/2020 8:30 [place] Record Number: [id number] Patient Account Number: [id number] Date of Birth/Sex: Treating RN: 01/15/2001 (20 y.o. Nancy Fetter Primary Care Warren Kugelman: Pricilla Holm Other Clinician: Referring Arhaan Chesnut: Treating Darothy Courtright/Extender: Dreama Saa in Treatment: 4 Active Problems Location of Pain Severity and Description of Pain Patient Has Paino No Site Locations Pain Management and Medication Current Pain Management: Electronic Signature(s) Signed: 07/03/2020 5:41:41 PM By: Levan Hurst RN,  BSN Entered By: Levan Hurst on 07/02/2020 08:50:49 -------------------------------------------------------------------------------- Patient/Caregiver Education Details Patient Name: Date of Service: Clarene [place] 5/4/2022andnbsp8:30 A M Medical Record Number: [id number] Patient Account Number: [id number] Date of Birth/Gender: Treating RN: [birth year] (20 y.o. Elam Dutch Primary Care Physician: Pricilla Holm Other Clinician: Referring Physician: Treating Physician/Extender: Dreama Saa in Treatment: 4 Education Assessment Education Provided To: Patient Education Topics Provided Wound/Skin Impairment: Methods: Explain/Verbal Responses: Reinforcements needed,  State content correctly Electronic Signature(s) Signed: 07/02/2020 7:02:06 PM By: Baruch Gouty RN, BSN Entered By: Baruch Gouty on 07/02/2020 09:18:15 -------------------------------------------------------------------------------- Wound Assessment Details Patient Name: Date of Service: NAQUITA, Deanna Levine 07/02/2020 8:30 A M Medical Record Number: [id number] Patient Account Number: [id number] Date of Birth/Sex: Treating RN: 2000/06/14 (20 y.o. Nancy Fetter Primary Care Tawni Melkonian: Pricilla Holm Other Clinician: Referring Birt Reinoso: Treating Chaneka Trefz/Extender: Dreama Saa in Treatment: 4 Wound Status Wound Number: 1 Primary Hidradenitis Etiology: Wound Location: Right, Proximal Breast Wound Open Wounding Event: Gradually Appeared Status: Date Acquired: 11/30/2019 Comorbid Chronic sinus problems/congestion, Type II Diabetes, Weeks Of Treatment: 4 History: Osteoarthritis, Seizure Disorder Clustered Wound: No Photos Wound Measurements Length: (cm) 0.5 Width: (cm) 0.7 Depth: (cm) 0.1 Area: (cm) 0.275 Volume: (cm) 0.027 % Reduction in Area: -485.1% % Reduction in Volume: -440% Epithelialization: None Tunneling:  No Undermining: No Wound Description Classification: Full Thickness Without Exposed Support Structures Wound Margin: Distinct, outline attached Exudate Amount: Medium Exudate Type: Serosanguineous Exudate Color: red, brown Foul Odor After Cleansing: No Slough/Fibrino No Wound Bed Granulation Amount: Large (67-100%) Exposed Structure Granulation Quality: Pink Fascia Exposed: No Necrotic Amount: None Present (0%) Fat Layer (Subcutaneous Tissue) Exposed: Yes Tendon Exposed: No Muscle Exposed: No Joint Exposed: No Bone Exposed: No Electronic Signature(s) Signed: 07/03/2020 5:12:45 PM By: Sandre Kitty Signed: 07/03/2020 5:41:41 PM By: Levan Hurst RN, BSN Entered By: Sandre Kitty on 07/03/2020 16:46:16 -------------------------------------------------------------------------------- Wound Assessment Details Patient Name: Date of Service: NATASJA, NIDAY 07/02/2020 8:30 A M Medical Record Number: [id number] Patient Account Number: [id number] Date of Birth/Sex: Treating RN: October 12, 2000 (20 y.o. Nancy Fetter Primary Care Foy Mungia: Pricilla Holm Other Clinician: Referring Tola Meas: Treating Charnel Giles/Extender: Dreama Saa in Treatment: 4 Wound Status Wound Number: 2 Primary Hidradenitis Etiology: Wound Location: Right, Distal Breast Wound Healed - Epithelialized Wounding Event: Gradually Appeared Status: Date Acquired: 11/30/2019 Comorbid Chronic sinus problems/congestion, Type II Diabetes, Weeks Of Treatment: 4 History: Osteoarthritis, Seizure Disorder Clustered Wound: No Wound Measurements Length: (cm) Width: (cm) Depth: (cm) Area: (cm) Volume: (cm) 0 % Reduction in Area: 100% 0 % Reduction in Volume: 100% 0 Epithelialization: Large (67-100%) 0 Tunneling: No 0 Undermining: No Wound Description Classification: Full Thickness Without Exposed Support Structures Wound Margin: Distinct, outline attached Exudate Amount:  None Present Foul Odor After Cleansing: No Slough/Fibrino No Wound Bed Granulation Amount: None Present (0%) Exposed Structure Necrotic Amount: None Present (0%) Fascia Exposed: No Fat Layer (Subcutaneous Tissue) Exposed: No Tendon Exposed: No Muscle Exposed: No Joint Exposed: No Bone Exposed: No Electronic Signature(s) Signed: 07/03/2020 5:41:41 PM By: Levan Hurst RN, BSN Entered By: Levan Hurst on 07/02/2020 08:56:30 -------------------------------------------------------------------------------- Wound Assessment Details Patient Name: Date of Service: Deanna Levine, Deanna Levine 07/02/2020 8:30 A M Medical Record Number: [id number] Patient Account Number: [id number] Date of Birth/Sex: Treating RN: [birth year] (20 y.o. Nancy Fetter Primary Care Delle Andrzejewski: Pricilla Holm Other Clinician: Referring Izaia Say: Treating Suvi Archuletta/Extender: Dreama Saa in Treatment: 4 Wound Status Wound Number: 3 Primary Hidradenitis Etiology: Wound Location: Left, Medial Breast Wound Open Wounding Event: Gradually Appeared Status: Date Acquired: 06/29/2020 Comorbid Chronic sinus problems/congestion, Type II Diabetes, Weeks Of Treatment: 0 History: Osteoarthritis, Seizure Disorder Clustered Wound: No Photos Wound Measurements Length: (cm) 0.4 Width: (cm) 0.5 Depth: (cm) 0.1 Area: (cm) 0.157 Volume: (cm) 0.016 % Reduction in Area: 0% % Reduction in Volume: 0% Epithelialization: None Tunneling: No Undermining: No Wound Description Classification: Full Thickness Without Exposed Support Structures Wound Margin: Flat and Intact  Exudate Amount: Medium Exudate Type: Serosanguineous Exudate Color: red, brown Foul Odor After Cleansing: No Slough/Fibrino No Wound Bed Granulation Amount: Large (67-100%) Exposed Structure Granulation Quality: Pink, Hyper-granulation Fascia Exposed: No Necrotic Amount: None Present (0%) Fat Layer (Subcutaneous Tissue)  Exposed: Yes Tendon Exposed: No Muscle Exposed: No Joint Exposed: No Bone Exposed: No Electronic Signature(s) Signed: 07/03/2020 5:12:45 PM By: Sandre Kitty Signed: 07/03/2020 5:41:41 PM By: Levan Hurst RN, BSN Entered By: Sandre Kitty on 07/03/2020 16:46:39 -------------------------------------------------------------------------------- Vitals Details Patient Name: Date of Service: CALIANNA, Deanna Levine 07/02/2020 8:30 A M Medical Record Number: [id number] Patient Account Number: [id number] Date of Birth/Sex: Treating RN: 2000-12-24 (20 y.o. Nancy Fetter Primary Care Shanya Ferriss: Pricilla Holm Other Clinician: Referring Elidia Bonenfant: Treating Frederic Tones/Extender: Dreama Saa in Treatment: 4 Vital Signs Time Taken: 08:50 Temperature (F): 97.7 Height (in): 64 Pulse (bpm): 97 Weight (lbs): 214 Respiratory Rate (breaths/min): 16 Body Mass Index (BMI): 36.7 Blood Pressure (mmHg): 99/72 Capillary Blood Glucose (mg/dl): 130 Reference Range: 80 - 120 mg / dl Notes glucose per pt report Electronic Signature(s) Signed: 07/03/2020 5:41:41 PM By: Levan Hurst RN, BSN Entered By: Levan Hurst on 07/02/2020 08:50:45

## 2020-07-03 NOTE — Assessment & Plan Note (Signed)
Working with orthopedics and PT on this.

## 2020-07-04 NOTE — Progress Notes (Signed)
Deanna, Levine ([id number]) Visit Report for 06/18/2020 Arrival Information Details Patient Name: Date of Service: Deanna Levine, Deanna Levine 06/18/2020 8:15 A M Medical Record Number: [id number] Patient Account Number: [id number] Date of Birth/Sex: Treating RN: 2001-02-02 (20 y.o. Deanna Levine Primary Care Ridwan Bondy: Pricilla Holm Other Clinician: Referring Francile Woolford: Treating Esco Joslyn/Extender: Dreama Saa in Treatment: 2 Visit Information History Since Last Visit Added or deleted any medications: No Patient Arrived: Ambulatory Any new allergies or adverse reactions: No Arrival Time: 08:16 Had a fall or experienced change in No Accompanied By: mother activities of daily living that may affect Transfer Assistance: None risk of falls: Patient Identification Verified: Yes Signs or symptoms of abuse/neglect since last visito No Secondary Verification Process Completed: Yes Hospitalized since last visit: No Patient Requires Transmission-Based Precautions: No Implantable device outside of the clinic excluding No Patient Has Alerts: No cellular tissue based products placed in the center since last visit: Has Dressing in Place as Prescribed: No Pain Present Now: No Electronic Signature(s) Signed: 06/19/2020 5:27:18 PM By: Levan Hurst RN, BSN Entered By: Levan Hurst on 06/18/2020 08:18:11 -------------------------------------------------------------------------------- Clinic Level of Care Assessment Details Patient Name: Date of Service: Deanna Levine, Deanna Levine 06/18/2020 8:15 A M Medical Record Number: [id number] Patient Account Number: [id number] Date of Birth/Sex: Treating RN: 04-18-2000 (20 y.o. Deanna Levine Primary Care Miquan Tandon: Pricilla Holm Other Clinician: Referring Fizza Scales: Treating Anderson Coppock/Extender: Dreama Saa in Treatment: 2 Clinic Level of Care Assessment Items TOOL 4 Quantity Score []  -  0 Use when only an EandM is performed on FOLLOW-UP visit ASSESSMENTS - Nursing Assessment / Reassessment X- 1 10 Reassessment of Co-morbidities (includes updates in patient status) X- 1 5 Reassessment of Adherence to Treatment Plan ASSESSMENTS - Wound and Skin A ssessment / Reassessment []  - 0 Simple Wound Assessment / Reassessment - one wound X- 2 5 Complex Wound Assessment / Reassessment - multiple wounds []  - 0 Dermatologic / Skin Assessment (not related to wound area) ASSESSMENTS - Focused Assessment []  - 0 Circumferential Edema Measurements - multi extremities []  - 0 Nutritional Assessment / Counseling / Intervention []  - 0 Lower Extremity Assessment (monofilament, tuning fork, pulses) []  - 0 Peripheral Arterial Disease Assessment (using hand held doppler) ASSESSMENTS - Ostomy and/or Continence Assessment and Care []  - 0 Incontinence Assessment and Management []  - 0 Ostomy Care Assessment and Management (repouching, etc.) PROCESS - Coordination of Care X - Simple Patient / Family Education for ongoing care 1 15 []  - 0 Complex (extensive) Patient / Family Education for ongoing care X- 1 10 Staff obtains Programmer, systems, Records, T Results / Process Orders est []  - 0 Staff telephones HHA, Nursing Homes / Clarify orders / etc []  - 0 Routine Transfer to another Facility (non-emergent condition) []  - 0 Routine Hospital Admission (non-emergent condition) []  - 0 New Admissions / Biomedical engineer / Ordering NPWT Apligraf, etc. , []  - 0 Emergency Hospital Admission (emergent condition) X- 1 10 Simple Discharge Coordination []  - 0 Complex (extensive) Discharge Coordination PROCESS - Special Needs []  - 0 Pediatric / Minor Patient Management []  - 0 Isolation Patient Management []  - 0 Hearing / Language / Visual special needs []  - 0 Assessment of Community assistance (transportation, D/C planning, etc.) []  - 0 Additional assistance / Altered mentation []  -  0 Support Surface(s) Assessment (bed, cushion, seat, etc.) INTERVENTIONS - Wound Cleansing / Measurement []  - 0 Simple Wound Cleansing - one wound X- 2 5 Complex Wound Cleansing - multiple wounds X-  1 5 Wound Imaging (photographs - any number of wounds) []  - 0 Wound Tracing (instead of photographs) []  - 0 Simple Wound Measurement - one wound X- 2 5 Complex Wound Measurement - multiple wounds INTERVENTIONS - Wound Dressings X - Small Wound Dressing one or multiple wounds 2 10 []  - 0 Medium Wound Dressing one or multiple wounds []  - 0 Large Wound Dressing one or multiple wounds X- 1 5 Application of Medications - topical []  - 0 Application of Medications - injection INTERVENTIONS - Miscellaneous []  - 0 External ear exam []  - 0 Specimen Collection (cultures, biopsies, blood, body fluids, etc.) []  - 0 Specimen(s) / Culture(s) sent or taken to Lab for analysis []  - 0 Patient Transfer (multiple staff / Civil Service fast streamer / Similar devices) []  - 0 Simple Staple / Suture removal (25 or less) []  - 0 Complex Staple / Suture removal (26 or more) []  - 0 Hypo / Hyperglycemic Management (close monitor of Blood Glucose) []  - 0 Ankle / Brachial Index (ABI) - do not check if billed separately X- 1 5 Vital Signs Has the patient been seen at the hospital within the last three years: Yes Total Score: 115 Level Of Care: New/Established - Level 3 Electronic Signature(s) Signed: 06/18/2020 6:20:01 PM By: Baruch Gouty RN, BSN Entered By: Baruch Gouty on 06/18/2020 08:31:54 -------------------------------------------------------------------------------- Encounter Discharge Information Details Patient Name: Date of Service: Deanna Levine, Deanna Levine 06/18/2020 8:15 A M Medical Record Number: [id number] Patient Account Number: [id number] Date of Birth/Sex: Treating RN: [birth year] (20 y.o. Deanna Levine, Lauren Primary Care Karess Harner: Pricilla Holm Other Clinician: Referring Tamyah Cutbirth: Treating  Camil Hausmann/Extender: Dreama Saa in Treatment: 2 Encounter Discharge Information Items Discharge Condition: Stable Ambulatory Status: Ambulatory Discharge Destination: Home Transportation: Private Auto Accompanied By: self Schedule Follow-up Appointment: Yes Clinical Summary of Care: Patient Declined Electronic Signature(s) Signed: 07/04/2020 3:14:58 PM By: Rhae Hammock RN Entered By: Rhae Hammock on 06/18/2020 08:50:58 -------------------------------------------------------------------------------- Grant Details Patient Name: Date of Service: Deanna Levine, Deanna Levine 06/18/2020 8:15 A M Medical Record Number: [id number] Patient Account Number: [id number] Date of Birth/Sex: Treating RN: 07-Oct-2000 (20 y.o. Deanna Levine Primary Care Burnie Therien: Pricilla Holm Other Clinician: Referring Khaya Theissen: Treating Kimika Streater/Extender: Dreama Saa in Treatment: 2 Multidisciplinary Care Plan reviewed with physician Active Inactive Soft Tissue Infection Nursing Diagnoses: Impaired tissue integrity Potential for infection: soft tissue Goals: Patient's soft tissue infection will resolve Date Initiated: 06/04/2020 Target Resolution Date: 07/02/2020 Goal Status: Active Interventions: Assess signs and symptoms of infection every visit Provide education on infection Notes: Wound/Skin Impairment Nursing Diagnoses: Impaired tissue integrity Knowledge deficit related to ulceration/compromised skin integrity Goals: Patient/caregiver will verbalize understanding of skin care regimen Date Initiated: 06/04/2020 Target Resolution Date: 07/02/2020 Goal Status: Active Ulcer/skin breakdown will have a volume reduction of 30% by week 4 Date Initiated: 06/04/2020 Target Resolution Date: 07/02/2020 Goal Status: Active Interventions: Assess patient/caregiver ability to obtain necessary supplies Assess patient/caregiver  ability to perform ulcer/skin care regimen upon admission and as needed Assess ulceration(s) every visit Provide education on ulcer and skin care Treatment Activities: Topical wound management initiated : 06/04/2020 Notes: Electronic Signature(s) Signed: 06/18/2020 6:20:01 PM By: Baruch Gouty RN, BSN Entered By: Baruch Gouty on 06/18/2020 08:21:48 -------------------------------------------------------------------------------- Pain Assessment Details Patient Name: Date of Service: Deanna Levine, Deanna Levine 06/18/2020 8:15 A M Medical Record Number: [id number] Patient Account Number: [id number] Date of Birth/Sex: Treating RN: [birth year] (20 y.o. Deanna Levine Primary Care Ivan Maskell: Pricilla Holm Other Clinician: Referring Horatio Bertz: Treating  Broughton Eppinger/Extender: Dreama Saa in Treatment: 2 Active Problems Location of Pain Severity and Description of Pain Patient Has Paino No Site Locations Pain Management and Medication Current Pain Management: Electronic Signature(s) Signed: 06/19/2020 5:27:18 PM By: Levan Hurst RN, BSN Entered By: Levan Hurst on 06/18/2020 08:18:44 -------------------------------------------------------------------------------- Patient/Caregiver Education Details Patient Name: Date of Service: Clarene Essex 4/20/2022andnbsp8:15 A M Medical Record Number: [id number] Patient Account Number: [id number] Date of Birth/Gender: Treating RN: [birth year] (20 y.o. Deanna Levine Primary Care Physician: Pricilla Holm Other Clinician: Referring Physician: Treating Physician/Extender: Dreama Saa in Treatment: 2 Education Assessment Education Provided To: Patient Education Topics Provided Infection: Methods: Explain/Verbal Responses: Reinforcements needed, State content correctly Wound/Skin Impairment: Methods: Explain/Verbal Responses: Reinforcements needed, State content  correctly Electronic Signature(s) Signed: 06/18/2020 6:20:01 PM By: Baruch Gouty RN, BSN Entered By: Baruch Gouty on 06/18/2020 08:22:09 -------------------------------------------------------------------------------- Wound Assessment Details Patient Name: Date of Service: STORMIE, VENTOLA 06/18/2020 8:15 A M Medical Record Number: [id number] Patient Account Number: [id number] Date of Birth/Sex: Treating RN: [birth year] (20 y.o. Deanna Levine Primary Care Nettie Wyffels: Pricilla Holm Other Clinician: Referring Tyrees Chopin: Treating Galit Urich/Extender: Dreama Saa in Treatment: 2 Wound Status Wound Number: 1 Primary Hidradenitis Etiology: Wound Location: Right, Proximal Breast Wound Open Wounding Event: Gradually Appeared Status: Date Acquired: 11/30/2019 Comorbid Chronic sinus problems/congestion, Type II Diabetes, Weeks Of Treatment: 2 History: Osteoarthritis, Seizure Disorder Clustered Wound: No Photos Wound Measurements Length: (cm) 0.7 Width: (cm) 0.5 Depth: (cm) 0.1 Area: (cm) 0.275 Volume: (cm) 0.027 % Reduction in Area: -485.1% % Reduction in Volume: -440% Epithelialization: None Tunneling: No Undermining: No Wound Description Classification: Full Thickness Without Exposed Support Structures Wound Margin: Distinct, outline attached Exudate Amount: Medium Exudate Type: Serosanguineous Exudate Color: red, brown Foul Odor After Cleansing: No Slough/Fibrino Yes Wound Bed Granulation Amount: Large (67-100%) Exposed Structure Granulation Quality: Red Fascia Exposed: No Necrotic Amount: Small (1-33%) Fat Layer (Subcutaneous Tissue) Exposed: Yes Necrotic Quality: Adherent Slough Tendon Exposed: No Muscle Exposed: No Joint Exposed: No Bone Exposed: No Electronic Signature(s) Signed: 06/18/2020 4:38:38 PM By: Sandre Kitty Signed: 06/19/2020 5:27:18 PM By: Levan Hurst RN, BSN Entered By: Sandre Kitty on 06/18/2020  16:35:13 -------------------------------------------------------------------------------- Wound Assessment Details Patient Name: Date of Service: LAMIS, BEHRMANN 06/18/2020 8:15 A M Medical Record Number: [id number] Patient Account Number: [id number] Date of Birth/Sex: Treating RN: January 31, 2001 (20 y.o. Deanna Levine Primary Care Ethanael Veith: Pricilla Holm Other Clinician: Referring Montasia Chisenhall: Treating Darien Kading/Extender: Dreama Saa in Treatment: 2 Wound Status Wound Number: 2 Primary Hidradenitis Etiology: Wound Location: Right, Distal Breast Wound Open Wounding Event: Gradually Appeared Status: Date Acquired: 11/30/2019 Comorbid Chronic sinus problems/congestion, Type II Diabetes, Weeks Of Treatment: 2 History: Osteoarthritis, Seizure Disorder Clustered Wound: No Photos Wound Measurements Length: (cm) 0.3 Width: (cm) 0.3 Depth: (cm) 0.1 Area: (cm) 0.071 Volume: (cm) 0.007 % Reduction in Area: 24.5% % Reduction in Volume: 22.2% Epithelialization: None Tunneling: No Undermining: No Wound Description Classification: Full Thickness Without Exposed Support Structures Wound Margin: Distinct, outline attached Exudate Amount: Medium Exudate Type: Serosanguineous Exudate Color: red, brown Foul Odor After Cleansing: No Slough/Fibrino No Wound Bed Granulation Amount: Large (67-100%) Exposed Structure Granulation Quality: Red Fascia Exposed: No Necrotic Amount: None Present (0%) Fat Layer (Subcutaneous Tissue) Exposed: Yes Tendon Exposed: No Muscle Exposed: No Joint Exposed: No Bone Exposed: No Electronic Signature(s) Signed: 06/18/2020 4:38:38 PM By: Sandre Kitty Signed: 06/19/2020 5:27:18 PM By: Levan Hurst RN, BSN Entered By: Sandre Kitty on  06/18/2020 16:35:40 -------------------------------------------------------------------------------- Vitals Details Patient Name: Date of Service: Deanna Levine, Deanna Levine 06/18/2020  8:15 A M Medical Record Number: [id number] Patient Account Number: [id number] Date of Birth/Sex: Treating RN: 07/04/2000 (20 y.o. Deanna Levine Primary Care Jeyden Coffelt: Pricilla Holm Other Clinician: Referring Patrick Sohm: Treating Burdell Peed/Extender: Dreama Saa in Treatment: 2 Vital Signs Time Taken: 08:18 Temperature (F): 97.9 Height (in): 64 Pulse (bpm): 89 Weight (lbs): 214 Respiratory Rate (breaths/min): 16 Body Mass Index (BMI): 36.7 Blood Pressure (mmHg): 111/74 Capillary Blood Glucose (mg/dl): 130 Reference Range: 80 - 120 mg / dl Notes glucose per pt report Electronic Signature(s) Signed: 06/19/2020 5:27:18 PM By: Levan Hurst RN, BSN Entered By: Levan Hurst on 06/18/2020 08:18:36

## 2020-07-16 ENCOUNTER — Ambulatory Visit (INDEPENDENT_AMBULATORY_CARE_PROVIDER_SITE_OTHER): Payer: 59 | Admitting: Physician Assistant

## 2020-07-16 ENCOUNTER — Ambulatory Visit (INDEPENDENT_AMBULATORY_CARE_PROVIDER_SITE_OTHER): Payer: 59 | Admitting: Pulmonary Disease

## 2020-07-16 ENCOUNTER — Ambulatory Visit (INDEPENDENT_AMBULATORY_CARE_PROVIDER_SITE_OTHER): Payer: 59 | Admitting: Otolaryngology

## 2020-07-16 ENCOUNTER — Ambulatory Visit: Payer: 59 | Admitting: Physician Assistant

## 2020-07-16 ENCOUNTER — Encounter: Payer: Self-pay | Admitting: Physician Assistant

## 2020-07-16 ENCOUNTER — Other Ambulatory Visit: Payer: Self-pay

## 2020-07-16 ENCOUNTER — Encounter: Payer: Self-pay | Admitting: Pulmonary Disease

## 2020-07-16 VITALS — Temp 97.2°F

## 2020-07-16 VITALS — BP 122/74 | HR 89 | Ht 64.0 in | Wt 218.0 lb

## 2020-07-16 DIAGNOSIS — J310 Chronic rhinitis: Secondary | ICD-10-CM | POA: Diagnosis not present

## 2020-07-16 DIAGNOSIS — H6123 Impacted cerumen, bilateral: Secondary | ICD-10-CM

## 2020-07-16 DIAGNOSIS — R0683 Snoring: Secondary | ICD-10-CM | POA: Diagnosis not present

## 2020-07-16 DIAGNOSIS — M6702 Short Achilles tendon (acquired), left ankle: Secondary | ICD-10-CM

## 2020-07-16 DIAGNOSIS — J301 Allergic rhinitis due to pollen: Secondary | ICD-10-CM | POA: Diagnosis not present

## 2020-07-16 DIAGNOSIS — M6701 Short Achilles tendon (acquired), right ankle: Secondary | ICD-10-CM

## 2020-07-16 MED ORDER — FLUTICASONE PROPIONATE 50 MCG/ACT NA SUSP: 2.0000 | Freq: Every day | NASAL | 6 refills | Status: DC

## 2020-07-16 MED ORDER — AZELASTINE HCL 0.1 % NA SOLN: 1.0000 | Freq: Two times a day (BID) | NASAL | 12 refills | Status: DC

## 2020-07-16 NOTE — Patient Instructions (Addendum)
Take Zyrtec 35m daily for seasonal allergies  Start using flonase 2 sprays per nostril daily  [facility] a [facility]

## 2020-07-16 NOTE — Progress Notes (Signed)
Synopsis: Referred in May 2022 for cough  Subjective:   PATIENT ID: Deanna Levine GENDER: female DOB: 2000-05-19, MRN: [id number]   HPI  Chief Complaint  Patient presents with  . Consult    Cough and snoring at night   Deanna Levine is a 20 year old woman, never smoker with developmental delay, DMII, and anemia who is referred to pulmonary clinic for evaluation of cough and snoring.   [name] accompanied by her mother. They report she has been having increasing sinus congestion, nasal drainage with post-nasal drip and cough over recent weeks. She has trouble with these symptoms in the spring and fall. They have also noticed an increase in snoring at night. She reports about a 20lbs weight gain over the past 2 years. She does report shortness of breath with exertion but no concerns for wheezing or chest tightness. [name] undergoing physical therapy for her recent bilateral gastrocnemius recession and doing well with being able to complete her activities.    [name] not currently using nasal sprays. [name] taking zyrtec 21m daily for allergies.   She denies smoking. [name] working a [employer]   Past Medical History:  Diagnosis Date  . Allergy   . Complication of anesthesia    slow to wake up from anesthesia, occ HA with anesthesia   . [place]    type 2  . Difficulty swallowing pills   . Dyspraxia   . Global developmental delay   . Obesity (BMI 30-39.9)   . PONV (postoperative nausea and vomiting)   . Precocious female puberty   . [place]    [place] seizure 2012 - no current med     Family History  Problem Relation Age of Onset  . Diabetes Mother        steroid induced; hx. colitis  . Anesthesia problems Mother        severe headache lasting 2-3 days  . Rheum arthritis Mother   . Ulcerative colitis Mother   . Fibromyalgia Mother   . Hypertension Maternal Grandmother   . Hyperlipidemia Maternal Grandmother    . Diabetes Maternal Grandmother   . Kidney disease Maternal Grandfather   . Hypertension Maternal Grandfather   . Heart disease Maternal Grandfather   . Hyperlipidemia Maternal Grandfather   . Kidney disease Paternal Grandmother   . Hypertension Paternal Grandmother   . Diabetes Paternal Grandmother   . Heart disease Paternal Grandmother   . Hyperlipidemia Paternal Grandmother   . Hypertension Paternal Grandfather   . Hyperlipidemia Paternal Grandfather   . Henoch-Schonlein purpura Brother        in remission  . Asthma Maternal Aunt   . Seizures Maternal Uncle      Social History   Socioeconomic History  . Marital status: Single    Spouse name: Not on file  . Number of children: 12  . Years of education: Not on file  . Highest education level: Not on file  Occupational History  . 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 Smoking status: Never Smoker  . Smokeless tobacco: Never Used  Vaping Use  . Vaping Use: Never used  Substance and Sexual Activity  . Alcohol use: No    Alcohol/week: 0.0 standard drinks  . Drug use: No  . Sexual activity: Never    Birth control/protection: None  Other Topics Concern  . Not on file  Social History Narrative   AStefaniis graduated. She  is doing average.    She enjoys playing softball and basketball.   Lives with her parents.    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    Left handed   Two story home   Social Determinants of Health   Financial Resource Strain: Not on file  Food Insecurity: Not on file  Transportation Needs: Not on file  Physical Activity: Not on file  Stress: Not on file  Social Connections: Not on file  Intimate Partner Violence: Not on file     No Known Allergies   Outpatient Medications Prior to Visit  Medication Sig Dispense Refill  . ACCU-CHEK GUIDE test strip USE TO TEST BLOOD SUGAR LEVELS 4 TIMES DAILY 150 strip 5  . Accu-Chek Softclix Lancets lancets Use to test up to 6x daily  200 each 5  . blood glucose meter kit and supplies Dispense based on patient and insurance preference. Use up to four times daily as directed. (FOR ICD-10 E10.9, E11.9). 1 each 0  . Blood Glucose Monitoring Suppl (ONETOUCH VERIO) w/Device KIT 1 kit by Does not apply route as directed. Use glucometer to check sugar up to 3x per day (E11.9) 1 kit 3  . cetirizine (ZYRTEC) 10 MG tablet Take 10 mg by mouth daily.    . chlorhexidine (HIBICLENS) 4 % external liquid Apply topically daily as needed. 120 mL 0  . cholecalciferol (VITAMIN D3) 25 MCG (1000 UNIT) tablet Take 1,000 Units by mouth daily.    . clindamycin-benzoyl peroxide (BENZACLIN) gel Apply topically 2 (two) times daily. (Patient taking differently: Apply 1 application topically 2 (two) times daily as needed (acne).) 25 g 0  . Continuous Blood Gluc Receiver (DEXCOM G6 RECEIVER) DEVI 1 Device by Does not apply route as directed. 1 each 2  . Continuous Blood Gluc Sensor (DEXCOM G6 SENSOR) MISC Inject 1 applicator into the skin as directed. (change sensor every 10 days) 3 each 11  . Continuous Blood Gluc Transmit (DEXCOM G6 TRANSMITTER) MISC Inject 1 Device into the skin as directed. (re-use up to 8x with each new sensor) 1 each 3  . diclofenac Sodium (VOLTAREN) 1 % GEL Apply to right foot before work and after work for 60 days. 350 g 1  . HYDROcodone-acetaminophen (HYCET) 7.5-325 mg/15 ml solution Take 15 mLs by mouth every 6 (six) hours as needed for moderate pain. 120 mL 0  . Menaquinone-7 (VITAMIN K2) 100 MCG CAPS Take 100 mcg by mouth daily.    . Multiple Vitamins-Minerals (MULTIVITAMIN ADULT) CHEW Chew 1 tablet by mouth daily.    Marland Kitchen olopatadine (PATANOL) 0.1 % ophthalmic solution Place 1 drop into both eyes 2 (two) times daily as needed for allergies.    . Semaglutide,0.25 or 0.5MG/DOS, (OZEMPIC, 0.25 OR 0.5 MG/DOSE,) 2 MG/1.5ML SOPN Inject 0.25 mg into the skin once a week. 1.5 mL 11  . triamcinolone (NASACORT) 55 MCG/ACT AERO nasal inhaler  Place 2 sprays into the nose daily as needed (allergies).     No facility-administered medications prior to visit.    Review of Systems  Constitutional: Negative for chills, fever, malaise/fatigue and weight loss.  HENT: Positive for congestion. Negative for sinus pain and sore throat.   Eyes: Negative.   Respiratory: Positive for cough. Negative for hemoptysis, sputum production, shortness of breath and wheezing.   Cardiovascular: Negative for chest pain, palpitations, orthopnea, claudication and leg swelling.  Gastrointestinal: Negative for abdominal pain, heartburn, nausea and vomiting.  Genitourinary: Negative.   Musculoskeletal: Negative for joint  pain and myalgias.  Skin: Negative for rash.  Neurological: Negative for weakness.  Endo/Heme/Allergies: Negative.   Psychiatric/Behavioral: Negative.     Objective:   Vitals:   07/16/20 1041  BP: 122/74  Pulse: 89  SpO2: 98%  Weight: 218 lb (98.9 kg)  Height: 5' 4"  (1.626 m)     Physical Exam Constitutional:      General: [name] not in acute distress.    Appearance: Normal appearance. [name] obese. [name] not ill-appearing.  HENT:     Head: Normocephalic and atraumatic.     Nose: Congestion present.     Mouth/Throat:     Mouth: Mucous membranes are moist.     Pharynx: Oropharynx is clear.     Comments: Mallampati class III-IV Eyes:     General: No scleral icterus.    Conjunctiva/sclera: Conjunctivae normal.     Pupils: Pupils are equal, round, and reactive to light.  Cardiovascular:     Rate and Rhythm: Normal rate and regular rhythm.     Pulses: Normal pulses.     Heart sounds: Normal heart sounds. No murmur heard.   Pulmonary:     Effort: Pulmonary effort is normal.     Breath sounds: Normal breath sounds. No wheezing, rhonchi or rales.  Abdominal:     General: Bowel sounds are normal.     Palpations: Abdomen is soft.  Musculoskeletal:     Right lower leg: No edema.     Left lower leg: No edema.   Lymphadenopathy:     Cervical: No cervical adenopathy.  Skin:    General: Skin is warm and dry.  Neurological:     General: No focal deficit present.     Mental Status: [name] alert.  Psychiatric:        Mood and Affect: Mood normal.        Behavior: Behavior normal.        Thought Content: Thought content normal.        Judgment: Judgment normal.       CBC    Component Value Date/Time   WBC 9.4 07/02/2020 1058   RBC 5.05 07/02/2020 1058   HGB 10.5 (L) 07/02/2020 1058   HGB 11.3 03/05/2019 1430   HCT 33.7 (L) 07/02/2020 1058   HCT 35.7 03/05/2019 1430   PLT 319.0 07/02/2020 1058   PLT 182 03/05/2019 1430   MCV 66.6 Repeated and verified X2. (L) 07/02/2020 1058   MCV 67 (L) 03/05/2019 1430   MCH 21.6 (L) 04/25/2020 0646   MCHC 31.1 07/02/2020 1058   RDW 17.1 (H) 07/02/2020 1058   RDW 17.9 (H) 03/05/2019 1430   LYMPHSABS 2,461 02/07/2020 1127   MONOABS 0.5 07/05/2012 1610   EOSABS 50 02/07/2020 1127   BASOSABS 17 02/07/2020 1127     Chest imaging: None on file  PFT: No flowsheet data found.  Labs: 07/02/20 - TSH 2.61    Assessment & Plan:   Allergic rhinitis due to pollen, unspecified seasonality - Plan: fluticasone (FLONASE) 50 MCG/ACT nasal spray, azelastine (ASTELIN) 0.1 % nasal spray  Snoring - Plan: Polysomnography 4 or more parameters (NPSG)  Discussion: Deanna Levine is a 20 year old woman, never smoker with developmental delay, DMII, and anemia who is referred to pulmonary clinic for evaluation of cough and snoring.  She has chronic rhinnitis and post nasal drainage that appears to be related to seasonal allergies. Her cough is secondary to the post-nasal drainage. [name] to start using flonase nasal  spray 2 sprays per nostril daily along with astelin nasal spray, 1 spray twice daily per nostril. [name] to continue zyrtec.   For the concern of underlying sleep apnea given her increase in snoring symptoms we will schedule her for a sleep study at  [facility].   [name] to follow up in 3 months.   Freda Jackson, MD [facility] Office: [phone]    Current Outpatient Medications:  .  ACCU-CHEK GUIDE test strip, USE TO TEST BLOOD SUGAR LEVELS 4 TIMES DAILY, Disp: 150 strip, Rfl: 5 .  Accu-Chek Softclix Lancets lancets, Use to test up to 6x daily, Disp: 200 each, Rfl: 5 .  azelastine (ASTELIN) 0.1 % nasal spray, Place 1 spray into both nostrils 2 (two) times daily. Use in each nostril as directed, Disp: 30 mL, Rfl: 12 .  blood glucose meter kit and supplies, Dispense based on patient and insurance preference. Use up to four times daily as directed. (FOR ICD-10 E10.9, E11.9)., Disp: 1 each, Rfl: 0 .  Blood Glucose Monitoring Suppl (ONETOUCH VERIO) w/Device KIT, 1 kit by Does not apply route as directed. Use glucometer to check sugar up to 3x per day (E11.9), Disp: 1 kit, Rfl: 3 .  cetirizine (ZYRTEC) 10 MG tablet, Take 10 mg by mouth daily., Disp: , Rfl:  .  chlorhexidine (HIBICLENS) 4 % external liquid, Apply topically daily as needed., Disp: 120 mL, Rfl: 0 .  cholecalciferol (VITAMIN D3) 25 MCG (1000 UNIT) tablet, Take 1,000 Units by mouth daily., Disp: , Rfl:  .  clindamycin-benzoyl peroxide (BENZACLIN) gel, Apply topically 2 (two) times daily. (Patient taking differently: Apply 1 application topically 2 (two) times daily as needed (acne).), Disp: 25 g, Rfl: 0 .  Continuous Blood Gluc Receiver (DEXCOM G6 RECEIVER) DEVI, 1 Device by Does not apply route as directed., Disp: 1 each, Rfl: 2 .  Continuous Blood Gluc Sensor (DEXCOM G6 SENSOR) MISC, Inject 1 applicator into the skin as directed. (change sensor every 10 days), Disp: 3 each, Rfl: 11 .  Continuous Blood Gluc Transmit (DEXCOM G6 TRANSMITTER) MISC, Inject 1 Device into the skin as directed. (re-use up to 8x with each new sensor), Disp: 1 each, Rfl: 3 .  diclofenac Sodium (VOLTAREN) 1 % GEL, Apply to right foot before work and after work for 60  days., Disp: 350 g, Rfl: 1 .  fluticasone (FLONASE) 50 MCG/ACT nasal spray, Place 2 sprays into both nostrils daily., Disp: 16 g, Rfl: 6 .  HYDROcodone-acetaminophen (HYCET) 7.5-325 mg/15 ml solution, Take 15 mLs by mouth every 6 (six) hours as needed for moderate pain., Disp: 120 mL, Rfl: 0 .  Menaquinone-7 (VITAMIN K2) 100 MCG CAPS, Take 100 mcg by mouth daily., Disp: , Rfl:  .  Multiple Vitamins-Minerals (MULTIVITAMIN ADULT) CHEW, Chew 1 tablet by mouth daily., Disp: , Rfl:  .  olopatadine (PATANOL) 0.1 % ophthalmic solution, Place 1 drop into both eyes 2 (two) times daily as needed for allergies., Disp: , Rfl:  .  Semaglutide,0.25 or 0.5MG/DOS, (OZEMPIC, 0.25 OR 0.5 MG/DOSE,) 2 MG/1.5ML SOPN, Inject 0.25 mg into the skin once a week., Disp: 1.5 mL, Rfl: 11 .  triamcinolone (NASACORT) 55 MCG/ACT AERO nasal inhaler, Place 2 sprays into the nose daily as needed (allergies)., Disp: , Rfl:

## 2020-07-16 NOTE — Progress Notes (Signed)
HPI: Deanna Levine is a 20 y.o. female who presents is referred by her PCP for evaluation of sinus and ear complaints..  She has history of allergies and has nasal congestion.  She presents today with her mother.  She has been prescribed Flonase or nasal steroid spray but has not been using this regularly.  She apparently also makes a lot of wax in her ears.  She just recently had her ears cleaned.  Past Medical History:  Diagnosis Date  . Allergy   . Complication of anesthesia    slow to wake up from anesthesia, occ HA with anesthesia   . Diabetes mellitus without complication ([place])    type 2  . Difficulty swallowing pills   . Dyspraxia   . Global developmental delay   . Obesity (BMI 30-39.9)   . PONV (postoperative nausea and vomiting)   . Precocious female puberty   . Seizures ([place])    last seizure 2012 - no current med   Past Surgical History:  Procedure Laterality Date  . GASTROCNEMIUS RECESSION Bilateral 04/25/2020   Procedure: BILATERAL GASTROCNEMIUS RECESSION;  Surgeon: Newt Minion, MD;  Location: [place];  Service: Orthopedics;  Laterality: Bilateral;  . [place] IMPLANT  05/06/2011   Procedure: SUPPRELIN IMPLANT;  Surgeon: Jerilynn Mages. Gerald Stabs, MD;  Location: [place];  Service: Pediatrics;  Laterality: Right;  . SUPPRELIN IMPLANT Right 06/14/2013   Procedure: REMOVAL OF SUPPRELIN IMPLANT FROM RIGHT UPPER ARM;  Surgeon: Jerilynn Mages. Gerald Stabs, MD;  Location: [place];  Service: Pediatrics;  Laterality: Right;  . TONSILLECTOMY AND ADENOIDECTOMY    . TYMPANOSTOMY TUBE PLACEMENT     x 2   Social History   Socioeconomic History  . Marital status: Single    Spouse name: Not on file  . Number of children: 12  . Years of education: Not on file  . Highest education level: Not on file  Occupational History  . 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 Smoking status: Never Smoker  . Smokeless tobacco: Never Used  Vaping Use  . Vaping Use: Never  used  Substance and Sexual Activity  . Alcohol use: No    Alcohol/week: 0.0 standard drinks  . Drug use: No  . Sexual activity: Never    Birth control/protection: None  Other Topics Concern  . Not on file  Social History Narrative   [name] is graduated. [name] doing average.    She enjoys playing softball and basketball.   Lives with her parents.    Volunteers Monday- Thursday at [employer] and [employer] .    Left handed   Two story home   Social Determinants of Health   Financial Resource Strain: Not on file  Food Insecurity: Not on file  Transportation Needs: Not on file  Physical Activity: Not on file  Stress: Not on file  Social Connections: Not on file   Family History  Problem Relation Age of Onset  . Diabetes Mother        steroid induced; hx. colitis  . Anesthesia problems Mother        severe headache lasting 2-3 days  . Rheum arthritis Mother   . Ulcerative colitis Mother   . Fibromyalgia Mother   . Hypertension Maternal Grandmother   . Hyperlipidemia Maternal Grandmother   . Diabetes Maternal Grandmother   . Kidney disease Maternal Grandfather   . Hypertension Maternal Grandfather   . Heart disease Maternal Grandfather   . Hyperlipidemia Maternal Grandfather   .  Kidney disease Paternal Grandmother   . Hypertension Paternal Grandmother   . Diabetes Paternal Grandmother   . Heart disease Paternal Grandmother   . Hyperlipidemia Paternal Grandmother   . Hypertension Paternal Grandfather   . Hyperlipidemia Paternal Grandfather   . Henoch-Schonlein purpura Brother        in remission  . Asthma Maternal Aunt   . Seizures Maternal Uncle    No Known Allergies Prior to Admission medications   Medication Sig Start Date End Date Taking? Authorizing Provider  ACCU-CHEK GUIDE test strip USE TO TEST BLOOD SUGAR LEVELS 4 TIMES DAILY 01/07/20   Lelon Huh, MD  Accu-Chek Softclix Lancets lancets Use to test up to 6x daily 12/12/19   Lelon Huh, MD   azelastine (ASTELIN) 0.1 % nasal spray Place 1 spray into both nostrils 2 (two) times daily. Use in each nostril as directed 07/16/20   Freddi Starr, MD  blood glucose meter kit and supplies Dispense based on patient and insurance preference. Use up to four times daily as directed. (FOR ICD-10 E10.9, E11.9). 12/12/19   Lelon Huh, MD  Blood Glucose Monitoring Suppl (ONETOUCH VERIO) w/Device KIT 1 kit by Does not apply route as directed. Use glucometer to check sugar up to 3x per day (E11.9) 12/12/19   Lelon Huh, MD  cetirizine (ZYRTEC) 10 MG tablet Take 10 mg by mouth daily.    [provider]  chlorhexidine (HIBICLENS) 4 % external liquid Apply topically daily as needed. 05/26/20   [name], MD  cholecalciferol (VITAMIN D3) 25 MCG (1000 UNIT) tablet Take 1,000 Units by mouth daily.    [provider]  clindamycin-benzoyl peroxide (BENZACLIN) gel Apply topically 2 (two) times daily. Patient taking differently: Apply 1 application topically 2 (two) times daily as needed (acne). 01/17/20   Lelon Huh, MD  Continuous Blood Gluc Receiver (DEXCOM G6 RECEIVER) DEVI 1 Device by Does not apply route as directed. 12/12/19   Lelon Huh, MD  Continuous Blood Gluc Sensor (DEXCOM G6 SENSOR) MISC Inject 1 applicator into the skin as directed. (change sensor every 10 days) 12/12/19   Lelon Huh, MD  Continuous Blood Gluc Transmit (DEXCOM G6 TRANSMITTER) MISC Inject 1 Device into the skin as directed. (re-use up to 8x with each new sensor) 12/12/19   Lelon Huh, MD  diclofenac Sodium (VOLTAREN) 1 % GEL Apply to right foot before work and after work for 60 days. 12/12/19   Lelon Huh, MD  fluticasone (FLONASE) 50 MCG/ACT nasal spray Place 2 sprays into both nostrils daily. 07/16/20   Freddi Starr, MD  HYDROcodone-acetaminophen (HYCET) 7.5-325 mg/15 ml solution Take 15 mLs by mouth every 6 (six) hours as needed for moderate pain. 04/25/20  04/25/21  Newt Minion, MD  Menaquinone-7 (VITAMIN K2) 100 MCG CAPS Take 100 mcg by mouth daily.    [provider]  Multiple Vitamins-Minerals (MULTIVITAMIN ADULT) CHEW Chew 1 tablet by mouth daily.    [provider]  olopatadine (PATANOL) 0.1 % ophthalmic solution Place 1 drop into both eyes 2 (two) times daily as needed for allergies.    [provider]  Semaglutide,0.25 or 0.5MG/DOS, (OZEMPIC, 0.25 OR 0.5 MG/DOSE,) 2 MG/1.5ML SOPN Inject 0.25 mg into the skin once a week. 12/12/19   Lelon Huh, MD  triamcinolone (NASACORT) 55 MCG/ACT AERO nasal inhaler Place 2 sprays into the nose daily as needed (allergies).    [provider]     Positive ROS: Otherwise negative  All other systems have been  reviewed and were otherwise negative with the exception of those mentioned in the HPI and as above.  Physical Exam: Constitutional: Alert, well-appearing, no acute distress Ears: External ears without lesions or tenderness.  She had moderate wax buildup in both ear canals that was cleaned with curettes.  TMs were clear bilaterally with good mobility on pneumatic otoscopy.  No middle ear effusion noted.  On tuning fork testing hearing seemed good in both ears. Nasal: External nose without lesions. Septum midline with mild rhinitis.  No polyps noted both middle meatus regions are clear..  No signs of infection. Oral: Lips and gums without lesions. Tongue and palate mucosa without lesions. Posterior oropharynx clear.  [name] status post tonsillectomy. Neck: No palpable adenopathy or masses Respiratory: Breathing comfortably  Skin: No facial/neck lesions or rash noted.  Cerumen impaction removal  Date/Time: 07/16/2020 1:41 PM Performed by: Rozetta Nunnery, MD Authorized by: Rozetta Nunnery, MD   Consent:    Consent obtained:  Verbal   Consent given by:  Patient   Risks discussed:  Pain and bleeding Procedure details:    Location:  L ear and R  ear   Procedure type: curette   Post-procedure details:    Inspection:  TM intact and canal normal   Hearing quality:  Improved   Patient tolerance of procedure:  Tolerated well, no immediate complications Comments:     Patient with wax buildup in both ear canals but this was nonobstructing and was cleaned with curettes.    Assessment: Chronic rhinitis. Wax buildup.  Plan: Ear canals were cleaned in the office today. Concerning her nasal congestion and recommended regular use of the Flonase 2 sprays each nostril at night and reviewed this with Lular as well as her mother.  No structural abnormalities noted and no signs of infection.   Radene Journey, MD   CC:

## 2020-07-16 NOTE — Progress Notes (Signed)
Office Visit Note   Patient: Deanna Levine           Date of Birth: 2000/03/04           MRN: [id number] Visit Date: 07/16/2020              Requested by: [name], MD [id number] [place],  [name] [address] PCP: [name], MD  Chief Complaint  Patient presents with  . Left Leg - Routine Post Op    04/25/2020 bilateral gastroc recession   . Right Leg - Routine Post Op      HPI: Patient presents today for follow-up on her bilateral Achilles releases.  [name] back at work and has no complaints.  Denies any pain  Assessment & Plan: Visit Diagnoses: No diagnosis found.  Plan: Emphasized that she needs to continue to stretch.  She may follow-up as needed her mother has no concerns  Follow-Up Instructions: No follow-ups on file.   Ortho Exam  Patient is alert, oriented, no adenopathy, well-dressed, normal affect, normal respiratory effort. Bilateral Achilles releases well-healed surgical incisions no tenderness to palpation good plantar flexion dorsiflexion strength bilaterally.  No cellulitis or signs of infection.  [name] to able to dorsiflex past neutral by 10 degrees on both sides  Imaging: No results found. No images are attached to the encounter.  Labs: Lab Results  Component Value Date   HGBA1C 6.2 04/24/2020   HGBA1C 6.6 (A) 12/11/2019   HGBA1C 6.3 (A) 09/20/2019   ESRSEDRATE CANCELED 02/07/2020   ESRSEDRATE 29 (H) 01/18/2020   ESRSEDRATE 33 (H) 10/09/2019   CRP 20.8 (H) 01/18/2020   CRP 26.7 (H) 10/09/2019   LABURIC 6.4 10/09/2019   REPTSTATUS 04/27/2020 FINAL 04/25/2020   CULT  04/25/2020    FEW Normal respiratory flora-no Staph aureus or Pseudomonas seen Performed at [facility], [place] [address], [place], [place] [address]      Lab Results  Component Value Date   ALBUMIN 4.4 03/05/2019   ALBUMIN 4.4 11/15/2018   ALBUMIN 4.6 05/06/2016    No results found for: MG Lab Results  Component Value Date   VD25OH  25.84 (L) 07/02/2020   VD25OH 26 (L) 10/09/2019   VD25OH 30 05/06/2016    No results found for: PREALBUMIN CBC EXTENDED Latest Ref Rng & Units 07/02/2020 04/25/2020 02/07/2020  WBC 4.5 - 10.5 K/uL 9.4 8.5 8.4  RBC 3.87 - 5.11 Mil/uL 5.05 5.10 5.15(H)  HGB 12.0 - 15.0 g/dL 10.5(L) 11.0(L) 10.6(L)  HCT 36.0 - 46.0 % 33.7(L) 35.4(L) 35.1  PLT 150.0 - 400.0 K/uL 319.0 334 379  NEUTROABS 1,500 - 7,800 cells/uL - - 5,527  LYMPHSABS 850 - 3,900 cells/uL - - 2,461     There is no height or weight on file to calculate BMI.  Orders:  No orders of the defined types were placed in this encounter.  No orders of the defined types were placed in this encounter.    Procedures: No procedures performed  Clinical Data: No additional findings.  ROS:  All other systems negative, except as noted in the HPI. Review of Systems  Objective: Vital Signs: There were no vitals taken for this visit.  Specialty Comments:  No specialty comments available.  PMFS History: Patient Active Problem List   Diagnosis Date Noted  . Encounter for general adult medical examination with abnormal findings 07/03/2020  . Leg length discrepancy 06/05/2020  . Hidradenitis suppurativa 06/05/2020  . Intellectual disability 06/05/2020  .  Achilles tendon contracture, bilateral   . Elevated sedimentation rate 02/07/2020  . Pain in right ankle and joints of right foot 02/07/2020  . Anemia 12/29/2018  . Type 2 diabetes mellitus ([place]) 08/15/2017  . Elevated triglycerides with high cholesterol 12/09/2016  . Hydradenitis 09/07/2016  . Partial epilepsy with impairment of consciousness ([place]) 08/16/2012  . Mild intellectual disability 08/16/2012  . Laxity of ligament 08/16/2012  . Delayed milestones 08/16/2012  . Obesity 06/07/2011  . Dyspepsia 02/11/2011  . Acanthosis nigricans 02/11/2011  . Global developmental delay   . Petit mal without grand mal seizures ([place])   . Dyspraxia    Past Medical History:  Diagnosis  Date  . Allergy   . Complication of anesthesia    slow to wake up from anesthesia, occ HA with anesthesia   . Diabetes mellitus without complication ([place])    type 2  . Difficulty swallowing pills   . Dyspraxia   . Global developmental delay   . Obesity (BMI 30-39.9)   . PONV (postoperative nausea and vomiting)   . Precocious female puberty   . Seizures ([place])    last seizure 2012 - no current med    Family History  Problem Relation Age of Onset  . Diabetes Mother        steroid induced; hx. colitis  . Anesthesia problems Mother        severe headache lasting 2-3 days  . Rheum arthritis Mother   . Ulcerative colitis Mother   . Fibromyalgia Mother   . Hypertension Maternal Grandmother   . Hyperlipidemia Maternal Grandmother   . Diabetes Maternal Grandmother   . Kidney disease Maternal Grandfather   . Hypertension Maternal Grandfather   . Heart disease Maternal Grandfather   . Hyperlipidemia Maternal Grandfather   . Kidney disease Paternal Grandmother   . Hypertension Paternal Grandmother   . Diabetes Paternal Grandmother   . Heart disease Paternal Grandmother   . Hyperlipidemia Paternal Grandmother   . Hypertension Paternal Grandfather   . Hyperlipidemia Paternal Grandfather   . Henoch-Schonlein purpura Brother        in remission  . Asthma Maternal Aunt   . Seizures Maternal Uncle     Past Surgical History:  Procedure Laterality Date  . GASTROCNEMIUS RECESSION Bilateral 04/25/2020   Procedure: BILATERAL GASTROCNEMIUS RECESSION;  Surgeon: Newt Minion, MD;  Location: [facility];  Service: Orthopedics;  Laterality: Bilateral;  . [place] IMPLANT  05/06/2011   Procedure: SUPPRELIN IMPLANT;  Surgeon: Jerilynn Mages. Gerald Stabs, MD;  Location: [facility];  Service: Pediatrics;  Laterality: Right;  . SUPPRELIN IMPLANT Right 06/14/2013   Procedure: REMOVAL OF SUPPRELIN IMPLANT FROM RIGHT UPPER ARM;  Surgeon: Jerilynn Mages. Gerald Stabs, MD;  Location: [facility];   Service: Pediatrics;  Laterality: Right;  . TONSILLECTOMY AND ADENOIDECTOMY    . TYMPANOSTOMY TUBE PLACEMENT     x 2   Social History   Occupational History  . 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 Smoking status: Never Smoker  . Smokeless tobacco: Never Used  Vaping Use  . Vaping Use: Never used  Substance and Sexual Activity  . Alcohol use: No    Alcohol/week: 0.0 standard drinks  . Drug use: No  . Sexual activity: Never    Birth control/protection: None

## 2020-07-21 ENCOUNTER — Telehealth (INDEPENDENT_AMBULATORY_CARE_PROVIDER_SITE_OTHER): Payer: Self-pay

## 2020-07-24 ENCOUNTER — Ambulatory Visit (INDEPENDENT_AMBULATORY_CARE_PROVIDER_SITE_OTHER): Payer: 59 | Admitting: Pediatric Endocrinology

## 2020-07-24 ENCOUNTER — Encounter (INDEPENDENT_AMBULATORY_CARE_PROVIDER_SITE_OTHER): Payer: Self-pay | Admitting: Pediatric Endocrinology

## 2020-07-24 ENCOUNTER — Other Ambulatory Visit: Payer: Self-pay

## 2020-07-24 VITALS — BP 110/70 | HR 84 | Wt 215.6 lb

## 2020-07-24 DIAGNOSIS — E1165 Type 2 diabetes mellitus with hyperglycemia: Secondary | ICD-10-CM

## 2020-07-24 DIAGNOSIS — Z794 Long term (current) use of insulin: Secondary | ICD-10-CM | POA: Diagnosis not present

## 2020-07-24 DIAGNOSIS — L83 Acanthosis nigricans: Secondary | ICD-10-CM

## 2020-07-24 LAB — POCT GLUCOSE (DEVICE FOR HOME USE): Glucose Fasting, POC: 145 mg/dL — AB (ref 70–99)

## 2020-07-24 LAB — POCT GLYCOSYLATED HEMOGLOBIN (HGB A1C): Hemoglobin A1C: 6.3 % — AB (ref 4.0–5.6)

## 2020-07-24 MED ORDER — OZEMPIC (0.25 OR 0.5 MG/DOSE) 2 MG/1.5ML ~~LOC~~ SOPN: 0.5000 mg | PEN_INJECTOR | SUBCUTANEOUS | 3 refills | Status: DC

## 2020-07-24 NOTE — Patient Instructions (Signed)
Dexcom restarted in clinic today.   Please schedule a virtual visit with Dr. Lovena Le [facility]) next week to review your Dexcom results.   Please increase your Ozempic to 0.5 mg starting on Tuesday

## 2020-07-24 NOTE — Progress Notes (Signed)
Subjective:  Subjective  Patient Name: Deanna Levine Date of Birth: May 11, 2000  MRN: [id number]  Shamiya Demeritt  presents to clinic today for follow-up evaluation and management of her  obesity, prediabetes, acanthosis   HISTORY OF PRESENT ILLNESS:   Deanna Levine is a 20 y.o. Deanna Levine female   Deanna Levine is accompanied by her dad (lobby)  1. Deanna Levine was initially followed in pediatric endocrine clinic for early puberty complicated by profound developmental delay. [name] now post menarchal and follows in clinic for Type 2 Diabetes and metabolic syndrome.    2. The patient's last [facility] visit was on 04/24/20 .   She had leg surgery on both legs in February. She has been cleared to walk without a walker now. [name] working part time on the weekends at [employer] and [employer].   She has been having issues with her Dexcom falling off. She says that she has been putting in on either her arm or her stomach. She says that even with Skin Tac and an overpatch- it still comes off.   She has not been sugars for the past 3 months.   [name] still using Ozempic 0.25 mg weekly. [name] eating 3 meals a day.   [name] getting ready to go to the beach next week.   She has been drinking water, sugar free twist and gatorade zero. She also drinks some milk.   She has been going to the wound care clinic for the care of her sores under her breasts. She says that they are getting better.   She has not had any further seizure activity and they have not yet redone the 2 day EEG. She says that she also needs a sleep study.   [name] doing physical therapy and helping around the house.   3. Pertinent Review of Systems:  Constitutional: The patient feels "good". The patient seems healthy and active. Eyes: Vision seems to be good. There are no recognized eye problems. Wears glasses.  Neck: The patient has no complaints of anterior neck swelling, soreness, tenderness, pressure, discomfort, or difficulty swallowing.   Heart: Heart  rate increases with exercise or other physical activity. The patient has no complaints of palpitations, irregular heart beats, chest pain, or chest pressure.   Lungs: no asthma or wheezing.  Gastrointestinal: Bowel movents seem normal. The patient has no complaints of excessive hunger, acid reflux, upset stomach, stomach aches or pains, diarrhea. Some constipation.  Legs: Muscle mass and strength seem normal. There are no complaints of numbness, tingling, burning, or pain. No edema is noted.  Feet: Per HPI Neurologic: Has been having staring spells. [name] followed by [name] in adult neurology GYN/GU: Menarche 07/2014, age 66. LMP 07/16/20. [name] no longer having issues with heavy periods. Skin: continued issues with hydradenitis and multiple abscesses- mostly axillary - now also up under her breasts.    Glucose review:  She has not had her Dexcom on since February Annual labs- done with PCP    PAST MEDICAL, FAMILY, AND SOCIAL HISTORY  Past Medical History:  Diagnosis Date  . Allergy   . Complication of anesthesia    slow to wake up from anesthesia, occ HA with anesthesia   . Diabetes mellitus without complication ([place])    type 2  . Difficulty swallowing pills   . Dyspraxia   . Global developmental delay   . Obesity (BMI 30-39.9)   . PONV (postoperative nausea and vomiting)   . Precocious female puberty   .  Seizures ([place])    last seizure 2012 - no current med    Family History  Problem Relation Age of Onset  . Diabetes Mother        steroid induced; hx. colitis  . Anesthesia problems Mother        severe headache lasting 2-3 days  . Rheum arthritis Mother   . Ulcerative colitis Mother   . Fibromyalgia Mother   . Hypertension Maternal Grandmother   . Hyperlipidemia Maternal Grandmother   . Diabetes Maternal Grandmother   . Kidney disease Maternal Grandfather   . Hypertension Maternal Grandfather   . Heart disease Maternal Grandfather   . Hyperlipidemia Maternal  Grandfather   . Kidney disease Paternal Grandmother   . Hypertension Paternal Grandmother   . Diabetes Paternal Grandmother   . Heart disease Paternal Grandmother   . Hyperlipidemia Paternal Grandmother   . Hypertension Paternal Grandfather   . Hyperlipidemia Paternal Grandfather   . Henoch-Schonlein purpura Brother        in remission  . Asthma Maternal Aunt   . Seizures Maternal Uncle      Current Outpatient Medications:  .  ACCU-CHEK GUIDE test strip, USE TO TEST BLOOD SUGAR LEVELS 4 TIMES DAILY, Disp: 150 strip, Rfl: 5 .  Accu-Chek Softclix Lancets lancets, Use to test up to 6x daily, Disp: 200 each, Rfl: 5 .  azelastine (ASTELIN) 0.1 % nasal spray, Place 1 spray into both nostrils 2 (two) times daily. Use in each nostril as directed, Disp: 30 mL, Rfl: 12 .  blood glucose meter kit and supplies, Dispense based on patient and insurance preference. Use up to four times daily as directed. (FOR ICD-10 E10.9, E11.9)., Disp: 1 each, Rfl: 0 .  cetirizine (ZYRTEC) 10 MG tablet, Take 10 mg by mouth daily., Disp: , Rfl:  .  chlorhexidine (HIBICLENS) 4 % external liquid, Apply topically daily as needed., Disp: 120 mL, Rfl: 0 .  cholecalciferol (VITAMIN D3) 25 MCG (1000 UNIT) tablet, Take 1,000 Units by mouth daily., Disp: , Rfl:  .  clindamycin-benzoyl peroxide (BENZACLIN) gel, Apply topically 2 (two) times daily. (Patient taking differently: Apply 1 application topically 2 (two) times daily as needed (acne).), Disp: 25 g, Rfl: 0 .  diclofenac Sodium (VOLTAREN) 1 % GEL, Apply to right foot before work and after work for 60 days., Disp: 350 g, Rfl: 1 .  fluticasone (FLONASE) 50 MCG/ACT nasal spray, Place 2 sprays into both nostrils daily., Disp: 16 g, Rfl: 6 .  Menaquinone-7 (VITAMIN K2) 100 MCG CAPS, Take 100 mcg by mouth daily., Disp: , Rfl:  .  Multiple Vitamins-Minerals (MULTIVITAMIN ADULT) CHEW, Chew 1 tablet by mouth daily., Disp: , Rfl:  .  olopatadine (PATANOL) 0.1 % ophthalmic solution,  Place 1 drop into both eyes 2 (two) times daily as needed for allergies., Disp: , Rfl:  .  Blood Glucose Monitoring Suppl (ONETOUCH VERIO) w/Device KIT, 1 kit by Does not apply route as directed. Use glucometer to check sugar up to 3x per day (E11.9), Disp: 1 kit, Rfl: 3 .  Continuous Blood Gluc Receiver (DEXCOM G6 RECEIVER) DEVI, 1 Device by Does not apply route as directed. (Patient not taking: Reported on 07/24/2020), Disp: 1 each, Rfl: 2 .  Continuous Blood Gluc Sensor (DEXCOM G6 SENSOR) MISC, Inject 1 applicator into the skin as directed. (change sensor every 10 days) (Patient not taking: Reported on 07/24/2020), Disp: 3 each, Rfl: 11 .  Continuous Blood Gluc Transmit (DEXCOM G6 TRANSMITTER) MISC, Inject 1 Device into  the skin as directed. (re-use up to 8x with each new sensor) (Patient not taking: Reported on 07/24/2020), Disp: 1 each, Rfl: 3 .  HYDROcodone-acetaminophen (HYCET) 7.5-325 mg/15 ml solution, Take 15 mLs by mouth every 6 (six) hours as needed for moderate pain. (Patient not taking: Reported on 07/24/2020), Disp: 120 mL, Rfl: 0 .  Semaglutide,0.25 or 0.5MG/DOS, (OZEMPIC, 0.25 OR 0.5 MG/DOSE,) 2 MG/1.5ML SOPN, Inject 0.5 mg into the skin once a week., Disp: 4 each, Rfl: 3 .  triamcinolone (NASACORT) 55 MCG/ACT AERO nasal inhaler, Place 2 sprays into the nose daily as needed (allergies). (Patient not taking: Reported on 07/24/2020), Disp: , Rfl:   Allergies as of 07/24/2020  . (No Known Allergies)     reports that she has never smoked. She has never used smokeless tobacco. She reports that she does not drink alcohol and does not use drugs. Pediatric History  Patient Parents  . Kamyiah, Colantonio (Mother)  . Castillo Jr,Clifton (Father)   Other Topics Concern  . Not on file  Social History Narrative   Daveda is graduated. [name] doing average.    She enjoys playing softball and basketball.   Lives with her parents.    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     Left handed   Two story home      Had surgery on her legs in Feb and is physical therapy twice a week.   Working at [employer] and at [employer]. Mom makes her keep a budget and pay her own bills.   Primary Care Provider: [name], MD     Objective:  Objective   Vital Signs:      04/24/2020  BP 128/59 (A)  Pulse 87  Weight 215 lb 9.8 oz  BMI (Calculated) 36.99    BP 110/70 (BP Location: Right Arm, Patient Position: Sitting, Cuff Size: Large)   Pulse 84   Wt 215 lb 9.6 oz (97.8 kg)   BMI 37.01 kg/m  Growth percentile SmartLinks can only be used for patients less than 8 years old.    Ht Readings from Last 3 Encounters:  07/16/20 _0  (1.626 m)  07/02/20 _1  (1.626 m)  05/26/20 _2  (1.626 m)   Wt Readings from Last 3 Encounters:  07/24/20 215 lb 9.6 oz (97.8 kg)  07/16/20 218 lb (98.9 kg)  07/02/20 217 lb 3.2 oz (98.5 kg)   HC Readings from Last 3 Encounters:  No data found for [facility]   Body surface area is 2.1 meters squared. Facility age limit for growth percentiles is 20 years. Facility age limit for growth percentiles is 20 years.   PHYSICAL EXAM:    Constitutional: The patient appears healthy and well nourished. The patient's height and weight are advanced for age.  Weight is stable since last visit.  Head: The head is normocephalic. Face: The face appears normal. There are no obvious dysmorphic features. Eyes: The eyes appear to be normally formed and spaced. Gaze is conjugate. There is no obvious arcus or proptosis. Moisture appears normal. Ears: The ears are normally placed and appear externally normal. Mouth: The oropharynx and tongue appear normal. Dentition appears to be normal for age. Oral moisture is normal. Neck: The neck appears to be visibly normal. The thyroid gland is 13 grams in size. The consistency of the thyroid gland is normal. The thyroid gland is not tender to palpation. +2 acanthosis with thick scaling Lungs: no increased work of  breathing Heart: regular pulses  and peripheral perfusion Abdomen: The abdomen appears to be enlarged in size for the patient's age. There is no obvious hepatomegaly, splenomegaly, or other mass effect.  Arms: Muscle size and bulk are normal for age. Axillary acanthosis and hydradenitis (scarring) Hands: There is no obvious tremor. Phalangeal and metacarpophalangeal joints are normal. Palmar muscles are normal for age. Palmar skin is normal. Palmar moisture is also normal. Legs: Muscles appear normal for age. No edema is present. Neurologic: Strength is normal for age in both the upper and lower extremities. Muscle tone is normal. Sensation to touch is normal in both the legs and feet.   GYN/GU: normal female Skin: Hydradenitis on axillae, breasts, groin.   LAB DATA:    Lab Results  Component Value Date   HGBA1C 6.3 (A) 07/24/2020   HGBA1C 6.2 04/24/2020   HGBA1C 6.6 (A) 12/11/2019   HGBA1C 6.3 (A) 09/20/2019   HGBA1C >14 05/17/2019   HGBA1C 6.6 (A) 12/29/2018   HGBA1C 6.7 (H) 11/15/2018   HGBA1C 6.5 (A) 02/21/2018     Results for orders placed or performed in visit on 07/24/20  POCT glycosylated hemoglobin (Hb A1C)  Result Value Ref Range   Hemoglobin A1C 6.3 (A) 4.0 - 5.6 %   HbA1c POC (<> result, manual entry)     HbA1c, POC (prediabetic range)     HbA1c, POC (controlled diabetic range)    POCT Glucose (Device for Home Use)  Result Value Ref Range   Glucose Fasting, POC 145 (A) 70 - 99 mg/dL   POC Glucose           Assessment and Plan:  Assessment  ASSESSMENT:   Deanna Levine is a 20 y.o. AA female with history of precocious puberty (which increases risk of T9QZ, PCOS, Metabolic syndrome) who now meets criteria for type 2 diabetes   Type 2 diabetes  - Diagnosed in January 2021 - Has been working closely with Dr. Lovena Le to improve glycemic control - Needs to restart Dexcom- New sensor placed in clinic today - Continues on low dose Ozempic - A1C slightly increased today -  Will increase Ozempic to 0.5 mg weekly  PLAN:    1. Diagnostic: A1C as above  2. Therapeutic: Increase Ozempic to 0.5 mg weekly. Dexcom placed in clinic (right flank) 3. Patient education: Lengthy discussion as above.  4. Follow-up: Return in about 3 months (around 10/24/2020).  Virtual in 1 week to review Dexcom data with Dr. Glori Bickers, MD  Level of Service:  >30 minutes spent today reviewing the medical chart, counseling the patient/family, and documenting today's encounter.  When a patient is on insulin, intensive monitoring of blood glucose levels is necessary to avoid hyperglycemia and hypoglycemia. Severe hyperglycemia/hypoglycemia can lead to hospital admissions and be life threatening.

## 2020-07-30 ENCOUNTER — Other Ambulatory Visit: Payer: Self-pay

## 2020-07-30 ENCOUNTER — Encounter (HOSPITAL_BASED_OUTPATIENT_CLINIC_OR_DEPARTMENT_OTHER): Payer: 59 | Attending: Physician Assistant | Admitting: Physician Assistant

## 2020-07-30 DIAGNOSIS — L732 Hidradenitis suppurativa: Secondary | ICD-10-CM | POA: Insufficient documentation

## 2020-07-30 DIAGNOSIS — E11622 Type 2 diabetes mellitus with other skin ulcer: Secondary | ICD-10-CM | POA: Diagnosis not present

## 2020-07-30 DIAGNOSIS — L98492 Non-pressure chronic ulcer of skin of other sites with fat layer exposed: Secondary | ICD-10-CM | POA: Insufficient documentation

## 2020-07-30 NOTE — Progress Notes (Addendum)
LASHONA, SCHAAF ([id number]) Visit Report for 07/30/2020 Chief Complaint Document Details Patient Name: Date of Service: Deanna Levine, Deanna Levine 07/30/2020 9:00 A M Medical Record Number: [id number] Patient Account Number: [id number] Date of Birth/Sex: Treating RN: 2000-12-26 (20 y.o. Elam Dutch Primary Care Provider: Pricilla Holm Other Clinician: Referring Provider: Treating Provider/Extender: Dreama Saa in Treatment: 8 Information Obtained from: Patient Chief Complaint Hidradenitis Right Breast Electronic Signature(s) Signed: 07/30/2020 9:39:42 AM By: Worthy Keeler PA-C Entered By: Worthy Keeler on 07/30/2020 09:39:42 -------------------------------------------------------------------------------- HPI Details Patient Name: Date of Service: Deanna Levine 07/30/2020 9:00 A M Medical Record Number: [id number] Patient Account Number: [id number] Date of Birth/Sex: Treating RN: 2000/10/12 (20 y.o. Elam Dutch Primary Care Provider: Pricilla Holm Other Clinician: Referring Provider: Treating Provider/Extender: Dreama Saa in Treatment: 8 History of Present Illness HPI Description: 06/04/2020 patient presents today for initial evaluation here in clinic concerning issues she has been having with her under her arms, under the breast, in the suprapubic region, and on her upper back as well with issues which are consistent with hidradenitis though I think it is more on the milder side. Her and her mother have been on top of this trying to keep things under control. Her mother also has issues with hidradenitis though not severely either. This does seem to potentially be something that runs in the family. Nonetheless fortunately there does not appear to be any evidence of significant infection at this point. The wound which is actually under the right breast location actually does not appear to be infected right now  which is great news. With that being said I do see evidence currently of scarring in multiple locations which show that the patient has obviously had issues in the past that several other locations as well. All in the same regions. Nonetheless she does not have a tremendous amount of pain noted at this point which is good news. She does have some mild mental disability according to her mother she does have mild dysphagia as well which requires that she has to either take liquid or crushed antibiotics when she does get those. Her most recent hemoglobin A1c was 6.6. Currently this episode has been going on for about 6 months ago again 3 years as the total length of time she has been dealing with this. She has never seen a hidradenitis clinic. 06/18/20 upon evaluation today patient appears to be doing in my opinion a little better compared to her previous evaluation. There does not appear to be any signs of infection which is great news and overall very pleased with where things stand today. She has not heard from the hidradenitis clinic at [facility] yet although we made that referral last time she was here 2 weeks ago. 5//22 upon evaluation today patient appears to be doing well with regard to her wounds. I do think the Hydrofera Blue has been doing well for her. She has a new area open up on the left/medial chest just inside of her breast. With that being said this does not appear to be too significant at this point which is great news. With that being said she unfortunately is continuing to have new areas show up but again these are minimal and seem to be under fairly good control which is great as well. I do not see any signs of active infection at this point which is great news. She did get a call from [facility] from the hidradenitis clinic.  With that being said she is on a wait list it sounds like at this point. 07/30/2020 upon evaluation today patient appears to be doing well with regard to her wounds for the  most part. There are some areas that have actually healed quite nicely. With that being said she has some regions that do not seem to be healing nearly as effectively at this point. Overall I am pleased with what we are seeing for the most part but at the same time I do feel like that the new areas that pop up or not good she has an area underneath her underarm on the left as well. She has been on doxycycline. She still on the wait list at [facility] she has heard nothing from that according to her mother. In regard to the dermatologist that they have through [facility] that she will be seeing in November they are on a wait list there in case anybody cancels but have not heard anything from that. Electronic Signature(s) Signed: 07/30/2020 9:48:24 AM By: Worthy Keeler PA-C Entered By: Worthy Keeler on 07/30/2020 09:48:23 -------------------------------------------------------------------------------- Physical Exam Details Patient Name: Date of Service: Deanna Levine 07/30/2020 9:00 A M Medical Record Number: [id number] Patient Account Number: [id number] Date of Birth/Sex: Treating RN: 2001/02/25 (20 y.o. Elam Dutch Primary Care Provider: Pricilla Holm Other Clinician: Referring Provider: Treating Provider/Extender: Dreama Saa in Treatment: 8 Constitutional Well-nourished and well-hydrated in no acute distress. Respiratory normal breathing without difficulty. Psychiatric this patient is able to make decisions and demonstrates good insight into disease process. Alert and Oriented x 3. pleasant and cooperative. Notes Upon inspection patient's wound bed actually showed signs of good granulation epithelization at this point. There does not appear to be any evidence of active infection which is great news and overall I am very pleased with where things stand. Unfortunately she does have a new area on the left inferior chest wall below the breast region.  Again this is consistent with a hidradenitis region. Electronic Signature(s) Signed: 07/30/2020 9:49:04 AM By: Worthy Keeler PA-C Entered By: Worthy Keeler on 07/30/2020 09:49:03 -------------------------------------------------------------------------------- Physician Orders Details Patient Name: Date of Service: KAMIYA, ACORD 07/30/2020 9:00 A M Medical Record Number: [id number] Patient Account Number: [id number] Date of Birth/Sex: Treating RN: 10-14-2000 (20 y.o. Elam Dutch Primary Care Provider: Pricilla Holm Other Clinician: Referring Provider: Treating Provider/Extender: Dreama Saa in Treatment: 8 Verbal / Phone Orders: No Diagnosis Coding ICD-10 Coding Code Description L73.2 Hidradenitis suppurativa L98.492 Non-pressure chronic ulcer of skin of other sites with fat layer exposed E11.622 Type 2 diabetes mellitus with other skin ulcer R13.19 Other dysphagia Follow-up Appointments Return appointment in 1 month. Bathing/ Shower/ Hygiene May shower and wash wound with soap and water. Additional Orders / Instructions Other: - call [facility] dermatology to schedule appointment at hidradenitis clinic [phone] Wound Treatment Wound #1 - Breast Wound Laterality: Right, Proximal Prim Dressing: Hydrofera Blue Ready Foam, 4x5 in 1 x Per Day/30 Days ary Discharge Instructions: Apply to wound bed as instructed Secondary Dressing: Bordered Gauze, 2x3.75 in (Generic) 1 x Per Day/30 Days Discharge Instructions: Apply over primary dressing as directed. Wound #4 - Breast Wound Laterality: Left, Inferior Prim Dressing: Hydrofera Blue Ready Foam, 4x5 in 1 x Per Day/30 Days ary Discharge Instructions: Apply to wound bed as instructed Secondary Dressing: Bordered Gauze, 2x3.75 in (Generic) 1 x Per Day/30 Days Discharge Instructions: Apply over primary dressing as directed. Electronic Signature(s) Signed:  07/30/2020 5:35:00 PM By: Worthy Keeler  PA-C Signed: 07/30/2020 6:06:42 PM By: Baruch Gouty RN, BSN Entered By: Baruch Gouty on 07/30/2020 09:44:48 -------------------------------------------------------------------------------- Problem List Details Patient Name: Date of Service: MAKAELYN, APONTE 07/30/2020 9:00 A M Medical Record Number: [id number] Patient Account Number: [id number] Date of Birth/Sex: Treating RN: 2000-04-17 (20 y.o. Elam Dutch Primary Care Provider: Pricilla Holm Other Clinician: Referring Provider: Treating Provider/Extender: Dreama Saa in Treatment: 8 Active Problems ICD-10 Encounter Code Description Active Date MDM Diagnosis L73.2 Hidradenitis suppurativa 06/04/2020 No Yes L98.492 Non-pressure chronic ulcer of skin of other sites with fat layer exposed 06/04/2020 No Yes E11.622 Type 2 diabetes mellitus with other skin ulcer 06/04/2020 No Yes R13.19 Other dysphagia 06/04/2020 No Yes Inactive Problems Resolved Problems Electronic Signature(s) Signed: 07/30/2020 9:39:36 AM By: Worthy Keeler PA-C Entered By: Worthy Keeler on 07/30/2020 09:39:36 -------------------------------------------------------------------------------- Progress Note Details Patient Name: Date of Service: KAYLY, KRIEGEL 07/30/2020 9:00 A M Medical Record Number: [id number] Patient Account Number: [id number] Date of Birth/Sex: Treating RN: January 15, 2001 (20 y.o. Elam Dutch Primary Care Provider: Pricilla Holm Other Clinician: Referring Provider: Treating Provider/Extender: Dreama Saa in Treatment: 8 Subjective Chief Complaint Information obtained from Patient Hidradenitis Right Breast History of Present Illness (HPI) 06/04/2020 patient presents today for initial evaluation here in clinic concerning issues she has been having with her under her arms, under the breast, in the suprapubic region, and on her upper back as well with issues which  are consistent with hidradenitis though I think it is more on the milder side. Her and her mother have been on top of this trying to keep things under control. Her mother also has issues with hidradenitis though not severely either. This does seem to potentially be something that runs in the family. Nonetheless fortunately there does not appear to be any evidence of significant infection at this point. The wound which is actually under the right breast location actually does not appear to be infected right now which is great news. With that being said I do see evidence currently of scarring in multiple locations which show that the patient has obviously had issues in the past that several other locations as well. All in the same regions. Nonetheless she does not have a tremendous amount of pain noted at this point which is good news. She does have some mild mental disability according to her mother she does have mild dysphagia as well which requires that she has to either take liquid or crushed antibiotics when she does get those. Her most recent hemoglobin A1c was 6.6. Currently this episode has been going on for about 6 months ago again 3 years as the total length of time she has been dealing with this. She has never seen a hidradenitis clinic. 06/18/20 upon evaluation today patient appears to be doing in my opinion a little better compared to her previous evaluation. There does not appear to be any signs of infection which is great news and overall very pleased with where things stand today. She has not heard from the hidradenitis clinic at [facility] yet although we made that referral last time she was here 2 weeks ago. 5//22 upon evaluation today patient appears to be doing well with regard to her wounds. I do think the Hydrofera Blue has been doing well for her. She has a new area open up on the left/medial chest just inside of her breast. With that being said this  does not appear to be too significant at  this point which is great news. With that being said she unfortunately is continuing to have new areas show up but again these are minimal and seem to be under fairly good control which is great as well. I do not see any signs of active infection at this point which is great news. She did get a call from [facility] from the hidradenitis clinic. With that being said she is on a wait list it sounds like at this point. 07/30/2020 upon evaluation today patient appears to be doing well with regard to her wounds for the most part. There are some areas that have actually healed quite nicely. With that being said she has some regions that do not seem to be healing nearly as effectively at this point. Overall I am pleased with what we are seeing for the most part but at the same time I do feel like that the new areas that pop up or not good she has an area underneath her underarm on the left as well. She has been on doxycycline. She still on the wait list at [facility] she has heard nothing from that according to her mother. In regard to the dermatologist that they have through [facility] that she will be seeing in November they are on a wait list there in case anybody cancels but have not heard anything from that. Objective Constitutional Well-nourished and well-hydrated in no acute distress. Vitals Time Taken: 9:27 AM, Height: 64 in, Weight: 214 lbs, BMI: 36.7, Temperature: 97.6 F, Pulse: 80 bpm, Respiratory Rate: 16 breaths/min, Blood Pressure: 111/76 mmHg, Capillary Blood Glucose: 120 mg/dl. General Notes: glucose per pt report Respiratory normal breathing without difficulty. Psychiatric this patient is able to make decisions and demonstrates good insight into disease process. Alert and Oriented x 3. pleasant and cooperative. General Notes: Upon inspection patient's wound bed actually showed signs of good granulation epithelization at this point. There does not appear to be any evidence of active infection which is  great news and overall I am very pleased with where things stand. Unfortunately she does have a new area on the left inferior chest wall below the breast region. Again this is consistent with a hidradenitis region. Integumentary (Hair, Skin) Wound #1 status is Open. Original cause of wound was Gradually Appeared. The date acquired was: 11/30/2019. The wound has been in treatment 8 weeks. The wound is located on the Right,Proximal Breast. The wound measures 0.2cm length x 0.2cm width x 0.1cm depth; 0.031cm^2 area and 0.003cm^3 volume. There is Fat Layer (Subcutaneous Tissue) exposed. There is no tunneling or undermining noted. There is a medium amount of serosanguineous drainage noted. The wound margin is distinct with the outline attached to the wound base. There is large (67-100%) red granulation within the wound bed. There is no necrotic tissue within the wound bed. Wound #3 status is Healed - Epithelialized. Original cause of wound was Gradually Appeared. The date acquired was: 06/29/2020. The wound has been in treatment 4 weeks. The wound is located on the Left,Medial Breast. The wound measures 0cm length x 0cm width x 0cm depth; 0cm^2 area and 0cm^3 volume. There is no tunneling or undermining noted. There is a none present amount of drainage noted. The wound margin is flat and intact. There is no granulation within the wound bed. There is no necrotic tissue within the wound bed. Wound #4 status is Open. Original cause of wound was Gradually Appeared. The date acquired was: 07/30/2020.  The wound is located on the Left,Inferior Breast. The wound measures 0.4cm length x 0.4cm width x 0.1cm depth; 0.126cm^2 area and 0.013cm^3 volume. There is Fat Layer (Subcutaneous Tissue) exposed. There is no tunneling or undermining noted. There is a small amount of serosanguineous drainage noted. The wound margin is flat and intact. There is large (67- 100%) pink, pale granulation within the wound bed. There is no  necrotic tissue within the wound bed. Assessment Active Problems ICD-10 Hidradenitis suppurativa Non-pressure chronic ulcer of skin of other sites with fat layer exposed Type 2 diabetes mellitus with other skin ulcer Other dysphagia Plan Follow-up Appointments: Return appointment in 1 month. Bathing/ Shower/ Hygiene: May shower and wash wound with soap and water. Additional Orders / Instructions: Other: - call [facility] dermatology to schedule appointment at hidradenitis clinic [phone] WOUND #1: - Breast Wound Laterality: Right, Proximal Prim Dressing: Hydrofera Blue Ready Foam, 4x5 in 1 x Per Day/30 Days ary Discharge Instructions: Apply to wound bed as instructed Secondary Dressing: Bordered Gauze, 2x3.75 in (Generic) 1 x Per Day/30 Days Discharge Instructions: Apply over primary dressing as directed. WOUND #4: - Breast Wound Laterality: Left, Inferior Prim Dressing: Hydrofera Blue Ready Foam, 4x5 in 1 x Per Day/30 Days ary Discharge Instructions: Apply to wound bed as instructed Secondary Dressing: Bordered Gauze, 2x3.75 in (Generic) 1 x Per Day/30 Days Discharge Instructions: Apply over primary dressing as directed. 1. Would recommend currently that we actually go ahead and continue with the [facility] as I feel like that has been beneficial for her. The patient is in agreement with plan as is her mother. 2. I am also can recommend at this time that we have the patient go ahead and continue as well to monitor for any signs of worsening with regard to infection if anything occurs they should let me know soon as possible. 3. I am going to suggest as well that they continue to await on the wait list for [facility] as well as her current appointment in November to see if there is anything sooner they can get into with regard to the hidradenitis clinic. Obviously I think this is going to be a difficult thing as there really are not many openings it seems. We will see patient back for  reevaluation in 1 Month here in the clinic. If anything worsens or changes patient will contact our office for additional recommendations. Electronic Signature(s) Signed: 07/30/2020 9:50:07 AM By: Worthy Keeler PA-C Entered By: Worthy Keeler on 07/30/2020 09:50:06 -------------------------------------------------------------------------------- SuperBill Details Patient Name: Date of Service: GEORGIANNA, BAND 07/30/2020 Medical Record Number: [id number] Patient Account Number: [id number] Date of Birth/Sex: Treating RN: 2000-09-29 (20 y.o. Elam Dutch Primary Care Provider: Pricilla Holm Other Clinician: Referring Provider: Treating Provider/Extender: Dreama Saa in Treatment: 8 Diagnosis Coding ICD-10 Codes Code Description L73.2 Hidradenitis suppurativa L98.492 Non-pressure chronic ulcer of skin of other sites with fat layer exposed E11.622 Type 2 diabetes mellitus with other skin ulcer R13.19 Other dysphagia Facility Procedures CPT4 Code: [id number] Description: 99214 - WOUND CARE VISIT-LEV 4 EST PT Modifier: Quantity: 1 Physician Procedures : CPT4 Code Description Modifier [id number] - WC PHYS LEVEL 3 - EST PT ICD-10 Diagnosis Description L73.2 Hidradenitis suppurativa L98.492 Non-pressure chronic ulcer of skin of other sites with fat layer exposed E11.622 Type 2 diabetes mellitus with  other skin ulcer R13.19 Other dysphagia Quantity: 1 Electronic Signature(s) Signed: 07/30/2020 9:50:21 AM By: Worthy Keeler PA-C Entered By: Worthy Keeler on  07/30/2020 09:50:21 

## 2020-07-31 ENCOUNTER — Telehealth (INDEPENDENT_AMBULATORY_CARE_PROVIDER_SITE_OTHER): Payer: 59 | Admitting: Pharmacist

## 2020-07-31 ENCOUNTER — Encounter (INDEPENDENT_AMBULATORY_CARE_PROVIDER_SITE_OTHER): Payer: Self-pay

## 2020-07-31 DIAGNOSIS — E11628 Type 2 diabetes mellitus with other skin complications: Secondary | ICD-10-CM

## 2020-07-31 NOTE — Progress Notes (Addendum)
This is a Pediatric Specialist E-Visit (My Chart Video Visit) follow up consult provided via WebEx Deanna Levine consented to an E-Visit consult today.  Location of patient: Deanna Levine is at home  Location of provider: Drexel Iha, PharmD, CPP, CDCES is at office.   S:     Chief Complaint  Patient presents with   Diabetes    Medication Management    Endocrinology provider: Dr. [name] (upcoming appt 10/27/20 8:15 AM)  Patient referred to me by Dr. [name] for closer DM management. 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  At recent appt with Dr. [name] on 07/24/20, patient was not wearing Dexcom as she was having issues with Dexcom sensor adhesion. Dr. [name] provided her a sample of Dexcom sensor/transmitter. Patient's A1c had slightly increased from 6.2 (Feb 2022) --> 6.3 (May 2022). Dr. [name] increased Ozempic 0.25 mg subQ once weekly --> 0.5 mg subQ once weekly. She advised patient to f/u with myself to discuss Dexcom adhesion concerns and assist with titrating Ozempic.   I connected with Driscilla Grammes on 07/31/20 by video and verified that I am speaking with the correct person using two identifiers. She states she has been well. She recently had surgery on both of her legs and had a speedy recovery. [name] back to walking as normal. She states she has been having issues with Dexcom adhesion - particularly when she sweats it comes off. She has tried wearing Dexcom sensors in her stomach, legs, back, and arms. It falls no matter the location she wears Dexcom. She uses Skin Tac. She reports she has increased dose of Ozempic on from 0.25 --> 0.5 mg on 07/29/20. No GI upset. Patient stated she is  taking both Nasacort and Flonase for allergies. Patient also admitted taking Nasacort and Flonase at the same time.   School: not in school  Occupation: [employer] (Friday and Sunday) 3PM-8PM and [employer] (Friday, Saturday) 8AM-12PM  Diabetes Diagnosis: 03/2019  Family History: mother (T2DM), father (T2DM)  Patient-Reported BG Readings:  -Patient denies hypoglycemic events.  --Treats hypoglycemic episode with jolly ranchers  --Hypoglycemic symptoms: fatigue  Insurance Coverage: [facility] [place], [place] [facility] [place]  [address] [place] [address]  Phone:  [phone]  Fax:  [phone]  DEA #:  [id number]  Medication Adherence -Patient reports adherence with medications.  -Current diabetes medications include: Ozempic 0.5 mg subQ once weekly (incresaed from 0.25 --> 0.5 mg Tuesdays on 5/31) -Prior diabetes medications include: metformin (switched to Victoza), Trulicity (painful), Humalog/Novolog (correction), Victoza (switched to Ozempic), Lantus (able to taper off when patient initiated on Ozempic)  Injection Sites  (no changes since prior pharmacy appt on 04/24/2020) -Patient reports injection sites are abdomen, outer legs, back of arms, top of buttocks --Patient reports independently injecting DM medications. --Patient reports rotating injection sites   Diet  (changes since prior pharmacy appt on 04/24/2020) Patient reported dietary habits:  Eats 2 meals/day and 0 snacks/day Breakfast (9AM):  skipping breakfast  Lunch (skips); (2-3 pm) baked chicken, fries; chicken, mashed potatoes, macaroni and cheese, green beans Dinner (9PM): salad (croutons, [place], lettuce with ranch), peas Snacks:  glucerna  occasionally, peanut butter crackers  Drinks:water, sugar free gatorade, sugar free orange twist, less milk/lactaid  Exercise (changes since prior pharmacy appt on  04/24/2020) Patient-reported exercise habits: physical therapy 2x/week for 1 hour, home exercises (push ups, jumping jacks, runs; exercises daily for 1 hr)   Monitoring: Patient denies episodes of nocturia (nighttime urination) each night.  Patient denies neuropathy (nerve pain)  Patient denies visual changes. (Followed by ophthalmology (last seen 11/01/2019; has upcoming appt 10/2020 with Dr. Annamaria Boots)) Patient reports self foot exams; no open wounds/cuts on her feet.   O:  Dexcom G6 Report    Labs:     There were no vitals filed for this visit.  Lab Results  Component Value Date   HGBA1C 6.3 (A) 07/24/2020   HGBA1C 6.2 04/24/2020   HGBA1C 6.6 (A) 12/11/2019    Lab Results  Component Value Date   CPEPTIDE 3.08 08/15/2017       Component Value Date/Time   CHOL 152 11/15/2018 1154   TRIG 63.0 11/15/2018 1154   HDL 38.20 (L) 11/15/2018 1154   CHOLHDL 4 11/15/2018 1154   VLDL 12.6 11/15/2018 1154   LDLCALC 101 (H) 11/15/2018 1154   LDLCALC 104 08/15/2017 0000    Lab Results  Component Value Date   MICRALBCREAT 0.9 07/02/2020    Assessment: Medication Management - TIR at goal > 70%. No hypoglycemia. Encouraged patient for her success as well as continuing to meet A1c goal <7%!!!! Since Ozempic was recently increased 0.25 mg --> 0.5 mg on 5/31 will wait 1 month to re-assess BG readings and determine if patient would benefit from additional increase in Ozempic. It is important to keep in mind patient's BG was tightly controlled at higher doses of Ozempic so she may not tolerate dosage increase. Continue wearing Dexcom G6 CGM. Follow up in 1 month.  Dexcom sensor adhesion issues - Patient confirms she has tried all appropriate site placement locations (no site works better than others), she is using Skin tac --> applying Dexcom --> applying more Skin tac on adhesive --> applying Dexcom overpatch. Advised her to try Dexcom overpatch arm band or grifgirps (sent instructions on  purchasing via Mychart). Contact me if she continues to have issues with adhesion. F/u in 1 month.  Dual allergy medications - Advised patient to take EITHER Nasacort or Flonase. She should not take both since they are in the same medication class.    Plan: Medications: Continue Ozempic 0.5 mg subQ once weekly  Dexcom issues Try using Dexcom overpatch arm band or grifgrips (sent instructions on purchasing via Mychart) Monitoring:  Continue using Dexcom G6 CGM (does NOT require PA via her insurance) Carry Weesner has a diagnosis of diabetes, checks blood glucose readings > 4x per day, and requires frequent adjustments to insulin regimen. This patient will be seen every six months, minimally, to assess adherence to their CGM regimen and diabetes treatment plan.  Dual allergy medications - Advised patient to take EITHER Nasacort or Flonase. She should not take both since they are in the same medication class.  Follow Up: 1 month  This appointment required 30 minutes of patient care (this includes precharting, chart review, review of results,  virtual care, etc.).  Thank you for involving clinical pharmacist/diabetes educator to assist in providing this patient's care.  Drexel Iha, PharmD, CPP, CDCES

## 2020-08-17 NOTE — Progress Notes (Addendum)
S:     Chief Complaint  Patient presents with   Diabetes    Medication Management      Endocrinology provider: Dr. [name] (upcoming appt 10/27/20 8:15 AM)  Patient referred to me by Dr. [name] for closer DM management. 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  At recent appt with Dr. [name] on 07/24/20, patient was not wearing Dexcom as she was having issues with Dexcom sensor adhesion. Dr. [name] provided her a sample of Dexcom sensor/transmitter. Patient's A1c had slightly increased from 6.2 (Feb 2022) --> 6.3 (May 2022). Dr. [name] increased Ozempic 0.25 mg subQ once weekly --> 0.5 mg subQ once weekly. She advised patient to f/u with myself to discuss Dexcom adhesion concerns and assist with titrating Ozempic.  At recent appt with myself on 07/31/20, patient's Dexcom Clarity report showed TIR 87%, high 12%, very high <1%. Encouraged patient for her success as well as continuing to meet A1c goal <7%!!!! Since Ozempic was recently increased 0.25 mg --> 0.5 mg on 5/31 will wait 1 month to re-assess BG readings and determine if patient would benefit from additional increase in Ozempic. It is important to keep in mind patient's BG was tightly controlled at higher doses of Ozempic so she may not tolerate dosage increase. Patient was also having issues with Dexcom sensor adhesion. Patient confirmed she has tried all appropriate site placement locations (no site works better than others), she is using Skin tac --> applying Dexcom --> applying more Skin tac on adhesive --> applying Dexcom overpatch. Advised her to try Dexcom overpatch arm band or grifgirps (sent instructions on purchasing via Mychart). Advised her  to contact me if she continues to have issues with adhesion.   Patient presents today for follow up appt. [name] still having issues with Dexcom adhesion - her Dexcom actually fell off on 07/29/20 - she has not worn her Dexcom since then. She reports sometimes checking her BG reading with manual fingerstick; reports they ~130 mg/dL when she wakes up and 30 min after she eats BG reading are ~190 mg/dL.  She denies any s/sx of ypoglycemia. She has not tried using dexcom overpatch arm band; states her mother is looking into this. She has tried grifgrips - she states these do not work. She recently started working at [employer] - yesterday was her first day. She will be pushing carts around outside in the heat. She likes working at [employer]. Her last day at [employer] will be at Friday.   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 --Treats hypoglycemic episode with jolly ranchers  --Hypoglycemic symptoms: fatigue  Insurance Coverage: [facility] [phone] - [place], [place] AT [place]  [address] [place], [place] [address]  Phone:  [phone]  Fax:  [phone]  DEA #:  [id number]  Medication Adherence -Patient reports adherence with medications.  -Current diabetes medications include: Ozempic 0.5 mg subQ  once weekly (increased from 0.25 --> 0.5 mg Tuesdays on 5/31) -Prior diabetes medications include: metformin (switched to Victoza), Trulicity (painful), Humalog/Novolog (correction), Victoza (switched to Ozempic), Lantus (able to taper off when patient initiated on Ozempic)  Injection Sites  (no changes since prior  pharmacy appt on 07/31/2020) -Patient reports injection sites are abdomen, outer legs, back of arms, top of buttocks --Patient reports independently injecting DM medications. --Patient reports rotating injection sites   Diet  (changes since prior pharmacy appt on 07/31/2020) Patient reported dietary habits:  Eats 3 meals/day and 0 snacks/day Breakfast (9AM):  sausage and eggs (eating BF now, used to skip BF) Lunch (2-3 pm) baked chicken, fries; chicken, mashed potatoes, macaroni and cheese, green beans Dinner (9PM): salad (croutons, [place], lettuce with ranch), peas Snacks:  glucerna occasionally, peanut butter crackers  Drinks:water, sugar free gatorade, sugar free orange twist, less milk/lactaid  Exercise changes since prior pharmacy appt on 07/31/2020) Patient-reported exercise habits: reports has not scheduled further appt for physical therapy, home exercises (push ups, jumping jacks, runs; exercises daily for 1 hr), 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 (Followed by ophthalmology (last seen 11/01/2019; has upcoming appt 10/2020 with Dr. Annamaria Boots)) Patient reports self foot exams; no open wounds/cuts on her feet.   O:  Dexcom G6 Report - unable to review   Labs:     There were no vitals filed for this visit.  Lab Results  Component Value Date   HGBA1C 6.3 (A) 07/24/2020   HGBA1C 6.2 04/24/2020   HGBA1C 6.6 (A) 12/11/2019    Lab Results  Component Value Date   CPEPTIDE 3.08 08/15/2017       Component Value Date/Time   CHOL 152 11/15/2018 1154   TRIG 63.0 11/15/2018 1154   HDL 38.20 (L) 11/15/2018 1154   CHOLHDL 4 11/15/2018 1154   VLDL 12.6 11/15/2018 1154   LDLCALC 101 (H) 11/15/2018 1154   LDLCALC 104 08/15/2017 0000    Lab Results  Component Value Date   MICRALBCREAT 0.9 07/02/2020     Assessment: Medication Management - Unable to review Dexcom report as she continues to experience issues with Dexcom adhesion. Most BG readings appear to be stable in 100-200 mg/dL range per pt report. Patient also has denied s/sx of hypoglycemia. Considering most recent A1c was 6.3% and patient will be increasing physical activity (recently started job at [employer] as cart pusher; will be pushing carts around in the heat between 11AM-8PM) so will continue Ozempic at 0.5 mg subQ once weekly for now. Will have patient monitor BG readings with onetouch meter 2-3x/week and rotate between monitoring fasting BG and 2 hour post prandial BG readings after dinner considering she has tried multiple different efforts to assist with Dexcom adhesion without success. She also will be working in the heat at [employer] most days which will increase her likelihood to sweat which will further complicate Dexcom sensor adhesion. Follow up about 1.5 months in between Dr. [name] appt (11/2020).    Plan: Medications: Continue Ozempic 0.5 mg subQ once weekly  Monitoring:  Monitor BG readings with onetouch meter 2-3x/week and rotate between monitoring fasting BG and 2 hour post prandial BG readings after dinner  Deanna Levine has a diagnosis of diabetes, checks blood glucose readings > 4x per day, and requires frequent adjustments to insulin regimen. This patient will be seen every six months,  minimally, to assess adherence to their CGM regimen and diabetes treatment plan.  Follow Up: 11/2020 (half way in between Dr. [name] appt)  This appointment required (30 minutes of patient care (this includes precharting, chart review, review of results,  in person care, etc.).  Thank you for involving clinical pharmacist/diabetes educator to assist in providing this patient's care.  Drexel Iha, PharmD, CPP, CDCES     I have reviewed the following documentation and am in agreeance with the plan. I was immediately available to  the clinical pharmacist for questions and collaboration.  Lelon Huh, MD

## 2020-08-20 ENCOUNTER — Ambulatory Visit (INDEPENDENT_AMBULATORY_CARE_PROVIDER_SITE_OTHER): Payer: 59 | Admitting: Allergy

## 2020-08-20 ENCOUNTER — Encounter: Payer: Self-pay | Admitting: Allergy

## 2020-08-20 ENCOUNTER — Other Ambulatory Visit: Payer: Self-pay

## 2020-08-20 VITALS — BP 118/82 | HR 83 | Temp 97.9°F | Resp 16 | Ht 64.0 in | Wt 217.4 lb

## 2020-08-20 DIAGNOSIS — T781XXD Other adverse food reactions, not elsewhere classified, subsequent encounter: Secondary | ICD-10-CM | POA: Insufficient documentation

## 2020-08-20 DIAGNOSIS — H1013 Acute atopic conjunctivitis, bilateral: Secondary | ICD-10-CM | POA: Diagnosis not present

## 2020-08-20 DIAGNOSIS — J3089 Other allergic rhinitis: Secondary | ICD-10-CM | POA: Insufficient documentation

## 2020-08-20 MED ORDER — LEVOCETIRIZINE DIHYDROCHLORIDE 5 MG PO TABS: 5.0000 mg | ORAL_TABLET | Freq: Every evening | ORAL | 5 refills | Status: DC

## 2020-08-20 NOTE — Patient Instructions (Addendum)
Today's skin testing showed: Positive to grass, weed pollen, ragweed, trees, cat, dog, cockroach and dust mites.  Negative to milk and casein.  Environmental allergies Start environmental control measures as below. Use over the counter antihistamines such as Zyrtec (cetirizine), Claritin (loratadine), Allegra (fexofenadine), or Xyzal (levocetirizine) daily as needed. May take twice a day during allergy flares. May switch antihistamines every few months. Use Flonase (fluticasone) nasal spray 1 spray per nostril twice a day as needed for nasal congestion.  Use azelastine nasal spray 1-2 sprays per nostril twice a day as needed for runny nose/drainage. Nasal saline spray (i.e., Simply Saline) or nasal saline lavage (i.e., NeilMed) is recommended as needed and prior to medicated nasal sprays.  Consider allergy injections for long term control if above medications do not help the symptoms - handout given.   Food: Lactose intolerance: May use lactose free milk or take a lactaid pill right before consuming anything with dairy. I don't think you are allergic to milk protein as you have been eating cheese with no issues.   Follow up in 6 months or sooner if needed.   Reducing Pollen Exposure Pollen seasons: trees (spring), grass (summer) and ragweed/weeds (fall). Keep windows closed in your home and car to lower pollen exposure.  Install air conditioning in the bedroom and throughout the house if possible.  Avoid going out in dry windy days - especially early morning. Pollen counts are highest between 5 - 10 AM and on dry, hot and windy days.  Save outside activities for late afternoon or after a heavy rain, when pollen levels are lower.  Avoid mowing of grass if you have grass pollen allergy. Be aware that pollen can also be transported indoors on people and pets.  Dry your clothes in an automatic dryer rather than hanging them outside where they might collect pollen.  Rinse hair and eyes before  bedtime.  Pet Allergen Avoidance: Contrary to popular opinion, there are no "hypoallergenic" breeds of dogs or cats. That is because people are not allergic to an animal's hair, but to an allergen found in the animal's saliva, dander (dead skin flakes) or urine. Pet allergy symptoms typically occur within minutes. For some people, symptoms can build up and become most severe 8 to 12 hours after contact with the animal. People with severe allergies can experience reactions in public places if dander has been transported on the pet owners' clothing. Keeping an animal outdoors is only a partial solution, since homes with pets in the yard still have higher concentrations of animal allergens. Before getting a pet, ask your allergist to determine if you are allergic to animals. If your pet is already considered part of your family, try to minimize contact and keep the pet out of the bedroom and other rooms where you spend a great deal of time. As with dust mites, vacuum carpets often or replace carpet with a hardwood floor, tile or linoleum. High-efficiency particulate air (HEPA) cleaners can reduce allergen levels over time. While dander and saliva are the source of cat and dog allergens, urine is the source of allergens from rabbits, hamsters, mice and [place] pigs; so ask a non-allergic family member to clean the animal's cage. If you have a pet allergy, talk to your allergist about the potential for allergy immunotherapy (allergy shots). This strategy can often provide long-term relief.  Cockroach Allergen Avoidance Cockroaches are often found in the homes of densely populated urban areas, schools or commercial buildings, but these creatures can lurk almost  anywhere. This does not mean that you have a dirty house or living area. Block all areas where roaches can enter the home. This includes crevices, wall cracks and windows.  Cockroaches need water to survive, so fix and seal all leaky faucets and pipes.  Have an exterminator go through the house when your family and pets are gone to eliminate any remaining roaches. Keep food in lidded containers and put pet food dishes away after your pets are done eating. Vacuum and sweep the floor after meals, and take out garbage and recyclables. Use lidded garbage containers in the kitchen. Wash dishes immediately after use and clean under stoves, refrigerators or toasters where crumbs can accumulate. Wipe off the stove and other kitchen surfaces and cupboards regularly.  Control of House Dust Mite Allergen Dust mite allergens are a common trigger of allergy and asthma symptoms. While they can be found throughout the house, these microscopic creatures thrive in warm, humid environments such as bedding, upholstered furniture and carpeting. Because so much time is spent in the bedroom, it is essential to reduce mite levels there.  Encase pillows, mattresses, and box springs in special allergen-proof fabric covers or airtight, zippered plastic covers.  Bedding should be washed weekly in hot water (130 F) and dried in a hot dryer. Allergen-proof covers are available for comforters and pillows that can't be regularly washed.  Wash the allergy-proof covers every few months. Minimize clutter in the bedroom. Keep pets out of the bedroom.  Keep humidity less than 50% by using a dehumidifier or air conditioning. You can buy a humidity measuring device called a hygrometer to monitor this.  If possible, replace carpets with hardwood, linoleum, or washable area rugs. If that's not possible, vacuum frequently with a vacuum that has a HEPA filter. Remove all upholstered furniture and non-washable window drapes from the bedroom. Remove all non-washable stuffed toys from the bedroom.  Wash stuffed toys weekly.

## 2020-08-20 NOTE — Assessment & Plan Note (Signed)
Patient concerned about dairy allergy as she had an episode of 3 days of diarrhea after consuming 2 cups of Lactaid milk.  Since then she had cheese with no issues.  Today's skin prick testing was negative to milk and casein.  Lactose intolerance: May use lactose free milk or take a Lactaid pill right before consuming anything with dairy.

## 2020-08-20 NOTE — Progress Notes (Signed)
New Patient Note  RE: Deanna Levine MRN: [id number] DOB: 2000/10/10 Date of Office Visit: 08/20/2020  Consult requested by: [name], * Primary care provider: [name], MD  Chief Complaint: Food Intolerance (Diarrhea with cheese, also milk messes with her stomach)  History of Present Illness: I had the pleasure of seeing [name] for initial evaluation at the [facility] [place] [facility] on 08/20/2020. [name] a 20 y.o. female, who is referred here by [name], MD for the evaluation of allergic rhinitis and milk sensitivity.  [name] accompanied today by her father who provided/contributed to the history.   Rhinitis:  She reports symptoms of nasal congestion, rhinorrhea, sneezing, itchy/watery eyes. Symptoms have been going on for 1 year. The symptoms are present mainly in the spring and summer. Other triggers include exposure to unknown. Anosmia: no. Headache: sometimes. She has used zyrtec, Flonase, Astelin inconsistently with no improvement in symptoms. Sinus infections: no. Previous work up includes: none. Previous ENT evaluation: yes - chronic rhinitis Previous sinus imaging: no. History of nasal polyps: no. Last eye exam: 2020. History of reflux: no.  Food: She reports food allergy to dairy. The reaction started in April 2022, after she had 2 cups of lactaid milk - one in the morning and one at night. Symptoms started the following day and was in the form of diarrhea. Denies any hives, swelling, wheezing, abdominal pain, vomiting. No sick contacts and mother had same milk product with no issues. Denies any associated cofactors such as exertion, infection, NSAID use, or alcohol consumption. The symptoms lasted for 3 days.  Past work up includes: none.  Dietary History: patient has been eating other foods including limited cheese products, eggs, peanut, treenuts, sesame, shellfish, fish, soy, wheat, meats, fruits and vegetables.  No  recent tick bites. Tolerates red meat with no issues.   Assessment and Plan: [name] is a 20 y.o. female with: Other allergic rhinitis Rhinoconjunctivitis symptoms mainly in the spring and summer.  Tried Zyrtec, Flonase and Astelin with no benefit.  ENT evaluation showed chronic rhinitis.  No prior allergy testing. Today's skin testing showed: Positive to grass, weed pollen, ragweed, trees, cat, dog, cockroach and dust mites.  Start environmental control measures as below. Use over the counter antihistamines such as Zyrtec (cetirizine), Claritin (loratadine), Allegra (fexofenadine), or Xyzal (levocetirizine) daily as needed. May take twice a day during allergy flares. May switch antihistamines every few months. Use Flonase (fluticasone) nasal spray 1 spray per nostril twice a day as needed for nasal congestion.  Use azelastine nasal spray 1-2 sprays per nostril twice a day as needed for runny nose/drainage. Nasal saline spray (i.e., Simply Saline) or nasal saline lavage (i.e., NeilMed) is recommended as needed and prior to medicated nasal sprays. Consider allergy injections for long term control if above medications do not help the symptoms - handout given.   Allergic conjunctivitis of both eyes See assessment and plan as above.  Other adverse food reactions, not elsewhere classified, subsequent encounter Patient concerned about dairy allergy as she had an episode of 3 days of diarrhea after consuming 2 cups of Lactaid milk.  Since then she had cheese with no issues. Today's skin prick testing was negative to milk and casein. Lactose intolerance: May use lactose free milk or take a Lactaid pill right before consuming anything with dairy.  Return in about 4 months (around 12/20/2020).  Meds ordered this encounter  Medications   levocetirizine (XYZAL) 5 MG tablet  Sig: Take 1 tablet (5 mg total) by mouth every evening.    Dispense:  30 tablet    Refill:  5    Lab Orders  No laboratory  test(s) ordered today    Other allergy screening: Asthma: no Medication allergy: no Hymenoptera allergy: no Urticaria: no Eczema:yes History of recurrent infections suggestive of immunodeficency: no  Diagnostics: Skin Testing: Environmental allergy panel and select foods. Positive to grass, weed pollen, ragweed, trees, cat, dog, cockroach and dust mites.  Negative to milk and casein. Results discussed with patient/family.  Airborne Adult Perc - 08/20/20 0925     Time Antigen Placed 7014    Allergen Manufacturer Lavella Hammock    Location Back    Number of Test 59    1. Control-Buffer 50% Glycerol Negative    2. Control-Histamine 1 mg/ml 2+    3. Albumin saline Negative    4. [place] 4+    5. [place] 4+    6. [place] 4+    7. [place] 4+    8. Meadow Fescue 2+    9. Perennial Rye 3+    10. Sweet Vernal 2+    11. Timothy 3+    12. Cocklebur Negative    13. Burweed Marshelder --   +/-   14. Ragweed, short --   +/-   18. Sheep Sorrell 2+    19. Rough Pigweed --   +/-   20. Marsh Elder, Rough Negative    21. Mugwort, Common 2+    22. Ash mix 4+    23. Birch mix 4+    24. Beech American 4+    25. Box, Elder 4+    26. Cedar, red 4+    27. Cottonwood, Eastern 4+    28. Elm mix 4+    29. Hickory 4+    30. Maple mix 2+    31. Oak, [place] mix Negative    32. Pecan Pollen 2+    33. Pine mix Negative    34. Sycamore Eastern 2+    35. Nichols, Black Pollen 2+    36. Alternaria alternata Negative    37. Cladosporium Herbarum Negative    38. Aspergillus mix Negative    39. Penicillium mix Negative    40. Bipolaris sorokiniana (Helminthosporium) Negative    41. Drechslera spicifera (Curvularia) Negative    42. Mucor plumbeus Negative    43. Fusarium moniliforme Negative    44. Aureobasidium pullulans (pullulara) Negative    45. Rhizopus oryzae Negative    46. Botrytis cinera Negative    47. Epicoccum nigrum Negative    48. Phoma betae Negative    49. Candida Albicans  Negative    50. Trichophyton mentagrophytes Negative    51. Mite, D Farinae  5,000 AU/ml Negative    52. Mite, D Pteronyssinus  5,000 AU/ml Negative    53. Cat Hair 10,000 BAU/ml Negative    54.  Dog Epithelia Negative    55. Mixed Feathers Negative    56. Horse Epithelia Negative    57. Cockroach, German Negative    58. Mouse Negative    59. Tobacco Leaf Negative             Intradermal - 08/20/20 0959     Time Antigen Placed 1000    Allergen Manufacturer Lavella Hammock    Location Arm    Number of Test 9    Control Negative    Mold 1 Negative    Mold 2 Negative  Mold 3 Negative    Mold 4 Negative    Cat 3+    Dog 3+    Cockroach 2+    Mite mix 3+             Food Adult Perc - 08/20/20 0900     Time Antigen Placed 5956    Allergen Manufacturer Lavella Hammock    Location Back    Number of allergen test 3    Control-Histamine 1 mg/ml 2+    5. Milk, cow Negative    7. Casein Negative             Past Medical History: Patient Active Problem List   Diagnosis Date Noted   Other adverse food reactions, not elsewhere classified, subsequent encounter 08/20/2020   Other allergic rhinitis 08/20/2020   Allergic conjunctivitis of both eyes 08/20/2020   Encounter for general adult medical examination with abnormal findings 07/03/2020   Leg length discrepancy 06/05/2020   Hidradenitis suppurativa 06/05/2020   Intellectual disability 06/05/2020   Achilles tendon contracture, bilateral    Elevated sedimentation rate 02/07/2020   Pain in right ankle and joints of right foot 02/07/2020   Anemia 12/29/2018   Type 2 diabetes mellitus ([place]) 08/15/2017   Elevated triglycerides with high cholesterol 12/09/2016   Hydradenitis 09/07/2016   Partial epilepsy with impairment of consciousness ([place]) 08/16/2012   Mild intellectual disability 08/16/2012   Laxity of ligament 08/16/2012   Delayed milestones 08/16/2012   Obesity 06/07/2011   Dyspepsia 02/11/2011   Acanthosis nigricans  02/11/2011   Global developmental delay    Petit mal without grand mal seizures ([place])    Dyspraxia    Past Medical History:  Diagnosis Date   Allergy    Complication of anesthesia    slow to wake up from anesthesia, occ HA with anesthesia    Diabetes mellitus without complication ([place])    type 2   Difficulty swallowing pills    Dyspraxia    Eczema    Global developmental delay    Obesity (BMI 30-39.9)    PONV (postoperative nausea and vomiting)    Precocious female puberty    Seizures ([place])    last seizure 2012 - no current med   Past Surgical History: Past Surgical History:  Procedure Laterality Date   ADENOIDECTOMY     GASTROCNEMIUS RECESSION Bilateral 04/25/2020   Procedure: BILATERAL GASTROCNEMIUS RECESSION;  Surgeon: Newt Minion, MD;  Location: [facility];  Service: Orthopedics;  Laterality: Bilateral;   SUPPRELIN IMPLANT  05/06/2011   Procedure: SUPPRELIN IMPLANT;  Surgeon: Jerilynn Mages. Gerald Stabs, MD;  Location: [facility];  Service: Pediatrics;  Laterality: Right;   SUPPRELIN IMPLANT Right 06/14/2013   Procedure: REMOVAL OF SUPPRELIN IMPLANT FROM RIGHT UPPER ARM;  Surgeon: Jerilynn Mages. Gerald Stabs, MD;  Location: [facility];  Service: Pediatrics;  Laterality: Right;   TONSILLECTOMY     TONSILLECTOMY AND ADENOIDECTOMY     TYMPANOSTOMY TUBE PLACEMENT     x 2   Medication List:  Current Outpatient Medications  Medication Sig Dispense Refill   ACCU-CHEK GUIDE test strip USE TO TEST BLOOD SUGAR LEVELS 4 TIMES DAILY 150 strip 5   Accu-Chek Softclix Lancets lancets Use to test up to 6x daily 200 each 5   azelastine (ASTELIN) 0.1 % nasal spray Place 1 spray into both nostrils 2 (two) times daily. Use in each nostril as directed 30 mL 12   blood glucose meter kit and supplies Dispense based on patient and insurance  preference. Use up to four times daily as directed. (FOR ICD-10 E10.9, E11.9). 1 each 0   Blood Glucose Monitoring Suppl (ONETOUCH VERIO)  w/Device KIT 1 kit by Does not apply route as directed. Use glucometer to check sugar up to 3x per day (E11.9) 1 kit 3   cetirizine (ZYRTEC) 10 MG tablet Take 10 mg by mouth as needed.     chlorhexidine (HIBICLENS) 4 % external liquid Apply topically daily as needed. 120 mL 0   cholecalciferol (VITAMIN D3) 25 MCG (1000 UNIT) tablet Take 1,000 Units by mouth daily.     Continuous Blood Gluc Receiver (DEXCOM G6 RECEIVER) DEVI 1 Device by Does not apply route as directed. 1 each 2   diclofenac Sodium (VOLTAREN) 1 % GEL Apply to right foot before work and after work for 60 days. 350 g 1   fluticasone (FLONASE) 50 MCG/ACT nasal spray Place 2 sprays into both nostrils daily. 16 g 6   levocetirizine (XYZAL) 5 MG tablet Take 1 tablet (5 mg total) by mouth every evening. 30 tablet 5   Menaquinone-7 (VITAMIN K2) 100 MCG CAPS Take 100 mcg by mouth daily.     Multiple Vitamins-Minerals (MULTIVITAMIN ADULT) CHEW Chew 1 tablet by mouth daily.     olopatadine (PATANOL) 0.1 % ophthalmic solution Place 1 drop into both eyes 2 (two) times daily as needed for allergies.     Semaglutide,0.25 or 0.5MG/DOS, (OZEMPIC, 0.25 OR 0.5 MG/DOSE,) 2 MG/1.5ML SOPN Inject 0.5 mg into the skin once a week. 4 each 3   triamcinolone (NASACORT) 55 MCG/ACT AERO nasal inhaler Place 2 sprays into the nose daily as needed (allergies).     No current facility-administered medications for this visit.   Allergies: No Known Allergies Social History: Social History   Socioeconomic History   Marital status: Single    Spouse name: Not on file   Number of children: 12   Years of education: Not on file   Highest education level: Not on file  Occupational History   Occupation: [employer]  Tobacco Use   Smoking status: Never   Smokeless tobacco: Never  Vaping Use   Vaping Use: Never used  Substance and Sexual Activity   Alcohol use: No    Alcohol/week: 0.0 standard drinks   Drug use: No   Sexual activity: Never    Birth  control/protection: None  Other Topics Concern   Not on file  Social History Narrative   Ryan is graduated. [name] doing average.    She enjoys playing softball and basketball.   Lives with her parents.    Volunteers Monday- Thursday at [employer] .    Left handed   Two story home      Had surgery on her legs in Feb and is physical therapy twice a week.   Social Determinants of Health   Financial Resource Strain: Not on file  Food Insecurity: Not on file  Transportation Needs: Not on file  Physical Activity: Not on file  Stress: Not on file  Social Connections: Not on file   Lives in a 20 year old house. Smoking: denies Occupation: Warden/ranger HistoryFreight forwarder in the house: no Carpet in the family room: no Carpet in the bedroom: yes Heating: electric Cooling: central Pet: no  Family History: Family History  Problem Relation Age of Onset   Diabetes Mother        steroid induced; hx. colitis   Anesthesia problems Mother  severe headache lasting 2-3 days   Rheum arthritis Mother    Ulcerative colitis Mother    Fibromyalgia Mother    Hypertension Maternal Grandmother    Hyperlipidemia Maternal Grandmother    Diabetes Maternal Grandmother    Kidney disease Maternal Grandfather    Hypertension Maternal Grandfather    Heart disease Maternal Grandfather    Hyperlipidemia Maternal Grandfather    Kidney disease Paternal Grandmother    Hypertension Paternal Grandmother    Diabetes Paternal Grandmother    Heart disease Paternal Grandmother    Hyperlipidemia Paternal Grandmother    Hypertension Paternal Grandfather    Hyperlipidemia Paternal Grandfather    Henoch-Schonlein purpura Brother        in remission   Asthma Maternal Aunt    Seizures Maternal Uncle    Review of Systems  Constitutional:  Negative for appetite change, chills, fever and unexpected weight change.  HENT:  Positive for congestion, rhinorrhea  and sneezing.   Eyes:  Positive for itching.  Respiratory:  Negative for cough, chest tightness, shortness of breath and wheezing.   Cardiovascular:  Negative for chest pain.  Gastrointestinal:  Negative for abdominal pain.  Genitourinary:  Negative for difficulty urinating.  Skin:  Negative for rash.  Allergic/Immunologic: Positive for environmental allergies.  Neurological:  Negative for headaches.   Objective: BP 118/82   Pulse 83   Temp 97.9 F (36.6 C) (Temporal)   Resp 16   Ht _0  (1.626 m)   Wt 217 lb 6.4 oz (98.6 kg)   SpO2 97%   BMI 37.32 kg/m  Body mass index is 37.32 kg/m. Physical Exam Vitals and nursing note reviewed.  Constitutional:      Appearance: Normal appearance. [name] well-developed.  HENT:     Head: Normocephalic and atraumatic.     Right Ear: Tympanic membrane and external ear normal.     Left Ear: External ear normal. There is impacted cerumen.     Nose: Nose normal.     Mouth/Throat:     Mouth: Mucous membranes are moist.     Pharynx: Oropharynx is clear.  Eyes:     Conjunctiva/sclera: Conjunctivae normal.  Cardiovascular:     Rate and Rhythm: Normal rate and regular rhythm.     Heart sounds: Normal heart sounds. No murmur heard.   No friction rub. No gallop.  Pulmonary:     Effort: Pulmonary effort is normal.     Breath sounds: Normal breath sounds. No wheezing, rhonchi or rales.  Musculoskeletal:     Cervical back: Neck supple.  Skin:    General: Skin is warm.     Findings: No rash.  Neurological:     Mental Status: [name] alert and oriented to person, place, and time.  Psychiatric:        Behavior: Behavior normal.  The plan was reviewed with the patient/family, and all questions/concerned were addressed.  It was my pleasure to see Zyasia today and participate in her care. Please feel free to contact me with any questions or concerns.  Sincerely,  Rexene Alberts, DO Allergy & Immunology  [facility] office: [place] office: [phone]

## 2020-08-20 NOTE — Assessment & Plan Note (Signed)
.   See assessment and plan as above. 

## 2020-08-20 NOTE — Assessment & Plan Note (Signed)
Rhinoconjunctivitis symptoms mainly in the spring and summer.  Tried Zyrtec, Flonase and Astelin with no benefit.  ENT evaluation showed chronic rhinitis.  No prior allergy testing.  Today's skin testing showed: Positive to grass, weed pollen, ragweed, trees, cat, dog, cockroach and dust mites.   Start environmental control measures as below.  Use over the counter antihistamines such as Zyrtec (cetirizine), Claritin (loratadine), Allegra (fexofenadine), or Xyzal (levocetirizine) daily as needed. May take twice a day during allergy flares. May switch antihistamines every few months.  Use Flonase (fluticasone) nasal spray 1 spray per nostril twice a day as needed for nasal congestion.   Use azelastine nasal spray 1-2 sprays per nostril twice a day as needed for runny nose/drainage.  Nasal saline spray (i.e., Simply Saline) or nasal saline lavage (i.e., NeilMed) is recommended as needed and prior to medicated nasal sprays.  Consider allergy injections for long term control if above medications do not help the symptoms - handout given.

## 2020-08-22 ENCOUNTER — Ambulatory Visit: Payer: 59 | Admitting: Podiatry

## 2020-08-26 ENCOUNTER — Ambulatory Visit (INDEPENDENT_AMBULATORY_CARE_PROVIDER_SITE_OTHER): Payer: 59 | Admitting: Pharmacist

## 2020-08-26 ENCOUNTER — Other Ambulatory Visit: Payer: Self-pay

## 2020-08-26 VITALS — Wt 218.2 lb

## 2020-08-26 DIAGNOSIS — E1165 Type 2 diabetes mellitus with hyperglycemia: Secondary | ICD-10-CM

## 2020-08-26 DIAGNOSIS — Z794 Long term (current) use of insulin: Secondary | ICD-10-CM

## 2020-08-26 DIAGNOSIS — E11628 Type 2 diabetes mellitus with other skin complications: Secondary | ICD-10-CM

## 2020-08-26 LAB — POCT GLUCOSE (DEVICE FOR HOME USE): POC Glucose: 196 mg/dl — AB (ref 70–99)

## 2020-08-26 MED ORDER — OZEMPIC (0.25 OR 0.5 MG/DOSE) 2 MG/1.5ML ~~LOC~~ SOPN: 0.5000 mg | PEN_INJECTOR | SUBCUTANEOUS | 3 refills | Status: DC

## 2020-08-26 NOTE — Patient Instructions (Signed)
It was a pleasure seeing you today!  Today the plan is.. Check blood sugar with Onetouch meter ~3x week. Change between checking in the morning BEFORE you have ate any food to checking 2 hours after dinner.   Please contact me (Dr. Lovena Le) at [phone] or via [place] with any questions/concerns

## 2020-08-27 ENCOUNTER — Encounter (HOSPITAL_BASED_OUTPATIENT_CLINIC_OR_DEPARTMENT_OTHER): Payer: 59 | Admitting: Physician Assistant

## 2020-09-02 ENCOUNTER — Encounter (INDEPENDENT_AMBULATORY_CARE_PROVIDER_SITE_OTHER): Payer: Self-pay | Admitting: Psychology

## 2020-09-03 ENCOUNTER — Other Ambulatory Visit: Payer: Self-pay

## 2020-09-03 ENCOUNTER — Encounter (HOSPITAL_BASED_OUTPATIENT_CLINIC_OR_DEPARTMENT_OTHER): Payer: 59 | Attending: Physician Assistant | Admitting: Physician Assistant

## 2020-09-03 DIAGNOSIS — L732 Hidradenitis suppurativa: Secondary | ICD-10-CM | POA: Insufficient documentation

## 2020-09-03 DIAGNOSIS — L98492 Non-pressure chronic ulcer of skin of other sites with fat layer exposed: Secondary | ICD-10-CM | POA: Diagnosis not present

## 2020-09-03 DIAGNOSIS — E1151 Type 2 diabetes mellitus with diabetic peripheral angiopathy without gangrene: Secondary | ICD-10-CM | POA: Insufficient documentation

## 2020-09-03 DIAGNOSIS — E11622 Type 2 diabetes mellitus with other skin ulcer: Secondary | ICD-10-CM | POA: Diagnosis present

## 2020-09-03 DIAGNOSIS — G40909 Epilepsy, unspecified, not intractable, without status epilepticus: Secondary | ICD-10-CM | POA: Insufficient documentation

## 2020-09-03 NOTE — Progress Notes (Addendum)
Deanna Levine, Deanna Levine ([id number]) Visit Report for 09/03/2020 Chief Complaint Document Details Patient Name: Date of Service: Deanna Levine, Deanna Levine 09/03/2020 8:15 A M Medical Record Number: [id number] Patient Account Number: [id number] Date of Birth/Sex: Treating RN: November 23, 2000 (20 y.o. Elam Dutch Primary Care Provider: Pricilla Holm Other Clinician: Referring Provider: Treating Provider/Extender: Dreama Saa in Treatment: 13 Information Obtained from: Patient Chief Complaint Hidradenitis Right Breast Electronic Signature(s) Signed: 09/03/2020 8:41:47 AM By: Worthy Keeler PA-C Entered By: Worthy Keeler on 09/03/2020 08:41:47 -------------------------------------------------------------------------------- HPI Details Patient Name: Date of Service: Deanna Levine, Deanna Levine 09/03/2020 8:15 A M Medical Record Number: [id number] Patient Account Number: [id number] Date of Birth/Sex: Treating RN: 2000-04-10 (20 y.o. Elam Dutch Primary Care Provider: Pricilla Holm Other Clinician: Referring Provider: Treating Provider/Extender: Dreama Saa in Treatment: 13 History of Present Illness HPI Description: 06/04/2020 patient presents today for initial evaluation here in clinic concerning issues she has been having with her under her arms, under the breast, in the suprapubic region, and on her upper back as well with issues which are consistent with hidradenitis though I think it is more on the milder side. Her and her mother have been on top of this trying to keep things under control. Her mother also has issues with hidradenitis though not severely either. This does seem to potentially be something that runs in the family. Nonetheless fortunately there does not appear to be any evidence of significant infection at this point. The wound which is actually under the right breast location actually does not appear to be infected right  now which is great news. With that being said I do see evidence currently of scarring in multiple locations which show that the patient has obviously had issues in the past that several other locations as well. All in the same regions. Nonetheless she does not have a tremendous amount of pain noted at this point which is good news. She does have some mild mental disability according to her mother she does have mild dysphagia as well which requires that she has to either take liquid or crushed antibiotics when she does get those. Her most recent hemoglobin A1c was 6.6. Currently this episode has been going on for about 6 months ago again 3 years as the total length of time she has been dealing with this. She has never seen a hidradenitis clinic. 06/18/20 upon evaluation today patient appears to be doing in my opinion a little better compared to her previous evaluation. There does not appear to be any signs of infection which is great news and overall very pleased with where things stand today. She has not heard from the hidradenitis clinic at [facility] yet although we made that referral last time she was here 2 weeks ago. 5//22 upon evaluation today patient appears to be doing well with regard to her wounds. I do think the Hydrofera Blue has been doing well for her. She has a new area open up on the left/medial chest just inside of her breast. With that being said this does not appear to be too significant at this point which is great news. With that being said she unfortunately is continuing to have new areas show up but again these are minimal and seem to be under fairly good control which is great as well. I do not see any signs of active infection at this point which is great news. She did get a call from [facility] from the hidradenitis clinic.  With that being said she is on a wait list it sounds like at this point. 07/30/2020 upon evaluation today patient appears to be doing well with regard to her wounds for the  most part. There are some areas that have actually healed quite nicely. With that being said she has some regions that do not seem to be healing nearly as effectively at this point. Overall I am pleased with what we are seeing for the most part but at the same time I do feel like that the new areas that pop up or not good she has an area underneath her underarm on the left as well. She has been on doxycycline. She still on the wait list at [facility] she has heard nothing from that according to her mother. In regard to the dermatologist that they have through [facility] that she will be seeing in November they are on a wait list there in case anybody cancels but have not heard anything from that. 09/03/2020 upon evaluation today patient appears to be doing well with regard to her the hidradenitis areas underneath the breast location. She has been tolerating the dressing changes without complication with been using Hydrofera Blue which is doing a great job. She does not have any open areas under the underarms either at this point. Everything seems to be doing quite well which is great. Electronic Signature(s) Signed: 09/03/2020 9:24:30 AM By: Worthy Keeler PA-C Entered By: Worthy Keeler on 09/03/2020 09:24:30 -------------------------------------------------------------------------------- Physical Exam Details Patient Name: Date of Service: Deanna Levine, Deanna Levine 09/03/2020 8:15 A M Medical Record Number: [id number] Patient Account Number: [id number] Date of Birth/Sex: Treating RN: 09-11-2000 (20 y.o. Elam Dutch Primary Care Provider: Pricilla Holm Other Clinician: Referring Provider: Treating Provider/Extender: Dreama Saa in Treatment: 71 Constitutional Well-nourished and well-hydrated in no acute distress. Respiratory normal breathing without difficulty. Psychiatric this patient is able to make decisions and demonstrates good insight into disease process. Alert  and Oriented x 3. pleasant and cooperative. Notes Upon inspection patient's wounds again are showing signs of complete epithelization except from the left breast where she still has an open area but again this does not appear to be doing poorly at all. I am very pleased with where things are standing at this point. The patient likewise is very happy she is not having a whole lot of pain although this 1 area does hurt currently. She did jump a little bit when I was carefully cleaning it with saline and gauze. Electronic Signature(s) Signed: 09/03/2020 9:25:01 AM By: Worthy Keeler PA-C Entered By: Worthy Keeler on 09/03/2020 09:25:00 -------------------------------------------------------------------------------- Physician Orders Details Patient Name: Date of Service: Deanna Levine, Deanna Levine 09/03/2020 8:15 A M Medical Record Number: [id number] Patient Account Number: [id number] Date of Birth/Sex: Treating RN: Jul 13, 2000 (20 y.o. Elam Dutch Primary Care Provider: Pricilla Holm Other Clinician: Referring Provider: Treating Provider/Extender: Dreama Saa in Treatment: [phone] Verbal / Phone Orders: No Diagnosis Coding ICD-10 Coding Code Description L73.2 Hidradenitis suppurativa L98.492 Non-pressure chronic ulcer of skin of other sites with fat layer exposed E11.622 Type 2 diabetes mellitus with other skin ulcer R13.19 Other dysphagia Follow-up Appointments Return appointment in 1 month. Bathing/ Shower/ Hygiene May shower and wash wound with soap and water. Additional Orders / Instructions Other: - call [facility] dermatology to schedule appointment at hidradenitis clinic [phone] Wound Treatment Wound #4 - Breast Wound Laterality: Left, Inferior Prim Dressing: Hydrofera Blue Ready Foam, 4x5 in 1 x  Per Day/30 Days ary Discharge Instructions: Apply to wound bed as instructed Secondary Dressing: Bordered Gauze, 2x3.75 in (Generic) 1 x Per Day/30  Days Discharge Instructions: Apply over primary dressing as directed. Electronic Signature(s) Signed: 09/03/2020 4:43:01 PM By: Worthy Keeler PA-C Signed: 09/03/2020 6:17:15 PM By: Baruch Gouty RN, BSN Entered By: Baruch Gouty on 09/03/2020 09:19:12 -------------------------------------------------------------------------------- Problem List Details Patient Name: Date of Service: Deanna Levine, Deanna Levine 09/03/2020 8:15 A M Medical Record Number: [id number] Patient Account Number: [id number] Date of Birth/Sex: Treating RN: February 13, 2001 (20 y.o. Elam Dutch Primary Care Provider: Pricilla Holm Other Clinician: Referring Provider: Treating Provider/Extender: Dreama Saa in Treatment: 13 Active Problems ICD-10 Encounter Code Description Active Date MDM Diagnosis L73.2 Hidradenitis suppurativa 06/04/2020 No Yes L98.492 Non-pressure chronic ulcer of skin of other sites with fat layer exposed 06/04/2020 No Yes E11.622 Type 2 diabetes mellitus with other skin ulcer 06/04/2020 No Yes R13.19 Other dysphagia 06/04/2020 No Yes Inactive Problems Resolved Problems Electronic Signature(s) Signed: 09/03/2020 8:41:41 AM By: Worthy Keeler PA-C Entered By: Worthy Keeler on 09/03/2020 08:41:41 -------------------------------------------------------------------------------- Progress Note Details Patient Name: Date of Service: Deanna Levine, Deanna Levine 09/03/2020 8:15 A M Medical Record Number: [id number] Patient Account Number: [id number] Date of Birth/Sex: Treating RN: December 16, 2000 (20 y.o. Elam Dutch Primary Care Provider: Pricilla Holm Other Clinician: Referring Provider: Treating Provider/Extender: Dreama Saa in Treatment: 13 Subjective Chief Complaint Information obtained from Patient Hidradenitis Right Breast History of Present Illness (HPI) 06/04/2020 patient presents today for initial evaluation here in clinic concerning  issues she has been having with her under her arms, under the breast, in the suprapubic region, and on her upper back as well with issues which are consistent with hidradenitis though I think it is more on the milder side. Her and her mother have been on top of this trying to keep things under control. Her mother also has issues with hidradenitis though not severely either. This does seem to potentially be something that runs in the family. Nonetheless fortunately there does not appear to be any evidence of significant infection at this point. The wound which is actually under the right breast location actually does not appear to be infected right now which is great news. With that being said I do see evidence currently of scarring in multiple locations which show that the patient has obviously had issues in the past that several other locations as well. All in the same regions. Nonetheless she does not have a tremendous amount of pain noted at this point which is good news. She does have some mild mental disability according to her mother she does have mild dysphagia as well which requires that she has to either take liquid or crushed antibiotics when she does get those. Her most recent hemoglobin A1c was 6.6. Currently this episode has been going on for about 6 months ago again 3 years as the total length of time she has been dealing with this. She has never seen a hidradenitis clinic. 06/18/20 upon evaluation today patient appears to be doing in my opinion a little better compared to her previous evaluation. There does not appear to be any signs of infection which is great news and overall very pleased with where things stand today. She has not heard from the hidradenitis clinic at [facility] yet although we made that referral last time she was here 2 weeks ago. 5//22 upon evaluation today patient appears to be doing well with regard to  her wounds. I do think the Hydrofera Blue has been doing well for her.  She has a new area open up on the left/medial chest just inside of her breast. With that being said this does not appear to be too significant at this point which is great news. With that being said she unfortunately is continuing to have new areas show up but again these are minimal and seem to be under fairly good control which is great as well. I do not see any signs of active infection at this point which is great news. She did get a call from [facility] from the hidradenitis clinic. With that being said she is on a wait list it sounds like at this point. 07/30/2020 upon evaluation today patient appears to be doing well with regard to her wounds for the most part. There are some areas that have actually healed quite nicely. With that being said she has some regions that do not seem to be healing nearly as effectively at this point. Overall I am pleased with what we are seeing for the most part but at the same time I do feel like that the new areas that pop up or not good she has an area underneath her underarm on the left as well. She has been on doxycycline. She still on the wait list at [facility] she has heard nothing from that according to her mother. In regard to the dermatologist that they have through [facility] that she will be seeing in November they are on a wait list there in case anybody cancels but have not heard anything from that. 09/03/2020 upon evaluation today patient appears to be doing well with regard to her the hidradenitis areas underneath the breast location. She has been tolerating the dressing changes without complication with been using Hydrofera Blue which is doing a great job. She does not have any open areas under the underarms either at this point. Everything seems to be doing quite well which is great. Objective Constitutional Well-nourished and well-hydrated in no acute distress. Vitals Time Taken: 8:34 AM, Height: 64 in, Source: Stated, Weight: 214 lbs, Source: Stated, BMI: 36.7,  Temperature: 97.8 F, Pulse: 76 bpm, Respiratory Rate: 18 breaths/min, Blood Pressure: 105/72 mmHg, Capillary Blood Glucose: 130 mg/dl. General Notes: glucose per pt report this am Respiratory normal breathing without difficulty. Psychiatric this patient is able to make decisions and demonstrates good insight into disease process. Alert and Oriented x 3. pleasant and cooperative. General Notes: Upon inspection patient's wounds again are showing signs of complete epithelization except from the left breast where she still has an open area but again this does not appear to be doing poorly at all. I am very pleased with where things are standing at this point. The patient likewise is very happy she is not having a whole lot of pain although this 1 area does hurt currently. She did jump a little bit when I was carefully cleaning it with saline and gauze. Integumentary (Hair, Skin) Wound #1 status is Healed - Epithelialized. Original cause of wound was Gradually Appeared. The date acquired was: 11/30/2019. The wound has been in treatment 13 weeks. The wound is located on the Right,Proximal Breast. The wound measures 0cm length x 0cm width x 0cm depth; 0cm^2 area and 0cm^3 volume. There is no tunneling or undermining noted. There is a none present amount of drainage noted. There is no granulation within the wound bed. There is no necrotic tissue within the wound bed. Wound #4  status is Open. Original cause of wound was Gradually Appeared. The date acquired was: 07/30/2020. The wound has been in treatment 5 weeks. The wound is located on the Left,Inferior Breast. The wound measures 0.5cm length x 0.7cm width x 0.4cm depth; 0.275cm^2 area and 0.11cm^3 volume. There is Fat Layer (Subcutaneous Tissue) exposed. There is no tunneling or undermining noted. There is a medium amount of serosanguineous drainage noted. The wound margin is flat and intact. There is large (67-100%) red granulation within the wound bed.  There is no necrotic tissue within the wound bed. Assessment Active Problems ICD-10 Hidradenitis suppurativa Non-pressure chronic ulcer of skin of other sites with fat layer exposed Type 2 diabetes mellitus with other skin ulcer Other dysphagia Plan Follow-up Appointments: Return appointment in 1 month. Bathing/ Shower/ Hygiene: May shower and wash wound with soap and water. Additional Orders / Instructions: Other: - call [facility] dermatology to schedule appointment at hidradenitis clinic [phone] WOUND #4: - Breast Wound Laterality: Left, Inferior Prim Dressing: Hydrofera Blue Ready Foam, 4x5 in 1 x Per Day/30 Days ary Discharge Instructions: Apply to wound bed as instructed Secondary Dressing: Bordered Gauze, 2x3.75 in (Generic) 1 x Per Day/30 Days Discharge Instructions: Apply over primary dressing as directed. 1. Would recommend currently that we going continue with the wound care measures as before and the patient is in agreement with plan this includes the use of the [facility] dressing which I think is doing a great job for the patient. 2. I am also going to recommend that we have the patient continue to monitor for any signs of worsening from the standpoint of infection right now do not see anything that seems to be a significant issue at this point. 3. The patient still does have her appointment with the local dermatologist coming up the end of this year. In the meantime we will try to do what we can to get these areas healed I still think either way it would be good to see them to discuss options for trying to prevent this from continuing to be an ongoing issue for her. We will see patient back for reevaluation in 1 week here in the clinic. If anything worsens or changes patient will contact our office for additional recommendations. Electronic Signature(s) Signed: 09/03/2020 9:25:58 AM By: Worthy Keeler PA-C Entered By: Worthy Keeler on 09/03/2020  09:25:58 -------------------------------------------------------------------------------- SuperBill Details Patient Name: Date of Service: Deanna Levine, Deanna Levine 09/03/2020 Medical Record Number: [id number] Patient Account Number: [id number] Date of Birth/Sex: Treating RN: 07-09-2000 (20 y.o. Elam Dutch Primary Care Provider: Pricilla Holm Other Clinician: Referring Provider: Treating Provider/Extender: Dreama Saa in Treatment: 13 Diagnosis Coding ICD-10 Codes Code Description L73.2 Hidradenitis suppurativa L98.492 Non-pressure chronic ulcer of skin of other sites with fat layer exposed E11.622 Type 2 diabetes mellitus with other skin ulcer R13.19 Other dysphagia Facility Procedures CPT4 Code: [id number] Description: 99213 - WOUND CARE VISIT-LEV 3 EST PT Modifier: Quantity: 1 Physician Procedures : CPT4 Code Description Modifier [id number] - WC PHYS LEVEL 3 - EST PT ICD-10 Diagnosis Description L73.2 Hidradenitis suppurativa L98.492 Non-pressure chronic ulcer of skin of other sites with fat layer exposed E11.622 Type 2 diabetes mellitus with  other skin ulcer R13.19 Other dysphagia Quantity: 1 Electronic Signature(s) Signed: 09/03/2020 9:26:18 AM By: Worthy Keeler PA-C Entered By: Worthy Keeler on 09/03/2020 09:26:18

## 2020-09-03 NOTE — Progress Notes (Signed)
ELZA, VARRICCHIO ([id number]) Visit Report for 09/03/2020 Arrival Information Details Patient Name: Date of Service: Deanna Levine, Deanna Levine 09/03/2020 8:15 A M Medical Record Number: [id number] Patient Account Number: [id number] Date of Birth/Sex: Treating RN: 12-13-2000 (20 y.o. Elam Dutch Primary Care Pattye Meda: Pricilla Holm Other Clinician: Referring Tyrihanna Wingert: Treating Ambree Frances/Extender: Dreama Saa in Treatment: 13 Visit Information History Since Last Visit Any new allergies or adverse reactions: No Patient Arrived: Ambulatory Had a fall or experienced change in No Arrival Time: 08:32 activities of daily living that may affect Accompanied By: mother risk of falls: Transfer Assistance: None Signs or symptoms of abuse/neglect since last visito No Patient Identification Verified: Yes Hospitalized since last visit: No Secondary Verification Process Completed: Yes Implantable device outside of the clinic excluding No Patient Requires Transmission-Based Precautions: No cellular tissue based products placed in the center Patient Has Alerts: No since last visit: Has Dressing in Place as Prescribed: Yes Pain Present Now: No Electronic Signature(s) Signed: 09/03/2020 6:17:15 PM By: Baruch Gouty RN, BSN Entered By: Baruch Gouty on 09/03/2020 08:32:48 -------------------------------------------------------------------------------- Clinic Level of Care Assessment Details Patient Name: Date of Service: Deanna Levine, Deanna Levine 09/03/2020 8:15 A M Medical Record Number: [id number] Patient Account Number: [id number] Date of Birth/Sex: Treating RN: Jun 13, 2000 (20 y.o. Elam Dutch Primary Care Lindell Tussey: Pricilla Holm Other Clinician: Referring Dusti Tetro: Treating Mersadez Linden/Extender: Dreama Saa in Treatment: 13 Clinic Level of Care Assessment Items TOOL 4 Quantity Score []  - 0 Use when only an EandM is performed on  FOLLOW-UP visit ASSESSMENTS - Nursing Assessment / Reassessment X- 1 10 Reassessment of Co-morbidities (includes updates in patient status) X- 1 5 Reassessment of Adherence to Treatment Plan ASSESSMENTS - Wound and Skin A ssessment / Reassessment X - Simple Wound Assessment / Reassessment - one wound 1 5 []  - 0 Complex Wound Assessment / Reassessment - multiple wounds []  - 0 Dermatologic / Skin Assessment (not related to wound area) ASSESSMENTS - Focused Assessment []  - 0 Circumferential Edema Measurements - multi extremities []  - 0 Nutritional Assessment / Counseling / Intervention []  - 0 Lower Extremity Assessment (monofilament, tuning fork, pulses) []  - 0 Peripheral Arterial Disease Assessment (using hand held doppler) ASSESSMENTS - Ostomy and/or Continence Assessment and Care []  - 0 Incontinence Assessment and Management []  - 0 Ostomy Care Assessment and Management (repouching, etc.) PROCESS - Coordination of Care X - Simple Patient / Family Education for ongoing care 1 15 []  - 0 Complex (extensive) Patient / Family Education for ongoing care X- 1 10 Staff obtains Programmer, systems, Records, T Results / Process Orders est []  - 0 Staff telephones HHA, Nursing Homes / Clarify orders / etc []  - 0 Routine Transfer to another Facility (non-emergent condition) []  - 0 Routine Hospital Admission (non-emergent condition) []  - 0 New Admissions / Biomedical engineer / Ordering NPWT Apligraf, etc. , []  - 0 Emergency Hospital Admission (emergent condition) X- 1 10 Simple Discharge Coordination []  - 0 Complex (extensive) Discharge Coordination PROCESS - Special Needs []  - 0 Pediatric / Minor Patient Management []  - 0 Isolation Patient Management []  - 0 Hearing / Language / Visual special needs []  - 0 Assessment of Community assistance (transportation, D/C planning, etc.) []  - 0 Additional assistance / Altered mentation []  - 0 Support Surface(s) Assessment (bed, cushion,  seat, etc.) INTERVENTIONS - Wound Cleansing / Measurement X - Simple Wound Cleansing - one wound 1 5 []  - 0 Complex Wound Cleansing - multiple wounds X- 1 5 Wound Imaging (  photographs - any number of wounds) []  - 0 Wound Tracing (instead of photographs) X- 1 5 Simple Wound Measurement - one wound []  - 0 Complex Wound Measurement - multiple wounds INTERVENTIONS - Wound Dressings X - Small Wound Dressing one or multiple wounds 1 10 []  - 0 Medium Wound Dressing one or multiple wounds []  - 0 Large Wound Dressing one or multiple wounds X- 1 5 Application of Medications - topical []  - 0 Application of Medications - injection INTERVENTIONS - Miscellaneous []  - 0 External ear exam []  - 0 Specimen Collection (cultures, biopsies, blood, body fluids, etc.) []  - 0 Specimen(s) / Culture(s) sent or taken to Lab for analysis []  - 0 Patient Transfer (multiple staff / Civil Service fast streamer / Similar devices) []  - 0 Simple Staple / Suture removal (25 or less) []  - 0 Complex Staple / Suture removal (26 or more) []  - 0 Hypo / Hyperglycemic Management (close monitor of Blood Glucose) []  - 0 Ankle / Brachial Index (ABI) - do not check if billed separately X- 1 5 Vital Signs Has the patient been seen at the hospital within the last three years: Yes Total Score: 90 Level Of Care: New/Established - Level 3 Electronic Signature(s) Signed: 09/03/2020 6:17:15 PM By: Baruch Gouty RN, BSN Entered By: Baruch Gouty on 09/03/2020 09:21:23 -------------------------------------------------------------------------------- Encounter Discharge Information Details Patient Name: Date of Service: Deanna Levine, Deanna Levine 09/03/2020 8:15 A M Medical Record Number: [id number] Patient Account Number: [id number] Date of Birth/Sex: Treating RN: [birth year] (20 y.o. Nita Sells Primary Care Radford Pease: Pricilla Holm Other Clinician: Referring Siegfried Vieth: Treating Angelynn Lemus/Extender: Dreama Saa in Treatment: 13 Encounter Discharge Information Items Discharge Condition: Stable Ambulatory Status: Ambulatory Discharge Destination: Home Transportation: Private Auto Accompanied By: alone Schedule Follow-up Appointment: No Clinical Summary of Care: Patient Declined Electronic Signature(s) Signed: 09/03/2020 5:07:06 PM By: Leane Call Entered By: Leane Call on 09/03/2020 16:58:03 -------------------------------------------------------------------------------- Lower Extremity Assessment Details Patient Name: Date of Service: Deanna Levine, Deanna Levine 09/03/2020 8:15 A M Medical Record Number: [id number] Patient Account Number: [id number] Date of Birth/Sex: Treating RN: June 26, 2000 (20 y.o. Elam Dutch Primary Care Gumecindo Hopkin: Pricilla Holm Other Clinician: Referring Nikoletta Varma: Treating Manar Smalling/Extender: Dreama Saa in Treatment: 13 Electronic Signature(s) Signed: 09/03/2020 6:17:15 PM By: Baruch Gouty RN, BSN Entered By: Baruch Gouty on 09/03/2020 08:36:34 -------------------------------------------------------------------------------- [place] Details Patient Name: Date of Service: Deanna Levine, Deanna Levine 09/03/2020 8:15 A M Medical Record Number: [id number] Patient Account Number: [id number] Date of Birth/Sex: Treating RN: 01-Oct-2000 (20 y.o. Elam Dutch Primary Care Keagan Anthis: Other Clinician: Pricilla Holm Referring Solita Macadam: Treating Shaquella Stamant/Extender: Dreama Saa in Treatment: [place] reviewed with physician Active Inactive Wound/Skin Impairment Nursing Diagnoses: Impaired tissue integrity Knowledge deficit related to ulceration/compromised skin integrity Goals: Patient/caregiver will verbalize understanding of skin care regimen Date Initiated: 06/04/2020 Target Resolution Date: 09/24/2020 Goal Status: Active Ulcer/skin  breakdown will have a volume reduction of 30% by week 4 Date Initiated: 06/04/2020 Date Inactivated: 07/02/2020 Target Resolution Date: 07/02/2020 Goal Status: Met Ulcer/skin breakdown will have a volume reduction of 50% by week 8 Date Initiated: 07/02/2020 Date Inactivated: 09/03/2020 Target Resolution Date: 08/27/2020 Goal Status: Met Ulcer/skin breakdown will have a volume reduction of 80% by week 12 Date Initiated: 09/03/2020 Target Resolution Date: 10/01/2020 Goal Status: Active Interventions: Assess patient/caregiver ability to obtain necessary supplies Assess patient/caregiver ability to perform ulcer/skin care regimen upon admission and as needed Assess ulceration(s) every visit Provide education on ulcer  and skin care Treatment Activities: Topical wound management initiated : 06/04/2020 Notes: Electronic Signature(s) Signed: 09/03/2020 6:17:15 PM By: Baruch Gouty RN, BSN Entered By: Baruch Gouty on 09/03/2020 09:19:51 -------------------------------------------------------------------------------- Pain Assessment Details Patient Name: Date of Service: Deanna Levine, Deanna Levine 09/03/2020 8:15 A M Medical Record Number: [id number] Patient Account Number: [id number] Date of Birth/Sex: Treating RN: [birth year] (19 y.o. Elam Dutch Primary Care Verlinda Slotnick: Pricilla Holm Other Clinician: Referring Zimere Dunlevy: Treating Draken Farrior/Extender: Dreama Saa in Treatment: 13 Active Problems Location of Pain Severity and Description of Pain Patient Has Paino No Site Locations Rate the pain. Rate the pain. Current Pain Level: 0 Pain Management and Medication Current Pain Management: Electronic Signature(s) Signed: 09/03/2020 6:17:15 PM By: Baruch Gouty RN, BSN Entered By: Baruch Gouty on 09/03/2020 08:36:26 -------------------------------------------------------------------------------- Patient/Caregiver Education Details Patient Name: Date of  Service: Clarene Essex 7/6/2022andnbsp8:15 A M Medical Record Number: [id number] Patient Account Number: [id number] Date of Birth/Gender: Treating RN: 05/17/2000 (20 y.o. Elam Dutch Primary Care Physician: Pricilla Holm Other Clinician: Referring Physician: Treating Physician/Extender: Dreama Saa in Treatment: 13 Education Assessment Education Provided To: Patient Education Topics Provided Wound/Skin Impairment: Methods: Explain/Verbal Responses: Reinforcements needed, State content correctly [employer]) Signed: 09/03/2020 6:17:15 PM By: Baruch Gouty RN, BSN Entered By: Baruch Gouty on 09/03/2020 09:20:12 -------------------------------------------------------------------------------- Wound Assessment Details Patient Name: Date of Service: Deanna Levine, Deanna Levine 09/03/2020 8:15 A M Medical Record Number: [id number] Patient Account Number: [id number] Date of Birth/Sex: Treating RN: Jul 04, 2000 (20 y.o. Elam Dutch Primary Care Latwan Luchsinger: Pricilla Holm Other Clinician: Referring Pierce Barocio: Treating Ajooni Karam/Extender: Dreama Saa in Treatment: 13 Wound Status Wound Number: 1 Primary Hidradenitis Etiology: Wound Location: Right, Proximal Breast Wound Healed - Epithelialized Wounding Event: Gradually Appeared Status: Date Acquired: 11/30/2019 Comorbid Chronic sinus problems/congestion, Type II Diabetes, Weeks Of Treatment: 13 History: Osteoarthritis, Seizure Disorder Clustered Wound: No Wound Measurements Length: (cm) Width: (cm) Depth: (cm) Area: (cm) Volume: (cm) 0 % Reduction in Area: 100% 0 % Reduction in Volume: 100% 0 Epithelialization: Large (67-100%) 0 Tunneling: No 0 Undermining: No Wound Description Classification: Full Thickness Without Exposed Support Structures Exudate Amount: None Present Foul Odor After Cleansing: No Slough/Fibrino No Wound  Bed Granulation Amount: None Present (0%) Exposed Structure Necrotic Amount: None Present (0%) Fascia Exposed: No Fat Layer (Subcutaneous Tissue) Exposed: No Tendon Exposed: No Muscle Exposed: No Joint Exposed: No Bone Exposed: No Electronic Signature(s) Signed: 09/03/2020 6:17:15 PM By: Baruch Gouty RN, BSN Entered By: Baruch Gouty on 09/03/2020 08:40:57 -------------------------------------------------------------------------------- Wound Assessment Details Patient Name: Date of Service: Deanna Levine, Deanna Levine 09/03/2020 8:15 A M Medical Record Number: [id number] Patient Account Number: [id number] Date of Birth/Sex: Treating RN: 12-Dec-2000 (20 y.o. Elam Dutch Primary Care Hamda Klutts: Pricilla Holm Other Clinician: Referring Jaymee Tilson: Treating Shelly Spenser/Extender: Dreama Saa in Treatment: 13 Wound Status Wound Number: 4 Primary Hidradenitis Etiology: Wound Location: Left, Inferior Breast Wound Open Wounding Event: Gradually Appeared Status: Date Acquired: 07/30/2020 Comorbid Chronic sinus problems/congestion, Type II Diabetes, Weeks Of Treatment: 5 History: Osteoarthritis, Seizure Disorder Clustered Wound: No Photos Wound Measurements Length: (cm) 0.5 Width: (cm) 0.7 Depth: (cm) 0.4 Area: (cm) 0.275 Volume: (cm) 0.11 % Reduction in Area: -118.3% % Reduction in Volume: -746.2% Epithelialization: Small (1-33%) Tunneling: No Undermining: No Wound Description Classification: Full Thickness With Exposed Support Structures Wound Margin: Flat and Intact Exudate Amount: Medium Exudate Type: Serosanguineous Exudate Color: red, brown Foul Odor After Cleansing: No Slough/Fibrino No Wound Bed Granulation Amount:  Large (67-100%) Exposed Structure Granulation Quality: Red Fascia Exposed: No Necrotic Amount: None Present (0%) Fat Layer (Subcutaneous Tissue) Exposed: Yes Tendon Exposed: No Muscle Exposed: No Joint Exposed:  No Bone Exposed: No Treatment Notes Wound #4 (Breast) Wound Laterality: Left, Inferior Cleanser Peri-Wound Care Topical Primary Dressing Hydrofera Blue Ready Foam, 4x5 in Discharge Instruction: Apply to wound bed as instructed Secondary Dressing Bordered Gauze, 2x3.75 in Discharge Instruction: Apply over primary dressing as directed. Secured With Compression Wrap Compression Stockings Environmental education officer) Signed: 09/03/2020 5:22:20 PM By: Sandre Kitty Signed: 09/03/2020 6:17:15 PM By: Baruch Gouty RN, BSN Entered By: Sandre Kitty on 09/03/2020 17:12:27 -------------------------------------------------------------------------------- Vitals Details Patient Name: Date of Service: Deanna Levine, Deanna Levine 09/03/2020 8:15 A M Medical Record Number: [id number] Patient Account Number: [id number] Date of Birth/Sex: Treating RN: 11-05-2000 (20 y.o. Elam Dutch Primary Care Maximilien Hayashi: Pricilla Holm Other Clinician: Referring Eusevio Schriver: Treating Rashawd Laskaris/Extender: Dreama Saa in Treatment: 13 Vital Signs Time Taken: 08:34 Temperature (F): 97.8 Height (in): 64 Pulse (bpm): 76 Source: Stated Respiratory Rate (breaths/min): 18 Weight (lbs): 214 Blood Pressure (mmHg): 105/72 Source: Stated Capillary Blood Glucose (mg/dl): 130 Body Mass Index (BMI): 36.7 Reference Range: 80 - 120 mg / dl Notes glucose per pt report this am Electronic Signature(s) Signed: 09/03/2020 6:17:15 PM By: Baruch Gouty RN, BSN Entered By: Baruch Gouty on 09/03/2020 08:36:00

## 2020-09-09 ENCOUNTER — Telehealth: Payer: Self-pay | Admitting: Radiology

## 2020-09-09 NOTE — Telephone Encounter (Signed)
Patient's mom left voicemail on triage line stating patient was doing fine until she went back to work full time. [name] crying and having increased pain and swelling in both of her feet. They request urgent appointment.   [phone]

## 2020-09-10 ENCOUNTER — Ambulatory Visit: Payer: Self-pay

## 2020-09-10 ENCOUNTER — Other Ambulatory Visit: Payer: Self-pay

## 2020-09-10 ENCOUNTER — Ambulatory Visit (INDEPENDENT_AMBULATORY_CARE_PROVIDER_SITE_OTHER): Payer: 59 | Admitting: Family

## 2020-09-10 DIAGNOSIS — M79672 Pain in left foot: Secondary | ICD-10-CM

## 2020-09-10 DIAGNOSIS — S93622A Sprain of tarsometatarsal ligament of left foot, initial encounter: Secondary | ICD-10-CM | POA: Diagnosis not present

## 2020-09-10 NOTE — Progress Notes (Signed)
Office Visit Note   Patient: Deanna Levine           Date of Birth: [birth year]           MRN: [id number] Visit Date: 09/10/2020              Requested by: [name], MD [address] [place],  [place] [address] PCP: [name], MD  Chief Complaint  Patient presents with   Left Foot - Pain      HPI: The patient is a 20 year old woman who presents to complaining of left foot pain for the last 2 weeks.  She does have a limb length discrepancy and has a lift in her right foot she had just returned to work and has worked for about 2 weeks her mother reports that she typically walks on the lateral column of the left foot and this causes her to often trip she did have a twisting injury of the right foot and ankle and has had increasing pain since this swells and aches with prolonged standing and walking.  She stands and walks it for her job  Assessment & Plan: Visit Diagnoses:  1. Lisfranc's sprain, left, initial encounter   2. Pain in left foot     Plan: Concern for Lisfranc sprain of the left foot we will place her in a cam walker for the next 2 weeks did provide a note that she can return to work for light duty work if this is possible.  Will also send her over to [facility] clinic for lateral posting of her left shoe she will follow-up in the office in 2 weeks for clinical reevaluation  Follow-Up Instructions: No follow-ups on file.   Ortho Exam  Patient is alert, oriented, no adenopathy, well-dressed, normal affect, normal respiratory effort. On examination of the left foot she does have tenderness over the midfoot. Pain with distraction of lisfranc joint. there is no ecchymosis no erythema full free range of motion of the foot.   Imaging: XR Foot Complete Left  Result Date: 09/10/2020 Radiographs of left foot are negative. No acute finding.  No images are attached to the encounter.  Labs: Lab Results  Component Value Date   HGBA1C 6.3 (A) 07/24/2020    HGBA1C 6.2 04/24/2020   HGBA1C 6.6 (A) 12/11/2019   ESRSEDRATE CANCELED 02/07/2020   ESRSEDRATE 29 (H) 01/18/2020   ESRSEDRATE 33 (H) 10/09/2019   CRP 20.8 (H) 01/18/2020   CRP 26.7 (H) 10/09/2019   LABURIC 6.4 10/09/2019   REPTSTATUS 04/27/2020 FINAL 04/25/2020   CULT  04/25/2020    FEW Normal respiratory flora-no Staph aureus or Pseudomonas seen Performed at [facility], [place] [address], [place], [place] [address]      Lab Results  Component Value Date   ALBUMIN 4.4 03/05/2019   ALBUMIN 4.4 11/15/2018   ALBUMIN 4.6 05/06/2016    No results found for: MG Lab Results  Component Value Date   VD25OH 25.84 (L) 07/02/2020   VD25OH 26 (L) 10/09/2019   VD25OH 30 05/06/2016    No results found for: PREALBUMIN CBC EXTENDED Latest Ref Rng & Units 07/02/2020 04/25/2020 02/07/2020  WBC 4.5 - 10.5 K/uL 9.4 8.5 8.4  RBC 3.87 - 5.11 Mil/uL 5.05 5.10 5.15(H)  HGB 12.0 - 15.0 g/dL 10.5(L) 11.0(L) 10.6(L)  HCT 36.0 - 46.0 % 33.7(L) 35.4(L) 35.1  PLT 150.0 - 400.0 K/uL 319.0 334 379  NEUTROABS 1,500 - 7,800 cells/uL - - 5,527  LYMPHSABS 850 -  3,900 cells/uL - - 2,461     There is no height or weight on file to calculate BMI.  Orders:  Orders Placed This Encounter  Procedures   XR Foot Complete Left   No orders of the defined types were placed in this encounter.    Procedures: No procedures performed  Clinical Data: No additional findings.  ROS:  All other systems negative, except as noted in the HPI. Review of Systems  Objective: Vital Signs: There were no vitals taken for this visit.  Specialty Comments:  No specialty comments available.  PMFS History: Patient Active Problem List   Diagnosis Date Noted   Other adverse food reactions, not elsewhere classified, subsequent encounter 08/20/2020   Other allergic rhinitis 08/20/2020   Allergic conjunctivitis of both eyes 08/20/2020   Encounter for general adult medical examination with abnormal findings  07/03/2020   Leg length discrepancy 06/05/2020   Hidradenitis suppurativa 06/05/2020   Intellectual disability 06/05/2020   Achilles tendon contracture, bilateral    Elevated sedimentation rate 02/07/2020   Pain in right ankle and joints of right foot 02/07/2020   Anemia 12/29/2018   Type 2 diabetes mellitus ([place]) 08/15/2017   Elevated triglycerides with high cholesterol 12/09/2016   Hydradenitis 09/07/2016   Partial epilepsy with impairment of consciousness ([place]) 08/16/2012   Mild intellectual disability 08/16/2012   Laxity of ligament 08/16/2012   Delayed milestones 08/16/2012   Obesity 06/07/2011   Dyspepsia 02/11/2011   Acanthosis nigricans 02/11/2011   Global developmental delay    Petit mal without grand mal seizures ([place])    Dyspraxia    Past Medical History:  Diagnosis Date   Allergy    Complication of anesthesia    slow to wake up from anesthesia, occ HA with anesthesia    Diabetes mellitus without complication ([place])    type 2   Difficulty swallowing pills    Dyspraxia    Eczema    Global developmental delay    Obesity (BMI 30-39.9)    PONV (postoperative nausea and vomiting)    Precocious female puberty    Seizures ([place])    last seizure 2012 - no current med    Family History  Problem Relation Age of Onset   Diabetes Mother        steroid induced; hx. colitis   Anesthesia problems Mother        severe headache lasting 2-3 days   Rheum arthritis Mother    Ulcerative colitis Mother    Fibromyalgia Mother    Hypertension Maternal Grandmother    Hyperlipidemia Maternal Grandmother    Diabetes Maternal Grandmother    Kidney disease Maternal Grandfather    Hypertension Maternal Grandfather    Heart disease Maternal Grandfather    Hyperlipidemia Maternal Grandfather    Kidney disease Paternal Grandmother    Hypertension Paternal Grandmother    Diabetes Paternal Grandmother    Heart disease Paternal Grandmother    Hyperlipidemia Paternal Grandmother     Hypertension Paternal Grandfather    Hyperlipidemia Paternal Grandfather    Henoch-Schonlein purpura Brother        in remission   Asthma Maternal Aunt    Seizures Maternal Uncle     Past Surgical History:  Procedure Laterality Date   ADENOIDECTOMY     GASTROCNEMIUS RECESSION Bilateral 04/25/2020   Procedure: BILATERAL GASTROCNEMIUS RECESSION;  Surgeon: Newt Minion, MD;  Location: [facility];  Service: Orthopedics;  Laterality: Bilateral;   SUPPRELIN IMPLANT  05/06/2011   Procedure: SUPPRELIN IMPLANT;  Surgeon: Jerilynn Mages. Gerald Stabs, MD;  Location: [facility];  Service: Pediatrics;  Laterality: Right;   SUPPRELIN IMPLANT Right 06/14/2013   Procedure: REMOVAL OF SUPPRELIN IMPLANT FROM RIGHT UPPER ARM;  Surgeon: Jerilynn Mages. Gerald Stabs, MD;  Location: [facility];  Service: Pediatrics;  Laterality: Right;   TONSILLECTOMY     TONSILLECTOMY AND ADENOIDECTOMY     TYMPANOSTOMY TUBE PLACEMENT     x 2   Social History   Occupational History   Occupation: [employer]  Tobacco Use   Smoking status: Never   Smokeless tobacco: Never  Vaping Use   Vaping Use: Never used  Substance and Sexual Activity   Alcohol use: No    Alcohol/week: 0.0 standard drinks   Drug use: No   Sexual activity: Never    Birth control/protection: None

## 2020-09-19 ENCOUNTER — Telehealth: Payer: Self-pay | Admitting: Orthopedic Surgery

## 2020-09-19 NOTE — Telephone Encounter (Signed)
Received medical records release form,$25.00 check and disability paperwork/Forwarding to [facility] today

## 2020-09-24 ENCOUNTER — Ambulatory Visit (INDEPENDENT_AMBULATORY_CARE_PROVIDER_SITE_OTHER): Payer: 59 | Admitting: Family

## 2020-09-24 ENCOUNTER — Encounter: Payer: Self-pay | Admitting: Family

## 2020-09-24 DIAGNOSIS — S93622A Sprain of tarsometatarsal ligament of left foot, initial encounter: Secondary | ICD-10-CM | POA: Diagnosis not present

## 2020-09-24 NOTE — Progress Notes (Signed)
Office Visit Note   Patient: Deanna Levine           Date of Birth: [birth year]           MRN: [id number] Visit Date: 09/24/2020              Requested by: [name], MD [address] [place],  [place] [address] PCP: [name], MD  Chief Complaint  Patient presents with   Left Foot - Follow-up    Lisfranc sprain       HPI: The patient is a 20 year old woman who presents today in follow-up for left foot pain.  She has been in a short cam walker for the last 2 weeks has been out of work.  She continues to have some mild pain if she ambulates without her boot but overall in the boot she has been pain-free she is seen [facility] and lateral posting for her shoe wear is in process of being made right now.    As this has been a waxing and waning problem for quite some time her mother is quite concerned.    She does have a limb length discrepancy    her mother reports that she typically walks on the lateral column of the left foot and this causes her to often trip she did have a twisting injury of the right foot and ankle and has had increasing pain since this swells and aches with prolonged standing and walking.  She stands and walks on it for her job  Assessment & Plan: Visit Diagnoses:  1. Lisfranc's sprain, left, initial encounter     Plan: Concern for Lisfranc sprain of the left foot.  We will go ahead and proceed with MRI of the left foot  They will trial coming out of the cam walker did provide some padding for some lateral support in her shoe wear on the left she will continue out of work for the next 2 weeks we will plan to reevaluate in 2 weeks    Follow-Up Instructions: Return in about 2 weeks (around 10/08/2020) for MRI review.   Ortho Exam  Patient is alert, oriented, no adenopathy, well-dressed, normal affect, normal respiratory effort. On examination of the left foot she does have tenderness over the midfoot this is some improved today.  Pain with distraction of lisfranc joint. there is no ecchymosis no erythema full free range of motion of the foot.   Imaging: No results found. No images are attached to the encounter.  Labs: Lab Results  Component Value Date   HGBA1C 6.3 (A) 07/24/2020   HGBA1C 6.2 04/24/2020   HGBA1C 6.6 (A) 12/11/2019   ESRSEDRATE CANCELED 02/07/2020   ESRSEDRATE 29 (H) 01/18/2020   ESRSEDRATE 33 (H) 10/09/2019   CRP 20.8 (H) 01/18/2020   CRP 26.7 (H) 10/09/2019   LABURIC 6.4 10/09/2019   REPTSTATUS 04/27/2020 FINAL 04/25/2020   CULT  04/25/2020    FEW Normal respiratory flora-no Staph aureus or Pseudomonas seen Performed at [facility], [place] [address], [place], [place] [address]      Lab Results  Component Value Date   ALBUMIN 4.4 03/05/2019   ALBUMIN 4.4 11/15/2018   ALBUMIN 4.6 05/06/2016    No results found for: MG Lab Results  Component Value Date   VD25OH 25.84 (L) 07/02/2020   VD25OH 26 (L) 10/09/2019   VD25OH 30 05/06/2016    No results found for: PREALBUMIN CBC EXTENDED Latest Ref Rng & Units 07/02/2020  04/25/2020 02/07/2020  WBC 4.5 - 10.5 K/uL 9.4 8.5 8.4  RBC 3.87 - 5.11 Mil/uL 5.05 5.10 5.15(H)  HGB 12.0 - 15.0 g/dL 10.5(L) 11.0(L) 10.6(L)  HCT 36.0 - 46.0 % 33.7(L) 35.4(L) 35.1  PLT 150.0 - 400.0 K/uL 319.0 334 379  NEUTROABS 1,500 - 7,800 cells/uL - - 5,527  LYMPHSABS 850 - 3,900 cells/uL - - 2,461     There is no height or weight on file to calculate BMI.  Orders:  Orders Placed This Encounter  Procedures   MR Foot Left w/o contrast   No orders of the defined types were placed in this encounter.    Procedures: No procedures performed  Clinical Data: No additional findings.  ROS:  All other systems negative, except as noted in the HPI. Review of Systems  Objective: Vital Signs: There were no vitals taken for this visit.  Specialty Comments:  No specialty comments available.  PMFS History: Patient Active Problem List   Diagnosis  Date Noted   Other adverse food reactions, not elsewhere classified, subsequent encounter 08/20/2020   Other allergic rhinitis 08/20/2020   Allergic conjunctivitis of both eyes 08/20/2020   Encounter for general adult medical examination with abnormal findings 07/03/2020   Leg length discrepancy 06/05/2020   Hidradenitis suppurativa 06/05/2020   Intellectual disability 06/05/2020   Achilles tendon contracture, bilateral    Elevated sedimentation rate 02/07/2020   Pain in right ankle and joints of right foot 02/07/2020   Anemia 12/29/2018   Type 2 diabetes mellitus ([place]) 08/15/2017   Elevated triglycerides with high cholesterol 12/09/2016   Hydradenitis 09/07/2016   Partial epilepsy with impairment of consciousness ([place]) 08/16/2012   Mild intellectual disability 08/16/2012   Laxity of ligament 08/16/2012   Delayed milestones 08/16/2012   Obesity 06/07/2011   Dyspepsia 02/11/2011   Acanthosis nigricans 02/11/2011   Global developmental delay    Petit mal without grand mal seizures ([place])    Dyspraxia    Past Medical History:  Diagnosis Date   Allergy    Complication of anesthesia    slow to wake up from anesthesia, occ HA with anesthesia    Diabetes mellitus without complication ([place])    type 2   Difficulty swallowing pills    Dyspraxia    Eczema    Global developmental delay    Obesity (BMI 30-39.9)    PONV (postoperative nausea and vomiting)    Precocious female puberty    Seizures ([place])    last seizure 2012 - no current med    Family History  Problem Relation Age of Onset   Diabetes Mother        steroid induced; hx. colitis   Anesthesia problems Mother        severe headache lasting 2-3 days   Rheum arthritis Mother    Ulcerative colitis Mother    Fibromyalgia Mother    Hypertension Maternal Grandmother    Hyperlipidemia Maternal Grandmother    Diabetes Maternal Grandmother    Kidney disease Maternal Grandfather    Hypertension Maternal Grandfather    Heart  disease Maternal Grandfather    Hyperlipidemia Maternal Grandfather    Kidney disease Paternal Grandmother    Hypertension Paternal Grandmother    Diabetes Paternal Grandmother    Heart disease Paternal Grandmother    Hyperlipidemia Paternal Grandmother    Hypertension Paternal Grandfather    Hyperlipidemia Paternal Grandfather    Henoch-Schonlein purpura Brother        in remission   Asthma Maternal Aunt  Seizures Maternal Uncle     Past Surgical History:  Procedure Laterality Date   ADENOIDECTOMY     GASTROCNEMIUS RECESSION Bilateral 04/25/2020   Procedure: BILATERAL GASTROCNEMIUS RECESSION;  Surgeon: Newt Minion, MD;  Location: [facility];  Service: Orthopedics;  Laterality: Bilateral;   SUPPRELIN IMPLANT  05/06/2011   Procedure: SUPPRELIN IMPLANT;  Surgeon: Jerilynn Mages. Gerald Stabs, MD;  Location: [facility];  Service: Pediatrics;  Laterality: Right;   SUPPRELIN IMPLANT Right 06/14/2013   Procedure: REMOVAL OF SUPPRELIN IMPLANT FROM RIGHT UPPER ARM;  Surgeon: Jerilynn Mages. Gerald Stabs, MD;  Location: [facility];  Service: Pediatrics;  Laterality: Right;   TONSILLECTOMY     TONSILLECTOMY AND ADENOIDECTOMY     TYMPANOSTOMY TUBE PLACEMENT     x 2   Social History   Occupational History   Occupation: [employer]  Tobacco Use   Smoking status: Never   Smokeless tobacco: Never  Vaping Use   Vaping Use: Never used  Substance and Sexual Activity   Alcohol use: No    Alcohol/week: 0.0 standard drinks   Drug use: No   Sexual activity: Never    Birth control/protection: None

## 2020-09-26 ENCOUNTER — Encounter (HOSPITAL_BASED_OUTPATIENT_CLINIC_OR_DEPARTMENT_OTHER): Payer: 59 | Admitting: Pulmonary Disease

## 2020-09-30 ENCOUNTER — Other Ambulatory Visit: Payer: 59

## 2020-10-01 ENCOUNTER — Other Ambulatory Visit: Payer: Self-pay

## 2020-10-01 ENCOUNTER — Encounter (HOSPITAL_BASED_OUTPATIENT_CLINIC_OR_DEPARTMENT_OTHER): Payer: 59 | Attending: Physician Assistant | Admitting: Physician Assistant

## 2020-10-01 DIAGNOSIS — Z6836 Body mass index (BMI) 36.0-36.9, adult: Secondary | ICD-10-CM | POA: Insufficient documentation

## 2020-10-01 DIAGNOSIS — E669 Obesity, unspecified: Secondary | ICD-10-CM | POA: Insufficient documentation

## 2020-10-01 DIAGNOSIS — L732 Hidradenitis suppurativa: Secondary | ICD-10-CM | POA: Diagnosis not present

## 2020-10-01 DIAGNOSIS — R1319 Other dysphagia: Secondary | ICD-10-CM | POA: Diagnosis not present

## 2020-10-01 DIAGNOSIS — G40909 Epilepsy, unspecified, not intractable, without status epilepticus: Secondary | ICD-10-CM | POA: Diagnosis not present

## 2020-10-01 DIAGNOSIS — L98492 Non-pressure chronic ulcer of skin of other sites with fat layer exposed: Secondary | ICD-10-CM | POA: Insufficient documentation

## 2020-10-01 DIAGNOSIS — E11622 Type 2 diabetes mellitus with other skin ulcer: Secondary | ICD-10-CM | POA: Insufficient documentation

## 2020-10-01 NOTE — Progress Notes (Addendum)
MCKAILA, DUFFUS ([id number]) Visit Report for 10/01/2020 Chief Complaint Document Details Patient Name: Date of Service: Deanna Levine, Deanna Levine 10/01/2020 8:15 A M Medical Record Number: [id number] Patient Account Number: [id number] Date of Birth/Sex: Treating RN: 09/01/2000 (20 y.o. Elam Dutch Primary Care Provider: Pricilla Holm Other Clinician: Referring Provider: Treating Provider/Extender: Dreama Saa in Treatment: 17 Information Obtained from: Patient Chief Complaint Hidradenitis Right Breast Electronic Signature(s) Signed: 10/01/2020 9:11:51 AM By: Worthy Keeler PA-C Entered By: Worthy Keeler on 10/01/2020 09:11:51 -------------------------------------------------------------------------------- HPI Details Patient Name: Date of Service: Deanna Levine, Deanna Levine 10/01/2020 8:15 A M Medical Record Number: [id number] Patient Account Number: [id number] Date of Birth/Sex: Treating RN: [birth year] (20 y.o. Elam Dutch Primary Care Provider: Pricilla Holm Other Clinician: Referring Provider: Treating Provider/Extender: Dreama Saa in Treatment: 17 History of Present Illness HPI Description: 06/04/2020 patient presents today for initial evaluation here in clinic concerning issues she has been having with her under her arms, under the breast, in the suprapubic region, and on her upper back as well with issues which are consistent with hidradenitis though I think it is more on the milder side. Her and her mother have been on top of this trying to keep things under control. Her mother also has issues with hidradenitis though not severely either. This does seem to potentially be something that runs in the family. Nonetheless fortunately there does not appear to be any evidence of significant infection at this point. The wound which is actually under the right breast location actually does not appear to be infected right  now which is great news. With that being said I do see evidence currently of scarring in multiple locations which show that the patient has obviously had issues in the past that several other locations as well. All in the same regions. Nonetheless she does not have a tremendous amount of pain noted at this point which is good news. She does have some mild mental disability according to her mother she does have mild dysphagia as well which requires that she has to either take liquid or crushed antibiotics when she does get those. Her most recent hemoglobin A1c was 6.6. Currently this episode has been going on for about 6 months ago again 3 years as the total length of time she has been dealing with this. She has never seen a hidradenitis clinic. 06/18/20 upon evaluation today patient appears to be doing in my opinion a little better compared to her previous evaluation. There does not appear to be any signs of infection which is great news and overall very pleased with where things stand today. She has not heard from the hidradenitis clinic at [facility] yet although we made that referral last time she was here 2 weeks ago. 5//22 upon evaluation today patient appears to be doing well with regard to her wounds. I do think the Hydrofera Blue has been doing well for her. She has a new area open up on the left/medial chest just inside of her breast. With that being said this does not appear to be too significant at this point which is great news. With that being said she unfortunately is continuing to have new areas show up but again these are minimal and seem to be under fairly good control which is great as well. I do not see any signs of active infection at this point which is great news. She did get a call from [facility] from the hidradenitis clinic.  With that being said she is on a wait list it sounds like at this point. 07/30/2020 upon evaluation today patient appears to be doing well with regard to her wounds for the  most part. There are some areas that have actually healed quite nicely. With that being said she has some regions that do not seem to be healing nearly as effectively at this point. Overall I am pleased with what we are seeing for the most part but at the same time I do feel like that the new areas that pop up or not good she has an area underneath her underarm on the left as well. She has been on doxycycline. She still on the wait list at [facility] she has heard nothing from that according to her mother. In regard to the dermatologist that they have through [facility] that she will be seeing in November they are on a wait list there in case anybody cancels but have not heard anything from that. 09/03/2020 upon evaluation today patient appears to be doing well with regard to her the hidradenitis areas underneath the breast location. She has been tolerating the dressing changes without complication with been using Hydrofera Blue which is doing a great job. She does not have any open areas under the underarms either at this point. Everything seems to be doing quite well which is great. 10/01/2020 upon evaluation today patient appears to be doing well with regard to her wounds. Again she has mainly been seen for issues with hidradenitis and to be honest everything appears to be pretty much filled out at this point. There does not appear to be any evidence of active infection which is great news and overall I am extremely pleased with where things stand today. No fevers, chills, nausea, vomiting, or diarrhea. Electronic Signature(s) Signed: 10/01/2020 9:19:13 AM By: Worthy Keeler PA-C Entered By: Worthy Keeler on 10/01/2020 09:19:13 -------------------------------------------------------------------------------- Physical Exam Details Patient Name: Date of Service: Deanna Levine, Deanna Levine 10/01/2020 8:15 A M Medical Record Number: [id number] Patient Account Number: [id number] Date of Birth/Sex: Treating RN: Jan 19, 2001  (20 y.o. Elam Dutch Primary Care Provider: Pricilla Holm Other Clinician: Referring Provider: Treating Provider/Extender: Dreama Saa in Treatment: 17 Constitutional Obese and well-hydrated in no acute distress. Respiratory normal breathing without difficulty. Psychiatric this patient is able to make decisions and demonstrates good insight into disease process. Alert and Oriented x 3. pleasant and cooperative. Notes Upon inspection patient's wound bed showed signs of good granulation and epithelization at this point. In fact everything appears to be pretty much closed I see nothing open and draining the patient is noted nothing draining she is having no pain. Overall I think she is ready for discharge. Electronic Signature(s) Signed: 10/01/2020 9:19:31 AM By: Worthy Keeler PA-C Entered By: Worthy Keeler on 10/01/2020 09:19:31 -------------------------------------------------------------------------------- Physician Orders Details Patient Name: Date of Service: Deanna Levine, Deanna Levine 10/01/2020 8:15 A M Medical Record Number: [id number] Patient Account Number: [id number] Date of Birth/Sex: Treating RN: 04/03/2000 (20 y.o. Elam Dutch Primary Care Provider: Pricilla Holm Other Clinician: Referring Provider: Treating Provider/Extender: Dreama Saa in Treatment: [phone] Verbal / Phone Orders: No Diagnosis Coding ICD-10 Coding Code Description L73.2 Hidradenitis suppurativa L98.492 Non-pressure chronic ulcer of skin of other sites with fat layer exposed E11.622 Type 2 diabetes mellitus with other skin ulcer R13.19 Other dysphagia Discharge From [facility] Discharge from [facility]/ Shower/ Hygiene May shower and wash wound with soap  and water. Additional Orders / Instructions Other: - follow up with dermatology Electronic Signature(s) Signed: 10/01/2020 5:22:07 PM By: Worthy Keeler  PA-C Signed: 10/01/2020 5:57:23 PM By: Baruch Gouty RN, BSN Entered By: Baruch Gouty on 10/01/2020 09:18:52 -------------------------------------------------------------------------------- Problem List Details Patient Name: Date of Service: Deanna Levine, Deanna Levine 10/01/2020 8:15 A M Medical Record Number: [id number] Patient Account Number: [id number] Date of Birth/Sex: Treating RN: 2000-05-21 (20 y.o. Elam Dutch Primary Care Provider: Pricilla Holm Other Clinician: Referring Provider: Treating Provider/Extender: Dreama Saa in Treatment: [phone] Active Problems ICD-10 Encounter Code Description Active Date MDM Diagnosis L73.2 Hidradenitis suppurativa 06/04/2020 No Yes L98.492 Non-pressure chronic ulcer of skin of other sites with fat layer exposed 06/04/2020 No Yes E11.622 Type 2 diabetes mellitus with other skin ulcer 06/04/2020 No Yes R13.19 Other dysphagia 06/04/2020 No Yes Inactive Problems Resolved Problems Electronic Signature(s) Signed: 10/01/2020 9:11:42 AM By: Worthy Keeler PA-C Entered By: Worthy Keeler on 10/01/2020 09:11:42 -------------------------------------------------------------------------------- Progress Note Details Patient Name: Date of Service: Deanna Levine, Deanna Levine 10/01/2020 8:15 A M Medical Record Number: [id number] Patient Account Number: [id number] Date of Birth/Sex: Treating RN: 2000/05/04 (20 y.o. Elam Dutch Primary Care Provider: Pricilla Holm Other Clinician: Referring Provider: Treating Provider/Extender: Dreama Saa in Treatment: 17 Subjective Chief Complaint Information obtained from Patient Hidradenitis Right Breast History of Present Illness (HPI) 06/04/2020 patient presents today for initial evaluation here in clinic concerning issues she has been having with her under her arms, under the breast, in the suprapubic region, and on her upper back as well with issues  which are consistent with hidradenitis though I think it is more on the milder side. Her and her mother have been on top of this trying to keep things under control. Her mother also has issues with hidradenitis though not severely either. This does seem to potentially be something that runs in the family. Nonetheless fortunately there does not appear to be any evidence of significant infection at this point. The wound which is actually under the right breast location actually does not appear to be infected right now which is great news. With that being said I do see evidence currently of scarring in multiple locations which show that the patient has obviously had issues in the past that several other locations as well. All in the same regions. Nonetheless she does not have a tremendous amount of pain noted at this point which is good news. She does have some mild mental disability according to her mother she does have mild dysphagia as well which requires that she has to either take liquid or crushed antibiotics when she does get those. Her most recent hemoglobin A1c was 6.6. Currently this episode has been going on for about 6 months ago again 3 years as the total length of time she has been dealing with this. She has never seen a hidradenitis clinic. 06/18/20 upon evaluation today patient appears to be doing in my opinion a little better compared to her previous evaluation. There does not appear to be any signs of infection which is great news and overall very pleased with where things stand today. She has not heard from the hidradenitis clinic at [facility] yet although we made that referral last time she was here 2 weeks ago. 5//22 upon evaluation today patient appears to be doing well with regard to her wounds. I do think the Hydrofera Blue has been doing well for her. She has a new area open  up on the left/medial chest just inside of her breast. With that being said this does not appear to be too  significant at this point which is great news. With that being said she unfortunately is continuing to have new areas show up but again these are minimal and seem to be under fairly good control which is great as well. I do not see any signs of active infection at this point which is great news. She did get a call from [facility] from the hidradenitis clinic. With that being said she is on a wait list it sounds like at this point. 07/30/2020 upon evaluation today patient appears to be doing well with regard to her wounds for the most part. There are some areas that have actually healed quite nicely. With that being said she has some regions that do not seem to be healing nearly as effectively at this point. Overall I am pleased with what we are seeing for the most part but at the same time I do feel like that the new areas that pop up or not good she has an area underneath her underarm on the left as well. She has been on doxycycline. She still on the wait list at [facility] she has heard nothing from that according to her mother. In regard to the dermatologist that they have through [facility] that she will be seeing in November they are on a wait list there in case anybody cancels but have not heard anything from that. 09/03/2020 upon evaluation today patient appears to be doing well with regard to her the hidradenitis areas underneath the breast location. She has been tolerating the dressing changes without complication with been using Hydrofera Blue which is doing a great job. She does not have any open areas under the underarms either at this point. Everything seems to be doing quite well which is great. 10/01/2020 upon evaluation today patient appears to be doing well with regard to her wounds. Again she has mainly been seen for issues with hidradenitis and to be honest everything appears to be pretty much filled out at this point. There does not appear to be any evidence of active infection which is great news  and overall I am extremely pleased with where things stand today. No fevers, chills, nausea, vomiting, or diarrhea. Objective Constitutional Obese and well-hydrated in no acute distress. Vitals Time Taken: 8:19 AM, Height: 64 in, Weight: 214 lbs, BMI: 36.7, Temperature: 98.4 F, Pulse: 100 bpm, Respiratory Rate: 20 breaths/min, Blood Pressure: 131/82 mmHg, Capillary Blood Glucose: 135 mg/dl. General Notes: glucose per pt report Respiratory normal breathing without difficulty. Psychiatric this patient is able to make decisions and demonstrates good insight into disease process. Alert and Oriented x 3. pleasant and cooperative. General Notes: Upon inspection patient's wound bed showed signs of good granulation and epithelization at this point. In fact everything appears to be pretty much closed I see nothing open and draining the patient is noted nothing draining she is having no pain. Overall I think she is ready for discharge. Integumentary (Hair, Skin) Wound #4 status is Healed - Epithelialized. Original cause of wound was Gradually Appeared. The date acquired was: 07/30/2020. The wound has been in treatment 9 weeks. The wound is located on the Left,Inferior Breast. The wound measures 0cm length x 0cm width x 0cm depth; 0cm^2 area and 0cm^3 volume. There is Fat Layer (Subcutaneous Tissue) exposed. There is no tunneling or undermining noted. There is a medium amount of serosanguineous drainage noted. The  wound margin is flat and intact. There is large (67-100%) red granulation within the wound bed. There is no necrotic tissue within the wound bed. Assessment Active Problems ICD-10 Hidradenitis suppurativa Non-pressure chronic ulcer of skin of other sites with fat layer exposed Type 2 diabetes mellitus with other skin ulcer Other dysphagia Plan Discharge From [facility] Services: Discharge from [place] Bathing/ Shower/ Hygiene: May shower and wash wound with soap and  water. Additional Orders / Instructions: Other: - follow up with dermatology 1. I would recommend currently that we discharged the patient today. With that being said I do think it still worthwhile to have her see dermatology to see what can be done to prevent ongoing issues as to be honest this is still something that is very much a chronic issue that can obviously recur. 2. With regard to any wounds in the meantime until she sees dermatology if she has any issues I recommend that she contact the office and let me know. We will see the patient back for follow-up visit as needed. Electronic Signature(s) Signed: 10/01/2020 9:20:06 AM By: Worthy Keeler PA-C Entered By: Worthy Keeler on 10/01/2020 09:20:06 -------------------------------------------------------------------------------- SuperBill Details Patient Name: Date of Service: Deanna Levine, Deanna Levine 10/01/2020 Medical Record Number: [id number] Patient Account Number: [id number] Date of Birth/Sex: Treating RN: Oct 23, 2000 (20 y.o. Elam Dutch Primary Care Provider: Pricilla Holm Other Clinician: Referring Provider: Treating Provider/Extender: Dreama Saa in Treatment: 17 Diagnosis Coding ICD-10 Codes Code Description L73.2 Hidradenitis suppurativa L98.492 Non-pressure chronic ulcer of skin of other sites with fat layer exposed E11.622 Type 2 diabetes mellitus with other skin ulcer R13.19 Other dysphagia Facility Procedures CPT4 Code: [id number] Description: 10932 - WOUND CARE VISIT-LEV 2 EST PT Modifier: Quantity: 1 Physician Procedures Electronic Signature(s) Signed: 10/01/2020 5:22:07 PM By: Worthy Keeler PA-C Signed: 10/01/2020 5:57:23 PM By: Baruch Gouty RN, BSN Previous Signature: 10/01/2020 9:20:17 AM Version By: Worthy Keeler PA-C Entered By: Baruch Gouty on 10/01/2020 09:51:43

## 2020-10-03 NOTE — Progress Notes (Signed)
Deanna Levine, Deanna Levine ([id number]) Visit Report for 10/01/2020 Arrival Information Details Patient Name: Date of Service: Deanna Levine, Deanna Levine 10/01/2020 8:15 A M Medical Record Number: [id number] Patient Account Number: [id number] Date of Birth/Sex: Treating RN: 2001-01-08 (20 y.o. Nancy Fetter Primary Care Denson Niccoli: Pricilla Holm Other Clinician: Referring Kelyse Pask: Treating Sahvanna Mcmanigal/Extender: Dreama Saa in Treatment: 54 Visit Information History Since Last Visit Added or deleted any medications: No Patient Arrived: Ambulatory Any new allergies or adverse reactions: No Arrival Time: 08:19 Had a fall or experienced change in No Accompanied By: alone activities of daily living that may affect Transfer Assistance: None risk of falls: Patient Identification Verified: Yes Signs or symptoms of abuse/neglect since last visito No Secondary Verification Process Completed: Yes Hospitalized since last visit: No Patient Requires Transmission-Based Precautions: No Implantable device outside of the clinic excluding No Patient Has Alerts: No cellular tissue based products placed in the center since last visit: Has Dressing in Place as Prescribed: Yes Pain Present Now: No Electronic Signature(s) Signed: 10/03/2020 12:35:09 PM By: Levan Hurst RN, BSN Entered By: Levan Hurst on 10/01/2020 08:19:29 -------------------------------------------------------------------------------- Clinic Level of Care Assessment Details Patient Name: Date of Service: Deanna Levine, Deanna Levine 10/01/2020 8:15 A M Medical Record Number: [id number] Patient Account Number: [id number] Date of Birth/Sex: Treating RN: Feb 14, 2001 (20 y.o. Elam Dutch Primary Care Lyman Balingit: Pricilla Holm Other Clinician: Referring Kirbi Farrugia: Treating Sharen Youngren/Extender: Dreama Saa in Treatment: 17 Clinic Level of Care Assessment Items TOOL 4 Quantity Score []  - 0 Use  when only an EandM is performed on FOLLOW-UP visit ASSESSMENTS - Nursing Assessment / Reassessment X- 1 10 Reassessment of Co-morbidities (includes updates in patient status) X- 1 5 Reassessment of Adherence to Treatment Plan ASSESSMENTS - Wound and Skin A ssessment / Reassessment X - Simple Wound Assessment / Reassessment - one wound 1 5 []  - 0 Complex Wound Assessment / Reassessment - multiple wounds []  - 0 Dermatologic / Skin Assessment (not related to wound area) ASSESSMENTS - Focused Assessment []  - 0 Circumferential Edema Measurements - multi extremities []  - 0 Nutritional Assessment / Counseling / Intervention []  - 0 Lower Extremity Assessment (monofilament, tuning fork, pulses) []  - 0 Peripheral Arterial Disease Assessment (using hand held doppler) ASSESSMENTS - Ostomy and/or Continence Assessment and Care []  - 0 Incontinence Assessment and Management []  - 0 Ostomy Care Assessment and Management (repouching, etc.) PROCESS - Coordination of Care X - Simple Patient / Family Education for ongoing care 1 15 []  - 0 Complex (extensive) Patient / Family Education for ongoing care X- 1 10 Staff obtains Programmer, systems, Records, T Results / Process Orders est []  - 0 Staff telephones HHA, Nursing Homes / Clarify orders / etc []  - 0 Routine Transfer to another Facility (non-emergent condition) []  - 0 Routine Hospital Admission (non-emergent condition) []  - 0 New Admissions / Biomedical engineer / Ordering NPWT Apligraf, etc. , []  - 0 Emergency Hospital Admission (emergent condition) X- 1 10 Simple Discharge Coordination []  - 0 Complex (extensive) Discharge Coordination PROCESS - Special Needs []  - 0 Pediatric / Minor Patient Management []  - 0 Isolation Patient Management []  - 0 Hearing / Language / Visual special needs []  - 0 Assessment of Community assistance (transportation, D/C planning, etc.) []  - 0 Additional assistance / Altered mentation []  - 0 Support  Surface(s) Assessment (bed, cushion, seat, etc.) INTERVENTIONS - Wound Cleansing / Measurement X - Simple Wound Cleansing - one wound 1 5 []  - 0 Complex Wound Cleansing - multiple  wounds X- 1 5 Wound Imaging (photographs - any number of wounds) []  - 0 Wound Tracing (instead of photographs) []  - 0 Simple Wound Measurement - one wound []  - 0 Complex Wound Measurement - multiple wounds INTERVENTIONS - Wound Dressings []  - 0 Small Wound Dressing one or multiple wounds []  - 0 Medium Wound Dressing one or multiple wounds []  - 0 Large Wound Dressing one or multiple wounds []  - 0 Application of Medications - topical []  - 0 Application of Medications - injection INTERVENTIONS - Miscellaneous []  - 0 External ear exam []  - 0 Specimen Collection (cultures, biopsies, blood, body fluids, etc.) []  - 0 Specimen(s) / Culture(s) sent or taken to Lab for analysis []  - 0 Patient Transfer (multiple staff / Civil Service fast streamer / Similar devices) []  - 0 Simple Staple / Suture removal (25 or less) []  - 0 Complex Staple / Suture removal (26 or more) []  - 0 Hypo / Hyperglycemic Management (close monitor of Blood Glucose) []  - 0 Ankle / Brachial Index (ABI) - do not check if billed separately X- 1 5 Vital Signs Has the patient been seen at the hospital within the last three years: Yes Total Score: 70 Level Of Care: New/Established - Level 2 Electronic Signature(s) Signed: 10/01/2020 5:57:23 PM By: Baruch Gouty RN, BSN Entered By: Baruch Gouty on 10/01/2020 09:51:35 -------------------------------------------------------------------------------- Multi-Disciplinary Care Plan Details Patient Name: Date of Service: Deanna Levine, Deanna Levine 10/01/2020 8:15 A M Medical Record Number: [id number] Patient Account Number: [id number] Date of Birth/Sex: Treating RN: [birth year] (20 y.o. Elam Dutch Primary Care Anis Degidio: Pricilla Holm Other Clinician: Referring Nastasha Reising: Treating Kassidee Narciso/Extender:  Dreama Saa in Treatment: [place] reviewed with physician Active Inactive Electronic Signature(s) Signed: 10/01/2020 5:57:23 PM By: Baruch Gouty RN, BSN Entered By: Baruch Gouty on 10/01/2020 09:17:41 -------------------------------------------------------------------------------- Pain Assessment Details Patient Name: Date of Service: Deanna Levine, Deanna Levine 10/01/2020 8:15 A M Medical Record Number: [id number] Patient Account Number: [id number] Date of Birth/Sex: Treating RN: [birth year] (20 y.o. Nancy Fetter Primary Care Verlaine Embry: Pricilla Holm Other Clinician: Referring Reagan Behlke: Treating Regis Wiland/Extender: Dreama Saa in Treatment: 17 Active Problems Location of Pain Severity and Description of Pain Patient Has Paino No Site Locations Pain Management and Medication Current Pain Management: Electronic Signature(s) Signed: 10/03/2020 12:35:09 PM By: Levan Hurst RN, BSN Entered By: Levan Hurst on 10/01/2020 08:19:37 -------------------------------------------------------------------------------- Patient/Caregiver Education Details Patient Name: Date of Service: Deanna Levine 8/3/2022andnbsp8:15 A M Medical Record Number: [id number] Patient Account Number: [id number] Date of Birth/Gender: Treating RN: 07-31-2000 (20 y.o. Elam Dutch Primary Care Physician: Pricilla Holm Other Clinician: Referring Physician: Treating Physician/Extender: Dreama Saa in Treatment: 17 Education Assessment Education Provided To: Patient Education Topics Provided Wound/Skin Impairment: Methods: Explain/Verbal Responses: Reinforcements needed, State content correctly [employer]) Signed: 10/01/2020 5:57:23 PM By: Baruch Gouty RN, BSN Entered By: Baruch Gouty on 10/01/2020  09:51:03 -------------------------------------------------------------------------------- Wound Assessment Details Patient Name: Date of Service: Deanna Levine, Deanna Levine 10/01/2020 8:15 A M Medical Record Number: [id number] Patient Account Number: [id number] Date of Birth/Sex: Treating RN: August 15, 2000 (20 y.o. Elam Dutch Primary Care Uniqua Kihn: Pricilla Holm Other Clinician: Referring Kariss Longmire: Treating Taym Twist/Extender: Dreama Saa in Treatment: 17 Wound Status Wound Number: 4 Primary Hidradenitis Etiology: Wound Location: Left, Inferior Breast Wound Healed - Epithelialized Wounding Event: Gradually Appeared Status: Date Acquired: 07/30/2020 Comorbid Chronic sinus problems/congestion, Type II Diabetes, Weeks Of Treatment: 9 History: Osteoarthritis, Seizure Disorder Clustered Wound: No Photos  Wound Measurements Length: (cm) Width: (cm) Depth: (cm) Area: (cm) Volume: (cm) 0 % Reduction in Area: 100% 0 % Reduction in Volume: 100% 0 Epithelialization: Large (67-100%) 0 Tunneling: No 0 Undermining: No Wound Description Classification: Full Thickness With Exposed Support Structures Wound Margin: Flat and Intact Exudate Amount: Medium Exudate Type: Serosanguineous Exudate Color: red, brown Foul Odor After Cleansing: No Slough/Fibrino No Wound Bed Granulation Amount: Large (67-100%) Exposed Structure Granulation Quality: Red Fascia Exposed: No Necrotic Amount: None Present (0%) Fat Layer (Subcutaneous Tissue) Exposed: Yes Tendon Exposed: No Muscle Exposed: No Joint Exposed: No Bone Exposed: No Electronic Signature(s) Signed: 10/01/2020 5:57:23 PM By: Baruch Gouty RN, BSN Entered By: Baruch Gouty on 10/01/2020 09:17:08 -------------------------------------------------------------------------------- [place] Details Patient Name: Date of Service: Deanna Levine, Deanna Levine 10/01/2020 8:15 A M Medical Record Number: [id number] Patient  Account Number: [id number] Date of Birth/Sex: Treating RN: [birth year] (20 y.o. Nancy Fetter Primary Care Merrilee Ancona: Pricilla Holm Other Clinician: Referring Vernis Eid: Treating Mardell Cragg/Extender: Dreama Saa in Treatment: 17 Vital Signs Time Taken: 08:19 Temperature (F): 98.4 Height (in): 64 Pulse (bpm): 100 Weight (lbs): 214 Respiratory Rate (breaths/min): 20 Body Mass Index (BMI): 36.7 Blood Pressure (mmHg): 131/82 Capillary Blood Glucose (mg/dl): 135 Reference Range: 80 - 120 mg / dl Notes glucose per pt report Electronic Signature(s) Signed: 10/03/2020 12:35:09 PM By: Levan Hurst RN, BSN Entered By: Levan Hurst on 10/01/2020 08:20:34

## 2020-10-06 ENCOUNTER — Other Ambulatory Visit: Payer: Self-pay | Admitting: Pulmonary Disease

## 2020-10-06 ENCOUNTER — Ambulatory Visit (INDEPENDENT_AMBULATORY_CARE_PROVIDER_SITE_OTHER): Payer: 59 | Admitting: Physician Assistant

## 2020-10-06 ENCOUNTER — Encounter: Payer: Self-pay | Admitting: Physician Assistant

## 2020-10-06 DIAGNOSIS — S93622A Sprain of tarsometatarsal ligament of left foot, initial encounter: Secondary | ICD-10-CM | POA: Diagnosis not present

## 2020-10-06 DIAGNOSIS — R0683 Snoring: Secondary | ICD-10-CM

## 2020-10-06 NOTE — Progress Notes (Signed)
Patient's insurance denied in lab study placing order for home sleep study

## 2020-10-06 NOTE — Progress Notes (Signed)
Office Visit Note   Patient: Deanna Levine           Date of Birth: [birth year]           MRN: [id number] Visit Date: 10/06/2020              Requested by: [name], MD [address] [place],  [place] [address] PCP: [name], MD  Chief Complaint  Patient presents with   Left Foot - Follow-up    Lisfranc sprain       HPI: Patient is in follow-up for her left foot.  She had a questionable Lisfranc injury a few weeks ago.  [name] now doing better and orthotics to the point that she does not remember which foot she injured.  An MRI was denied.  Assessment & Plan: Visit Diagnoses: No diagnosis found.  Plan: Patient may return to work should follow-up in 1 month if still issues.  Follow-Up Instructions: No follow-ups on file.   Ortho Exam  Patient is alert, oriented, no adenopathy, well-dressed, normal affect, normal respiratory effort. Left foot no swelling no ecchymosis no tenderness to palpation or with manipulation of the midfoot.  Good plantarflexion and dorsiflexion strength  Imaging: No results found. No images are attached to the encounter.  Labs: Lab Results  Component Value Date   HGBA1C 6.3 (A) 07/24/2020   HGBA1C 6.2 04/24/2020   HGBA1C 6.6 (A) 12/11/2019   ESRSEDRATE CANCELED 02/07/2020   ESRSEDRATE 29 (H) 01/18/2020   ESRSEDRATE 33 (H) 10/09/2019   CRP 20.8 (H) 01/18/2020   CRP 26.7 (H) 10/09/2019   LABURIC 6.4 10/09/2019   REPTSTATUS 04/27/2020 FINAL 04/25/2020   CULT  04/25/2020    FEW Normal respiratory flora-no Staph aureus or Pseudomonas seen Performed at [facility], [place] [address], [place],  [address]      Lab Results  Component Value Date   ALBUMIN 4.4 03/05/2019   ALBUMIN 4.4 11/15/2018   ALBUMIN 4.6 05/06/2016    No results found for: MG Lab Results  Component Value Date   VD25OH 25.84 (L) 07/02/2020   VD25OH 26 (L) 10/09/2019   VD25OH 30 05/06/2016    No results found for:  PREALBUMIN CBC EXTENDED Latest Ref Rng & Units 07/02/2020 04/25/2020 02/07/2020  WBC 4.5 - 10.5 K/uL 9.4 8.5 8.4  RBC 3.87 - 5.11 Mil/uL 5.05 5.10 5.15(H)  HGB 12.0 - 15.0 g/dL 10.5(L) 11.0(L) 10.6(L)  HCT 36.0 - 46.0 % 33.7(L) 35.4(L) 35.1  PLT 150.0 - 400.0 K/uL 319.0 334 379  NEUTROABS 1,500 - 7,800 cells/uL - - 5,527  LYMPHSABS 850 - 3,900 cells/uL - - 2,461     There is no height or weight on file to calculate BMI.  Orders:  No orders of the defined types were placed in this encounter.  No orders of the defined types were placed in this encounter.    Procedures: No procedures performed  Clinical Data: No additional findings.  ROS:  All other systems negative, except as noted in the HPI. Review of Systems  Objective: Vital Signs: There were no vitals taken for this visit.  Specialty Comments:  No specialty comments available.  PMFS History: Patient Active Problem List   Diagnosis Date Noted   Other adverse food reactions, not elsewhere classified, subsequent encounter 08/20/2020   Other allergic rhinitis 08/20/2020   Allergic conjunctivitis of both eyes 08/20/2020   Encounter for general adult medical examination with abnormal findings 07/03/2020   Leg length discrepancy  06/05/2020   Hidradenitis suppurativa 06/05/2020   Intellectual disability 06/05/2020   Achilles tendon contracture, bilateral    Elevated sedimentation rate 02/07/2020   Pain in right ankle and joints of right foot 02/07/2020   Anemia 12/29/2018   Type 2 diabetes mellitus ([place]) 08/15/2017   Elevated triglycerides with high cholesterol 12/09/2016   Hydradenitis 09/07/2016   Partial epilepsy with impairment of consciousness ([place]) 08/16/2012   Mild intellectual disability 08/16/2012   Laxity of ligament 08/16/2012   Delayed milestones 08/16/2012   Obesity 06/07/2011   Dyspepsia 02/11/2011   Acanthosis nigricans 02/11/2011   Global developmental delay    Petit mal without grand mal seizures  ([place])    Dyspraxia    Past Medical History:  Diagnosis Date   Allergy    Complication of anesthesia    slow to wake up from anesthesia, occ HA with anesthesia    Diabetes mellitus without complication ([place])    type 2   Difficulty swallowing pills    Dyspraxia    Eczema    Global developmental delay    Obesity (BMI 30-39.9)    PONV (postoperative nausea and vomiting)    Precocious female puberty    Seizures ([place])    last seizure 2012 - no current med    Family History  Problem Relation Age of Onset   Diabetes Mother        steroid induced; hx. colitis   Anesthesia problems Mother        severe headache lasting 2-3 days   Rheum arthritis Mother    Ulcerative colitis Mother    Fibromyalgia Mother    Hypertension Maternal Grandmother    Hyperlipidemia Maternal Grandmother    Diabetes Maternal Grandmother    Kidney disease Maternal Grandfather    Hypertension Maternal Grandfather    Heart disease Maternal Grandfather    Hyperlipidemia Maternal Grandfather    Kidney disease Paternal Grandmother    Hypertension Paternal Grandmother    Diabetes Paternal Grandmother    Heart disease Paternal Grandmother    Hyperlipidemia Paternal Grandmother    Hypertension Paternal Grandfather    Hyperlipidemia Paternal Grandfather    Henoch-Schonlein purpura Brother        in remission   Asthma Maternal Aunt    Seizures Maternal Uncle     Past Surgical History:  Procedure Laterality Date   ADENOIDECTOMY     GASTROCNEMIUS RECESSION Bilateral 04/25/2020   Procedure: BILATERAL GASTROCNEMIUS RECESSION;  Surgeon: Newt Minion, MD;  Location: [facility];  Service: Orthopedics;  Laterality: Bilateral;   SUPPRELIN IMPLANT  05/06/2011   Procedure: SUPPRELIN IMPLANT;  Surgeon: Jerilynn Mages. Gerald Stabs, MD;  Location: [facility];  Service: Pediatrics;  Laterality: Right;   SUPPRELIN IMPLANT Right 06/14/2013   Procedure: REMOVAL OF SUPPRELIN IMPLANT FROM RIGHT UPPER ARM;  Surgeon: Jerilynn Mages. Gerald Stabs, MD;  Location: [facility];  Service: Pediatrics;  Laterality: Right;   TONSILLECTOMY     TONSILLECTOMY AND ADENOIDECTOMY     TYMPANOSTOMY TUBE PLACEMENT     x 2   Social History   Occupational History   Occupation: [employer]  Tobacco Use   Smoking status: Never   Smokeless tobacco: Never  Vaping Use   Vaping Use: Never used  Substance and Sexual Activity   Alcohol use: No    Alcohol/week: 0.0 standard drinks   Drug use: No   Sexual activity: Never    Birth control/protection: None

## 2020-10-07 ENCOUNTER — Telehealth: Payer: Self-pay | Admitting: Orthopedic Surgery

## 2020-10-07 NOTE — Telephone Encounter (Signed)
Pt called stating she needs work note releasing her to go back with no restrictions. Pt would like to have this left at the front desk and would like a CB.   [phone]

## 2020-10-07 NOTE — Telephone Encounter (Signed)
Pt asking for a call back once a letter has been completed so she will know to come by and pick it up. The best call back number is [phone].

## 2020-10-08 ENCOUNTER — Other Ambulatory Visit: Payer: Self-pay

## 2020-10-08 ENCOUNTER — Telehealth: Payer: Self-pay | Admitting: Orthopedic Surgery

## 2020-10-08 ENCOUNTER — Ambulatory Visit: Payer: 59 | Admitting: Family

## 2020-10-08 NOTE — Telephone Encounter (Signed)
Called pt and lm on vm to advise that letter is at the front desk for pick up.

## 2020-10-08 NOTE — Telephone Encounter (Signed)
This has been done.

## 2020-10-08 NOTE — Telephone Encounter (Signed)
Pt called and states that Autumn had called her and I told her it may be for the letter and she said she already got it.   CB [phone]

## 2020-10-08 NOTE — Telephone Encounter (Signed)
Noted  

## 2020-10-14 ENCOUNTER — Telehealth (INDEPENDENT_AMBULATORY_CARE_PROVIDER_SITE_OTHER): Payer: 59 | Admitting: Family

## 2020-10-14 DIAGNOSIS — M76822 Posterior tibial tendinitis, left leg: Secondary | ICD-10-CM

## 2020-10-14 DIAGNOSIS — M76821 Posterior tibial tendinitis, right leg: Secondary | ICD-10-CM

## 2020-10-27 ENCOUNTER — Ambulatory Visit: Payer: 59

## 2020-10-27 ENCOUNTER — Other Ambulatory Visit: Payer: Self-pay

## 2020-10-27 ENCOUNTER — Ambulatory Visit (INDEPENDENT_AMBULATORY_CARE_PROVIDER_SITE_OTHER): Payer: 59 | Admitting: Pediatric Endocrinology

## 2020-10-27 DIAGNOSIS — G4733 Obstructive sleep apnea (adult) (pediatric): Secondary | ICD-10-CM | POA: Diagnosis not present

## 2020-10-27 DIAGNOSIS — R0683 Snoring: Secondary | ICD-10-CM

## 2020-10-28 ENCOUNTER — Ambulatory Visit (INDEPENDENT_AMBULATORY_CARE_PROVIDER_SITE_OTHER): Payer: 59

## 2020-10-28 ENCOUNTER — Encounter (INDEPENDENT_AMBULATORY_CARE_PROVIDER_SITE_OTHER): Payer: Self-pay

## 2020-10-28 ENCOUNTER — Encounter: Payer: Self-pay | Admitting: Podiatry

## 2020-10-28 ENCOUNTER — Encounter: Payer: Self-pay | Admitting: Podiatrist

## 2020-10-28 ENCOUNTER — Ambulatory Visit (INDEPENDENT_AMBULATORY_CARE_PROVIDER_SITE_OTHER): Payer: 59 | Admitting: Podiatrist

## 2020-10-28 DIAGNOSIS — M2142 Flat foot [pes planus] (acquired), left foot: Secondary | ICD-10-CM | POA: Diagnosis not present

## 2020-10-28 DIAGNOSIS — M76822 Posterior tibial tendinitis, left leg: Secondary | ICD-10-CM

## 2020-10-28 DIAGNOSIS — M2141 Flat foot [pes planus] (acquired), right foot: Secondary | ICD-10-CM

## 2020-10-28 DIAGNOSIS — M76821 Posterior tibial tendinitis, right leg: Secondary | ICD-10-CM

## 2020-10-28 MED ORDER — TRIAMCINOLONE ACETONIDE 10 MG/ML IJ SUSP
10.0000 mg | Freq: Once | INTRAMUSCULAR | Status: AC
  Administered 2020-10-28: 10 mg

## 2020-10-28 NOTE — Progress Notes (Signed)
  Chief Complaint  Patient presents with   Foot Pain    B/l- pt had surgery done ( lower leg) in Feb.2022 mentioned she has been having trouble with both feet and from top of foot to arch     HPI: Patient is 20 y.o. female who presents today with her parents for pain on the inside of both feet.  She relates pain when standing/ walking and when at work.  She works at [employer].  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 she has orthotics and her shoes built up in order to try and realign her walking.She has had surgery to release her achilles tendon,   [name] currently in physical therapy for the equinus.  She relates pain on the top and bottom of the feet bilateral.    Past medical history, medications, allergies reviewed.    No Known Allergies  Review of Systems No fevers, chills, nausea, muscle aches, no difficulty breathing, no calf pain, no chest pain or shortness of breath.   Physical Exam  GENERAL APPEARANCE: Alert, conversant. Appropriately groomed. No acute distress.   VASCULAR: Pedal pulses palpable DP and PT bilateral.  Capillary refill time is immediate to all digits,  Proximal to distal cooling it warm to warm.  Digital perfusion adequate.   NEUROLOGIC: sensation is intact to 5.07 monofilament at 5/5 sites bilateral.  Light touch is intact bilateral, vibratory sensation intact bilateral  MUSCULOSKELETAL: pes planus deformity is noted bilateral.  Upon standing she does laterally rotate which appears to become in station trying to keep the weight off the inside of her feet.  Pain on palpation is noted at the insertion of the posterior tibial tendon on the navicular bilateral.  Generalized discomfort on the dorsal lateral aspect of the medial cuneiform joint is also noted.  DERMATOLOGIC: skin is warm, supple, and dry.  No open lesions noted.  No rash, no pre ulcerative lesions. Digital nails are asymptomatic.    X-ray evaluation 3 views of bilateral feet are obtained.   A prominent navicular is noted bilateral.  Pes planus deformity is seen.  No fracture is seen.  Slight increase space between the medial and intermediate cuneiform is equal and symmetric.  Assessment   Pes planus with posterior tibial tendon dysfunction bilateral versus Lisfranc injury and sequela bilateral  Plan  Discussed x-ray and exam findings with the patient and her parents.  The patient has tried extensive conservative treatments which have failed to relieve her pain.  At this time I recommended a an injection into the area of tenderness on the medial aspect of bilateral feet.  This was carried out today under sterile technique with Kenalog 10 and Marcaine mixture.  The patient tolerated this well.  Compressive ankle sleeves were also dispensed for her use she is going to see the put orthotist and I recommended lateral posting on the orthotics to help bring her foot to neutral in the shoes.  She also has an MRI ordered already and this will help determine the origin of the foot pain.  She will be seen back in our office in 4 weeks and will call sooner if any questions or problems arise.

## 2020-10-28 NOTE — Patient Instructions (Signed)
Posterior Tibial Tendinitis Posterior tibial tendinitis is irritation of a tendon called the posterior tibial tendon. Your posterior tibial tendon is a cord-like tissue that connects bones of your lower leg and foot to a muscle that: Supports your arch. Helps you raise up on your toes. Helps you turn your foot down and in. This condition causes foot and ankle pain. It can also lead to a flat foot. What are the causes? This condition is most often caused by repeated stress to the tendon (overuse injury). It can also be caused by a sudden injury that stresses the tendon, such aslanding on your foot after jumping or falling. What increases the risk? This condition is more likely to develop in: People who play a sport that involves putting a lot of pressure on the feet, such as: Basketball. Tennis. Soccer. Hockey. Runners. Females who are older than 20 years of age and are overweight. People with diabetes. People with decreased foot stability. People with flat feet. What are the signs or symptoms? Symptoms include: Pain in the inner ankle. Pain at the arch of your foot. Pain that gets worse with running, walking, or standing. Swelling on the inside of your ankle and foot. Weakness in your ankle or foot. Inability to stand up on tiptoe. Flattening of the arch of your foot. How is this diagnosed? This condition may be diagnosed based on: Your symptoms. Your medical history. A physical exam. Tests, such as: X-ray. MRI. Ultrasound. How is this treated? This condition may be treated by: Putting ice to the injured area. Taking NSAIDs, such as ibuprofen, to reduce pain and swelling. Wearing a special shoe or shoe insert to support your arch (orthotic). Having physical therapy. Replacing high-impact exercise with low-impact exercise, such as swimming or cycling. If your symptoms do not improve with these treatments, you may need to wear a splint, removable walking boot, or short leg  cast for 6-8 weeks to keep your foot and ankle still (immobilized). Follow these instructions at home: If you have a cast, splint, or boot: Keep it clean and dry. Check the skin around it every day. Tell your health care provider about any concerns. If you have a cast: Do not stick anything inside it to scratch your skin. Doing that increases your risk of infection. You may put lotion on dry skin around the edges of the cast. Do not put lotion on the skin underneath the cast. If you have a splint or boot: Wear it as told by your health care provider. Remove it only as told by your health care provider. Loosen it if your toes tingle, become numb, or turn cold and blue. Bathing Do not take baths, swim, or use a hot tub until your health care provider approves. Ask your health care provider if you may take showers. If your cast, splint, or boot is not waterproof: Do not let it get wet. Cover it with a waterproof covering while you take a bath or a shower. Managing pain and swelling  If directed, put ice on the injured area. If you have a removable splint or boot, remove it as told by your health care provider. Put ice in a plastic bag. Place a towel between your skin and the bag or between your cast and the bag. Leave the ice on for 20 minutes, 2-3 times a day. Move your toes often to reduce stiffness and swelling. Raise (elevate) the injured area above the level of your heart while you are sitting or lying down.  Activity Do not use the injured foot to support your body weight until your health care provider says that you can. Use crutches as told by your health care provider. Do not do activities that make pain or swelling worse. Ask your health care provider when it is safe to drive if you have a cast, splint, or boot on your foot. Return to your normal activities as told by your health care provider. Ask your health care provider what activities are safe for you. Do exercises as told  by your health care provider. General instructions Take over-the-counter and prescription medicines only as told by your health care provider. If you have an orthotic, use it as told by your health care provider. Keep all follow-up visits as told by your health care provider. This is important. How is this prevented? Wear footwear that is appropriate to your athletic activity. Avoid athletic activities that cause pain or swelling in your ankle or foot. Before being active, do range-of-motion and stretching exercises. If you develop pain or swelling while training, stop training. If you have pain or swelling that does not improve after a few days of rest, see your health care provider. If you start a new athletic activity, start gradually so you can build up your strength and flexibility. Contact a health care provider if: Your symptoms get worse. Your symptoms do not improve in 6-8 weeks. You develop new, unexplained symptoms. Your splint, boot, or cast gets damaged. Summary Posterior tibial tendinitis is irritation of a tendon called the posterior tibial tendon. This condition is most often caused by repeated stress to the tendon (overuse injury). This condition causes foot pain and ankle pain. It can also lead to a flat foot. This condition may be treated by not doing high-impact activities, applying ice, having physical therapy, wearing orthotics, and wearing a cast, splint, or boot if needed. This information is not intended to replace advice given to you by your health care provider. Make sure you discuss any questions you have with your healthcare provider. Document Revised: 06/13/2018 Document Reviewed: 04/20/2018 Elsevier Patient Education  [place].

## 2020-11-04 ENCOUNTER — Other Ambulatory Visit: Payer: Self-pay

## 2020-11-04 ENCOUNTER — Encounter: Payer: Self-pay | Admitting: Family

## 2020-11-04 ENCOUNTER — Ambulatory Visit (INDEPENDENT_AMBULATORY_CARE_PROVIDER_SITE_OTHER): Payer: 59 | Admitting: Family

## 2020-11-04 DIAGNOSIS — M2141 Flat foot [pes planus] (acquired), right foot: Secondary | ICD-10-CM

## 2020-11-04 DIAGNOSIS — M76821 Posterior tibial tendinitis, right leg: Secondary | ICD-10-CM

## 2020-11-04 DIAGNOSIS — M2142 Flat foot [pes planus] (acquired), left foot: Secondary | ICD-10-CM

## 2020-11-04 DIAGNOSIS — M76822 Posterior tibial tendinitis, left leg: Secondary | ICD-10-CM | POA: Diagnosis not present

## 2020-11-04 MED ORDER — NABUMETONE 500 MG PO TABS: 500.0000 mg | ORAL_TABLET | Freq: Every day | ORAL | 0 refills | Status: DC

## 2020-11-04 NOTE — Progress Notes (Signed)
Office Visit Note   Patient: Deanna Levine           Date of Birth: 01-27-2001           MRN: [id number] Visit Date: 11/04/2020              Requested by: [name], MD [address] [place],  [place] [address] PCP: [name], MD  Chief Complaint  Patient presents with   Right Foot - Pain   Left Foot - Pain      HPI: The patient is a 20 year old woman who presents today in routine follow-up.  She has been having issues with pain over the dorsum of both feet.  She also has had some issues with swelling on the left.  She recently was given modification to her shoewear.  She does have a heel lift on the right she has custom orthotics.  We have applied lateral posting.  She had been weightbearing on the lateral column bilaterally to relieve the medial columns.  She has increased pain when she does work she likely has been able to return to work but she does have some pain at the end of the day  She was recently seen by podiatry for the same.  Diagnosed with posterior tibial tendon insufficiency bilateral feet.  She was given bilateral steroid injections these helped a little bit.  [name] also taking liquid Tylenol for pain this provides minimal relief.  Assessment & Plan: Visit Diagnoses: No diagnosis found.  Plan: Posterior tibial tendon dysfunction bilateral feet.  She will continue with her modifications to shoe wear. discussed natural course and progression of disease.  She will begin using anti-inflammatories will use this for the next 2 weeks.  Have sent prescription for Relafen hopes she will be able to crush this as she cannot swallow tablets.  She will follow-up in the office as needed  Follow-Up Instructions: Return if symptoms worsen or fail to improve.   Ortho Exam  Patient is alert, oriented, no adenopathy, well-dressed, normal affect, normal respiratory effort. On examination bilateral feet she does have pes planus deformity.  There is  tenderness over the navicular.  There is some mild tenderness along course of the posterior tibial tendon.  Pain with single limb heel raise  Imaging: No results found. No images are attached to the encounter.  Labs: Lab Results  Component Value Date   HGBA1C 6.3 (A) 07/24/2020   HGBA1C 6.2 04/24/2020   HGBA1C 6.6 (A) 12/11/2019   ESRSEDRATE CANCELED 02/07/2020   ESRSEDRATE 29 (H) 01/18/2020   ESRSEDRATE 33 (H) 10/09/2019   CRP 20.8 (H) 01/18/2020   CRP 26.7 (H) 10/09/2019   LABURIC 6.4 10/09/2019   REPTSTATUS 04/27/2020 FINAL 04/25/2020   CULT  04/25/2020    FEW Normal respiratory flora-no Staph aureus or Pseudomonas seen Performed at [facility], [place] [address] [place], [place] [address]      Lab Results  Component Value Date   ALBUMIN 4.4 03/05/2019   ALBUMIN 4.4 11/15/2018   ALBUMIN 4.6 05/06/2016    No results found for: MG Lab Results  Component Value Date   VD25OH 25.84 (L) 07/02/2020   VD25OH 26 (L) 10/09/2019   VD25OH 30 05/06/2016    No results found for: PREALBUMIN CBC EXTENDED Latest Ref Rng & Units 07/02/2020 04/25/2020 02/07/2020  WBC 4.5 - 10.5 K/uL 9.4 8.5 8.4  RBC 3.87 - 5.11 Mil/uL 5.05 5.10 5.15(H)  HGB 12.0 - 15.0 g/dL 10.5(L)  11.0(L) 10.6(L)  HCT 36.0 - 46.0 % 33.7(L) 35.4(L) 35.1  PLT 150.0 - 400.0 K/uL 319.0 334 379  NEUTROABS 1,500 - 7,800 cells/uL - - 5,527  LYMPHSABS 850 - 3,900 cells/uL - - 2,461     There is no height or weight on file to calculate BMI.  Orders:  No orders of the defined types were placed in this encounter.  Meds ordered this encounter  Medications   nabumetone (RELAFEN) 500 MG tablet    Sig: Take 1 tablet (500 mg total) by mouth daily.    Dispense:  30 tablet    Refill:  0     Procedures: No procedures performed  Clinical Data: No additional findings.  ROS:  All other systems negative, except as noted in the HPI. Review of Systems  Constitutional:  Negative for chills and fever.   Cardiovascular:  Negative for leg swelling.  Musculoskeletal:  Positive for myalgias.   Objective: Vital Signs: There were no vitals taken for this visit.  Specialty Comments:  No specialty comments available.  PMFS History: Patient Active Problem List   Diagnosis Date Noted   Other adverse food reactions, not elsewhere classified, subsequent encounter 08/20/2020   Other allergic rhinitis 08/20/2020   Allergic conjunctivitis of both eyes 08/20/2020   Encounter for general adult medical examination with abnormal findings 07/03/2020   Leg length discrepancy 06/05/2020   Hidradenitis suppurativa 06/05/2020   Intellectual disability 06/05/2020   Achilles tendon contracture, bilateral    Elevated sedimentation rate 02/07/2020   Pain in right ankle and joints of right foot 02/07/2020   Anemia 12/29/2018   Type 2 diabetes mellitus ([place]) 08/15/2017   Elevated triglycerides with high cholesterol 12/09/2016   Hydradenitis 09/07/2016   Partial epilepsy with impairment of consciousness ([place]) 08/16/2012   Mild intellectual disability 08/16/2012   Laxity of ligament 08/16/2012   Delayed milestones 08/16/2012   Obesity 06/07/2011   Dyspepsia 02/11/2011   Acanthosis nigricans 02/11/2011   Global developmental delay    Petit mal without grand mal seizures ([place])    Dyspraxia    Past Medical History:  Diagnosis Date   Allergy    Complication of anesthesia    slow to wake up from anesthesia, occ HA with anesthesia    Diabetes mellitus without complication ([place])    type 2   Difficulty swallowing pills    Dyspraxia    Eczema    Global developmental delay    Obesity (BMI 30-39.9)    PONV (postoperative nausea and vomiting)    Precocious female puberty    Seizures ([place])    last seizure 2012 - no current med    Family History  Problem Relation Age of Onset   Diabetes Mother        steroid induced; hx. colitis   Anesthesia problems Mother        severe headache lasting 2-3 days    Rheum arthritis Mother    Ulcerative colitis Mother    Fibromyalgia Mother    Hypertension Maternal Grandmother    Hyperlipidemia Maternal Grandmother    Diabetes Maternal Grandmother    Kidney disease Maternal Grandfather    Hypertension Maternal Grandfather    Heart disease Maternal Grandfather    Hyperlipidemia Maternal Grandfather    Kidney disease Paternal Grandmother    Hypertension Paternal Grandmother    Diabetes Paternal Grandmother    Heart disease Paternal Grandmother    Hyperlipidemia Paternal Grandmother    Hypertension Paternal Grandfather    Hyperlipidemia Paternal [employer] officer  Henoch-Schonlein purpura Brother        in remission   Asthma Maternal Aunt    Seizures Maternal Uncle     Past Surgical History:  Procedure Laterality Date   ADENOIDECTOMY     GASTROCNEMIUS RECESSION Bilateral 04/25/2020   Procedure: BILATERAL GASTROCNEMIUS RECESSION;  Surgeon: Newt Minion, MD;  Location: [facility];  Service: Orthopedics;  Laterality: Bilateral;   SUPPRELIN IMPLANT  05/06/2011   Procedure: SUPPRELIN IMPLANT;  Surgeon: Jerilynn Mages. Gerald Stabs, MD;  Location: [facility];  Service: Pediatrics;  Laterality: Right;   SUPPRELIN IMPLANT Right 06/14/2013   Procedure: REMOVAL OF SUPPRELIN IMPLANT FROM RIGHT UPPER ARM;  Surgeon: Jerilynn Mages. Gerald Stabs, MD;  Location: [facility];  Service: Pediatrics;  Laterality: Right;   TONSILLECTOMY     TONSILLECTOMY AND ADENOIDECTOMY     TYMPANOSTOMY TUBE PLACEMENT     x 2   Social History   Occupational History   Occupation: [employer]  Tobacco Use   Smoking status: Never   Smokeless tobacco: Never  Vaping Use   Vaping Use: Never used  Substance and Sexual Activity   Alcohol use: No    Alcohol/week: 0.0 standard drinks   Drug use: No   Sexual activity: Never    Birth control/protection: None

## 2020-11-04 NOTE — Patient Instructions (Signed)
Crush Relafen.  Take each morning for the next 2 weeks.  After 2 weeks use as needed for pain  Please call her pharmacy advises Relafen cannot be crushed    Posterior Tibial Tendinitis Posterior tibial tendinitis is irritation of a tendon called the posterior tibial tendon. Your posterior tibial tendon is a cord-like tissue that connects bones of your lower leg and foot to a muscle that: Supports your arch. Helps you raise up on your toes. Helps you turn your foot down and in. This condition causes foot and ankle pain. It can also lead to a flat foot. What are the causes? This condition is most often caused by repeated stress to the tendon (overuse injury). It can also be caused by a sudden injury that stresses the tendon, such as landing on your foot after jumping or falling. What increases the risk? This condition is more likely to develop in: People who play a sport that involves putting a lot of pressure on the feet, such as: Basketball. Tennis. Soccer. Hockey. Runners. Females who are older than 20 years of age and are overweight. People with diabetes. People with decreased foot stability. People with flat feet. What are the signs or symptoms? Symptoms include: Pain in the inner ankle. Pain at the arch of your foot. Pain that gets worse with running, walking, or standing. Swelling on the inside of your ankle and foot. Weakness in your ankle or foot. Inability to stand up on tiptoe. Flattening of the arch of your foot. How is this diagnosed? This condition may be diagnosed based on: Your symptoms. Your medical history. A physical exam. Tests, such as: X-ray. MRI. Ultrasound. How is this treated? This condition may be treated by: Putting ice to the injured area. Taking NSAIDs, such as ibuprofen, to reduce pain and swelling. Wearing a special shoe or shoe insert to support your arch (orthotic). Having physical therapy. Replacing high-impact exercise with low-impact  exercise, such as swimming or cycling. If your symptoms do not improve with these treatments, you may need to wear a splint, removable walking boot, or short leg cast for 6-8 weeks to keep your foot and ankle still (immobilized). Follow these instructions at home: If you have a cast, splint, or boot: Keep it clean and dry. Check the skin around it every day. Tell your health care provider about any concerns. If you have a cast: Do not stick anything inside it to scratch your skin. Doing that increases your risk of infection. You may put lotion on dry skin around the edges of the cast. Do not put lotion on the skin underneath the cast. If you have a splint or boot: Wear it as told by your health care provider. Remove it only as told by your health care provider. Loosen it if your toes tingle, become numb, or turn cold and blue. Bathing Do not take baths, swim, or use a hot tub until your health care provider approves. Ask your health care provider if you may take showers. If your cast, splint, or boot is not waterproof: Do not let it get wet. Cover it with a waterproof covering while you take a bath or a shower. Managing pain and swelling  If directed, put ice on the injured area. If you have a removable splint or boot, remove it as told by your health care provider. Put ice in a plastic bag. Place a towel between your skin and the bag or between your cast and the bag. Leave the ice on  for 20 minutes, 2-3 times a day. Move your toes often to reduce stiffness and swelling. Raise (elevate) the injured area above the level of your heart while you are sitting or lying down. Activity Do not use the injured foot to support your body weight until your health care provider says that you can. Use crutches as told by your health care provider. Do not do activities that make pain or swelling worse. Ask your health care provider when it is safe to drive if you have a cast, splint, or boot on your  foot. Return to your normal activities as told by your health care provider. Ask your health care provider what activities are safe for you. Do exercises as told by your health care provider. General instructions Take over-the-counter and prescription medicines only as told by your health care provider. If you have an orthotic, use it as told by your health care provider. Keep all follow-up visits as told by your health care provider. This is important. How is this prevented? Wear footwear that is appropriate to your athletic activity. Avoid athletic activities that cause pain or swelling in your ankle or foot. Before being active, do range-of-motion and stretching exercises. If you develop pain or swelling while training, stop training. If you have pain or swelling that does not improve after a few days of rest, see your health care provider. If you start a new athletic activity, start gradually so you can build up your strength and flexibility. Contact a health care provider if: Your symptoms get worse. Your symptoms do not improve in 6-8 weeks. You develop new, unexplained symptoms. Your splint, boot, or cast gets damaged. Summary Posterior tibial tendinitis is irritation of a tendon called the posterior tibial tendon. This condition is most often caused by repeated stress to the tendon (overuse injury). This condition causes foot pain and ankle pain. It can also lead to a flat foot. This condition may be treated by not doing high-impact activities, applying ice, having physical therapy, wearing orthotics, and wearing a cast, splint, or boot if needed. This information is not intended to replace advice given to you by your health care provider. Make sure you discuss any questions you have with your health care provider. Document Revised: 06/13/2018 Document Reviewed: 04/20/2018 Elsevier Patient Education  [place].

## 2020-11-05 ENCOUNTER — Encounter: Payer: Self-pay | Admitting: Podiatry

## 2020-11-11 ENCOUNTER — Other Ambulatory Visit: Payer: Self-pay | Admitting: Podiatrist

## 2020-11-11 ENCOUNTER — Telehealth: Payer: Self-pay | Admitting: Podiatrist

## 2020-11-11 DIAGNOSIS — G4733 Obstructive sleep apnea (adult) (pediatric): Secondary | ICD-10-CM | POA: Diagnosis not present

## 2020-11-11 MED ORDER — MELOXICAM 15 MG PO TABS: 15.0000 mg | ORAL_TABLET | Freq: Every day | ORAL | 0 refills | Status: DC

## 2020-11-11 NOTE — Progress Notes (Signed)
Called in meloxicam for pain from plantar fasciitis-  instructed patient to discontinue taking relafen while on meloxicam.

## 2020-11-11 NOTE — Telephone Encounter (Signed)
Patient called the office stating her left has been hurting while at work. She states that she was given a steroid shot her last visit and it didn't work , she would like advice on what to do next her next appointment is with Dr.McDonald on 11/25/20.

## 2020-11-11 NOTE — Telephone Encounter (Signed)
I called the patient back and her mother stated that she has received the medication relafen on 11/04/20 from [facility] and she states that medication is not working . Her mother wants to know she can be sent get a MRI or ultrasound.

## 2020-11-12 ENCOUNTER — Telehealth: Payer: Self-pay | Admitting: Podiatrist

## 2020-11-12 NOTE — Telephone Encounter (Signed)
Ms. Kirn called and wants to know if  she can be sent get a MRI or ultrasound. She said that her left foot really hurts a lot when she is at work.

## 2020-11-13 ENCOUNTER — Encounter (INDEPENDENT_AMBULATORY_CARE_PROVIDER_SITE_OTHER): Payer: Self-pay | Admitting: Pediatric Endocrinology

## 2020-11-13 ENCOUNTER — Ambulatory Visit (INDEPENDENT_AMBULATORY_CARE_PROVIDER_SITE_OTHER): Payer: 59 | Admitting: Pediatric Endocrinology

## 2020-11-13 ENCOUNTER — Other Ambulatory Visit: Payer: Self-pay | Admitting: Podiatrist

## 2020-11-13 ENCOUNTER — Other Ambulatory Visit: Payer: Self-pay

## 2020-11-13 ENCOUNTER — Telehealth (INDEPENDENT_AMBULATORY_CARE_PROVIDER_SITE_OTHER): Payer: Self-pay | Admitting: Pharmacist

## 2020-11-13 VITALS — BP 122/84 | HR 88 | Ht 64.17 in | Wt 217.0 lb

## 2020-11-13 DIAGNOSIS — M722 Plantar fascial fibromatosis: Secondary | ICD-10-CM

## 2020-11-13 DIAGNOSIS — Q8789 Other specified congenital malformation syndromes, not elsewhere classified: Secondary | ICD-10-CM

## 2020-11-13 DIAGNOSIS — Z6837 Body mass index (BMI) 37.0-37.9, adult: Secondary | ICD-10-CM | POA: Diagnosis not present

## 2020-11-13 DIAGNOSIS — M76822 Posterior tibial tendinitis, left leg: Secondary | ICD-10-CM

## 2020-11-13 DIAGNOSIS — E11628 Type 2 diabetes mellitus with other skin complications: Secondary | ICD-10-CM

## 2020-11-13 LAB — POCT GLYCOSYLATED HEMOGLOBIN (HGB A1C): Hemoglobin A1C: 6.3 % — AB (ref 4.0–5.6)

## 2020-11-13 LAB — POCT GLUCOSE (DEVICE FOR HOME USE): POC Glucose: 209 mg/dl — AB (ref 70–99)

## 2020-11-13 MED ORDER — MOUNJARO 2.5 MG/0.5ML ~~LOC~~ SOAJ: 2.5000 mg | SUBCUTANEOUS | 5 refills | Status: DC

## 2020-11-13 NOTE — Progress Notes (Signed)
Subjective:  Subjective  Levine Name: Deanna Levine Date of Birth: January 23, 2001  MRN: [id number]  Deanna Levine  presents to clinic today for follow-up evaluation and management of her  obesity, prediabetes, acanthosis   HISTORY OF PRESENT ILLNESS:   Deanna Levine is a 20 y.o. [address] female   Deanna Levine is accompanied by her mom  1. Deanna Levine was initially followed in pediatric endocrine clinic for early puberty complicated by profound developmental delay. [name] now post menarchal and follows in clinic for Type 2 Diabetes and metabolic syndrome.    2. The Levine's last [facility] visit was on 07/24/20 .   [name] taking the Ozempic 0.5 mg. [name] not having any issues with this dose. She feels ready to increase her dose.   She just had a steroid shot in her feet. She does not feel that the surgery she had in February was helpful. She has been having a lot of issues with her feet and has needed special insoles and braces.   [name] getting PT for her feet.   [name] not wearing her Dexcom because it is always falling off. They are hoping she will be able to wear it this winter.   She has not had any further seizure activity and they have not yet redone the 2 day EEG.   She did have a sleep study but she does not yet have the results.   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   3. Pertinent Review of Systems:  Constitutional: The Levine feels "good". The Levine seems healthy and active. Eyes: Vision seems to be good. There are no recognized eye problems. Wears glasses.  Neck: The Levine has no complaints of anterior neck swelling, soreness, tenderness, pressure, discomfort, or difficulty swallowing.   Heart: Heart rate increases with exercise or other physical activity. The Levine has no complaints of palpitations, irregular heart beats, chest pain, or chest pressure.   Lungs: no asthma or wheezing.  Gastrointestinal: Bowel  movents seem normal. The Levine has no complaints of excessive hunger, acid reflux, upset stomach, stomach aches or pains, diarrhea. Some constipation.  Legs: Muscle mass and strength seem normal. There are no complaints of numbness, tingling, burning, or pain. No edema is noted.  Feet: Per HPI Neurologic: Has been having staring spells. [name] followed by [name] in adult neurology GYN/GU: Menarche 07/2014, age 76. LMP 11/12/20. [name] no longer having issues with heavy periods. Skin: no current sores.    Glucose review:  She has not had her Dexcom on   Annual labs- done with PCP    PAST MEDICAL, FAMILY, AND SOCIAL HISTORY  Past Medical History:  Diagnosis Date   Allergy    Complication of anesthesia    slow to wake up from anesthesia, occ HA with anesthesia    Diabetes mellitus without complication (HCC)    type 2   Difficulty swallowing pills    Dyspraxia    Eczema    Global developmental delay    Obesity (BMI 30-39.9)    PONV (postoperative nausea and vomiting)    Precocious female puberty    Seizures ([place])    last seizure 2012 - no current med    Family History  Problem Relation Age of Onset   Diabetes Mother        steroid induced; hx. colitis   Anesthesia problems Mother        severe  headache lasting 2-3 days   Rheum arthritis Mother    Ulcerative colitis Mother    Fibromyalgia Mother    Hypertension Maternal Grandmother    Hyperlipidemia Maternal Grandmother    Diabetes Maternal Grandmother    Kidney disease Maternal Grandfather    Hypertension Maternal Grandfather    Heart disease Maternal Grandfather    Hyperlipidemia Maternal Grandfather    Kidney disease Paternal Grandmother    Hypertension Paternal Grandmother    Diabetes Paternal Grandmother    Heart disease Paternal Grandmother    Hyperlipidemia Paternal Grandmother    Hypertension Paternal Grandfather    Hyperlipidemia Paternal Grandfather    Henoch-Schonlein purpura Brother        in  remission   Asthma Maternal Aunt    Seizures Maternal Uncle      Current Outpatient Medications:    ACCU-CHEK GUIDE test strip, USE TO TEST BLOOD SUGAR LEVELS 4 TIMES DAILY, Disp: 150 strip, Rfl: 5   Accu-Chek Softclix Lancets lancets, Use to test up to 6x daily, Disp: 200 each, Rfl: 5   blood glucose meter kit and supplies, Dispense based on Levine and insurance preference. Use up to four times daily as directed. (FOR ICD-10 E10.9, E11.9)., Disp: 1 each, Rfl: 0   Blood Glucose Monitoring Suppl (ONETOUCH VERIO) w/Device KIT, 1 kit by Does not apply route as directed. Use glucometer to check sugar up to 3x per day (E11.9), Disp: 1 kit, Rfl: 3   cetirizine (ZYRTEC) 10 MG tablet, Take 10 mg by mouth as needed., Disp: , Rfl:    cholecalciferol (VITAMIN D3) 25 MCG (1000 UNIT) tablet, Take 1,000 Units by mouth daily., Disp: , Rfl:    diclofenac Sodium (VOLTAREN) 1 % GEL, Apply to right foot before work and after work for 60 days., Disp: 350 g, Rfl: 1   fluticasone (FLONASE) 50 MCG/ACT nasal spray, Place 2 sprays into both nostrils daily., Disp: 16 g, Rfl: 6   Menaquinone-7 (VITAMIN K2) 100 MCG CAPS, Take 100 mcg by mouth daily., Disp: , Rfl:    Multiple Vitamins-Minerals (MULTIVITAMIN ADULT) CHEW, Chew 1 tablet by mouth daily., Disp: , Rfl:    nabumetone (RELAFEN) 500 MG tablet, Take 1 tablet (500 mg total) by mouth daily., Disp: 30 tablet, Rfl: 0   olopatadine (PATANOL) 0.1 % ophthalmic solution, Place 1 drop into both eyes 2 (two) times daily as needed for allergies., Disp: , Rfl:    tirzepatide (MOUNJARO) 2.5 MG/0.5ML Pen, Inject 2.5 mg into the skin once a week., Disp: 2 mL, Rfl: 5   azelastine (ASTELIN) 0.1 % nasal spray, Place 1 spray into both nostrils 2 (two) times daily. Use in each nostril as directed (Levine not taking: Reported on 11/13/2020), Disp: 30 mL, Rfl: 12   chlorhexidine (HIBICLENS) 4 % external liquid, Apply topically daily as needed. (Levine not taking: Reported on 11/13/2020),  Disp: 120 mL, Rfl: 0   Continuous Blood Gluc Receiver ([place]) DEVI, 1 Device by Does not apply route as directed. (Levine not taking: No sig reported), Disp: 1 each, Rfl: 2   meloxicam (MOBIC) 15 MG tablet, Take 1 tablet (15 mg total) by mouth daily. Take with food. (Levine not taking: Reported on 11/13/2020), Disp: 30 tablet, Rfl: 0  Allergies as of 11/13/2020   (No Known Allergies)     reports that she has never smoked. She has never used smokeless tobacco. She reports that she does not drink alcohol and does not use drugs. Pediatric History  Levine Parents  Alfred Levins (Mother)   Tharon Aquas Jr,Clifton (Father)   Other Topics Concern   Not on file  Social History Narrative   Sharla is graduated. [name] doing average.    She enjoys playing softball and basketball.   Lives with her parents.    Volunteers Monday- Thursday at [employer] and [employer] .    Left handed   Two story home      Had surgery on her legs in Feb and is physical therapy twice a week.   Working at [employer] and at [employer]. Mom makes her keep a budget and pay her own bills.   Primary Care Provider: [name], MD     Objective:  Objective   Vital Signs:      07/24/2020  BP 110/70  Pulse 84  Weight 215 lb 9.6 oz  BMI (Calculated) 36.99    BP 122/84   Pulse 88   Ht 5' 4.17" (1.63 m)   Wt 217 lb (98.4 kg)   BMI 37.05 kg/m  Growth percentile SmartLinks can only be used for patients less than 5 years old.    Ht Readings from Last 3 Encounters:  11/13/20 5' 4.17" (1.63 m)  08/20/20 _0  (1.626 m)  07/16/20 _1  (1.626 m)   Wt Readings from Last 3 Encounters:  11/13/20 217 lb (98.4 kg)  08/26/20 218 lb 3.2 oz (99 kg)  08/20/20 217 lb 6.4 oz (98.6 kg)   HC Readings from Last 3 Encounters:  No data found for [facility]   Body surface area is 2.11 meters squared. Facility age limit for growth percentiles is 20 years. Facility age limit for growth percentiles is 20  years.   PHYSICAL EXAM:    Constitutional: The Levine appears healthy and well nourished. The Levine's height and weight are advanced for age.  Weight is essentially stable since last visit.  Head: The head is normocephalic. Face: The face appears normal. There are no obvious dysmorphic features. Eyes: The eyes appear to be normally formed and spaced. Gaze is conjugate. There is no obvious arcus or proptosis. Moisture appears normal. Ears: The ears are normally placed and appear externally normal. Mouth: The oropharynx and tongue appear normal. Dentition appears to be normal for age. Oral moisture is normal. Neck: The neck appears to be visibly normal. The thyroid gland is 13 grams in size. The consistency of the thyroid gland is normal. The thyroid gland is not tender to palpation. +2 acanthosis with thick scaling Lungs: no increased work of breathing Heart: regular pulses and peripheral perfusion Abdomen: The abdomen appears to be enlarged in size for the Levine's age. There is no obvious hepatomegaly, splenomegaly, or other mass effect.  Arms: Muscle size and bulk are normal for age. Axillary acanthosis and hydradenitis (scarring) Hands: There is no obvious tremor. Phalangeal and metacarpophalangeal joints are normal. Palmar muscles are normal for age. Palmar skin is normal. Palmar moisture is also normal. Legs: Muscles appear normal for age. No edema is present. Neurologic: Strength is normal for age in both the upper and lower extremities. Muscle tone is normal. Sensation to touch is normal in both the legs and feet.   GYN/GU: normal female   LAB DATA:    Lab Results  Component Value Date   HGBA1C 6.3 (A) 11/13/2020   HGBA1C 6.3 (A) 07/24/2020   HGBA1C 6.2 04/24/2020   HGBA1C 6.6 (A) 12/11/2019   HGBA1C 6.3 (A) 09/20/2019   HGBA1C >14 05/17/2019   HGBA1C 6.6 (A) 12/29/2018  HGBA1C 6.7 (H) 11/15/2018     Results for orders placed or performed in visit on 11/13/20  POCT  Glucose (Device for Home Use)  Result Value Ref Range   Glucose Fasting, POC     POC Glucose 209 (A) 70 - 99 mg/dl  POCT glycosylated hemoglobin (Hb A1C)  Result Value Ref Range   Hemoglobin A1C 6.3 (A) 4.0 - 5.6 %   HbA1c POC (<> result, manual entry)     HbA1c, POC (prediabetic range)     HbA1c, POC (controlled diabetic range)           Assessment and Plan:  Assessment  ASSESSMENT:   Kaleeya is a 20 y.o. AA female with history of precocious puberty (which increases risk of J5TS, PCOS, Metabolic syndrome) who now meets criteria for type 2 diabetes    Type 2 diabetes  - Diagnosed in January 2021 - Has been working closely with Dr. Lovena Le to improve glycemic control - Currently having issues with Dexcom - A1C is unchanged - Will transition from Ozempic to [facility] 2.5 mg.   PLAN:   1. Diagnostic: A1C as above  2. Therapeutic: Start Mounjaro 2.5 mg weekly  3. Levine education: Lengthy discussion as above.  4. Follow-up: Return in about 3 months (around 02/12/2021).      Lelon Huh, MD  Level of Service:  >30 minutes spent today reviewing the medical chart, counseling the Levine/family, and documenting today's encounter.

## 2020-11-13 NOTE — Telephone Encounter (Signed)
Called pharmacy to verify prescription benefits   RxBIN [id number] RxPCN Tressa Busman [employer] RLCORP ID [id number] Pharmacy line phone number: [phone]  Thank you for involving clinical pharmacist/diabetes educator to assist in providing this patient's care.   Drexel Iha, PharmD, BCACP, [place], CPP

## 2020-11-13 NOTE — Telephone Encounter (Signed)
Checked covermymeds on 11/13/20.  Prior authorization approved from 11/13/20 - 05/13/21.    Called mom to provide status update. Provided information about copay card (cost should be $25/30 day supply) and instructions how to obtain copay card (mom wrote down). She denied training (pt previously on Trulicity so she is familiar how to use). Mother is aware last time [place] said Trulicity was painful and [name] has confirmed she is willing to retry autoinjecto device. She was appreciative for the call.   Thank you for involving clinical pharmacist/diabetes educator to assist in providing this patient's care.   Drexel Iha, PharmD, BCACP, [place], CPP

## 2020-11-14 ENCOUNTER — Encounter: Payer: Self-pay | Admitting: Podiatry

## 2020-11-14 ENCOUNTER — Ambulatory Visit (INDEPENDENT_AMBULATORY_CARE_PROVIDER_SITE_OTHER): Payer: 59 | Admitting: Podiatry

## 2020-11-14 DIAGNOSIS — M79674 Pain in right toe(s): Secondary | ICD-10-CM | POA: Diagnosis not present

## 2020-11-14 DIAGNOSIS — B351 Tinea unguium: Secondary | ICD-10-CM

## 2020-11-14 DIAGNOSIS — M79675 Pain in left toe(s): Secondary | ICD-10-CM

## 2020-11-14 DIAGNOSIS — E119 Type 2 diabetes mellitus without complications: Secondary | ICD-10-CM

## 2020-11-19 NOTE — Progress Notes (Signed)
  Subjective:  Patient ID: Deanna Levine, female    DOB: 15-May-2000,  MRN: [id number]  20 y.o. female presents with preventative diabetic foot care and painful thick toenails that are difficult to trim. Pain interferes with ambulation. Aggravating factors include wearing enclosed shoe gear. Pain is relieved with periodic professional debridement..    Patient's blood sugar was 209 mg/dl on yesterday.  Patient did not check blood glucose this morning.  Patient's mom, Deanna Levine, would like to have MRI or ultrasound performed on Deanna Levine's right foot/ankle for chronic pain. Mom is also interested in Deanna Levine having her great toenails removed.  Mom states [facility] orthotist recommends adding an extensor lateral buttress to Deanna Levine's shoe if high top shoe does not work.  PCP: Deanna Koch, MD and last visit was: 07/02/2020.  Review of Systems: Negative except as noted in the HPI.   No Known Allergies  Objective:  There were no vitals filed for this visit. Constitutional Patient is a pleasant 20 y.o. African American female obese in NAD. AAO x 3.  Vascular Capillary fill time to digits immediate b/l.  DP/PT pulse(s) are palpable b/l lower extremities. Pedal hair spresent. Lower extremity skin temperature gradient within normal limits. No pain with calf compression b/l. No edema noted b/l lower extremities. No cyanosis or clubbing noted.   Neurologic Protective sensation intact 5/5 intact bilaterally with 10g monofilament b/l. Vibratory sensation intact b/l. No clonus b/l.   Dermatologic Pedal skin is warm and supple b/l.  No open wounds b/l lower extremities. No interdigital macerations b/l lower extremities. Toenails b/l great toes elongated, discolored, dystrophic, thickened, crumbly with subungual debris and tenderness to dorsal palpation. Well healed surgical scars from bilateral tendon releases.  Orthopedic: Normal muscle strength 5/5 to all lower extremity muscle groups  bilaterally. Patient ambulates independent of any assistive aids. Pes planus deformity noted b/l lower extremities.   Hemoglobin A1C Latest Ref Rng & Units 11/13/2020 07/24/2020 04/24/2020 12/11/2019  HGBA1C 4.0 - 5.6 % 6.3(A) 6.3(A) 6.2 6.6(A)  Some recent data might be hidden   Assessment:   1. Pain due to onychomycosis of toenails of both feet   2. Type 2 diabetes mellitus without complication, without long-term current use of insulin (Deanna Levine)    Plan:  Patient was evaluated and treated and all questions answered. Consent given for treatment as described below: -Examined patient. -She has been seen at [facility] and orthotist recommends adding an extensor lateral buttress to the shoe if the high top shoe does not improve her symptoms. -Mom is requesting MRI or ultrasound of the RLE and I will pass this along to Deanna Levine when he returns to the office. Also informed Mother if she is interested in having bilateral great toenails removed, she may discuss with Deanna Levine as well. -Continue diabetic foot care principles: inspect feet daily, monitor glucose as recommended by PCP and/or Endocrinologist, and follow prescribed diet per PCP, Endocrinologist and/or dietician. -Patient to continue soft, supportive shoe gear daily. -Toenails L hallux and R hallux debrided in length and girth without iatrogenic bleeding with sterile nail nipper and dremel.  -Patient to report any pedal injuries to medical professional immediately. -Patient/POA to call should there be question/concern in the interim.  Return in about 3 months (around 02/13/2021).  Deanna Levine, DPM

## 2020-11-20 ENCOUNTER — Encounter: Payer: Self-pay | Admitting: Podiatry

## 2020-11-20 ENCOUNTER — Telehealth: Payer: Self-pay | Admitting: Pulmonary Disease

## 2020-11-20 NOTE — Telephone Encounter (Signed)
I called and spoke with Annetta, mom on the DPR, regarding message. [name] wanting the HST results done on 10/27/2020. I will route to Dr. Erin Fulling for recs.  Dr. Erin Fulling, please advise on HST recs. Thanks!

## 2020-11-21 NOTE — Telephone Encounter (Signed)
Patient has mild obstructive sleep apnea based on her recent report. Conservative treatment would involve weight loss. If she would like to meet with one of our sleep physicians to discuss CPAP therapy, then please setup a visit for her.   Thanks, Wille Glaser

## 2020-11-21 NOTE — Telephone Encounter (Signed)
Discussed with Dr Elisha Ponder, MRI has been ordered by Dr Valentina Lucks

## 2020-11-21 NOTE — Telephone Encounter (Signed)
Spoke with the pt's mother ok per DPR and made her aware of response per Dr Erin Fulling. She verbalized understanding. Will discuss with the pt. She states pt is getting ready to start a wt loss program. She will call back to make appt with sleep doc if pt wants to try CPAP. Nothing further needed.

## 2020-11-23 ENCOUNTER — Encounter (HOSPITAL_BASED_OUTPATIENT_CLINIC_OR_DEPARTMENT_OTHER): Payer: 59 | Admitting: Pulmonary Disease

## 2020-11-25 ENCOUNTER — Encounter: Payer: Self-pay | Admitting: Podiatry

## 2020-11-25 ENCOUNTER — Ambulatory Visit (INDEPENDENT_AMBULATORY_CARE_PROVIDER_SITE_OTHER): Payer: 59 | Admitting: Podiatry

## 2020-11-25 ENCOUNTER — Other Ambulatory Visit: Payer: Self-pay

## 2020-11-25 DIAGNOSIS — M216X9 Other acquired deformities of unspecified foot: Secondary | ICD-10-CM

## 2020-11-25 DIAGNOSIS — M24573 Contracture, unspecified ankle: Secondary | ICD-10-CM | POA: Diagnosis not present

## 2020-11-25 NOTE — Progress Notes (Signed)
  Subjective:  Patient ID: Deanna Levine, female    DOB: 2001-02-20,  MRN: [id number]  Chief Complaint  Patient presents with   Tendonitis      tendonitis f/u     20 y.o. female presents with the above complaint. History confirmed with patient.  [name] here with her parents today.  She does have some developmental delay.  She was diagnosed recently with White-Sutton Syndrome, this is a very rare genetic disorder that predisposes her to ligamentous laxity and tendon abnormalities.  She had flaccidity of the tendons when she was younger according to her parents and this has become increasingly spastic and tight as she has aged.  She underwent gastrocnemius recession with Dr. Sharol Given to last spring and unfortunately has tightened up since then.   Objective:  Physical Exam: warm, good capillary refill, normal DP and PT pulses, and normal sensory exam.  She has significant equinus I am barely able to get her past neutral.  Does not improve much with knee flexion.  She has pain over the TA tendon and dorsal midfoot.  On weightbearing she has uncompensated forefoot varus   Radiographs: Multiple views x-ray of the left foot previously taken: no fracture, dislocation, swelling or degenerative changes noted Assessment:   1. Equinus contracture of ankle   2. Acquired forefoot varus, unspecified laterality      Plan:  Patient was evaluated and treated and all questions answered.  Reviewed the radiographic exam she is previous undergone as well as my clinical exam with the patient and her mother and father.  I discussed with him that she does have quite a bit of spasticity and equinus in the posterior compartment that I think has returned since her surgery.  I think most of the pain she is having is substitution of the extensor tendons trying to find her equinus that she is not able to adequately dorsiflex.  Additionally she has uncompensated forefoot varus.  She has discussed molded orthoses from  [place] [facility] and I wrote her a prescription to be added today a lateral extensile buttress to prevent varus rotation.  Additionally at least for temporary relief dispensed to Tri-Lock ankle braces for ankle stability and this may offer her some support as well.  I think she likely needs an AFO that will help her in dorsiflexion assist.  Beyond this Achilles tendon lengthening or advancement as well as possible split tibialis anterior tendon transfer may be beneficial but would prefer avoiding any reconstructive surgery at this point.  She return to see me after the MRIs completed that has been ordered to evaluate her forefoot pain and once the orthotics and brace have been made we will see how she is doing  Return in about 3 weeks (around 12/16/2020) for after MRI to review.

## 2020-11-27 NOTE — Progress Notes (Signed)
Deanna, Levine ([id number]) Visit Report for 07/30/2020 Arrival Information Details Patient Name: Date of Service: Deanna, Levine 07/30/2020 9:00 A [place] Medical Record Number: [id number] Patient Account Number: [id number] Date of Birth/Sex: Treating RN: 05/06/2000 (20 y.o. Deanna Levine, Deanna Levine Primary Care Dashawn Golda: Pricilla Holm Other Clinician: Referring Reiko Vinje: Treating Deanna Levine/Extender: Dreama Saa in Treatment: 8 Visit Information History Since Last Visit Added or deleted any medications: No Patient Arrived: Ambulatory Any new allergies or adverse reactions: No Arrival Time: 09:27 Had a fall or experienced change in No Accompanied By: mother activities of daily living that may affect Transfer Assistance: None risk of falls: Patient Identification Verified: Yes Signs or symptoms of abuse/neglect since last visito No Secondary Verification Process Completed: Yes Hospitalized since last visit: No Patient Requires Transmission-Based Precautions: No Implantable device outside of the clinic excluding No Patient Has Alerts: No cellular tissue based products placed in the center since last visit: Has Dressing in Place as Prescribed: Yes Pain Present Now: No Electronic Signature(s) Signed: 11/27/2020 4:51:55 PM By: Levan Hurst RN, BSN Entered By: Levan Hurst on 07/30/2020 09:27:54 -------------------------------------------------------------------------------- Clinic Level of Care Assessment Details Patient Name: Date of Service: Deanna Levine 07/30/2020 9:00 [place] Record Number: [id number] Patient Account Number: [id number] Date of Birth/Sex: Treating RN: Jul 03, 2000 (20 y.o. Deanna Levine Primary Care Onie Hayashi: Pricilla Holm Other Clinician: Referring Ziva Nunziata: Treating Deanna Levine/Extender: Dreama Saa in Treatment: 8 Clinic Level of Care Assessment Items TOOL 4 Quantity Score _0  - 0 Use  when only an EandM is performed on FOLLOW-UP visit ASSESSMENTS - Nursing Assessment / Reassessment X- 1 10 Reassessment of Co-morbidities (includes updates in patient status) X- 1 5 Reassessment of Adherence to Treatment Plan ASSESSMENTS - Wound and Skin A ssessment / Reassessment _1  - 0 Simple Wound Assessment / Reassessment - one wound X- 3 5 Complex Wound Assessment / Reassessment - multiple wounds _2  - 0 Dermatologic / Skin Assessment (not related to wound area) ASSESSMENTS - Focused Assessment _3  - 0 Circumferential Edema Measurements - multi extremities _4  - 0 Nutritional Assessment / Counseling / Intervention _5  - 0 Lower Extremity Assessment (monofilament, tuning fork, pulses) _6  - 0 Peripheral Arterial Disease Assessment (using hand held doppler) ASSESSMENTS - Ostomy and/or Continence Assessment and Care _7  - 0 Incontinence Assessment and Management _8  - 0 Ostomy Care Assessment and Management (repouching, etc.) PROCESS - Coordination of Care X - Simple Patient / Family Education for ongoing care 1 15 _9  - 0 Complex (extensive) Patient / Family Education for ongoing care X- 1 10 Staff obtains Programmer, systems, Records, T Results / Process Orders est _10  - 0 Staff telephones HHA, Nursing Homes / Clarify orders / etc _11  - 0 Routine Transfer to another Facility (non-emergent condition) _12  - 0 Routine Hospital Admission (non-emergent condition) _13  - 0 New Admissions / Biomedical engineer / Ordering NPWT Apligraf, etc. , _14  - 0 Emergency Hospital Admission (emergent condition) X- 1 10 Simple Discharge Coordination _15  - 0 Complex (extensive) Discharge Coordination PROCESS - Special Needs _16  - 0 Pediatric / Minor Patient Management _17  - 0 Isolation Patient Management _18  - 0 Hearing / Language / Visual special needs _19  - 0 Assessment of Community assistance (transportation, D/C planning, etc.) _20  - 0 Additional assistance / Altered mentation _21  - 0 Support  Surface(s) Assessment (bed, cushion, seat, etc.) INTERVENTIONS - Wound Cleansing / Measurement _22  - 0 Simple Wound Cleansing - one wound X- 3 5 Complex Wound Cleansing - multiple wounds X-  1 5 Wound Imaging (photographs - any number of wounds) _0  - 0 Wound Tracing (instead of photographs) _1  - 0 Simple Wound Measurement - one wound X- 3 5 Complex Wound Measurement - multiple wounds INTERVENTIONS - Wound Dressings X - Small Wound Dressing one or multiple wounds 3 10 _2  - 0 Medium Wound Dressing one or multiple wounds _3  - 0 Large Wound Dressing one or multiple wounds X- 1 5 Application of Medications - topical <[id number]>_3<\/[id number]>_2  - 0 Application of Medications - injection INTERVENTIONS - Miscellaneous _5  - 0 External ear exam _6  - 0 Specimen Collection (cultures, biopsies, blood, body fluids, etc.) _7  - 0 Specimen(s) / Culture(s) sent or taken to Lab for analysis _8  - 0 Patient Transfer (multiple staff / Civil Service fast streamer / Similar devices) _9  - 0 Simple Staple / Suture removal (25 or less) _10  - 0 Complex Staple / Suture removal (26 or more) _11  - 0 Hypo / Hyperglycemic Management (close monitor of Blood Glucose) _12  - 0 Ankle / Brachial Index (ABI) - do not check if billed separately X- 1 5 Vital Signs Has the patient been seen at the hospital within the last three years: Yes Total Score: 140 Level Of Care: New/Established - Level 4 Electronic Signature(s) Signed: 07/30/2020 6:06:42 PM By: Baruch Gouty RN, BSN Entered By: Baruch Gouty on 07/30/2020 09:42:51 -------------------------------------------------------------------------------- Encounter Discharge Information Details Patient Name: Date of Service: Deanna, Levine 07/30/2020 9:00 [place] Record Number: [id number] Patient Account Number: [id number] Date of Birth/Sex: Treating RN: 07/10/2000 (20 y.o. Tonita Phoenix, Lauren Primary Care Peniel Biel: Pricilla Holm Other Clinician: Referring Tiasha Helvie: Treating  Eular Panek/Extender: Dreama Saa in Treatment: 8 Encounter Discharge Information Items Discharge Condition: Stable Ambulatory Status: Ambulatory Discharge Destination: Home Transportation: Private Auto Accompanied ByCandi Leash Schedule Follow-up Appointment: Yes Clinical Summary of Care: Patient Declined Electronic Signature(s) Signed: 07/31/2020 12:00:56 PM By: Rhae Hammock RN Entered By: Rhae Hammock on 07/30/2020 10:00:17 -------------------------------------------------------------------------------- [place] Details Patient Name: Date of Service: TURKESSA, OSTROM 07/30/2020 9:00 [place] Record Number: [id number] Patient Account Number: [id number] Date of Birth/Sex: Treating RN: [birth year] (19 y.o. Deanna Levine Primary Care Eurika Sandy: Pricilla Holm Other Clinician: Referring Libra Gatz: Treating Kavita Bartl/Extender: Dreama Saa in Treatment: [place] reviewed with physician Active Inactive Wound/Skin Impairment Nursing Diagnoses: Impaired tissue integrity Knowledge deficit related to ulceration/compromised skin integrity Goals: Patient/caregiver will verbalize understanding of skin care regimen Date Initiated: 06/04/2020 Target Resolution Date: 08/27/2020 Goal Status: Active Ulcer/skin breakdown will have a volume reduction of 30% by week 4 Date Initiated: 06/04/2020 Date Inactivated: 07/02/2020 Target Resolution Date: 07/02/2020 Goal Status: Met Ulcer/skin breakdown will have a volume reduction of 50% by week 8 Date Initiated: 07/02/2020 Target Resolution Date: 08/27/2020 Goal Status: Active Interventions: Assess patient/caregiver ability to obtain necessary supplies Assess patient/caregiver ability to perform ulcer/skin care regimen upon admission and as needed Assess ulceration(s) every visit Provide education on ulcer and skin care Treatment  Activities: Topical wound management initiated : 06/04/2020 Notes: Electronic Signature(s) Signed: 07/30/2020 6:06:42 PM By: Baruch Gouty RN, BSN Entered By: Baruch Gouty on 07/30/2020 09:37:44 -------------------------------------------------------------------------------- Pain Assessment Details Patient Name: Date of Service: Deanna, Levine 07/30/2020 9:00 [place] Record Number: [id number] Patient Account Number: [id number] Date of Birth/Sex: Treating RN: 03/14/2000 (20 y.o. Nancy Fetter Primary Care Kalyse Meharg: Pricilla Holm Other Clinician: Referring Juliette Standre: Treating Yancey Pedley/Extender: Dreama Saa in Treatment: 8 Active Problems Location of Pain Severity and Description of  Pain Patient Has Paino No Site Locations Pain Management and Medication Current Pain Management: Electronic Signature(s) Signed: 11/27/2020 4:51:55 PM By: Levan Hurst RN, BSN Entered By: Levan Hurst on 07/30/2020 09:28:27 -------------------------------------------------------------------------------- Patient/Caregiver Education Details Patient Name: Date of Service: Deanna Levine 6/1/2022andnbsp9:00 [place] Record Number: [id number] Patient Account Number: [id number] Date of Birth/Gender: Treating RN: [birth year] (20 y.o. Deanna Levine Primary Care Physician: Pricilla Holm Other Clinician: Referring Physician: Treating Physician/Extender: Dreama Saa in Treatment: 8 Education Assessment Education Provided To: Patient Education Topics Provided Wound/Skin Impairment: Methods: Explain/Verbal Responses: Reinforcements needed, State content correctly Electronic Signature(s) Signed: 07/30/2020 6:06:42 PM By: Baruch Gouty RN, BSN Entered By: Baruch Gouty on 07/30/2020 09:38:10 -------------------------------------------------------------------------------- Wound Assessment Details Patient  Name: Date of Service: Deanna, Levine 07/30/2020 9:00 [place] Record Number: [id number] Patient Account Number: [id number] Date of Birth/Sex: Treating RN: Dec 19, 2000 (20 y.o. Nancy Fetter Primary Care Arleigh Dicola: Pricilla Holm Other Clinician: Referring Cyara Devoto: Treating Manpreet Kemmer/Extender: Dreama Saa in Treatment: 8 Wound Status Wound Number: 1 Primary Hidradenitis Etiology: Wound Location: Right, Proximal Breast Wound Open Wounding Event: Gradually Appeared Status: Date Acquired: 11/30/2019 Comorbid Chronic sinus problems/congestion, Type II Diabetes, Weeks Of Treatment: 8 History: Osteoarthritis, Seizure Disorder Clustered Wound: No Photos Wound Measurements Length: (cm) 0.2 Width: (cm) 0.2 Depth: (cm) 0.1 Area: (cm) 0.031 Volume: (cm) 0.003 % Reduction in Area: 34% % Reduction in Volume: 40% Epithelialization: Medium (34-66%) Tunneling: No Undermining: No Wound Description Classification: Full Thickness Without Exposed Support Structures Wound Margin: Distinct, outline attached Exudate Amount: Medium Exudate Type: Serosanguineous Exudate Color: red, brown Foul Odor After Cleansing: No Slough/Fibrino No Wound Bed Granulation Amount: Large (67-100%) Exposed Structure Granulation Quality: Red Fascia Exposed: No Necrotic Amount: None Present (0%) Fat Layer (Subcutaneous Tissue) Exposed: Yes Tendon Exposed: No Muscle Exposed: No Joint Exposed: No Bone Exposed: No Electronic Signature(s) Signed: 07/31/2020 1:41:47 PM By: Sandre Kitty Signed: 11/27/2020 4:51:55 PM By: Levan Hurst RN, BSN Entered By: Sandre Kitty on 07/30/2020 17:04:51 -------------------------------------------------------------------------------- Wound Assessment Details Patient Name: Date of Service: Deanna, Levine 07/30/2020 9:00 A M Medical Record Number: [id number] Patient Account Number: [id number] Date of Birth/Sex: Treating  RN: 08-Apr-2000 (20 y.o. Nancy Fetter Primary Care Quintara Bost: Pricilla Holm Other Clinician: Referring Lashona Schaaf: Treating Deanna Levine/Extender: Dreama Saa in Treatment: 8 Wound Status Wound Number: 3 Primary Hidradenitis Etiology: Wound Location: Left, Medial Breast Wound Healed - Epithelialized Wounding Event: Gradually Appeared Status: Date Acquired: 06/29/2020 Comorbid Chronic sinus problems/congestion, Type II Diabetes, Weeks Of Treatment: 4 History: Osteoarthritis, Seizure Disorder Clustered Wound: No Wound Measurements Length: (cm) Width: (cm) Depth: (cm) Area: (cm) Volume: (cm) 0 % Reduction in Area: 100% 0 % Reduction in Volume: 100% 0 Epithelialization: Large (67-100%) 0 Tunneling: No 0 Undermining: No Wound Description Classification: Full Thickness Without Exposed Support Structures Wound Margin: Flat and Intact Exudate Amount: None Present Foul Odor After Cleansing: No Slough/Fibrino No Wound Bed Granulation Amount: None Present (0%) Exposed Structure Necrotic Amount: None Present (0%) Fascia Exposed: No Fat Layer (Subcutaneous Tissue) Exposed: No Tendon Exposed: No Muscle Exposed: No Joint Exposed: No Bone Exposed: No Electronic Signature(s) Signed: 11/27/2020 4:51:55 PM By: Levan Hurst RN, BSN Entered By: Levan Hurst on 07/30/2020 09:34:16 -------------------------------------------------------------------------------- Wound Assessment Details Patient Name: Date of Service: Deanna, MCCREADY 07/30/2020 9:00 A M Medical Record Number: [id number] Patient Account Number: [id number] Date of Birth/Sex: Treating RN: 05-09-2000 (20 y.o. Nancy Fetter Primary Care Sherilyn Windhorst: Pricilla Holm Other  Clinician: Referring Deanna Levine: Treating Ysabelle Goodroe/Extender: Dreama Saa in Treatment: 8 Wound Status Wound Number: 4 Primary Hidradenitis Etiology: Wound Location: Left, Inferior  Breast Wound Open Wounding Event: Gradually Appeared Status: Date Acquired: 07/30/2020 Comorbid Chronic sinus problems/congestion, Type II Diabetes, Weeks Of Treatment: 0 History: Osteoarthritis, Seizure Disorder Clustered Wound: No Photos Wound Measurements Length: (cm) 0.4 Width: (cm) 0.4 Depth: (cm) 0.1 Area: (cm) 0.126 Volume: (cm) 0.013 % Reduction in Area: 0% % Reduction in Volume: 0% Epithelialization: None Tunneling: No Undermining: No Wound Description Classification: Full Thickness With Exposed Support Structures Wound Margin: Flat and Intact Exudate Amount: Small Exudate Type: Serosanguineous Exudate Color: red, brown Foul Odor After Cleansing: No Slough/Fibrino No Wound Bed Granulation Amount: Large (67-100%) Exposed Structure Granulation Quality: Pink, Pale Fascia Exposed: No Necrotic Amount: None Present (0%) Fat Layer (Subcutaneous Tissue) Exposed: Yes Tendon Exposed: No Muscle Exposed: No Joint Exposed: No Bone Exposed: No Electronic Signature(s) Signed: 07/31/2020 1:41:47 PM By: Sandre Kitty Signed: 11/27/2020 4:51:55 PM By: Levan Hurst RN, BSN Entered By: Sandre Kitty on 07/30/2020 17:05:28 -------------------------------------------------------------------------------- [place] Details Patient Name: Date of Service: Deanna, Levine 07/30/2020 9:00 A M Medical Record Number: [id number] Patient Account Number: [id number] Date of Birth/Sex: Treating RN: Aug 26, 2000 (20 y.o. Nancy Fetter Primary Care Zackari Ruane: Pricilla Holm Other Clinician: Referring Deanna Levine: Treating Shareen Capwell/Extender: Dreama Saa in Treatment: 8 Vital Signs Time Taken: 09:27 Temperature (F): 97.6 Height (in): 64 Pulse (bpm): 80 Weight (lbs): 214 Respiratory Rate (breaths/min): 16 Body Mass Index (BMI): 36.7 Blood Pressure (mmHg): 111/76 Capillary Blood Glucose (mg/dl): 120 Reference Range: 80 - 120 mg / dl Notes glucose  per pt report Electronic Signature(s) Signed: 11/27/2020 4:51:55 PM By: Levan Hurst RN, BSN Entered By: Levan Hurst on 07/30/2020 59:09:31

## 2020-12-01 ENCOUNTER — Other Ambulatory Visit: Payer: Self-pay | Admitting: Family

## 2020-12-02 ENCOUNTER — Other Ambulatory Visit: Payer: 59

## 2020-12-05 ENCOUNTER — Other Ambulatory Visit: Payer: Self-pay

## 2020-12-05 ENCOUNTER — Ambulatory Visit
Admission: RE | Admit: 2020-12-05 | Discharge: 2020-12-05 | Disposition: A | Discharge status: Home / Self Care | Payer: 59 | Source: Ambulatory Visit | Attending: Family | Admitting: Family

## 2020-12-05 DIAGNOSIS — S93622A Sprain of tarsometatarsal ligament of left foot, initial encounter: Secondary | ICD-10-CM

## 2020-12-05 IMAGING — MR MR FOOT*L* W/O CM
5 series · 37 of 40 positions shown · non-contrast
Comparison: None.

CLINICAL DATA: Foot trauma, posterior pain

EXAM:
MRI OF THE LEFT FOOT WITHOUT CONTRAST
TECHNIQUE: Multiplanar, multisequence MR imaging of the left hindfoot was
performed. No intravenous contrast was administered.

[Series 3: T2 fat-sat · axial · 3.0mm · 0.50mm/px · z∈[-34,+87]mm · 8 of 32 slices shown (1 of 2)]
[im 1/32]
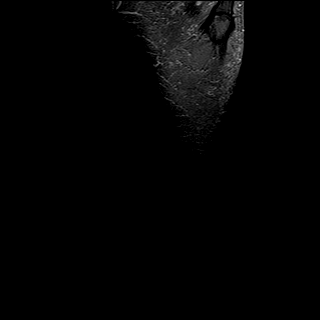
[im 4/32]
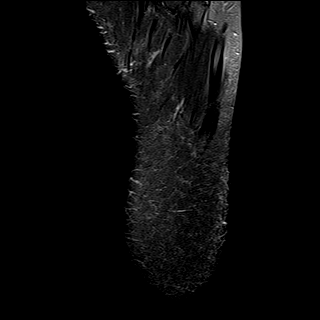
[im 11/32]
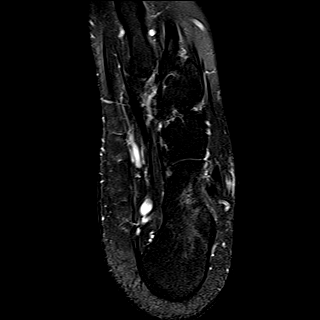
[im 14/32]
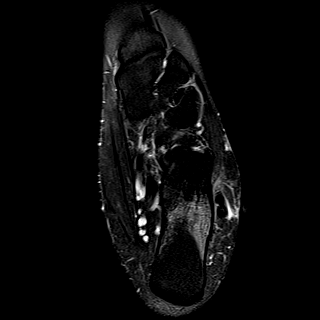
[im 18/32]
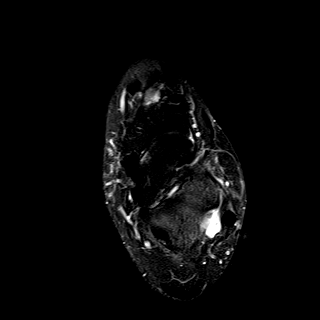
[im 21/32]
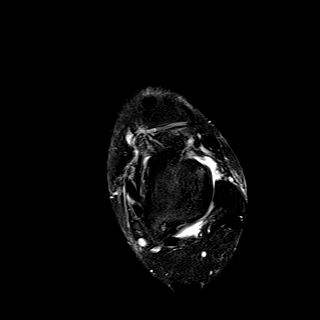
[im 28/32]
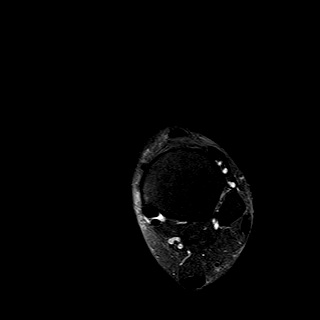
[im 32/32]
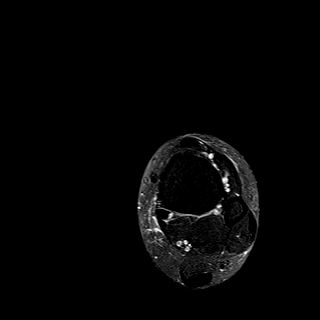

[Series 4: PD fat-sat · axial · 3.0mm · 0.42mm/px · z∈[-38,+91]mm · 9 of 34 slices shown]
[im 1/34]
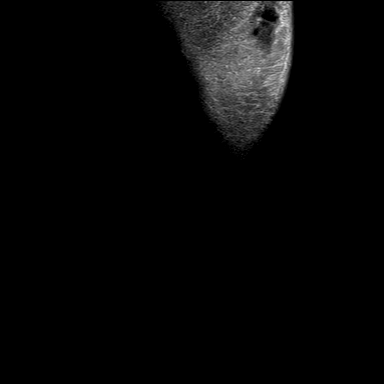
[im 5/34]
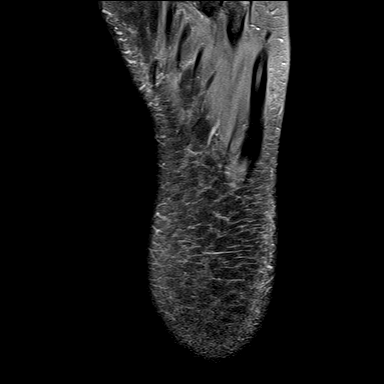
[im 9/34]
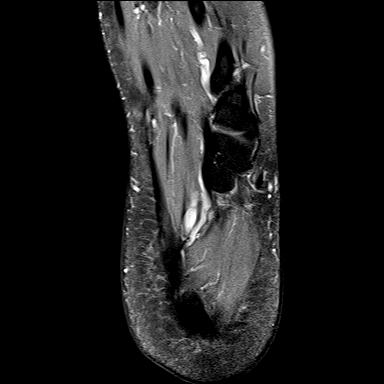
[im 13/34]
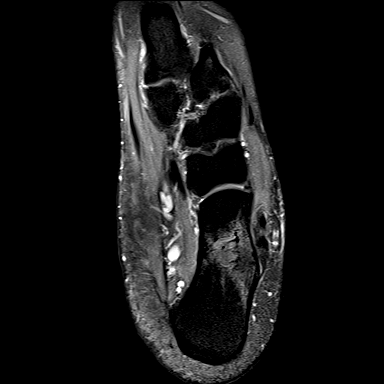
[im 17/34]
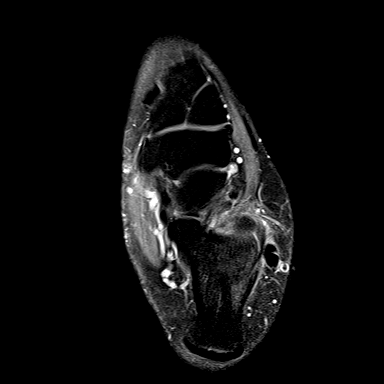
[im 21/34]
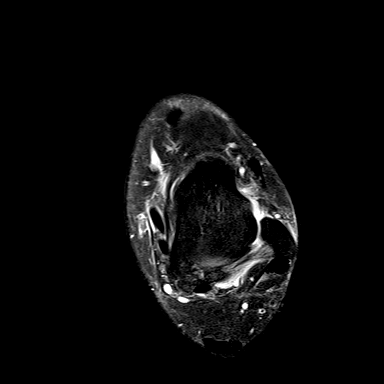
[im 25/34]
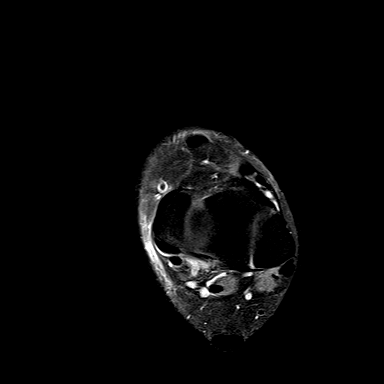
[im 29/34]
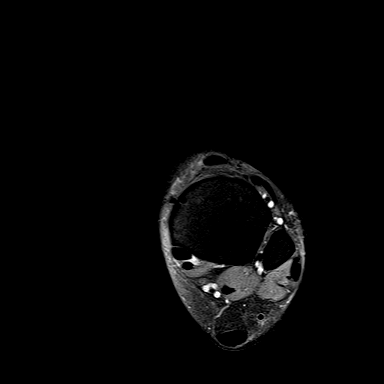
[im 34/34]
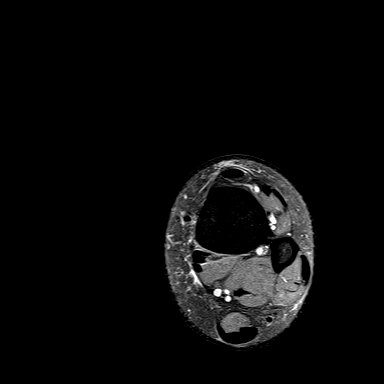

[Series 5: T1 · sagittal · 4.0mm · 0.56mm/px · 5 of 20 slices shown]
[im 1/20]
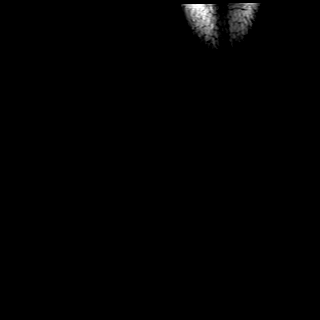
[im 5/20]
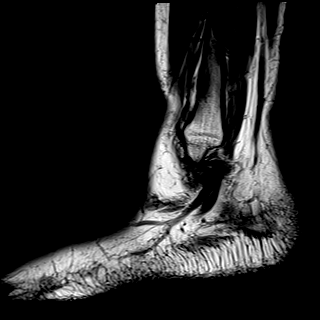
[im 10/20]
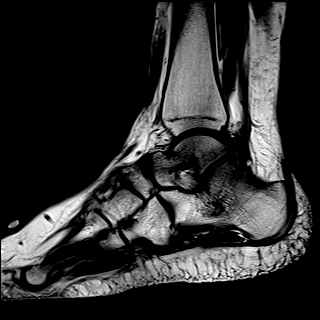
[im 15/20]
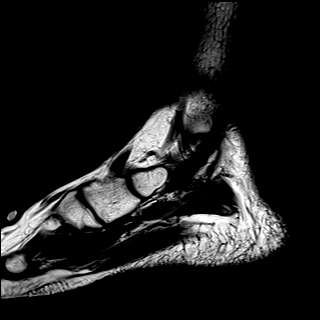
[im 20/20]
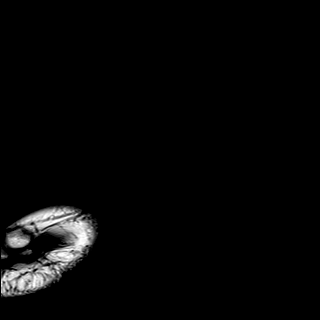

[Series 6: STIR · sagittal · 4.0mm · 0.35mm/px · 4 of 20 slices shown]
[im 1/20]
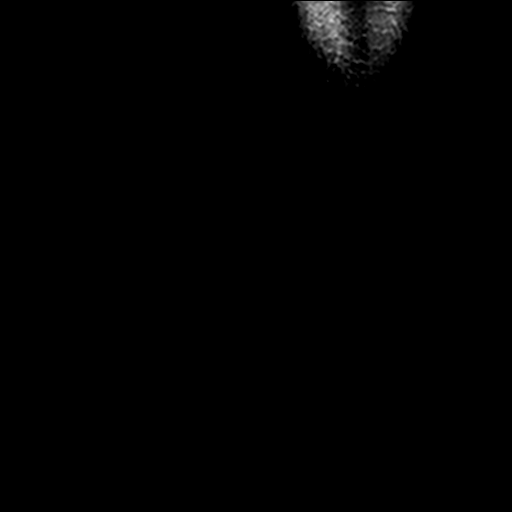
[im 5/20]
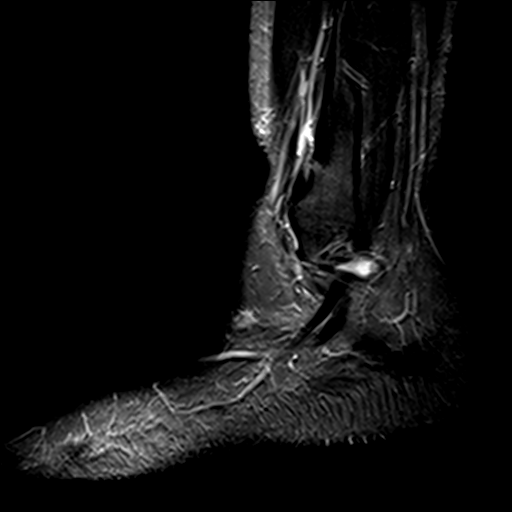
[im 10/20]
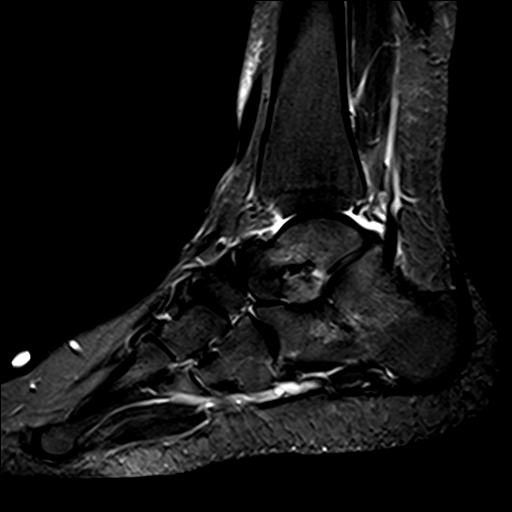
[im 15/20]
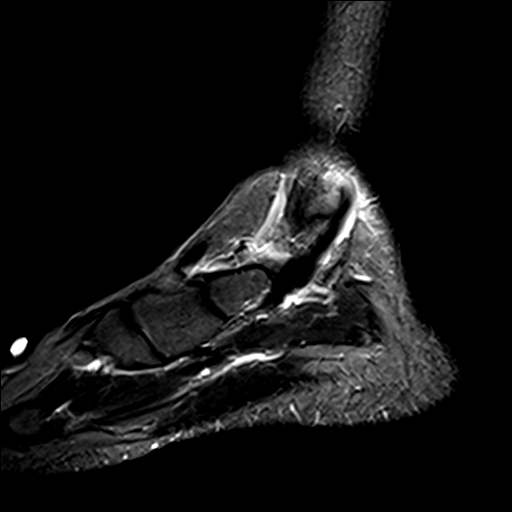

[Series 7: T2 fat-sat · coronal · 3.0mm · 0.50mm/px · 11 of 41 slices shown (2 of 2)]
[im 1/41]
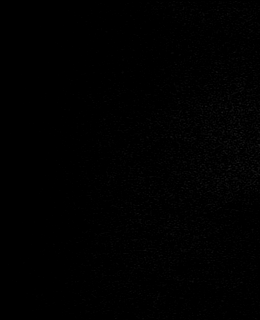
[im 5/41]
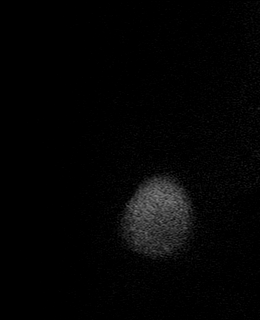
[im 9/41]
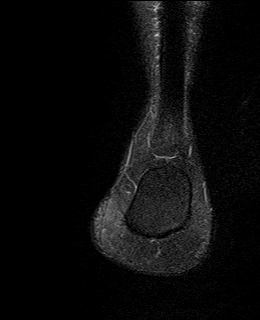
[im 13/41]
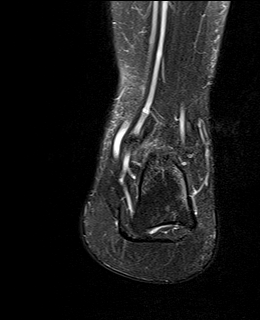
[im 17/41]
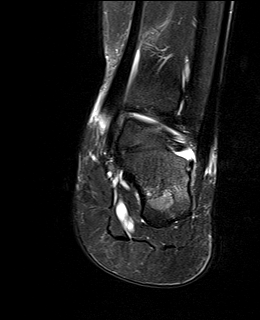
[im 21/41]
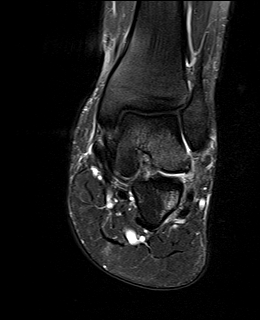
[im 25/41]
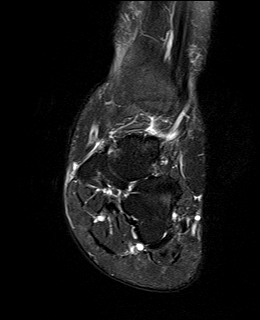
[im 29/41]
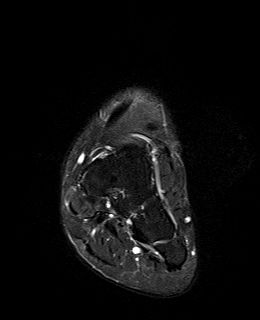
[im 33/41]
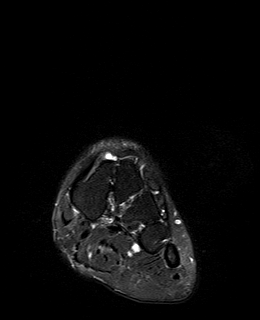
[im 37/41]
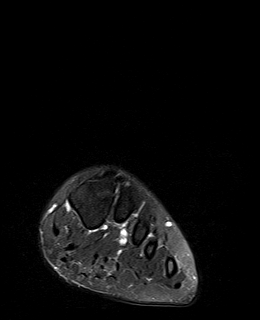
[im 41/41]
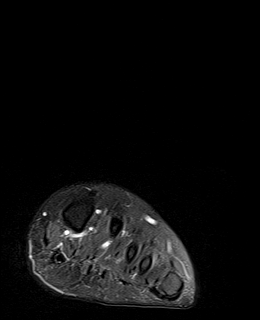

[37 of 40 positions shown; findings below may reference images not displayed]

FINDINGS: TENDONS

Peroneal: Mild tendinosis of the peroneus longus. Peroneal brevis
intact.

Posteromedial: Mild tendinosis of the posterior tibial tendon with
mild tenosynovitis and subcortical marrow edema in the posterior
aspect of the medial malleolus. Flexor hallucis longus tendon
intact. Flexor digitorum longus tendon intact.

Anterior: Tibialis anterior tendon intact. Extensor hallucis longus
tendon intact Extensor digitorum longus tendon intact.

Achilles:  Intact.

Plantar Fascia: Intact.

LIGAMENTS

Lateral: Anterior talofibular ligament intact. Calcaneofibular
ligament intact. Posterior talofibular ligament intact. Anterior and
posterior tibiofibular ligaments intact.

Medial: Deltoid ligament intact. Spring ligament intact.

CARTILAGE

Ankle Joint: Small joint effusion. Normal ankle mortise. No chondral
defect.

Subtalar Joints/Sinus Tarsi: Fibro-cartilaginous coalition across
the middle subtalar joint. No subtalar joint effusion. Normal sinus
tarsi.

Bones: Bone marrow edema in the neck of the talus and the mid
calcaneus likely reflects stress related changes. No fracture or
dislocation.

Soft Tissue: No fluid collection or hematoma. Muscles are normal
without edema or atrophy. Tarsal tunnel is normal.
IMPRESSION: 1. Fibro-cartilaginous coalition across the middle subtalar joint.
2. Bone marrow edema in the neck of the talus and the mid calcaneus
likely reflects stress related changes.
3. Mild tendinosis of the peroneus longus.
4. Mild tendinosis of the posterior tibial tendon with mild
tenosynovitis and subcortical marrow edema in the posterior aspect
of the medial malleolus.

## 2020-12-08 ENCOUNTER — Encounter: Payer: Self-pay | Admitting: Podiatry

## 2020-12-08 ENCOUNTER — Other Ambulatory Visit: Payer: 59

## 2020-12-16 ENCOUNTER — Ambulatory Visit (INDEPENDENT_AMBULATORY_CARE_PROVIDER_SITE_OTHER): Payer: 59 | Admitting: Pharmacist

## 2020-12-18 ENCOUNTER — Other Ambulatory Visit: Payer: Self-pay

## 2020-12-18 ENCOUNTER — Encounter: Payer: Self-pay | Admitting: Podiatry

## 2020-12-18 ENCOUNTER — Ambulatory Visit (INDEPENDENT_AMBULATORY_CARE_PROVIDER_SITE_OTHER): Payer: 59 | Admitting: Podiatry

## 2020-12-18 DIAGNOSIS — R2689 Other abnormalities of gait and mobility: Secondary | ICD-10-CM

## 2020-12-18 DIAGNOSIS — Q6689 Other  specified congenital deformities of feet: Secondary | ICD-10-CM | POA: Diagnosis not present

## 2020-12-18 MED ORDER — ACETAMINOPHEN 160 MG/5ML PO LIQD: 640.0000 mg | Freq: Four times a day (QID) | ORAL | 0 refills | Status: DC | PRN

## 2020-12-18 MED ORDER — OXYCODONE HCL 5 MG/5ML PO SOLN: 5.0000 mg | Freq: Every day | ORAL | 0 refills | Status: DC

## 2020-12-18 MED ORDER — GABAPENTIN 300 MG/6ML PO SOLN: 300.0000 mg | Freq: Every day | ORAL | 0 refills | Status: DC

## 2020-12-18 NOTE — Progress Notes (Signed)
Subjective:  Patient ID: Deanna Levine, female    DOB: 02/01/2001,  MRN: [id number]  Chief Complaint  Patient presents with   Tendonitis    MRI results    20 y.o. female pre returns for follow-up sents with the above complaint. History confirmed with patient.  In the orthotics adjusted and was measured for the AFO.  She has not had a chance to wear it yet.  They did complete the MRI.  Objective:  Physical Exam: warm, good capillary refill, normal DP and PT pulses, and normal sensory exam.  She has significant equinus I am barely able to get her past neutral.  Does not improve much with knee flexion.  She has pain over the TA tendon and dorsal midfoot.  On weightbearing she has uncompensated forefoot varus.  Pain over the medial hindfoot as well  MRI 12/07/2020 Study Result  Narrative & Impression  CLINICAL DATA:  Foot trauma, posterior pain   EXAM: MRI OF THE LEFT FOOT WITHOUT CONTRAST   TECHNIQUE: Multiplanar, multisequence MR imaging of the left hindfoot was performed. No intravenous contrast was administered.   COMPARISON:  None.   FINDINGS: TENDONS   Peroneal: Mild tendinosis of the peroneus longus. Peroneal brevis intact.   Posteromedial: Mild tendinosis of the posterior tibial tendon with mild tenosynovitis and subcortical marrow edema in the posterior aspect of the medial malleolus. Flexor hallucis longus tendon intact. Flexor digitorum longus tendon intact.   Anterior: Tibialis anterior tendon intact. Extensor hallucis longus tendon intact Extensor digitorum longus tendon intact.   Achilles:  Intact.   Plantar Fascia: Intact.   LIGAMENTS   Lateral: Anterior talofibular ligament intact. Calcaneofibular ligament intact. Posterior talofibular ligament intact. Anterior and posterior tibiofibular ligaments intact.   Medial: Deltoid ligament intact. Spring ligament intact.   CARTILAGE   Ankle Joint: Small joint effusion. Normal ankle mortise. No  chondral defect.   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 No subtalar joint effusion. Normal sinus tarsi.   Bones: Bone marrow edema in the neck of the talus and the mid calcaneus likely reflects stress related changes. No fracture or dislocation.   Soft Tissue: No fluid collection or hematoma. Muscles are normal without edema or atrophy. Tarsal tunnel is normal.   IMPRESSION: 1. 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 2. Bone marrow edema in the neck of the talus and the mid calcaneus likely reflects stress related changes. 3. Mild tendinosis of the peroneus longus. 4. Mild tendinosis of the posterior tibial tendon with mild tenosynovitis and subcortical marrow edema in the posterior aspect of the medial malleolus.     Electronically Signed   By: Kathreen Devoid M.D.   On: 12/07/2020 12:01    Radiographs: Multiple views x-ray of the left foot previously taken: no fracture, dislocation, swelling or degenerative changes noted Assessment:   1. Coalition, calcaneal tarsal   2. Inability to bear weight      Plan:  Patient was evaluated and treated and all questions answered.  Reviewed the MRI findings.  I discussed with him that I think the AFO will still be beneficial so hopefully they will have this soon.  Discussed the etiology and treatment options for tarsal coalition's.  The coalition she has appears to be contained within the middle facet and so for the remainder of the subtalar and talonavicular joint appears to be in good condition.  There is stress-related changes in the calcaneus and talus.  I recommended resection of the coalition to improve hindfoot  motion, I suspect most of the associated soft tissue issue she is having is secondary to the restricted motion as well.  Discussed the risk benefits and potential complications of this including not limited to pain, swelling, infection, scar,  numbness which may be temporary or permanent, chronic pain, stiffness, nerve pain or damage, wound healing problems, bone healing problems including delayed or non-union.  Informed consent was signed and reviewed.  Surgery be scheduled mutually agreeable date.  Discussed a period of nonweightbearing following surgery and this may be difficult for her.  I recommended her physical therapist work on training her for nonweightbearing on the LLE she may need a knee scooter wheelchair or walker crutches likely will be difficult for her.  Additionally I am ordering an MRI on the contralateral side to evaluate for presence of coalition here as well considering she also has symptoms in the hindfoot here.   Surgical plan:  Procedure: -Resection tarsal coalition middle facet left subtalar joint  Location: -[facility]  Anesthesia plan: -IV sedation with regional block  Postoperative pain plan: -Liquid Tylenol, gabapentin, oxycodone.  She also takes the meloxicam which she will currently take.  I prescribed these for her as well for pain control for now we reviewed the reasoning for multimodal analgesia  DVT prophylaxis: -None required after surgery  WB Restrictions / DME needs: -NWB in CAM boot after surgery   No follow-ups on file.

## 2020-12-19 ENCOUNTER — Encounter: Payer: Self-pay | Admitting: Podiatry

## 2020-12-23 MED ORDER — OXYCODONE HCL 5 MG/5ML PO SOLN: 5.0000 mg | Freq: Every day | ORAL | 0 refills | Status: DC

## 2020-12-24 ENCOUNTER — Telehealth (INDEPENDENT_AMBULATORY_CARE_PROVIDER_SITE_OTHER): Payer: Self-pay

## 2020-12-24 NOTE — Telephone Encounter (Signed)
Fax received by covermymeds for [employer]. Initiated thru covermymeds:

## 2020-12-26 ENCOUNTER — Telehealth: Payer: Self-pay | Admitting: Urology

## 2020-12-26 NOTE — Telephone Encounter (Signed)
DOS - 01/16/21  COALITION RESECTION LEFT --- [id number]  [facility] EFFECTIVE DATE - 03/01/20  PLAN DEDUCTIBLE - Member's plan does not have an In-Network - Tier 1 individual plan deductible. OUT OF POCKET - $4,200.00 W/ $376.30 REMAINING COINSURANCE - 15% COPAY - $0.00  PER [employer] WEBSITE FOR CPT CODE 91225 Notification or Prior Authorization is not required for the requested services  Decision ID #:[id number]
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
## 2020-12-30 ENCOUNTER — Encounter (INDEPENDENT_AMBULATORY_CARE_PROVIDER_SITE_OTHER): Payer: Self-pay

## 2020-12-30 ENCOUNTER — Other Ambulatory Visit: Payer: Self-pay

## 2020-12-30 ENCOUNTER — Ambulatory Visit (INDEPENDENT_AMBULATORY_CARE_PROVIDER_SITE_OTHER): Payer: 59 | Admitting: Pharmacist

## 2020-12-30 ENCOUNTER — Telehealth (INDEPENDENT_AMBULATORY_CARE_PROVIDER_SITE_OTHER): Payer: Self-pay | Admitting: Pharmacist

## 2020-12-30 VITALS — BP 120/70 | Wt 221.8 lb

## 2020-12-30 DIAGNOSIS — E11628 Type 2 diabetes mellitus with other skin complications: Secondary | ICD-10-CM

## 2020-12-30 LAB — POCT GLUCOSE (DEVICE FOR HOME USE): POC Glucose: 173 mg/dl — AB (ref 70–99)

## 2020-12-30 MED ORDER — DEXCOM G6 SENSOR MISC
1.0000 | 11 refills | Status: DC
Start: 2020-12-30 — End: 2021-05-20

## 2020-12-30 MED ORDER — DEXCOM G6 TRANSMITTER MISC: 1.0000 | 3 refills | Status: DC

## 2020-12-30 MED ORDER — MOUNJARO 5 MG/0.5ML ~~LOC~~ SOAJ
5.0000 mg | SUBCUTANEOUS | 2 refills | Status: DC
Start: 2020-12-30 — End: 2021-03-12

## 2020-12-30 NOTE — Telephone Encounter (Signed)
Submitted Mounjaro 5 mg prior authorization on covermymeds    PA is not required as prior [facility] PA (for 2.5 mg) was approved from 2020-11-13 to 2021-05-13  Sent in prescription for [facility] 5 mg [id number] - [place], [place] - [address] [place]  [address] [place] [address]  Phone:  [phone]  Fax:  [phone]  DEA #:  [id number]  DAW Reason: --    Thank you for involving clinical pharmacist/diabetes educator to assist in providing this patient's care.   Drexel Iha, PharmD, BCACP, [place], CPP

## 2020-12-30 NOTE — Patient Instructions (Addendum)
It was a pleasure seeing you today!  Today the plan is.. Continue Mounjaro 2.5 mg subQ once weekly x 2 more weeks then increase to Mounjaro 5 mg subQ once weekly on 01/20/21 For Dexcom, please follow these steps to prevent irritation to skin and to help with adhesion to skin: Clean skin with alcohol or soap/water Spray Nasacort (generic is triamcinolone, generic is equally efficacious as brand but is more affordable) nasal spray to the skin and allow to dry. You can also try using the protective barrier wipe (I used this today as I do not have any Nasacort in clinic) Take Tegaderm patch out, fold it, and cut a triangle in the middle (this is to allow the filament of the Dexcom to be able to pierce through the skin (it is not strong enough to puncture Tegaderm) then apply to skin Apply skin tac to tegaderm. Allow it to dry. Once it feels sticky/tacky it is ready for next step. Take off Dexcom sensor sticky part and patch the hole of the Dexcom sensor to the hole of the Tegaderm. Apply Dexcom. Apply skin tac to top of adhesive of Dexcom. Allow it to dry. Once it feels sticky/tacky it is ready for next step. Apply Dexcom overpatch You can buy Nasacort over the counter from the pharmacy. You can buy Tegaderm, skin tac, protective barrier wipes from the pharmacy or on [employer]. You can order the Dexcom overpatches via the dexcom app (settings --> contact --> request sensor overpatches) You can also try putting kt tape (kinesthiology tape) on top to also help with adhesion. You can purchase this from the pharmacy.    Please contact me (Dr. Lovena Le) at [phone] or via [employer] with any questions/concerns

## 2020-12-31 ENCOUNTER — Encounter: Payer: Self-pay | Admitting: Podiatry

## 2021-01-03 ENCOUNTER — Other Ambulatory Visit: Payer: Self-pay | Admitting: Family

## 2021-01-07 ENCOUNTER — Encounter: Payer: Self-pay | Admitting: Podiatry

## 2021-01-09 ENCOUNTER — Ambulatory Visit
Admission: RE | Admit: 2021-01-09 | Discharge: 2021-01-09 | Disposition: A | Discharge status: Home / Self Care | Payer: 59 | Source: Ambulatory Visit | Attending: Podiatry | Admitting: Podiatry

## 2021-01-09 ENCOUNTER — Other Ambulatory Visit: Payer: Self-pay

## 2021-01-09 DIAGNOSIS — Q6689 Other  specified congenital deformities of feet: Secondary | ICD-10-CM

## 2021-01-15 ENCOUNTER — Other Ambulatory Visit: Payer: Self-pay | Admitting: Podiatry

## 2021-01-16 ENCOUNTER — Other Ambulatory Visit: Payer: Self-pay | Admitting: Podiatry

## 2021-01-16 ENCOUNTER — Telehealth: Payer: Self-pay | Admitting: *Deleted

## 2021-01-16 DIAGNOSIS — Q6682 Congenital vertical talus deformity, left foot: Secondary | ICD-10-CM

## 2021-01-16 HISTORY — PX: OTHER SURGICAL HISTORY: SHX169

## 2021-01-16 MED ORDER — ASPIRIN 81 MG PO CHEW: 81.0000 mg | CHEWABLE_TABLET | Freq: Two times a day (BID) | ORAL | 0 refills | Status: DC

## 2021-01-16 MED ORDER — OXYCODONE HCL 5 MG/5ML PO SOLN: 5.0000 mg | Freq: Every day | ORAL | 0 refills | Status: AC

## 2021-01-16 NOTE — Progress Notes (Signed)
11/18 middle facet coalition resection

## 2021-01-18 ENCOUNTER — Encounter: Payer: Self-pay | Admitting: Podiatry

## 2021-01-19 MED ORDER — ONDANSETRON HCL 4 MG PO TABS: 4.0000 mg | ORAL_TABLET | Freq: Three times a day (TID) | ORAL | 0 refills | Status: DC | PRN

## 2021-01-20 ENCOUNTER — Encounter: Payer: Self-pay | Admitting: Podiatry

## 2021-01-20 NOTE — Telephone Encounter (Signed)
Error message

## 2021-01-27 ENCOUNTER — Ambulatory Visit (INDEPENDENT_AMBULATORY_CARE_PROVIDER_SITE_OTHER): Payer: 59 | Admitting: Podiatry

## 2021-01-27 ENCOUNTER — Other Ambulatory Visit: Payer: Self-pay

## 2021-01-27 ENCOUNTER — Ambulatory Visit (INDEPENDENT_AMBULATORY_CARE_PROVIDER_SITE_OTHER): Payer: 59

## 2021-01-27 DIAGNOSIS — Q6689 Other  specified congenital deformities of feet: Secondary | ICD-10-CM

## 2021-01-27 DIAGNOSIS — Z9889 Other specified postprocedural states: Secondary | ICD-10-CM

## 2021-01-27 NOTE — Progress Notes (Signed)
  Subjective:  Patient ID: Deanna Levine, female    DOB: 20-Aug-2000,  MRN: [id number]  Chief Complaint  Patient presents with   Routine Post Op    DOS 01/16/21: Coalition Resection of the left foot. Patient denies nausea, vomiting, fever and chills. Patient reports pain and taking Rx q4-6hrs. Running out of pain medication.      20 y.o. female returns for post-op check.  Overall doing well pain is improving she is down to just oxycodone at night now  Review of Systems: Negative except as noted in the HPI. Denies N/V/F/Ch.   Objective:  There were no vitals filed for this visit. There is no height or weight on file to calculate BMI. Constitutional Well developed. Well nourished.  Vascular Foot warm and well perfused. Capillary refill normal to all digits.   Neurologic Normal speech. Oriented to person, place, and time. Epicritic sensation to light touch grossly present bilaterally.  Dermatologic Skin healing well without signs of infection. Skin edges well coapted without signs of infection.  Orthopedic: Tenderness to palpation noted about the surgical site.   Multiple view plain film radiographs: Interval partial resection of the coalition, difficult to visualize Assessment:   1. Coalition, calcaneal tarsal   2. Post-operative state    Plan:  Patient was evaluated and treated and all questions answered.  S/p foot surgery left -Progressing as expected post-operatively. -XR: As above, I recommend we take calcaneal axial views of the middle facet after her third postop -WB Status: Can begin WBAT in short CAM boot -Sutures: Removed next visit. -Medications: No refills required they will let me know if she needs anything else -May begin bathing the foot.  Wear an Ace wrap for compression. -I reviewed the intraoperative and radiographic findings with him again and discussed that the coalition was quite extensive and would like to see how she does long-term.  She did have some  increased motion today which is an improvement from preop.  Discussed them long-term I likely think she will end up with a subtalar fusion at some point because of arthritis  Return in about 2 weeks (around 02/10/2021) for post op (no x-rays), suture removal.

## 2021-02-06 ENCOUNTER — Encounter: Payer: Self-pay | Admitting: Internal Medicine

## 2021-02-06 ENCOUNTER — Other Ambulatory Visit: Payer: Self-pay

## 2021-02-06 ENCOUNTER — Ambulatory Visit (INDEPENDENT_AMBULATORY_CARE_PROVIDER_SITE_OTHER): Payer: 59 | Admitting: Internal Medicine

## 2021-02-06 DIAGNOSIS — R620 Delayed milestone in childhood: Secondary | ICD-10-CM

## 2021-02-06 DIAGNOSIS — F70 Mild intellectual disabilities: Secondary | ICD-10-CM | POA: Diagnosis not present

## 2021-02-06 DIAGNOSIS — L732 Hidradenitis suppurativa: Secondary | ICD-10-CM

## 2021-02-06 MED ORDER — SILVER SULFADIAZINE 1 % EX CREA: 1.0000 "application " | TOPICAL_CREAM | Freq: Every day | CUTANEOUS | 0 refills | Status: DC

## 2021-02-06 NOTE — Assessment & Plan Note (Signed)
Has had some regression with pandemic and less social opportunities.

## 2021-02-06 NOTE — Assessment & Plan Note (Signed)
Rx silvadene ointment to use topically to help decolonize.

## 2021-02-06 NOTE — Assessment & Plan Note (Signed)
They are currently trying to decide if they need to pursue guardianship. They are pursuing disability currently. She does need help with hygiene and finances and complicated decisions.

## 2021-02-06 NOTE — Patient Instructions (Addendum)
Add a vitamin to help the iron levels.   I have sent in silvadene ointment to use daily to help those areas heal faster.

## 2021-02-06 NOTE — Progress Notes (Signed)
   Subjective:   Patient ID: Deanna Levine, female    DOB: Apr 27, 2000, 20 y.o.   MRN: [id number]  HPI The patient is a 20 YO female coming in for follow up.   Review of Systems  Constitutional: Negative.   HENT: Negative.    Eyes: Negative.   Respiratory:  Negative for cough, chest tightness and shortness of breath.   Cardiovascular:  Negative for chest pain, palpitations and leg swelling.  Gastrointestinal:  Negative for abdominal distention, abdominal pain, constipation, diarrhea, nausea and vomiting.  Musculoskeletal:  Positive for arthralgias, gait problem and myalgias.  Skin: Negative.   Psychiatric/Behavioral: Negative.     Objective:  Physical Exam Constitutional:      Appearance: [name] well-developed. [name] obese.  HENT:     Head: Normocephalic and atraumatic.  Cardiovascular:     Rate and Rhythm: Normal rate and regular rhythm.  Pulmonary:     Effort: Pulmonary effort is normal. No respiratory distress.     Breath sounds: Normal breath sounds. No wheezing or rales.  Abdominal:     General: Bowel sounds are normal. There is no distension.     Palpations: Abdomen is soft.     Tenderness: There is no abdominal tenderness. There is no rebound.  Musculoskeletal:     Cervical back: Normal range of motion.  Skin:    General: Skin is warm and dry.     Comments: Left foot in boot, several abscesses open under the breasts without signs of infection active  Neurological:     Mental Status: [name] alert and oriented to person, place, and time.     Coordination: Coordination normal.    Vitals:   02/06/21 1413  Resp: 18  Weight: 214 lb (97.1 kg)  Height: 5' 4.17" (1.63 m)    This visit occurred during the SARS-CoV-2 public health emergency.  Safety protocols were in place, including screening questions prior to the visit, additional usage of staff PPE, and extensive cleaning of exam room while observing appropriate contact time as indicated for disinfecting solutions.    Assessment & Plan:

## 2021-02-09 ENCOUNTER — Other Ambulatory Visit: Payer: Self-pay | Admitting: Pulmonary Disease

## 2021-02-09 DIAGNOSIS — J301 Allergic rhinitis due to pollen: Secondary | ICD-10-CM

## 2021-02-10 ENCOUNTER — Ambulatory Visit (INDEPENDENT_AMBULATORY_CARE_PROVIDER_SITE_OTHER): Payer: 59 | Admitting: Podiatry

## 2021-02-10 ENCOUNTER — Other Ambulatory Visit: Payer: Self-pay

## 2021-02-10 DIAGNOSIS — Q6689 Other  specified congenital deformities of feet: Secondary | ICD-10-CM

## 2021-02-10 DIAGNOSIS — Z9889 Other specified postprocedural states: Secondary | ICD-10-CM

## 2021-02-10 DIAGNOSIS — M24573 Contracture, unspecified ankle: Secondary | ICD-10-CM

## 2021-02-10 NOTE — Progress Notes (Signed)
°  Subjective:  Patient ID: Deanna Levine, female    DOB: April 16, 2000,  MRN: [id number]  Chief Complaint  Patient presents with   Routine Post Op     suture removal POV #2 DOS 01/16/2021 COALITION RESECTION OF LT FOOT     20 y.o. female returns for post-op check.  Pain improved, down to mostly tylenol   Review of Systems: Negative except as noted in the HPI. Denies N/V/F/Ch.   Objective:  There were no vitals filed for this visit. There is no height or weight on file to calculate BMI. Constitutional Well developed. Well nourished.  Vascular Foot warm and well perfused. Capillary refill normal to all digits.   Neurologic Normal speech. Oriented to person, place, and time. Epicritic sensation to light touch grossly present bilaterally.  Dermatologic Skin healing well without signs of infection. Skin edges well coapted without signs of infection.  Orthopedic: Tenderness to palpation noted about the surgical site.   Multiple view plain film radiographs: Interval partial resection of the coalition, difficult to visualize Assessment:   1. Coalition, calcaneal tarsal   2. Post-operative state   3. Equinus contracture of ankle    Plan:  Patient was evaluated and treated and all questions answered.  S/p foot surgery left -Progressing as expected post-operatively. -WB Status: Can begin WBAT in regular shoe gear if tolerated -Sutures: Removed today -Resume PT for stability, strengthening, and ROM of subtalar and ankle joint  Return for post op (new x-rays (take calcaneal axial view)).

## 2021-02-12 ENCOUNTER — Encounter (INDEPENDENT_AMBULATORY_CARE_PROVIDER_SITE_OTHER): Payer: Self-pay | Admitting: Pediatric Endocrinology

## 2021-02-12 ENCOUNTER — Ambulatory Visit (INDEPENDENT_AMBULATORY_CARE_PROVIDER_SITE_OTHER): Payer: 59 | Admitting: Pediatric Endocrinology

## 2021-02-12 ENCOUNTER — Telehealth: Payer: Self-pay | Admitting: Neurology

## 2021-02-12 ENCOUNTER — Other Ambulatory Visit: Payer: Self-pay

## 2021-02-12 VITALS — BP 110/70 | HR 92 | Ht 66.14 in | Wt 222.8 lb

## 2021-02-12 DIAGNOSIS — Q8789 Other specified congenital malformation syndromes, not elsewhere classified: Secondary | ICD-10-CM | POA: Diagnosis not present

## 2021-02-12 DIAGNOSIS — T07XXXA Unspecified multiple injuries, initial encounter: Secondary | ICD-10-CM

## 2021-02-12 DIAGNOSIS — E11628 Type 2 diabetes mellitus with other skin complications: Secondary | ICD-10-CM

## 2021-02-12 LAB — POCT GLUCOSE (DEVICE FOR HOME USE): Glucose Fasting, POC: 137 mg/dL — AB (ref 70–99)

## 2021-02-12 LAB — POCT GLYCOSYLATED HEMOGLOBIN (HGB A1C): HbA1c, POC (prediabetic range): 6.4 % (ref 5.7–6.4)
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
## 2021-02-12 NOTE — Patient Instructions (Addendum)
°  Service dog application: [email or web address]  CAP/DA under medicaid for assistance services.   Referral to genetics for 6 months  Referral to adolescent medicine for LARC and Anxiety management  Please let me know if you do not hear about scheduling Dexa Scan.   Please have labs drawn   [employer]

## 2021-02-12 NOTE — Telephone Encounter (Signed)
Pt and her mother came in the office wanting to schedule another 48 hour EEG. She said the one that was done on 11/19/19 didn't work. The camera was messed up. They were supposed to redo it and she would like to schedule it.

## 2021-02-13 ENCOUNTER — Other Ambulatory Visit: Payer: Self-pay | Admitting: Allergy

## 2021-02-14 ENCOUNTER — Other Ambulatory Visit: Payer: Self-pay | Admitting: Podiatry

## 2021-02-15 NOTE — Telephone Encounter (Signed)
Please advise 

## 2021-02-17 ENCOUNTER — Ambulatory Visit (INDEPENDENT_AMBULATORY_CARE_PROVIDER_SITE_OTHER): Payer: 59 | Admitting: Podiatry

## 2021-02-17 ENCOUNTER — Other Ambulatory Visit: Payer: Self-pay

## 2021-02-17 DIAGNOSIS — M79675 Pain in left toe(s): Secondary | ICD-10-CM

## 2021-02-17 DIAGNOSIS — M2141 Flat foot [pes planus] (acquired), right foot: Secondary | ICD-10-CM

## 2021-02-17 DIAGNOSIS — B351 Tinea unguium: Secondary | ICD-10-CM | POA: Diagnosis not present

## 2021-02-17 DIAGNOSIS — M79674 Pain in right toe(s): Secondary | ICD-10-CM | POA: Diagnosis not present

## 2021-02-17 DIAGNOSIS — E119 Type 2 diabetes mellitus without complications: Secondary | ICD-10-CM

## 2021-02-17 DIAGNOSIS — M2142 Flat foot [pes planus] (acquired), left foot: Secondary | ICD-10-CM

## 2021-02-17 LAB — IRON,TIBC AND FERRITIN PANEL
%SAT: 18 % (calc) (ref 16–45)
Ferritin: 63 ng/mL (ref 16–154)
Iron: 61 ug/dL (ref 40–190)
TIBC: 348 mcg/dL (calc) (ref 250–450)

## 2021-02-17 LAB — CBC WITH DIFFERENTIAL/PLATELET
Absolute Monocytes: 310 cells/uL (ref 200–950)
Basophils Absolute: 26 cells/uL (ref 0–200)
Basophils Relative: 0.3 %
Eosinophils Absolute: 26 cells/uL (ref 15–500)
Eosinophils Relative: 0.3 %
HCT: 36.6 % (ref 35.0–45.0)
Hemoglobin: 11.2 g/dL — ABNORMAL LOW (ref 11.7–15.5)
Lymphs Abs: 2365 cells/uL (ref 850–3900)
MCH: 20.7 pg — ABNORMAL LOW (ref 27.0–33.0)
MCHC: 30.6 g/dL — ABNORMAL LOW (ref 32.0–36.0)
MCV: 67.8 fL — ABNORMAL LOW (ref 80.0–100.0)
MPV: 10.5 fL (ref 7.5–12.5)
Monocytes Relative: 3.6 %
Neutro Abs: 5874 cells/uL (ref 1500–7800)
Neutrophils Relative %: 68.3 %
Platelets: 409 10*3/uL — ABNORMAL HIGH (ref 140–400)
RBC: 5.4 10*6/uL — ABNORMAL HIGH (ref 3.80–5.10)
RDW: 17.7 % — ABNORMAL HIGH (ref 11.0–15.0)
Total Lymphocyte: 27.5 %
WBC: 8.6 10*3/uL (ref 3.8–10.8)

## 2021-02-17 LAB — VITAMIN D 1,25 DIHYDROXY
Vitamin D 1, 25 (OH)2 Total: 34 pg/mL (ref 18–72)
Vitamin D2 1, 25 (OH)2: <8 pg/mL
Vitamin D3 1, 25 (OH)2: 34 pg/mL

## 2021-02-17 LAB — COMPREHENSIVE METABOLIC PANEL
AG Ratio: 1.3 (calc) (ref 1.0–2.5)
ALT: 15 U/L (ref 6–29)
AST: 15 U/L (ref 10–30)
Albumin: 4.7 g/dL (ref 3.6–5.1)
Alkaline phosphatase (APISO): 82 U/L (ref 31–125)
BUN: 14 mg/dL (ref 7–25)
CO2: 22 mmol/L (ref 20–32)
Calcium: 10.7 mg/dL — ABNORMAL HIGH (ref 8.6–10.2)
Chloride: 102 mmol/L (ref 98–110)
Creat: 0.64 mg/dL (ref 0.50–0.96)
Globulin: 3.7 g/dL (calc) (ref 1.9–3.7)
Glucose, Bld: 111 mg/dL — ABNORMAL HIGH (ref 65–99)
Potassium: 4.1 mmol/L (ref 3.5–5.3)
Sodium: 138 mmol/L (ref 135–146)
Total Bilirubin: 0.5 mg/dL (ref 0.2–1.2)
Total Protein: 8.4 g/dL — ABNORMAL HIGH (ref 6.1–8.1)

## 2021-02-17 LAB — CBC MORPHOLOGY

## 2021-02-17 LAB — VITAMIN D 25 HYDROXY (VIT D DEFICIENCY, FRACTURES): Vit D, 25-Hydroxy: 36 ng/mL (ref 30–100)

## 2021-02-18 ENCOUNTER — Other Ambulatory Visit: Payer: Self-pay | Admitting: Family

## 2021-02-18 ENCOUNTER — Other Ambulatory Visit (INDEPENDENT_AMBULATORY_CARE_PROVIDER_SITE_OTHER): Payer: Self-pay | Admitting: Pediatric Endocrinology

## 2021-02-18 ENCOUNTER — Ambulatory Visit: Payer: 59 | Admitting: Allergy

## 2021-02-18 DIAGNOSIS — D619 Aplastic anemia, unspecified: Secondary | ICD-10-CM

## 2021-02-18 DIAGNOSIS — Q8789 Other specified congenital malformation syndromes, not elsewhere classified: Secondary | ICD-10-CM

## 2021-02-24 ENCOUNTER — Encounter: Payer: Self-pay | Admitting: Podiatry

## 2021-02-24 NOTE — Progress Notes (Signed)
ANNUAL DIABETIC FOOT EXAM  Subjective: Deanna Levine presents today for for annual diabetic foot examination.  Patient relates 3 year h/o diabetes.  Patient denies any h/o foot wounds.  Patient denies any numbness, tingling, burning, or pins/needle sensation in feet.  Patient's blood sugar was 130 mg/dl this morning.   Deanna Koch, MD is patient's PCP. Last visit was 02/06/2021.  Patient's mother is present during today's visit. They are in the process of moving back into their home as repairs are nearly completed. Deanna Levine states her left foot is doing well.  Mom would like to discuss treatment for fungal toenails.  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   Other adverse food reactions, not elsewhere classified, subsequent encounter 08/20/2020   Other allergic rhinitis 08/20/2020   Allergic conjunctivitis of both eyes 08/20/2020   Encounter for general adult medical examination with abnormal findings 07/03/2020   Leg length discrepancy 06/05/2020   Hidradenitis suppurativa 06/05/2020   Intellectual disability 06/05/2020   Achilles tendon contracture, bilateral    Elevated sedimentation rate 02/07/2020   Pain in right ankle and joints of right foot 02/07/2020   Anemia 12/29/2018   Type 2 diabetes mellitus ([place]) 08/15/2017   Elevated triglycerides with high cholesterol 12/09/2016   Hydradenitis 09/07/2016   Partial epilepsy with impairment of consciousness ([place]) 08/16/2012   Mild  intellectual disability 08/16/2012   Laxity of ligament 08/16/2012   Delayed milestones 08/16/2012   Obesity 06/07/2011   Dyspepsia 02/11/2011   Acanthosis nigricans 02/11/2011   Global developmental delay    Petit mal without grand mal seizures ([place])    Dyspraxia    Past Surgical History:  Procedure Laterality Date   ADENOIDECTOMY     GASTROCNEMIUS RECESSION Bilateral 04/25/2020   Procedure: BILATERAL GASTROCNEMIUS RECESSION;  Surgeon: Newt Minion, MD;  Location: [place];  Service: Orthopedics;  Laterality: Bilateral;   SUPPRELIN IMPLANT  05/06/2011   Procedure: SUPPRELIN IMPLANT;  Surgeon: Jerilynn Mages. Gerald Stabs, MD;  Location: [place];  Service: Pediatrics;  Laterality: Right;   SUPPRELIN IMPLANT Right 06/14/2013   Procedure: REMOVAL OF SUPPRELIN IMPLANT FROM RIGHT UPPER ARM;  Surgeon: Jerilynn Mages. Gerald Stabs, MD;  Location: [place];  Service: Pediatrics;  Laterality: Right;   TONSILLECTOMY     TONSILLECTOMY AND ADENOIDECTOMY     TYMPANOSTOMY TUBE PLACEMENT     x 2   Current Outpatient Medications on File Prior to Visit  Medication Sig Dispense Refill   acetaminophen (LIQUID ACETAMINOPHEN) 160 MG/5ML liquid Take 20 mLs (640 mg total) by mouth every 6 (six) hours as needed for pain. 473 mL 0   aspirin 81 MG chewable tablet CHEW AND SWALLOW 1 TABLET(81 MG) BY MOUTH IN THE MORNING AND AT BEDTIME 180 tablet 0   azelastine (ASTELIN) 0.1 % nasal spray Place 1 spray into both nostrils 2 (two) times daily. Use in each nostril as  directed (Patient not taking: Reported on 02/12/2021) 30 mL 12   blood glucose meter kit and supplies Dispense based on patient and insurance preference. Use up to four times daily as directed. (FOR ICD-10 E10.9, E11.9). (Patient not taking: Reported on 02/12/2021) 1 each 0   Blood Glucose Monitoring Suppl (ONETOUCH VERIO) w/Device KIT 1 kit by Does not apply route as directed. Use glucometer to check sugar up to 3x per day (E11.9)  (Patient not taking: Reported on 02/12/2021) 1 kit 3   cetirizine (ZYRTEC) 10 MG tablet Take 10 mg by mouth as needed.     chlorhexidine (HIBICLENS) 4 % external liquid Apply topically daily as needed. 120 mL 0   cholecalciferol (VITAMIN D3) 25 MCG (1000 UNIT) tablet Take 1,000 Units by mouth daily.     Continuous Blood Gluc Receiver (DEXCOM G6 RECEIVER) DEVI 1 Device by Does not apply route as directed. 1 each 2   Continuous Blood Gluc Sensor (DEXCOM G6 SENSOR) MISC Inject 1 applicator into the skin as directed. (change sensor every 10 days) 3 each 11   Continuous Blood Gluc Transmit (DEXCOM G6 TRANSMITTER) MISC Inject 1 Device into the skin as directed. (re-use up to 8x with each new sensor) 1 each 3   fluticasone (FLONASE) 50 MCG/ACT nasal spray SHAKE LIQUID AND USE 2 SPRAYS IN EACH NOSTRIL DAILY 16 g 6   Menaquinone-7 (VITAMIN K2) 100 MCG CAPS Take 100 mcg by mouth daily.     Multiple Vitamins-Minerals (MULTIVITAMIN ADULT) CHEW Chew 1 tablet by mouth daily.     olopatadine (PATANOL) 0.1 % ophthalmic solution Place 1 drop into both eyes 2 (two) times daily as needed for allergies.     ondansetron (ZOFRAN) 4 MG tablet Take 1 tablet (4 mg total) by mouth every 8 (eight) hours as needed for nausea or vomiting. 20 tablet 0   silver sulfADIAZINE (SILVADENE) 1 % cream Apply 1 application topically daily. 50 g 0   tirzepatide (MOUNJARO) 2.5 MG/0.5ML Pen Inject 2.5 mg into the skin once a week. (Patient not taking: Reported on 02/12/2021) 2 mL 5   tirzepatide (MOUNJARO) 5 MG/0.5ML Pen Inject 5 mg into the skin once a week. (Patient not taking: Reported on 02/12/2021) 2 mL 2   No current facility-administered medications on file prior to visit.    No Known Allergies Social History   Occupational History   Occupation: Clinical cytogeneticist  Tobacco Use   Smoking status: Never   Smokeless tobacco: Never  Vaping Use   Vaping Use: Never used  Substance and Sexual Activity   Alcohol use: No     Alcohol/week: 0.0 standard drinks   Drug use: No   Sexual activity: Never    Birth control/protection: None   Family History  Problem Relation Age of Onset   Diabetes Mother        steroid induced; hx. colitis   Anesthesia problems Mother        severe headache lasting 2-3 days   Rheum arthritis Mother    Ulcerative colitis Mother    Fibromyalgia Mother    Hypertension Maternal Grandmother    Hyperlipidemia Maternal Grandmother    Diabetes Maternal Grandmother    Kidney disease Maternal Grandfather    Hypertension Maternal Grandfather    Heart disease Maternal Grandfather    Hyperlipidemia Maternal Grandfather    Kidney disease Paternal Grandmother    Hypertension Paternal Grandmother    Diabetes Paternal Grandmother    Heart disease Paternal Grandmother    Hyperlipidemia Paternal  Grandmother    Hypertension Paternal Grandfather    Hyperlipidemia Paternal Grandfather    Henoch-Schonlein purpura Brother        in remission   Asthma Maternal Aunt    Seizures Maternal Uncle    Immunization History  Administered Date(s) Administered   Influenza,inj,Quad PF,6+ Mos 12/18/2018   PFIZER(Purple Top)SARS-COV-2 Vaccination 05/15/2019, 06/07/2019     Review of Systems: Negative except as noted in the HPI.   Objective: There were no vitals filed for this visit.  Deanna Levine is a pleasant 20 y.o. female in NAD. AAO X 3.  Vascular Examination: CFT immediate b/l LE. Palpable DP/PT pulses b/l LE. Digital hair present b/l. Skin temperature gradient WNL b/l. No pain with calf compression b/l. No edema noted b/l. No cyanosis or clubbing noted b/l LE.  Dermatological Examination: Pedal integument with normal turgor, texture and tone BLE. No open wounds b/l LE. Toenails bilateral great toes elongated, discolored, dystrophic, thickened, and crumbly with subungual debris and tenderness to dorsal palpation. No hyperkeratotic nor porokeratotic lesions present on today's  visit.  Musculoskeletal Examination: Muscle strength 5/5 to all lower extremity muscle groups bilaterally. Pes planus deformity noted bilateral LE.  Footwear Assessment: Does the patient wear appropriate shoes? Yes. Does the patient need inserts/orthotics? Yes.  Neurological Examination: Protective sensation intact 5/5 intact bilaterally with 10g monofilament b/l. Vibratory sensation intact b/l.  Hemoglobin A1C Latest Ref Rng & Units 02/12/2021 11/13/2020 07/24/2020 04/24/2020  HGBA1C 5.7 - 6.4 % 6.4 6.3(A) 6.3(A) 6.2  Some recent data might be hidden   Assessment: 1. Pain due to onychomycosis of toenails of both feet   2. Pes planus of both feet   3. Type 2 diabetes mellitus without complication, without long-term current use of insulin ([place])   4. Encounter for diabetic foot exam ([place])      ADA Risk Categorization: Low Risk :  Patient has all of the following: Intact protective sensation No prior foot ulcer  No severe deformity Pedal pulses present  Plan: -Diabetic foot examination performed today. -Continue foot and shoe inspections daily. Monitor blood glucose per PCP/Endocrinologist's recommendations. -Discussed treatment options for onychomycosis. Patient opted for topical topical therapy. Patient is to apply Formula 7 Emulsion to affected toenail(s) once daily. -Mycotic toenails bilateral great toes were debrided in length and girth with sterile nail nippers and dremel without iatrogenic bleeding. -Patient/POA to call should there be question/concern in the interim.  Return in about 3 months (around 05/18/2021).  Deanna Levine, DPM

## 2021-02-25 ENCOUNTER — Ambulatory Visit: Payer: 59 | Admitting: Allergy

## 2021-02-25 ENCOUNTER — Ambulatory Visit: Payer: 59 | Admitting: Internal Medicine

## 2021-02-26 ENCOUNTER — Ambulatory Visit: Payer: 59 | Admitting: Family

## 2021-02-26 ENCOUNTER — Telehealth: Payer: Self-pay | Admitting: Physician Assistant

## 2021-02-26 NOTE — Telephone Encounter (Signed)
Scheduled appt per 12/21 referral. Pt's mother is aware of appt date and time. [name] also aware to have pt here 15 mins prior to appt time.

## 2021-03-03 ENCOUNTER — Ambulatory Visit (INDEPENDENT_AMBULATORY_CARE_PROVIDER_SITE_OTHER): Payer: 59

## 2021-03-03 ENCOUNTER — Encounter: Payer: Self-pay | Admitting: Podiatry

## 2021-03-03 ENCOUNTER — Other Ambulatory Visit: Payer: Self-pay

## 2021-03-03 ENCOUNTER — Ambulatory Visit (INDEPENDENT_AMBULATORY_CARE_PROVIDER_SITE_OTHER): Payer: 59 | Admitting: Podiatry

## 2021-03-03 DIAGNOSIS — Q6689 Other  specified congenital deformities of feet: Secondary | ICD-10-CM

## 2021-03-03 NOTE — Progress Notes (Signed)
°  Subjective:  Patient ID: Deanna Levine, female    DOB: January 01, 2001,  MRN: [id number]  Chief Complaint  Patient presents with   Routine Post Op      (xray)POV #3 DOS 01/16/2021 COALITION RESECTION OF LT FOOT     21 y.o. female returns for post-op check.  Still about the same as last time as far as pain and function.  If she is on her feet for a long period of time it starts to hurt.  She has been taking meloxicam for it.  Review of Systems: Negative except as noted in the HPI. Denies N/V/F/Ch.   Objective:  There were no vitals filed for this visit. There is no height or weight on file to calculate BMI. Constitutional Well developed. Well nourished.  Vascular Foot warm and well perfused. Capillary refill normal to all digits.   Neurologic Normal speech. Oriented to person, place, and time. Epicritic sensation to light touch grossly present bilaterally.  Dermatologic Incision is well-healed minimally hypertrophic no keloid formation.  Orthopedic: Tenderness to palpation noted about the surgical site.   Multiple view plain film radiographs: No significant changes since last visit, subtalar joint still visible Assessment:   1. Coalition, calcaneal tarsal    Plan:  Patient was evaluated and treated and all questions answered.  S/p foot surgery left -Continues to progress.  I think they should continue physical therapy.  They have her old braces he had made at [facility] clinic and I may be beneficial to try wearing these again to see if this offers her any increase stability and support when she is walking.  If this is not successful within a few weeks, we could consider a another brace that may stabilize the subtalar joint better such as an [place].  They will let me know how she is doing and call to schedule an appointment with our orthotist Aaron Edelman Little for this if needed.  We again discussed that if this is not successful ultimately she may require subtalar joint  arthrodesis but this would require quite a bit of recovery compared to what she is currently experiencing  Return in about 6 weeks (around 04/14/2021) for post op (no x-rays).

## 2021-03-03 NOTE — Patient Instructions (Incomplete)
Seasonal an perennial allergic rhinitis ( June 2022 skin test positive to grass, weed pollen, ragweed, trees, cat, dog, cockroach, and dust mites) Continue environmental control measures as below. Use over the counter antihistamines such as Zyrtec (cetirizine), Claritin (loratadine), Allegra (fexofenadine), or Xyzal (levocetirizine) daily as needed. May take twice a day during allergy flares. May switch antihistamines every few months. Use Flonase (fluticasone) nasal spray 1 spray per nostril twice a day as needed for nasal congestion.  Use azelastine nasal spray 1-2 sprays per nostril twice a day as needed for runny nose/drainage. Nasal saline spray (i.e., Simply Saline) or nasal saline lavage (i.e., NeilMed) is recommended as needed and prior to medicated nasal sprays. Consider allergy injections for long term control if above medications do not help the symptoms - handout given.   Adverse food reaction ( negative skin test to milk and casein in June 2022) Lactose intolerance: May use lactose free milk or take a lactaid pill right before consuming anything with dairy. I don't think you are allergic to milk protein as you have been eating cheese with no issues.   Follow up in  months or sooner if needed.   Reducing Pollen Exposure Pollen seasons: trees (spring), grass (summer) and ragweed/weeds (fall). Keep windows closed in your home and car to lower pollen exposure.  Install air conditioning in the bedroom and throughout the house if possible.  Avoid going out in dry windy days - especially early morning. Pollen counts are highest between 5 - 10 AM and on dry, hot and windy days.  Save outside activities for late afternoon or after a heavy rain, when pollen levels are lower.  Avoid mowing of grass if you have grass pollen allergy. Be aware that pollen can also be transported indoors on people and pets.  Dry your clothes in an automatic dryer rather than hanging them outside where they  might collect pollen.  Rinse hair and eyes before bedtime.  Pet Allergen Avoidance: Contrary to popular opinion, there are no hypoallergenic breeds of dogs or cats. That is because people are not allergic to an animals hair, but to an allergen found in the animal's saliva, dander (dead skin flakes) or urine. Pet allergy symptoms typically occur within minutes. For some people, symptoms can build up and become most severe 8 to 12 hours after contact with the animal. People with severe allergies can experience reactions in public places if dander has been transported on the pet owners clothing. Keeping an animal outdoors is only a partial solution, since homes with pets in the yard still have higher concentrations of animal allergens. Before getting a pet, ask your allergist to determine if you are allergic to animals. If your pet is already considered part of your family, try to minimize contact and keep the pet out of the bedroom and other rooms where you spend a great deal of time. As with dust mites, vacuum carpets often or replace carpet with a hardwood floor, tile or linoleum. High-efficiency particulate air (HEPA) cleaners can reduce allergen levels over time. While dander and saliva are the source of cat and dog allergens, urine is the source of allergens from rabbits, hamsters, mice and [place] pigs; so ask a non-allergic family member to clean the animals cage. If you have a pet allergy, talk to your allergist about the potential for allergy immunotherapy (allergy shots). This strategy can often provide long-term relief.  Cockroach Allergen Avoidance Cockroaches are often found in the homes of densely populated urban areas, schools  or commercial buildings, but these creatures can lurk almost anywhere. This does not mean that you have a dirty house or living area. Block all areas where roaches can enter the home. This includes crevices, wall cracks and windows.  Cockroaches need water to  survive, so fix and seal all leaky faucets and pipes. Have an exterminator go through the house when your family and pets are gone to eliminate any remaining roaches. Keep food in lidded containers and put pet food dishes away after your pets are done eating. Vacuum and sweep the floor after meals, and take out garbage and recyclables. Use lidded garbage containers in the kitchen. Wash dishes immediately after use and clean under stoves, refrigerators or toasters where crumbs can accumulate. Wipe off the stove and other kitchen surfaces and cupboards regularly.  Control of House Dust Mite Allergen Dust mite allergens are a common trigger of allergy and asthma symptoms. While they can be found throughout the house, these microscopic creatures thrive in warm, humid environments such as bedding, upholstered furniture and carpeting. Because so much time is spent in the bedroom, it is essential to reduce mite levels there.  Encase pillows, mattresses, and box springs in special allergen-proof fabric covers or airtight, zippered plastic covers.  Bedding should be washed weekly in hot water (130 F) and dried in a hot dryer. Allergen-proof covers are available for comforters and pillows that cant be regularly washed.  Wash the allergy-proof covers every few months. Minimize clutter in the bedroom. Keep pets out of the bedroom.  Keep humidity less than 50% by using a dehumidifier or air conditioning. You can buy a humidity measuring device called a hygrometer to monitor this.  If possible, replace carpets with hardwood, linoleum, or washable area rugs. If that's not possible, vacuum frequently with a vacuum that has a HEPA filter. Remove all upholstered furniture and non-washable window drapes from the bedroom. Remove all non-washable stuffed toys from the bedroom.  Wash stuffed toys weekly.

## 2021-03-04 ENCOUNTER — Ambulatory Visit: Payer: 59 | Admitting: Family

## 2021-03-05 ENCOUNTER — Other Ambulatory Visit (HOSPITAL_COMMUNITY)
Admission: RE | Admit: 2021-03-05 | Discharge: 2021-03-05 | Disposition: A | Discharge status: Home / Self Care | Payer: 59 | Source: Ambulatory Visit | Attending: Pediatrics | Admitting: Pediatrics

## 2021-03-05 ENCOUNTER — Other Ambulatory Visit: Payer: Self-pay

## 2021-03-05 ENCOUNTER — Ambulatory Visit (INDEPENDENT_AMBULATORY_CARE_PROVIDER_SITE_OTHER): Payer: 59 | Admitting: Pediatrics

## 2021-03-05 VITALS — BP 118/74 | HR 103 | Ht 66.34 in | Wt 221.8 lb

## 2021-03-05 DIAGNOSIS — Z113 Encounter for screening for infections with a predominantly sexual mode of transmission: Secondary | ICD-10-CM | POA: Diagnosis present

## 2021-03-05 DIAGNOSIS — Z30017 Encounter for initial prescription of implantable subdermal contraceptive: Secondary | ICD-10-CM | POA: Diagnosis not present

## 2021-03-05 DIAGNOSIS — Z3202 Encounter for pregnancy test, result negative: Secondary | ICD-10-CM | POA: Diagnosis not present

## 2021-03-05 DIAGNOSIS — N920 Excessive and frequent menstruation with regular cycle: Secondary | ICD-10-CM | POA: Diagnosis not present

## 2021-03-05 LAB — POCT URINE PREGNANCY: Preg Test, Ur: NEGATIVE

## 2021-03-05 MED ORDER — ETONOGESTREL 68 MG ~~LOC~~ IMPL
68.0000 mg | DRUG_IMPLANT | Freq: Once | SUBCUTANEOUS | Status: AC
  Administered 2021-03-05: 68 mg via SUBCUTANEOUS
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
## 2021-03-05 NOTE — Progress Notes (Signed)
Nexplanon Insertion  No contraindications for placement.  No liver disease, no unexplained vaginal bleeding, no h/o breast cancer, no h/o blood clots.  Patient's last menstrual period was 02/10/2021.  UHCG: NEG  Last Unprotected sex:  Never  Risks & benefits of Nexplanon discussed The nexplanon device was purchased and supplied by [facility]. Packaging instructions supplied to patient Consent form signed  The patient denies any allergies to anesthetics or antiseptics.  Procedure: Pt was placed in supine position. The left arm was flexed at the elbow and externally rotated so that left wrist was parallel to left ear The medial epicondyle of the left arm was identified The insertions site was marked 8 cm proximal to the medial epicondyle The insertion site was cleaned with Betadine The area surrounding the insertion site was covered with a sterile drape 1% lidocaine was injected just under the skin at the insertion site extending 4 cm proximally. The sterile preloaded disposable Nexaplanon applicator was removed from the sterile packaging The applicator needle was inserted at a 30 degree angle at 8 cm proximal to the medial epicondyle as marked The applicator was lowered to a horizontal position and advanced just under the skin for the full length of the needle The slider on the applicator was retracted fully while the applicator remained in the same position, then the applicator was removed. The implant was confirmed via palpation as being in position The implant position was demonstrated to the patient Pressure dressing was applied to the patient.  The patient was instructed to removed the pressure dressing in 24 hrs.  The patient was advised to move slowly from a supine to an upright position  The patient denied any concerns or complaints  The patient was instructed to schedule a follow-up appt in 1 month and to call sooner if any concerns.  The patient acknowledged agreement and  understanding of the plan.
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
## 2021-03-05 NOTE — Patient Instructions (Addendum)
° °  Congratulations on getting your Nexplanon placement!  Below is some important information about Nexplanon.  First remember that Nexplanon does not prevent sexually transmitted infections.  Condoms will help prevent sexually transmitted infections. The Nexplanon starts working 7 days after it was inserted.  There is a risk of getting pregnant if you have unprotected sex in those first 7 days after placement of the Nexplanon.  The Nexplanon lasts for 3 years but can be removed at any time.  You can become pregnant as early as 1 week after removal.  You can have a new Nexplanon put in after the old one is removed if you like.  It is not known whether Nexplanon is as effective in women who are very overweight because the studies did not include many overweight women.  Nexplanon interacts with some medications, including barbiturates, bosentan, carbamazepine, felbamate, griseofulvin, oxcarbazepine, phenytoin, rifampin, St. John's wort, topiramate, HIV medicines.  Please alert your doctor if you are on any of these medicines.  Always tell other healthcare providers that you have a Nexplanon in your arm.  The Nexplanon was placed just under the skin.  Leave the outside bandage on for 24 hours.  Leave the smaller bandage on for 3-5 days or until it falls off on its own.  Keep the area clean and dry for 3-5 days. There is usually bruising or swelling at the insertion site for a few days to a week after placement.  If you see redness or pus draining from the insertion site, call us immediately.  Keep your user card with the date the implant was placed and the date the implant is to be removed.  The most common side effect is a change in your menstrual bleeding pattern.   This bleeding is generally not harmful to you but can be annoying.  Call or come in to see [name] if you have any concerns about the bleeding or if you have any side effects or questions.    We will call you in 1 week to check in and we  would like you to return to the clinic for a follow-up visit in 1 month.  You can call [facility] 24 hours a day with any questions or concerns.  There is always a nurse or doctor available to take your call.  Call 9-1-1 if you have a life-threatening emergency.  For anything else, please call us at [phone] before heading to the ER.

## 2021-03-06 ENCOUNTER — Telehealth: Payer: Self-pay | Admitting: Physician Assistant

## 2021-03-06 LAB — URINE CYTOLOGY ANCILLARY ONLY
Chlamydia: NEGATIVE
Comment: NEGATIVE
Comment: NORMAL
Neisseria Gonorrhea: NEGATIVE

## 2021-03-06 NOTE — Telephone Encounter (Signed)
Pt called in to can new hem appt on 1/13. Spoke to pt who said she did not want to r/s at this time. Appt cancelled and referral closed.

## 2021-03-09 ENCOUNTER — Encounter (HOSPITAL_BASED_OUTPATIENT_CLINIC_OR_DEPARTMENT_OTHER): Payer: 59 | Attending: Internal Medicine | Admitting: Internal Medicine

## 2021-03-09 ENCOUNTER — Other Ambulatory Visit: Payer: Self-pay

## 2021-03-09 DIAGNOSIS — L732 Hidradenitis suppurativa: Secondary | ICD-10-CM | POA: Diagnosis present

## 2021-03-09 DIAGNOSIS — L98498 Non-pressure chronic ulcer of skin of other sites with other specified severity: Secondary | ICD-10-CM | POA: Insufficient documentation

## 2021-03-09 DIAGNOSIS — E11628 Type 2 diabetes mellitus with other skin complications: Secondary | ICD-10-CM | POA: Diagnosis not present

## 2021-03-09 DIAGNOSIS — N6310 Unspecified lump in the right breast, unspecified quadrant: Secondary | ICD-10-CM | POA: Insufficient documentation

## 2021-03-09 LAB — PROLACTIN: Prolactin: 7.8 ng/mL

## 2021-03-09 LAB — EXTRA LAV TOP TUBE

## 2021-03-09 LAB — FSH/LH
FSH: 2.2 m[IU]/mL
LH: 2 m[IU]/mL

## 2021-03-09 LAB — TESTOS,TOTAL,FREE AND SHBG (FEMALE)
Free Testosterone: 2.2 pg/mL (ref 0.1–6.4)
Sex Hormone Binding: 21 nmol/L (ref 17–124)
Testosterone, Total, LC-MS-MS: 12 ng/dL (ref 2–45)

## 2021-03-09 LAB — DHEA-SULFATE: DHEA-SO4: 112 ug/dL (ref 44–286)

## 2021-03-09 LAB — TSH+FREE T4: TSH W/REFLEX TO FT4: 1.79 mIU/L

## 2021-03-09 NOTE — Progress Notes (Signed)
Deanna Levine, Deanna Levine ([id number]) Visit Report for 03/09/2021 Chief Complaint Document Details Patient Name: Date of Service: Deanna Levine, Deanna Levine 03/09/2021 2:45 PM Medical Record Number: [id number] Patient Account Number: [id number] Date of Birth/Sex: Treating RN: [birth year] (21 y.o. Sue Lush Primary Care Provider: Pricilla Holm Other Clinician: Referring Provider: Treating Provider/Extender: Verlene Mayer in Treatment: 0 Information Obtained from: Patient Chief Complaint Hx of hiadrenitis Electronic Signature(s) Signed: 03/09/2021 3:53:07 PM By: Kalman Shan DO Entered By: Kalman Shan on 03/09/2021 15:45:52 -------------------------------------------------------------------------------- HPI Details Patient Name: Date of Service: Deanna Levine, Deanna Levine 03/09/2021 2:45 PM Medical Record Number: [id number] Patient Account Number: [id number] Date of Birth/Sex: Treating RN: 2000-04-01 (21 y.o. Sue Lush Primary Care Provider: Pricilla Holm Other Clinician: Referring Provider: Treating Provider/Extender: Verlene Mayer in Treatment: 0 History of Present Illness HPI Description: 06/04/2020 patient presents today for initial evaluation here in clinic concerning issues she has been having with her under her arms, under the breast, in the suprapubic region, and on her upper back as well with issues which are consistent with hidradenitis though I think it is more on the milder side. Her and her mother have been on top of this trying to keep things under control. Her mother also has issues with hidradenitis though not severely either. This does seem to potentially be something that runs in the family. Nonetheless fortunately there does not appear to be any evidence of significant infection at this point. The wound which is actually under the right breast location actually does not appear to be infected right now which is  great news. With that being said I do see evidence currently of scarring in multiple locations which show that the patient has obviously had issues in the past that several other locations as well. All in the same regions. Nonetheless she does not have a tremendous amount of pain noted at this point which is good news. She does have some mild mental disability according to her mother she does have mild dysphagia as well which requires that she has to either take liquid or crushed antibiotics when she does get those. Her most recent hemoglobin A1c was 6.6. Currently this episode has been going on for about 6 months ago again 3 years as the total length of time she has been dealing with this. She has never seen a hidradenitis clinic. 06/18/20 upon evaluation today patient appears to be doing in my opinion a little better compared to her previous evaluation. There does not appear to be any signs of infection which is great news and overall very pleased with where things stand today. She has not heard from the hidradenitis clinic at [facility] yet although we made that referral last time she was here 2 weeks ago. 5//22 upon evaluation today patient appears to be doing well with regard to her wounds. I do think the Hydrofera Blue has been doing well for her. She has a new area open up on the left/medial chest just inside of her breast. With that being said this does not appear to be too significant at this point which is great news. With that being said she unfortunately is continuing to have new areas show up but again these are minimal and seem to be under fairly good control which is great as well. I do not see any signs of active infection at this point which is great news. She did get a call from [facility] from the hidradenitis clinic. With that being said she is  on a wait list it sounds like at this point. 07/30/2020 upon evaluation today patient appears to be doing well with regard to her wounds for the most part.  There are some areas that have actually healed quite nicely. With that being said she has some regions that do not seem to be healing nearly as effectively at this point. Overall I am pleased with what we are seeing for the most part but at the same time I do feel like that the new areas that pop up or not good she has an area underneath her underarm on the left as well. She has been on doxycycline. She still on the wait list at [facility] she has heard nothing from that according to her mother. In regard to the dermatologist that they have through [facility] that she will be seeing in November they are on a wait list there in case anybody cancels but have not heard anything from that. 09/03/2020 upon evaluation today patient appears to be doing well with regard to her the hidradenitis areas underneath the breast location. She has been tolerating the dressing changes without complication with been using Hydrofera Blue which is doing a great job. She does not have any open areas under the underarms either at this point. Everything seems to be doing quite well which is great. 10/01/2020 upon evaluation today patient appears to be doing well with regard to her wounds. Again she has mainly been seen for issues with hidradenitis and to be honest everything appears to be pretty much filled out at this point. There does not appear to be any evidence of active infection which is great news and overall I am extremely pleased with where things stand today. No fevers, chills, nausea, vomiting, or diarrhea. Readmission 03/09/2021; patient presents because she states that a few weeks ago she had reopening of areas underneath her breast and right axilla related to her hidradenitis. She follows with dermatology for this issue. She states these areas have healed and she has no open wounds today. She states she uses Hibiclens and clindamycin solution to help maintain the area clean. She was supposed to be on Keflex suspension however  because she had surgery this was held and when she went back to retake it it had expired. She has not followed up with her dermatologist for this. She has not heard back from the hidradenitis clinic at [facility] that we referred her to. Electronic Signature(s) Signed: 03/09/2021 3:53:07 PM By: Kalman Shan DO Entered By: Kalman Shan on 03/09/2021 15:49:28 -------------------------------------------------------------------------------- Physical Exam Details Patient Name: Date of Service: Deanna Levine, Deanna Levine 03/09/2021 2:45 PM Medical Record Number: [id number] Patient Account Number: [id number] Date of Birth/Sex: Treating RN: 2000-04-30 (21 y.o. Sue Lush Primary Care Provider: Pricilla Holm Other Clinician: Referring Provider: Treating Provider/Extender: Verlene Mayer in Treatment: 0 Constitutional respirations regular, non-labored and within target range for patient.Marland Kitchen Psychiatric pleasant and cooperative. Notes No open wounds to her axilla, under her breasts or her back Electronic Signature(s) Signed: 03/09/2021 3:53:07 PM By: Kalman Shan DO Entered By: Kalman Shan on 03/09/2021 15:49:52 -------------------------------------------------------------------------------- Physician Orders Details Patient Name: Date of Service: Deanna Levine, Deanna Levine 03/09/2021 2:45 PM Medical Record Number: [id number] Patient Account Number: [id number] Date of Birth/Sex: Treating RN: Oct 06, 2000 (21 y.o. Sue Lush Primary Care Provider: Pricilla Holm Other Clinician: Referring Provider: Treating Provider/Extender: Verlene Mayer in Treatment: 0 Verbal / Phone Orders: No Diagnosis Coding Follow-up Appointments Other: - No follow up needed but call  if areas open back up. Additional Orders / Instructions Other: - Our clinic will reach out to [facility] to inquire about referral status Non Wound  Condition Other Non Wound Condition Orders/Instructions: - Continue treatment prescribed by dermatology, Hibiclens and Cleocin-T Electronic Signature(s) Signed: 03/09/2021 3:53:07 PM By: Kalman Shan DO Entered By: Kalman Shan on 03/09/2021 15:50:09 -------------------------------------------------------------------------------- Problem List Details Patient Name: Date of Service: Deanna Levine, Deanna Levine 03/09/2021 2:45 PM Medical Record Number: [id number] Patient Account Number: [id number] Date of Birth/Sex: Treating RN: 2000/03/02 (21 y.o. Sue Lush Primary Care Provider: Pricilla Holm Other Clinician: Referring Provider: Treating Provider/Extender: Verlene Mayer in Treatment: 0 Active Problems ICD-10 Encounter Code Description Active Date MDM Diagnosis L73.2 Hidradenitis suppurativa 03/09/2021 No Yes E11.628 Type 2 diabetes mellitus with other skin complications 4/0/9811 No Yes Inactive Problems Resolved Problems Electronic Signature(s) Signed: 03/09/2021 3:53:07 PM By: Kalman Shan DO Entered By: Kalman Shan on 03/09/2021 15:43:43 -------------------------------------------------------------------------------- Progress Note Details Patient Name: Date of Service: Deanna Levine, Deanna Levine 03/09/2021 2:45 PM Medical Record Number: [id number] Patient Account Number: [id number] Date of Birth/Sex: Treating RN: 2001/02/17 (21 y.o. Sue Lush Primary Care Provider: Pricilla Holm Other Clinician: Referring Provider: Treating Provider/Extender: Verlene Mayer in Treatment: 0 Subjective Chief Complaint Information obtained from Patient Hx of hiadrenitis History of Present Illness (HPI) 06/04/2020 patient presents today for initial evaluation here in clinic concerning issues she has been having with her under her arms, under the breast, in the suprapubic region, and on her upper back as well with  issues which are consistent with hidradenitis though I think it is more on the milder side. Her and her mother have been on top of this trying to keep things under control. Her mother also has issues with hidradenitis though not severely either. This does seem to potentially be something that runs in the family. Nonetheless fortunately there does not appear to be any evidence of significant infection at this point. The wound which is actually under the right breast location actually does not appear to be infected right now which is great news. With that being said I do see evidence currently of scarring in multiple locations which show that the patient has obviously had issues in the past that several other locations as well. All in the same regions. Nonetheless she does not have a tremendous amount of pain noted at this point which is good news. She does have some mild mental disability according to her mother she does have mild dysphagia as well which requires that she has to either take liquid or crushed antibiotics when she does get those. Her most recent hemoglobin A1c was 6.6. Currently this episode has been going on for about 6 months ago again 3 years as the total length of time she has been dealing with this. She has never seen a hidradenitis clinic. 06/18/20 upon evaluation today patient appears to be doing in my opinion a little better compared to her previous evaluation. There does not appear to be any signs of infection which is great news and overall very pleased with where things stand today. She has not heard from the hidradenitis clinic at [facility] yet although we made that referral last time she was here 2 weeks ago. 5//22 upon evaluation today patient appears to be doing well with regard to her wounds. I do think the Hydrofera Blue has been doing well for her. She has a new area open up on the left/medial chest just inside of  her breast. With that being said this does not appear to be too  significant at this point which is great news. With that being said she unfortunately is continuing to have new areas show up but again these are minimal and seem to be under fairly good control which is great as well. I do not see any signs of active infection at this point which is great news. She did get a call from [facility] from the hidradenitis clinic. With that being said she is on a wait list it sounds like at this point. 07/30/2020 upon evaluation today patient appears to be doing well with regard to her wounds for the most part. There are some areas that have actually healed quite nicely. With that being said she has some regions that do not seem to be healing nearly as effectively at this point. Overall I am pleased with what we are seeing for the most part but at the same time I do feel like that the new areas that pop up or not good she has an area underneath her underarm on the left as well. She has been on doxycycline. She still on the wait list at [facility] she has heard nothing from that according to her mother. In regard to the dermatologist that they have through [facility] that she will be seeing in November they are on a wait list there in case anybody cancels but have not heard anything from that. 09/03/2020 upon evaluation today patient appears to be doing well with regard to her the hidradenitis areas underneath the breast location. She has been tolerating the dressing changes without complication with been using Hydrofera Blue which is doing a great job. She does not have any open areas under the underarms either at this point. Everything seems to be doing quite well which is great. 10/01/2020 upon evaluation today patient appears to be doing well with regard to her wounds. Again she has mainly been seen for issues with hidradenitis and to be honest everything appears to be pretty much filled out at this point. There does not appear to be any evidence of active infection which is great news  and overall I am extremely pleased with where things stand today. No fevers, chills, nausea, vomiting, or diarrhea. Readmission 03/09/2021; patient presents because she states that a few weeks ago she had reopening of areas underneath her breast and right axilla related to her hidradenitis. She follows with dermatology for this issue. She states these areas have healed and she has no open wounds today. She states she uses Hibiclens and clindamycin solution to help maintain the area clean. She was supposed to be on Keflex suspension however because she had surgery this was held and when she went back to retake it it had expired. She has not followed up with her dermatologist for this. She has not heard back from the hidradenitis clinic at [facility] that we referred her to. Patient History Information obtained from Patient. Allergies No Known Drug Allergies Family History Diabetes - Maternal Grandparents,Paternal Grandparents,Mother,Father, Heart Disease - Mother, Hypertension - Paternal Aeronautical engineer, Stroke - Paternal Grandparents, No family history of Cancer, Hereditary Spherocytosis, Kidney Disease, Lung Disease, Seizures, Thyroid Problems, Tuberculosis. Social History Never smoker, Marital Status - Single, Alcohol Use - Never, Drug Use - No History, Caffeine Use - Never. Medical History Eyes Denies history of Cataracts, Glaucoma, Optic Neuritis Ear/Nose/Mouth/Throat Patient has history of Chronic sinus problems/congestion Denies history of Middle ear problems Hematologic/Lymphatic Denies history of Anemia, Hemophilia, Human  Immunodeficiency Virus, Lymphedema, Sickle Cell Disease Respiratory Denies history of Aspiration, Asthma, Chronic Obstructive Pulmonary Disease (COPD), Pneumothorax, Sleep Apnea, Tuberculosis Cardiovascular Denies history of Angina, Arrhythmia, Congestive Heart Failure, Coronary Artery Disease, Deep Vein Thrombosis, Hypertension, Hypotension,  Myocardial Infarction, Peripheral Arterial Disease, Peripheral Venous Disease, Phlebitis, Vasculitis Gastrointestinal Denies history of Cirrhosis , Colitis, Crohnoos, Hepatitis A, Hepatitis B, Hepatitis C Endocrine Patient has history of Type II Diabetes Denies history of Type I Diabetes Genitourinary Denies history of End Stage Renal Disease Immunological Denies history of Lupus Erythematosus, Raynaudoos, Scleroderma Integumentary (Skin) Denies history of History of Burn Musculoskeletal Patient has history of Osteoarthritis - right foot Denies history of Gout, Rheumatoid Arthritis, Osteomyelitis Neurologic Patient has history of Seizure Disorder Denies history of Dementia, Neuropathy, Quadriplegia, Paraplegia Oncologic Denies history of Received Chemotherapy, Received Radiation Psychiatric Denies history of Anorexia/bulimia, Confinement Anxiety Hospitalization/Surgery History - 04/25/2020 bilateral achills tendon release. - 2005 tonsil and adenoids removed. 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 Vitals Time Taken: 2:48 PM, Temperature: 97.3 F, Pulse: 101 bpm, Respiratory Rate: 16 breaths/min, Blood Pressure: 111/74 mmHg. Psychiatric pleasant and cooperative. General Notes: No open wounds to her axilla, under her breasts or her back Assessment Active Problems ICD-10 Hidradenitis suppurativa Type 2 diabetes mellitus with other skin complications Patient has a history of hidradenitis. She states that she had open sores recently however these have healed. There are no open wounds on exam. I recommended using Hibiclens and reaching out to her dermatologist to follow-up on her chronic regimen. We will call the hidradenitis  clinic at [facility] to follow-up patient's referral. For now patient can follow-up as needed. Plan Follow-up Appointments: Other: - No follow up needed but call if areas open back up. Additional Orders / Instructions: Other: - Our clinic will reach out to [facility] to inquire about referral status Non Wound Condition: Other Non Wound Condition Orders/Instructions: - Continue treatment prescribed by dermatology, Hibiclens and Cleocin-T 1. Follow-up as needed Electronic Signature(s) Signed: 03/09/2021 3:53:07 PM By: Kalman Shan DO Entered By: Kalman Shan on 03/09/2021 15:51:27 -------------------------------------------------------------------------------- HxROS Details Patient Name: Date of Service: Deanna Levine, Deanna Levine 03/09/2021 2:45 PM Medical Record Number: [id number] Patient Account Number: [id number] Date of Birth/Sex: Treating RN: 03-22-2000 (21 y.o. Sue Lush Primary Care Provider: Pricilla Holm Other Clinician: Referring Provider: Treating Provider/Extender: Verlene Mayer in Treatment: 0 Information Obtained From Patient Constitutional Symptoms (General Health) Medical History: Past Medical History Notes: dyspraxia Dysphagia with pills achilles tendon contracture bilateral-surgery 04/25/2020 Eyes Medical History: Negative for: Cataracts; Glaucoma; Optic Neuritis Ear/Nose/Mouth/Throat Medical History: Positive for: Chronic sinus problems/congestion Negative for: Middle ear problems Hematologic/Lymphatic Medical History: Negative for: Anemia; Hemophilia; Human Immunodeficiency Virus; Lymphedema; Sickle Cell Disease Respiratory Medical History: Negative for: Aspiration; Asthma; Chronic Obstructive Pulmonary Disease (COPD); Pneumothorax; Sleep Apnea; Tuberculosis Cardiovascular Medical History: Negative for: Angina; Arrhythmia; Congestive Heart Failure; Coronary Artery Disease; Deep Vein Thrombosis; Hypertension;  Hypotension; Myocardial Infarction; Peripheral Arterial Disease; Peripheral Venous Disease; Phlebitis; Vasculitis Gastrointestinal Medical History: Negative for: Cirrhosis ; Colitis; Crohns; Hepatitis A; Hepatitis B; Hepatitis C Endocrine Medical History: Positive for: Type II Diabetes Negative for: Type I Diabetes Time with diabetes: 3 years ago Treated with: Insulin Blood sugar tested every day: Yes Tested : continuous Genitourinary Medical History: Negative for: End Stage Renal Disease Immunological Medical History: Negative for: Lupus Erythematosus; Raynauds; Scleroderma Integumentary (  Skin) Medical History: Negative for: History of Burn Musculoskeletal Medical History: Positive for: Osteoarthritis - right foot Negative for: Gout; Rheumatoid Arthritis; Osteomyelitis Neurologic Medical History: Positive for: Seizure Disorder Negative for: Dementia; Neuropathy; Quadriplegia; Paraplegia Past Medical History Notes: White-Sutton Syndrome Oncologic Medical History: Negative for: Received Chemotherapy; Received Radiation Psychiatric Medical History: Negative for: Anorexia/bulimia; Confinement Anxiety HBO Extended History Items Ear/Nose/Mouth/Throat: Chronic sinus problems/congestion Immunizations Pneumococcal Vaccine: Received Pneumococcal Vaccination: Yes Received Pneumococcal Vaccination On or After 60th Birthday: No Implantable Devices No devices added Hospitalization / Surgery History Type of Hospitalization/Surgery 04/25/2020 bilateral achills tendon release 2005 tonsil and adenoids removed Family and Social History Cancer: No; Diabetes: Yes - Maternal Grandparents,Paternal Grandparents,Mother,Father; Heart Disease: Yes - Mother; Hereditary Spherocytosis: No; Hypertension: Yes - Paternal Grandparents,Maternal Grandparents,Father; Kidney Disease: No; Lung Disease: No; Seizures: No; Stroke: Yes - Paternal Grandparents; Thyroid Problems: No; Tuberculosis: No; Never  smoker; Marital Status - Single; Alcohol Use: Never; Drug Use: No History; Caffeine Use: Never; Financial Concerns: No; Food, Clothing or Shelter Needs: No; Support System Lacking: No; Transportation Concerns: No Electronic Signature(s) Signed: 03/09/2021 3:53:07 PM By: Kalman Shan DO Signed: 03/09/2021 4:35:23 PM By: Lorrin Jackson Entered By: Lorrin Jackson on 03/09/2021 14:53:29 -------------------------------------------------------------------------------- SuperBill Details Patient Name: Date of Service: Deanna Levine, Deanna Levine 03/09/2021 Medical Record Number: [id number] Patient Account Number: [id number] Date of Birth/Sex: Treating RN: 05-20-2000 (21 y.o. Sue Lush Primary Care Provider: Pricilla Holm Other Clinician: Referring Provider: Treating Provider/Extender: Verlene Mayer in Treatment: 0 Diagnosis Coding ICD-10 Codes Code Description L73.2 Hidradenitis suppurativa E11.628 Type 2 diabetes mellitus with other skin complications Facility Procedures CPT4 Code: [id number] Description: 44975 - WOUND CARE VISIT-LEV 3 EST PT Modifier: 25 Quantity: 1 Physician Procedures : CPT4 Code Description Modifier [id number] - WC PHYS LEVEL 3 - EST PT ICD-10 Diagnosis Description L73.2 Hidradenitis suppurativa E11.628 Type 2 diabetes mellitus with other skin complications Quantity: 1 Electronic Signature(s) Signed: 03/09/2021 3:53:07 PM By: Kalman Shan DO Entered By: Kalman Shan on 03/09/2021 15:52:40

## 2021-03-09 NOTE — Progress Notes (Signed)
Deanna Levine, Deanna Levine ([id number]) Visit Report for 03/09/2021 Allergy List Details Patient Name: Date of Service: NORMAGENE, Levine 03/09/2021 2:45 PM Medical Record Number: [id number] Patient Account Number: [id number] Date of Birth/Sex: Treating RN: 12-Jul-2000 (21 y.o. Sue Lush Primary Care Jaquasia Doscher: Pricilla Holm Other Clinician: Referring Nyra Anspaugh: Treating Janey Petron/Extender: Verlene Mayer in Treatment: 0 Allergies Active Allergies No Known Drug Allergies Allergy Notes Electronic Signature(s) Signed: 03/09/2021 4:35:23 PM By: Lorrin Jackson Entered By: Lorrin Jackson on 03/09/2021 14:44:47 -------------------------------------------------------------------------------- Arrival Information Details Patient Name: Date of Service: Deanna Levine 03/09/2021 2:45 PM Medical Record Number: [id number] Patient Account Number: [id number] Date of Birth/Sex: Treating RN: 13-Apr-2000 (21 y.o. Sue Lush Primary Care Bethanne Mule: Pricilla Holm Other Clinician: Referring Arlie Posch: Treating Clever Geraldo/Extender: Verlene Mayer in Treatment: 0 Visit Information Patient Arrived: Ambulatory Arrival Time: 14:47 Accompanied By: mom Transfer Assistance: None Patient Identification Verified: Yes Secondary Verification Process Completed: Yes Patient Requires Transmission-Based Precautions: No Patient Has Alerts: No History Since Last Visit Added or deleted any medications: No Any new allergies or adverse reactions: No Had a fall or experienced change in activities of daily living that may affect risk of falls: No Signs or symptoms of abuse/neglect since last visito No Hospitalized since last visit: No Implantable device outside of the clinic excluding cellular tissue based products placed in the center since last visit: No Has Dressing in Place as Prescribed: Yes Pain Present Now: Yes Electronic Signature(s) Signed:  03/09/2021 4:35:23 PM By: Lorrin Jackson Entered By: Lorrin Jackson on 03/09/2021 14:47:34 -------------------------------------------------------------------------------- Clinic Level of Care Assessment Details Patient Name: Date of Service: Deanna Levine 03/09/2021 2:45 PM Medical Record Number: [id number] Patient Account Number: [id number] Date of Birth/Sex: Treating RN: August 03, 2000 (21 y.o. Sue Lush Primary Care Laszlo Ellerby: Pricilla Holm Other Clinician: Referring Beacher Every: Treating Mayline Dragon/Extender: Verlene Mayer in Treatment: 0 Clinic Level of Care Assessment Items TOOL 2 Quantity Score X- 1 0 Use when only an EandM is performed on the INITIAL visit ASSESSMENTS - Nursing Assessment / Reassessment X- 1 20 General Physical Exam (combine w/ comprehensive assessment (listed just below) when performed on new pt. evals) X- 1 25 Comprehensive Assessment (HX, ROS, Risk Assessments, Wounds Hx, etc.) ASSESSMENTS - Wound and Skin A ssessment / Reassessment X - Simple Wound Assessment / Reassessment - one wound 1 5 []  - 0 Complex Wound Assessment / Reassessment - multiple wounds []  - 0 Dermatologic / Skin Assessment (not related to wound area) ASSESSMENTS - Ostomy and/or Continence Assessment and Care []  - 0 Incontinence Assessment and Management []  - 0 Ostomy Care Assessment and Management (repouching, etc.) PROCESS - Coordination of Care []  - 0 Simple Patient / Family Education for ongoing care X- 1 20 Complex (extensive) Patient / Family Education for ongoing care X- 1 10 Staff obtains Programmer, systems, Records, T Results / Process Orders est X- 1 10 Staff telephones HHA, Nursing Homes / Clarify orders / etc []  - 0 Routine Transfer to another Facility (non-emergent condition) []  - 0 Routine Hospital Admission (non-emergent condition) []  - 0 New Admissions / Biomedical engineer / Ordering NPWT Apligraf, etc. , []  - 0 Emergency  Hospital Admission (emergent condition) []  - 0 Simple Discharge Coordination []  - 0 Complex (extensive) Discharge Coordination PROCESS - Special Needs []  - 0 Pediatric / Minor Patient Management []  - 0 Isolation Patient Management []  - 0 Hearing / Language / Visual special needs []  - 0 Assessment of Community assistance (transportation, D/C planning, etc.) []  -  0 Additional assistance / Altered mentation []  - 0 Support Surface(s) Assessment (bed, cushion, seat, etc.) INTERVENTIONS - Wound Cleansing / Measurement []  - 0 Wound Imaging (photographs - any number of wounds) []  - 0 Wound Tracing (instead of photographs) []  - 0 Simple Wound Measurement - one wound []  - 0 Complex Wound Measurement - multiple wounds []  - 0 Simple Wound Cleansing - one wound []  - 0 Complex Wound Cleansing - multiple wounds INTERVENTIONS - Wound Dressings []  - 0 Small Wound Dressing one or multiple wounds []  - 0 Medium Wound Dressing one or multiple wounds []  - 0 Large Wound Dressing one or multiple wounds []  - 0 Application of Medications - injection INTERVENTIONS - Miscellaneous []  - 0 External ear exam []  - 0 Specimen Collection (cultures, biopsies, blood, body fluids, etc.) []  - 0 Specimen(s) / Culture(s) sent or taken to Lab for analysis []  - 0 Patient Transfer (multiple staff / Civil Service fast streamer / Similar devices) []  - 0 Simple Staple / Suture removal (25 or less) []  - 0 Complex Staple / Suture removal (26 or more) []  - 0 Hypo / Hyperglycemic Management (close monitor of Blood Glucose) []  - 0 Ankle / Brachial Index (ABI) - do not check if billed separately Has the patient been seen at the hospital within the last three years: Yes Total Score: 90 Level Of Care: New/Established - Level 3 Electronic Signature(s) Signed: 03/09/2021 4:35:23 PM By: Lorrin Jackson Entered By: Lorrin Jackson on 03/09/2021  15:35:28 -------------------------------------------------------------------------------- Encounter Discharge Information Details Patient Name: Date of Service: Deanna Levine 03/09/2021 2:45 PM Medical Record Number: [id number] Patient Account Number: [id number] Date of Birth/Sex: Treating RN: 2000-03-05 (21 y.o. Sue Lush Primary Care Senai Ramnath: Pricilla Holm Other Clinician: Referring Jassmin Kemmerer: Treating India Jolin/Extender: Verlene Mayer in Treatment: 0 Encounter Discharge Information Items Discharge Condition: Stable Ambulatory Status: Ambulatory Discharge Destination: Home Transportation: Private Auto Accompanied By: mother Schedule Follow-up Appointment: Yes Clinical Summary of Care: Provided on 03/09/2021 Form Type Recipient Paper Patient Patient Electronic Signature(s) Signed: 03/09/2021 4:35:23 PM By: Lorrin Jackson Entered By: Lorrin Jackson on 03/09/2021 15:36:36 -------------------------------------------------------------------------------- Lower Extremity Assessment Details Patient Name: Date of Service: RAYSSA, ATHA 03/09/2021 2:45 PM Medical Record Number: [id number] Patient Account Number: [id number] Date of Birth/Sex: Treating RN: Jan 10, 2001 (21 y.o. Sue Lush Primary Care Jadah Bobak: Pricilla Holm Other Clinician: Referring Tabia Landowski: Treating Wallace Cogliano/Extender: Verlene Mayer in Treatment: 0 Electronic Signature(s) Signed: 03/09/2021 4:35:23 PM By: Lorrin Jackson Entered By: Lorrin Jackson on 03/09/2021 14:53:44 -------------------------------------------------------------------------------- Multi Wound Chart Details Patient Name: Date of Service: ALIVIANA, BURDELL 03/09/2021 2:45 PM Medical Record Number: [id number] Patient Account Number: [id number] Date of Birth/Sex: Treating RN: 2000-05-11 (21 y.o. Sue Lush Primary Care Lowen Mansouri: Pricilla Holm Other  Clinician: Referring Jakyri Brunkhorst: Treating Melvina Pangelinan/Extender: Verlene Mayer in Treatment: 0 Vital Signs Height(in): Pulse(bpm): 101 Weight(lbs): Blood Pressure(mmHg): 111/74 Body Mass Index(BMI): Temperature(F): 97.3 Respiratory Rate(breaths/min): 16 Wound Assessments Treatment Notes Electronic Signature(s) Signed: 03/09/2021 3:53:07 PM By: Kalman Shan DO Signed: 03/09/2021 4:35:23 PM By: Lorrin Jackson Entered By: Kalman Shan on 03/09/2021 15:43:48 -------------------------------------------------------------------------------- Pain Assessment Details Patient Name: Date of Service: MASHAWN, [name] 03/09/2021 2:45 PM Medical Record Number: [id number] Patient Account Number: [id number] Date of Birth/Sex: Treating RN: August 21, 2000 (21 y.o. Sue Lush Primary Care Shirlene Andaya: Pricilla Holm Other Clinician: Referring Afshin Chrystal: Treating Rileigh Kawashima/Extender: Verlene Mayer in Treatment: 0 Active Problems Location of Pain Severity and Description of Pain Patient Has Paino Yes Site Locations  Pain Location: Pain Location: Pain in Ulcers With Dressing Change: Yes Duration of the Pain. Constant / Intermittento Intermittent Rate the pain. Current Pain Level: 7 Character of Pain Describe the Pain: Burning, Tender Pain Management and Medication Current Pain Management: Medication: Yes Cold Application: No Rest: Yes Massage: No Activity: No T.E.N.S.: No Heat Application: No Leg drop or elevation: No Is the Current Pain Management Adequate: Adequate How does your wound impact your activities of daily livingo Sleep: No Bathing: No Appetite: No Relationship With Others: No Bladder Continence: No Emotions: No Bowel Continence: No Work: No Toileting: No Drive: No Dressing: No Hobbies: No Electronic Signature(s) Signed: 03/09/2021 4:35:23 PM By: Lorrin Jackson Entered By: Lorrin Jackson on 03/09/2021  14:48:15 -------------------------------------------------------------------------------- Patient/Caregiver Education Details Patient Name: Date of Service: Clarene Essex 1/9/2023andnbsp2:45 PM Medical Record Number: [id number] Patient Account Number: [id number] Date of Birth/Gender: Treating RN: September 11, 2000 (21 y.o. Sue Lush Primary Care Physician: Pricilla Holm Other Clinician: Referring Physician: Treating Physician/Extender: Verlene Mayer in Treatment: 0 Education Assessment Education Provided To: Patient Education Topics Provided Wound/Skin Impairment: Methods: Demonstration, Explain/Verbal, Printed Responses: State content correctly Electronic Signature(s) Signed: 03/09/2021 4:35:23 PM By: Lorrin Jackson Entered By: Lorrin Jackson on 03/09/2021 15:00:33 -------------------------------------------------------------------------------- [place] Details Patient Name: Date of Service: ASPEN, LAWRANCE 03/09/2021 2:45 PM Medical Record Number: [id number] Patient Account Number: [id number] Date of Birth/Sex: Treating RN: 10/04/2000 (21 y.o. Sue Lush Primary Care Mykalah Saari: Pricilla Holm Other Clinician: Referring Stesha Neyens: Treating Sonda Coppens/Extender: Verlene Mayer in Treatment: 0 Vital Signs Time Taken: 14:48 Temperature (F): 97.3 Pulse (bpm): 101 Respiratory Rate (breaths/min): 16 Blood Pressure (mmHg): 111/74 Reference Range: 80 - 120 mg / dl Electronic Signature(s) Signed: 03/09/2021 4:35:23 PM By: Lorrin Jackson Entered By: Lorrin Jackson on 03/09/2021 14:48:53

## 2021-03-09 NOTE — Progress Notes (Signed)
Deanna Levine, Deanna Levine ([id number]) Visit Report for 03/09/2021 Abuse/Suicide Risk Screen Details Patient Name: Date of Service: Deanna Levine, Deanna Levine 03/09/2021 2:45 PM Medical Record Number: [id number] Patient Account Number: [id number] Date of Birth/Sex: Treating RN: 2000-11-30 (21 y.o. Sue Lush Primary Care Zaylynn Rickett: Pricilla Holm Other Clinician: Referring Kateryna Grantham: Treating Yaira Bernardi/Extender: Verlene Mayer in Treatment: 0 Abuse/Suicide Risk Screen Items Answer ABUSE RISK SCREEN: Has anyone close to you tried to hurt or harm you recentlyo No Do you feel uncomfortable with anyone in your familyo No Has anyone forced you do things that you didnt want to doo No Electronic Signature(s) Signed: 03/09/2021 4:35:23 PM By: Lorrin Jackson Entered By: Lorrin Jackson on 03/09/2021 14:49:51 -------------------------------------------------------------------------------- Activities of Daily Living Details Patient Name: Date of Service: Deanna Levine, Deanna Levine 03/09/2021 2:45 PM Medical Record Number: [id number] Patient Account Number: [id number] Date of Birth/Sex: Treating RN: 10-21-2000 (21 y.o. Sue Lush Primary Care Carlton Sweaney: Pricilla Holm Other Clinician: Referring Charrise Lardner: Treating Keyanni Whittinghill/Extender: Verlene Mayer in Treatment: 0 Activities of Daily Living Items Answer Activities of Daily Living (Please select one for each item) Drive Automobile Not Able T Medications ake Completely Able Use T elephone Completely Able Care for Appearance Completely Able Use T oilet Completely Able Bath / Shower Completely Able Dress Self Completely Able Feed Self Completely Able Walk Completely Able Get In / Out Bed Completely Able Housework Completely Able Prepare Meals Completely [place] for Self Completely Able Electronic Signature(s) Signed: 03/09/2021 4:35:23 PM By: Lorrin Jackson Entered  By: Lorrin Jackson on 03/09/2021 14:50:23 -------------------------------------------------------------------------------- Education Screening Details Patient Name: Date of Service: Deanna Levine, Deanna Levine 03/09/2021 2:45 PM Medical Record Number: [id number] Patient Account Number: [id number] Date of Birth/Sex: Treating RN: [birth year] (21 y.o. Sue Lush Primary Care Jesseca Marsch: Pricilla Holm Other Clinician: Referring Ehren Berisha: Treating Deniro Laymon/Extender: Verlene Mayer in Treatment: 0 Primary Learner Assessed: Patient Learning Preferences/Education Level/Primary Language Learning Preference: Explanation, Demonstration, Printed Material Highest Education Level: High School Preferred Language: English Cognitive Barrier Language Barrier: No Translator Needed: No Memory Deficit: No Emotional Barrier: No Cultural/Religious Beliefs Affecting Medical Care: No Physical Barrier Impaired Vision: No Impaired Hearing: No Decreased Hand dexterity: No Knowledge/Comprehension Knowledge Level: High Comprehension Level: High Ability to understand written instructions: High Ability to understand verbal instructions: High Motivation Anxiety Level: Calm Cooperation: Cooperative Education Importance: Acknowledges Need Interest in Health Problems: Asks Questions Perception: Coherent Willingness to Engage in Self-Management High Activities: Readiness to Engage in Self-Management High Activities: Electronic Signature(s) Signed: 03/09/2021 4:35:23 PM By: Lorrin Jackson Entered By: Lorrin Jackson on 03/09/2021 14:50:58 -------------------------------------------------------------------------------- Fall Risk Assessment Details Patient Name: Date of Service: Deanna Levine, Deanna Levine 03/09/2021 2:45 PM Medical Record Number: [id number] Patient Account Number: [id number] Date of Birth/Sex: Treating RN: 2000/08/29 (21 y.o. Sue Lush Primary Care Jaking Thayer:  Pricilla Holm Other Clinician: Referring Milagro Belmares: Treating Kyra Laffey/Extender: Verlene Mayer in Treatment: 0 Fall Risk Assessment Items Have you had 2 or more falls in the last 12 monthso 0 No Have you had any fall that resulted in injury in the last 12 monthso 0 No FALLS RISK SCREEN History of falling - immediate or within 3 months 0 No Secondary diagnosis (Do you have 2 or more medical diagnoseso) 0 No Ambulatory aid None/bed rest/wheelchair/nurse 0 Yes Crutches/cane/walker 0 No Furniture 0 No Intravenous therapy Access/Saline/Heparin Lock 0 No Gait/Transferring Normal/ bed rest/ wheelchair 0 Yes Weak (short steps with or without shuffle, stooped but able to lift head while walking,  may seek 0 No support from furniture) Impaired (short steps with shuffle, may have difficulty arising from chair, head down, impaired 0 No balance) Mental Status Oriented to own ability 0 Yes Electronic Signature(s) Signed: 03/09/2021 4:35:23 PM By: Lorrin Jackson Entered By: Lorrin Jackson on 03/09/2021 14:51:22 -------------------------------------------------------------------------------- Foot Assessment Details Patient Name: Date of Service: Deanna Levine, Deanna Levine 03/09/2021 2:45 PM Medical Record Number: [id number] Patient Account Number: [id number] Date of Birth/Sex: Treating RN: 01/02/2001 (21 y.o. Sue Lush Primary Care Gari Hartsell: Pricilla Holm Other Clinician: Referring Havier Deeb: Treating Vinicius Brockman/Extender: Verlene Mayer in Treatment: 0 Foot Assessment Items Site Locations + = Sensation present, - = Sensation absent, C = Callus, U = Ulcer R = Redness, W = Warmth, M = Maceration, PU = Pre-ulcerative lesion F = Fissure, S = Swelling, D = Dryness Assessment Right: Left: Other Deformity: No No Prior Foot Ulcer: No No Prior Amputation: No No Charcot Joint: No No Ambulatory Status: Gait: Notes N/A: Arm  wound Electronic Signature(s) Signed: 03/09/2021 4:35:23 PM By: Lorrin Jackson Entered By: Lorrin Jackson on 03/09/2021 14:52:05 -------------------------------------------------------------------------------- Nutrition Risk Screening Details Patient Name: Date of Service: Deanna Levine, Deanna Levine 03/09/2021 2:45 PM Medical Record Number: [id number] Patient Account Number: [id number] Date of Birth/Sex: Treating RN: Jun 16, 2000 (21 y.o. Sue Lush Primary Care Rahil Passey: Pricilla Holm Other Clinician: Referring Katherleen Folkes: Treating Izora Benn/Extender: Verlene Mayer in Treatment: 0 Height (in): Weight (lbs): Body Mass Index (BMI): Nutrition Risk Screening Items Score Screening NUTRITION RISK SCREEN: I have an illness or condition that made me change the kind and/or amount of food I eat 0 No I eat fewer than two meals per day 0 No I eat few fruits and vegetables, or milk products 0 No I have three or more drinks of beer, liquor or wine almost every day 0 No I have tooth or mouth problems that make it hard for me to eat 0 No I don't always have enough money to buy the food I need 0 No I eat alone most of the time 0 No I take three or more different prescribed or over-the-counter drugs a day 1 Yes Without wanting to, I have lost or gained 10 pounds in the last six months 0 No I am not always physically able to shop, cook and/or feed myself 0 No Nutrition Protocols Good Risk Protocol 0 No interventions needed Moderate Risk Protocol High Risk Proctocol Risk Level: Good Risk Score: 1 Electronic Signature(s) Signed: 03/09/2021 4:35:23 PM By: Lorrin Jackson Entered By: Lorrin Jackson on 03/09/2021 14:51:44

## 2021-03-11 LAB — VON WILLEBRAND COMPREHENSIVE PANEL
Factor-VIII Activity: 102 % normal (ref 50–180)
Ristocetin Co-Factor: 57 % normal (ref 42–200)
Von Willebrand Antigen, Plasma: 68 % (ref 50–217)
aPTT: 30 s (ref 23–32)
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
## 2021-03-12 ENCOUNTER — Ambulatory Visit (INDEPENDENT_AMBULATORY_CARE_PROVIDER_SITE_OTHER): Payer: 59 | Admitting: Pharmacist

## 2021-03-12 ENCOUNTER — Other Ambulatory Visit: Payer: Self-pay

## 2021-03-12 VITALS — BP 122/80 | Wt 219.2 lb

## 2021-03-12 DIAGNOSIS — E11628 Type 2 diabetes mellitus with other skin complications: Secondary | ICD-10-CM

## 2021-03-12 LAB — POCT GLUCOSE (DEVICE FOR HOME USE): POC Glucose: 125 mg/dl — AB (ref 70–99)

## 2021-03-12 MED ORDER — MOUNJARO 7.5 MG/0.5ML ~~LOC~~ SOAJ: 7.5000 mg | SUBCUTANEOUS | 1 refills | Status: DC

## 2021-03-13 ENCOUNTER — Other Ambulatory Visit: Payer: 59

## 2021-03-13 ENCOUNTER — Encounter: Payer: Self-pay | Admitting: Internal Medicine

## 2021-03-13 ENCOUNTER — Encounter: Payer: 59 | Admitting: Physician Assistant

## 2021-03-17 ENCOUNTER — Other Ambulatory Visit: Payer: Self-pay

## 2021-03-17 ENCOUNTER — Ambulatory Visit: Payer: 59

## 2021-03-17 DIAGNOSIS — Z09 Encounter for follow-up examination after completed treatment for conditions other than malignant neoplasm: Secondary | ICD-10-CM

## 2021-03-17 NOTE — Progress Notes (Signed)
CASE MANAGEMENT VISIT  Session Start time: 930am  Session End time: 1130am Total time:  120  minutes  Type of Service:CASE MANAGEMENT Interpretor:No. Interpretor Name and Language: n/a  Reason for referral Deanna Levine was referred by Dr. Henrene Pastor for assistance applying for medicaid   Summary of Today's Visit: [facility] Coordinator met with mom and patient today. Application completed for Medicaid as well as SNAP per mom request. Also included documentation from genetics and podiatry, consent for [facility] and DSS, documentation confirming that SSI application has been submitted and details re: white sutton syndrome. All documentation was provided by mom today in office.  All info faxed to DSS attention to Juanell Fairly. Mom to drop off paper copies to DSS today as well.  No other needs at this time per mom.  Plan for Next Visit: Phone follow up in 3 weeks.   [place] Coordinator

## 2021-03-18 ENCOUNTER — Encounter (HOSPITAL_BASED_OUTPATIENT_CLINIC_OR_DEPARTMENT_OTHER): Payer: 59 | Admitting: Internal Medicine

## 2021-03-18 DIAGNOSIS — L732 Hidradenitis suppurativa: Secondary | ICD-10-CM | POA: Diagnosis not present

## 2021-03-18 NOTE — Progress Notes (Signed)
Deanna Levine ([id number]) Visit Report for 03/18/2021 HPI Details Patient Name: Date of Service: Deanna Levine 03/18/2021 2:00 PM Medical Record Number: [id number] Patient Account Number: [id number] Date of Birth/Sex: Treating RN: March 13, 2000 (21 y.o. Deanna Levine, Lauren Primary Care Provider: Pricilla Holm Other Clinician: Referring Provider: Treating Provider/Extender: Charlie Pitter in Treatment: 1 History of Present Illness HPI Description: 06/04/2020 patient presents today for initial evaluation here in clinic concerning issues she has been having with her under her arms, under the breast, in the suprapubic region, and on her upper back as well with issues which are consistent with hidradenitis though I think it is more on the milder side. Her and her mother have been on top of this trying to keep things under control. Her mother also has issues with hidradenitis though not severely either. This does seem to potentially be something that runs in the family. Nonetheless fortunately there does not appear to be any evidence of significant infection at this point. The wound which is actually under the right breast location actually does not appear to be infected right now which is great news. With that being said I do see evidence currently of scarring in multiple locations which show that the patient has obviously had issues in the past that several other locations as well. All in the same regions. Nonetheless she does not have a tremendous amount of pain noted at this point which is good news. She does have some mild mental disability according to her mother she does have mild dysphagia as well which requires that she has to either take liquid or crushed antibiotics when she does get those. Her most recent hemoglobin A1c was 6.6. Currently this episode has been going on for about 6 months ago again 3 years as the total length of time she has been dealing  with this. She has never seen a hidradenitis clinic. 06/18/20 upon evaluation today patient appears to be doing in my opinion a little better compared to her previous evaluation. There does not appear to be any signs of infection which is great news and overall very pleased with where things stand today. She has not heard from the hidradenitis clinic at [facility] yet although we made that referral last time she was here 2 weeks ago. 5//22 upon evaluation today patient appears to be doing well with regard to her wounds. I do think the Hydrofera Blue has been doing well for her. She has a new area open up on the left/medial chest just inside of her breast. With that being said this does not appear to be too significant at this point which is great news. With that being said she unfortunately is continuing to have new areas show up but again these are minimal and seem to be under fairly good control which is great as well. I do not see any signs of active infection at this point which is great news. She did get a call from [facility] from the hidradenitis clinic. With that being said she is on a wait list it sounds like at this point. 07/30/2020 upon evaluation today patient appears to be doing well with regard to her wounds for the most part. There are some areas that have actually healed quite nicely. With that being said she has some regions that do not seem to be healing nearly as effectively at this point. Overall I am pleased with what we are seeing for the most part but at the same time I do  feel like that the new areas that pop up or not good she has an area underneath her underarm on the left as well. She has been on doxycycline. She still on the wait list at [facility] she has heard nothing from that according to her mother. In regard to the dermatologist that they have through [facility] that she will be seeing in November they are on a wait list there in case anybody cancels but have not heard anything  from that. 09/03/2020 upon evaluation today patient appears to be doing well with regard to her the hidradenitis areas underneath the breast location. She has been tolerating the dressing changes without complication with been using Hydrofera Blue which is doing a great job. She does not have any open areas under the underarms either at this point. Everything seems to be doing quite well which is great. 10/01/2020 upon evaluation today patient appears to be doing well with regard to her wounds. Again she has mainly been seen for issues with hidradenitis and to be honest everything appears to be pretty much filled out at this point. There does not appear to be any evidence of active infection which is great news and overall I am extremely pleased with where things stand today. No fevers, chills, nausea, vomiting, or diarrhea. Readmission 03/09/2021; patient presents because she states that a few weeks ago she had reopening of areas underneath her breast and right axilla related to her hidradenitis. She follows with dermatology for this issue. She states these areas have healed and she has no open wounds today. She states she uses Hibiclens and clindamycin solution to help maintain the area clean. She was supposed to be on Keflex suspension however because she had surgery this was held and when she went back to retake it it had expired. She has not followed up with her dermatologist for this. She has not heard back from the hidradenitis clinic at [facility] that we referred her to. 1/18; patient was readmitted to the clinic by Dr. Heber Milton last week at which time she had no open areas. She came back because some areas on the undersurface of both breasts have opened. She has fairly clear hidradenitis for which she has seen Dr. Leonie Green at [facility] dermatology. She has been using Hibiclens on the areas. She has an appointment with Dr. Leonie Green in March Electronic Signature(s) Signed: 03/18/2021 3:43:46 PM By:  Linton Ham MD Entered By: Linton Ham on 03/18/2021 15:09:38 -------------------------------------------------------------------------------- Physical Exam Details Patient Name: Date of Service: Deanna Levine, Deanna Levine 03/18/2021 2:00 PM Medical Record Number: [id number] Patient Account Number: [id number] Date of Birth/Sex: Treating RN: [birth year] (21 y.o. Deanna Levine, Lauren Primary Care Provider: Pricilla Holm Other Clinician: Referring Provider: Treating Provider/Extender: Charlie Pitter in Treatment: 1 Constitutional Sitting or standing Blood Pressure is within target range for patient.. Pulse regular and within target range for patient.Marland Kitchen Respirations regular, non-labored and within target range.. Temperature is normal and within the target range for the patient.Marland Kitchen Appears in no distress. Notes Wound exam; the patient has 3 or 4 areas on mirror-image's medial sides of both breasts. There is an open area on the right the breasts are nodules. He also says some nodules on the right shoulder and back however this would not be hidradenitis. Electronic Signature(s) Signed: 03/18/2021 3:43:46 PM By: Linton Ham MD Entered By: Linton Ham on 03/18/2021 15:10:37 -------------------------------------------------------------------------------- Physician Orders Details Patient Name: Date of Service: Deanna Levine, Deanna Levine 03/18/2021 2:00 PM Medical Record Number: [id number] Patient Account Number: [id number]  Date of Birth/Sex: Treating RN: [birth year] (21 y.o. Elam Dutch Primary Care Provider: Pricilla Holm Other Clinician: Referring Provider: Treating Provider/Extender: Charlie Pitter in Treatment: 1 Verbal / Phone Orders: No Diagnosis Coding ICD-10 Coding Code Description L73.2 Hidradenitis suppurativa E11.628 Type 2 diabetes mellitus with other skin complications Follow-up Appointments ppointment in 2 weeks.  - Dr. Dellia Nims Return A Non Wound Condition Other Non Wound Condition Orders/Instructions: - Continue treatment prescribed by dermatology, Hibiclens and Cleocin-T Wound Treatment Wound #5 - Breast Wound Laterality: Left Prim Dressing: KerraCel Ag Gelling Fiber Dressing, 2x2 in (silver alginate) Every Other Day/30 Days ary Discharge Instructions: Apply silver alginate to wound bed as instructed Secondary Dressing: Bordered Gauze, 2x2 in Every Other Day/30 Days Discharge Instructions: Apply over primary dressing as directed. Wound #6 - Breast Wound Laterality: Right Prim Dressing: KerraCel Ag Gelling Fiber Dressing, 2x2 in (silver alginate) Every Other Day/30 Days ary Discharge Instructions: Apply silver alginate to wound bed as instructed Secondary Dressing: Bordered Gauze, 2x2 in Every Other Day/30 Days Discharge Instructions: Apply over primary dressing as directed. Patient Medications llergies: No Known Drug Allergies A Notifications Medication Indication Start End hidradentits 03/18/2021 clindamycin phosphate DOSE topical 1 % gel - gel topical to affected area bid Electronic Signature(s) Signed: 03/18/2021 3:13:40 PM By: Linton Ham MD Entered By: Linton Ham on 03/18/2021 15:13:39 -------------------------------------------------------------------------------- Problem List Details Patient Name: Date of Service: Deanna Levine, Deanna Levine 03/18/2021 2:00 PM Medical Record Number: [id number] Patient Account Number: [id number] Date of Birth/Sex: Treating RN: [birth year] (21 y.o. Donalda Ewings Primary Care Provider: Pricilla Holm Other Clinician: Referring Provider: Treating Provider/Extender: Charlie Pitter in Treatment: 1 Active Problems ICD-10 Encounter Code Description Active Date MDM Diagnosis L73.2 Hidradenitis suppurativa 03/09/2021 No Yes E11.628 Type 2 diabetes mellitus with other skin complications 11/01/7340 No Yes L98.498 Non-pressure  chronic ulcer of skin of other sites with other specified severity 03/18/2021 No Yes Inactive Problems Resolved Problems Electronic Signature(s) Signed: 03/18/2021 3:43:46 PM By: Linton Ham MD Entered By: Linton Ham on 03/18/2021 15:08:08 -------------------------------------------------------------------------------- Progress Note Details Patient Name: Date of Service: Deanna Levine, Deanna Levine 03/18/2021 2:00 PM Medical Record Number: [id number] Patient Account Number: [id number] Date of Birth/Sex: Treating RN: 09/23/2000 (21 y.o. Deanna Levine, Lauren Primary Care Provider: Pricilla Holm Other Clinician: Referring Provider: Treating Provider/Extender: Charlie Pitter in Treatment: 1 Subjective History of Present Illness (HPI) 06/04/2020 patient presents today for initial evaluation here in clinic concerning issues she has been having with her under her arms, under the breast, in the suprapubic region, and on her upper back as well with issues which are consistent with hidradenitis though I think it is more on the milder side. Her and her mother have been on top of this trying to keep things under control. Her mother also has issues with hidradenitis though not severely either. This does seem to potentially be something that runs in the family. Nonetheless fortunately there does not appear to be any evidence of significant infection at this point. The wound which is actually under the right breast location actually does not appear to be infected right now which is great news. With that being said I do see evidence currently of scarring in multiple locations which show that the patient has obviously had issues in the past that several other locations as well. All in the same regions. Nonetheless she does not have a tremendous amount of pain noted at this point which is good news. She does have some mild  mental disability according to her mother she does have mild  dysphagia as well which requires that she has to either take liquid or crushed antibiotics when she does get those. Her most recent hemoglobin A1c was 6.6. Currently this episode has been going on for about 6 months ago again 3 years as the total length of time she has been dealing with this. She has never seen a hidradenitis clinic. 06/18/20 upon evaluation today patient appears to be doing in my opinion a little better compared to her previous evaluation. There does not appear to be any signs of infection which is great news and overall very pleased with where things stand today. She has not heard from the hidradenitis clinic at [facility] yet although we made that referral last time she was here 2 weeks ago. 5//22 upon evaluation today patient appears to be doing well with regard to her wounds. I do think the Hydrofera Blue has been doing well for her. She has a new area open up on the left/medial chest just inside of her breast. With that being said this does not appear to be too significant at this point which is great news. With that being said she unfortunately is continuing to have new areas show up but again these are minimal and seem to be under fairly good control which is great as well. I do not see any signs of active infection at this point which is great news. She did get a call from [facility] from the hidradenitis clinic. With that being said she is on a wait list it sounds like at this point. 07/30/2020 upon evaluation today patient appears to be doing well with regard to her wounds for the most part. There are some areas that have actually healed quite nicely. With that being said she has some regions that do not seem to be healing nearly as effectively at this point. Overall I am pleased with what we are seeing for the most part but at the same time I do feel like that the new areas that pop up or not good she has an area underneath her underarm on the left as well. She has been on doxycycline. She  still on the wait list at [facility] she has heard nothing from that according to her mother. In regard to the dermatologist that they have through [facility] that she will be seeing in November they are on a wait list there in case anybody cancels but have not heard anything from that. 09/03/2020 upon evaluation today patient appears to be doing well with regard to her the hidradenitis areas underneath the breast location. She has been tolerating the dressing changes without complication with been using Hydrofera Blue which is doing a great job. She does not have any open areas under the underarms either at this point. Everything seems to be doing quite well which is great. 10/01/2020 upon evaluation today patient appears to be doing well with regard to her wounds. Again she has mainly been seen for issues with hidradenitis and to be honest everything appears to be pretty much filled out at this point. There does not appear to be any evidence of active infection which is great news and overall I am extremely pleased with where things stand today. No fevers, chills, nausea, vomiting, or diarrhea. Readmission 03/09/2021; patient presents because she states that a few weeks ago she had reopening of areas underneath her breast and right axilla related to her hidradenitis. She follows with dermatology for this  issue. She states these areas have healed and she has no open wounds today. She states she uses Hibiclens and clindamycin solution to help maintain the area clean. She was supposed to be on Keflex suspension however because she had surgery this was held and when she went back to retake it it had expired. She has not followed up with her dermatologist for this. She has not heard back from the hidradenitis clinic at [facility] that we referred her to. 1/18; patient was readmitted to the clinic by Dr. Heber McKenzie last week at which time she had no open areas. She came back because some areas on the undersurface of both breasts  have opened. She has fairly clear hidradenitis for which she has seen Dr. Leonie Green at [facility] dermatology. She has been using Hibiclens on the areas. She has an appointment with Dr. Leonie Green in March Objective Constitutional Sitting or standing Blood Pressure is within target range for patient.. Pulse regular and within target range for patient.Marland Kitchen Respirations regular, non-labored and within target range.. Temperature is normal and within the target range for the patient.Marland Kitchen Appears in no distress. Vitals Time Taken: 2:40 PM, Height: 64 in, Source: Stated, Weight: 214 lbs, Source: Stated, BMI: 36.7, Temperature: 98.6 F, Pulse: 88 bpm, Respiratory Rate: 18 breaths/min, Blood Pressure: 107/73 mmHg, Capillary Blood Glucose: 205 mg/dl. General Notes: cbg per dexcom at present General Notes: Wound exam; the patient has 3 or 4 areas on mirror-image's medial sides of both breasts. There is an open area on the right the breasts are nodules. He also says some nodules on the right shoulder and back however this would not be hidradenitis. Integumentary (Hair, Skin) Wound #5 status is Open. Original cause of wound was Bump. The date acquired was: 03/11/2021. The wound is located on the Left Breast. The wound measures 0.2cm length x 0.5cm width x 0.1cm depth; 0.079cm^2 area and 0.008cm^3 volume. There is Fat Layer (Subcutaneous Tissue) exposed. There is no tunneling or undermining noted. There is a small amount of serosanguineous drainage noted. The wound margin is flat and intact. There is small (1-33%) red granulation within the wound bed. There is no necrotic tissue within the wound bed. Wound #6 status is Open. Original cause of wound was Bump. The date acquired was: 03/11/2021. The wound is located on the Right Breast. The wound measures 1cm length x 0.9cm width x 0.1cm depth; 0.707cm^2 area and 0.071cm^3 volume. There is Fat Layer (Subcutaneous Tissue) exposed. There is no tunneling or undermining noted.  There is a small amount of sanguinous drainage noted. The wound margin is flat and intact. There is large (67-100%) red granulation within the wound bed. There is no necrotic tissue within the wound bed. Assessment Active Problems ICD-10 Hidradenitis suppurativa Type 2 diabetes mellitus with other skin complications Non-pressure chronic ulcer of skin of other sites with other specified severity Plan Follow-up Appointments: Return Appointment in 2 weeks. - Dr. Dellia Nims Non Wound Condition: Other Non Wound Condition Orders/Instructions: - Continue treatment prescribed by dermatology, Hibiclens and Cleocin-T The following medication(s) was prescribed: clindamycin phosphate topical 1 % gel gel topical to affected area bid for hidradentits starting 03/18/2021 WOUND #5: - Breast Wound Laterality: Left Prim Dressing: KerraCel Ag Gelling Fiber Dressing, 2x2 in (silver alginate) Every Other Day/30 Days ary Discharge Instructions: Apply silver alginate to wound bed as instructed Secondary Dressing: Bordered Gauze, 2x2 in Every Other Day/30 Days Discharge Instructions: Apply over primary dressing as directed. WOUND #6: - Breast Wound Laterality: Right Prim Dressing: KerraCel Ag  Gelling Fiber Dressing, 2x2 in (silver alginate) Every Other Day/30 Days ary Discharge Instructions: Apply silver alginate to wound bed as instructed Secondary Dressing: Bordered Gauze, 2x2 in Every Other Day/30 Days Discharge Instructions: Apply over primary dressing as directed. 1. Active hidradenitis on the medial undersurface of both breasts there is an open area medially on the right the rest of her nodules. 2. Looking at her axilla it is quite clear that she has had sinus tracts but overall her skin does not look too bad. There is nothing open in the axilla but some scarring 3. I am going to use topical clindamycin gel twice a day silver alginate. I suspect we should be able to get this under control although hidradenitis  is unpredictable. 4. She has appointment with Dr. Leonie Green in March but as usual it is hard to get people into clinic for acute things Electronic Signature(s) Signed: 03/18/2021 3:43:46 PM By: Linton Ham MD Entered By: Linton Ham on 03/18/2021 15:14:59 -------------------------------------------------------------------------------- SuperBill Details Patient Name: Date of Service: Deanna Levine, Deanna Levine 03/18/2021 Medical Record Number: [id number] Patient Account Number: [id number] Date of Birth/Sex: Treating RN: 06-Sep-2000 (21 y.o. Deanna Levine, Lauren Primary Care Provider: Pricilla Holm Other Clinician: Referring Provider: Treating Provider/Extender: Charlie Pitter in Treatment: 1 Diagnosis Coding ICD-10 Codes Code Description L73.2 Hidradenitis suppurativa E11.628 Type 2 diabetes mellitus with other skin complications I50.277 Non-pressure chronic ulcer of skin of other sites with other specified severity Facility Procedures CPT4 Code: [id number] Description: 67209 - WOUND CARE VISIT-LEV 4 EST PT Modifier: Quantity: 1 Physician Procedures Electronic Signature(s) Signed: 03/18/2021 3:43:46 PM By: Linton Ham MD Signed: 03/18/2021 4:46:25 PM By: Baruch Gouty RN, BSN Entered By: Baruch Gouty on 03/18/2021 15:18:02

## 2021-03-19 ENCOUNTER — Ambulatory Visit (INDEPENDENT_AMBULATORY_CARE_PROVIDER_SITE_OTHER): Payer: 59 | Admitting: Family

## 2021-03-19 ENCOUNTER — Encounter: Payer: Self-pay | Admitting: Family

## 2021-03-19 ENCOUNTER — Other Ambulatory Visit: Payer: Self-pay

## 2021-03-19 VITALS — BP 114/80 | HR 86 | Ht 64.17 in | Wt 216.0 lb

## 2021-03-19 DIAGNOSIS — Q8789 Other specified congenital malformation syndromes, not elsewhere classified: Secondary | ICD-10-CM | POA: Diagnosis not present

## 2021-03-19 DIAGNOSIS — N920 Excessive and frequent menstruation with regular cycle: Secondary | ICD-10-CM | POA: Diagnosis not present

## 2021-03-19 DIAGNOSIS — Z975 Presence of (intrauterine) contraceptive device: Secondary | ICD-10-CM | POA: Diagnosis not present

## 2021-03-19 DIAGNOSIS — Z8669 Personal history of other diseases of the nervous system and sense organs: Secondary | ICD-10-CM

## 2021-03-19 NOTE — Progress Notes (Signed)
History was provided by the patient.  Deanna Levine is a 21 y.o. female who is here for menorrhagia with regular cycle, Nexplanon in place.   PCP confirmed? Yes.    [name], MD  Plan from last visit: 1. Insertion of Nexplanon - After counseling on risks and benefits of LARCs including comparing IUD versus Nexpalnon, Deanna Levine and her mother desire Nexpalnon, will place today, consent obtained from patient - Teach back method employed and patient verbalized sufficient understanding of procedure and return precautions - etonogestrel (NEXPLANON) 68 MG IMPL implant; 1 each (68 mg total) by Subdermal route once.  Dispense: 1 each; Refill: 0 - Subdermal Etonogestrel Implant Insertion - etonogestrel (NEXPLANON) implant 68 mg   2. Menorrhagia with regular cycle - Using 7 pads for 5-7 days with mild anemia in December, will obtain labs today to assess for red cell dyscrasia and hormonal etiology, including PCOS in the setting of hydradenitis  - In discuss with Dr. [name] and mother, will pursue work-up as below and defer hematology appointment until labs return and at that time, if concerning labs, then go to hematology  - Hemoglobinopathy evaluation - [facility] COMPREHENSIVE PANEL - PT and PTT - FSH/LH - Prolactin - TSH + free T4 - DHEA-sulfate - Testos,Total,Free and SHBG (Female)   3. Pregnancy examination or test, negative result - Negative  - POCT urine pregnancy   4. 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   Other adverse food reactions, not elsewhere classified, subsequent encounter 08/20/2020   Other allergic rhinitis 08/20/2020   Allergic conjunctivitis of both eyes 08/20/2020   Encounter for general adult medical examination with  abnormal findings 07/03/2020   Leg length discrepancy 06/05/2020   Hidradenitis suppurativa 06/05/2020   Intellectual disability 06/05/2020   Achilles tendon contracture, bilateral    Elevated sedimentation rate 02/07/2020   Pain in right ankle and joints of right foot 02/07/2020   Anemia 12/29/2018   Type 2 diabetes mellitus ([place]) 08/15/2017   Elevated triglycerides with high cholesterol 12/09/2016   Partial epilepsy with impairment of consciousness ([place]) 08/16/2012   Mild intellectual disability 08/16/2012   Laxity of ligament 08/16/2012   Delayed milestones 08/16/2012   Obesity 06/07/2011   Dyspepsia 02/11/2011   Acanthosis nigricans 02/11/2011   Global developmental delay    Petit mal without grand mal seizures ([place])    Dyspraxia     Current Outpatient Medications on File Prior to Visit  Medication Sig Dispense Refill   blood glucose meter kit and supplies Dispense based on patient and insurance preference. Use up to four times daily as directed. (FOR ICD-10 E10.9, E11.9). 1 each 0   Blood Glucose Monitoring Suppl (ONETOUCH VERIO) w/Device KIT 1 kit by Does not apply route as directed. Use glucometer to check sugar up to 3x per day (E11.9) 1 kit 3   cholecalciferol (VITAMIN D3) 25 MCG (1000 UNIT) tablet Take 1,000 Units by mouth daily.     etonogestrel (NEXPLANON) 68 MG IMPL implant 1 each (68 mg total) by Subdermal route once. 1 each 0   Menaquinone-7 (VITAMIN K2) 100 MCG CAPS Take 100 mcg by mouth daily.     Multiple Vitamins-Minerals (MULTIVITAMIN ADULT) CHEW Chew 1 tablet by mouth daily.     olopatadine (PATANOL) 0.1 % ophthalmic  solution Place 1 drop into both eyes 2 (two) times daily as needed for allergies.     silver sulfADIAZINE (SILVADENE) 1 % cream Apply 1 application topically daily. 50 g 0   tirzepatide (MOUNJARO) 7.5 MG/0.5ML Pen Inject 7.5 mg into the skin once a week. 2 mL 1   acetaminophen (LIQUID ACETAMINOPHEN) 160 MG/5ML liquid Take 20 mLs (640 mg total) by  mouth every 6 (six) hours as needed for pain. (Patient not taking: Reported on 03/12/2021) 473 mL 0   azelastine (ASTELIN) 0.1 % nasal spray Place 1 spray into both nostrils 2 (two) times daily. Use in each nostril as directed (Patient not taking: Reported on 03/12/2021) 30 mL 12   cetirizine (ZYRTEC) 10 MG tablet Take 10 mg by mouth as needed. (Patient not taking: Reported on 03/12/2021)     chlorhexidine (HIBICLENS) 4 % external liquid Apply topically daily as needed. (Patient not taking: Reported on 03/12/2021) 120 mL 0   Continuous Blood Gluc Receiver (DEXCOM G6 RECEIVER) DEVI 1 Device by Does not apply route as directed. (Patient not taking: Reported on 03/12/2021) 1 each 2   Continuous Blood Gluc Sensor (DEXCOM G6 SENSOR) MISC Inject 1 applicator into the skin as directed. (change sensor every 10 days) (Patient not taking: Reported on 03/12/2021) 3 each 11   Continuous Blood Gluc Transmit (DEXCOM G6 TRANSMITTER) MISC Inject 1 Device into the skin as directed. (re-use up to 8x with each new sensor) (Patient not taking: Reported on 03/12/2021) 1 each 3   fluticasone (FLONASE) 50 MCG/ACT nasal spray SHAKE LIQUID AND USE 2 SPRAYS IN EACH NOSTRIL DAILY (Patient not taking: Reported on 03/12/2021) 16 g 6   No current facility-administered medications on file prior to visit.    No Known Allergies  Physical Exam:    Vitals:   03/19/21 0918  BP: 114/80  Pulse: 86  Weight: 216 lb (98 kg)  Height: 5' 4.17" (1.63 m)    Growth percentile SmartLinks can only be used for patients less than 17 years old. No LMP recorded.  Physical Exam Vitals reviewed.  HENT:     Mouth/Throat:     Pharynx: Oropharynx is clear.  Eyes:     General: No scleral icterus.    Pupils: Pupils are equal, round, and reactive to light.     Comments: Wearing corrective lenses  Cardiovascular:     Rate and Rhythm: Normal rate and regular rhythm.     Heart sounds: No murmur heard. Pulmonary:     Effort: Pulmonary effort is  normal.  Musculoskeletal:        General: No swelling. Normal range of motion.     Cervical back: Normal range of motion.  Skin:    General: Skin is warm and dry.     Findings: No rash.     Comments: Implant palpable in RUE  Neurological:     Mental Status: Deanna Levine is alert.  Psychiatric:        Mood and Affect: Mood normal.     Assessment/Plan:  1. Menorrhagia with regular cycle 2. Nexplanon in place 3. 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 Site is well-healed and implant is palpable in correct position. Deanna Levine has experience menstrual suppression with implant with no cramping or spotting. Deanna Levine would like  to return in 3 months for follow-up. We reviewed her labs from last visit which were normal.  DHEAS, testosterone, TSH, free T4, prolactin, FSH/LH, VWB panel all normal. PT, PTT and hemoglobinopathy evaluation show still active. Reassuring no bleeding with implant.   Return in 3 months or sooner if needed.

## 2021-03-20 ENCOUNTER — Telehealth: Payer: Self-pay | Admitting: Podiatry

## 2021-03-20 NOTE — Telephone Encounter (Signed)
Patient is complaining of pain on the ball of her right foot. [name] able is to walk, she is requesting a walking boot for for the right foot.  Please advise

## 2021-03-23 ENCOUNTER — Ambulatory Visit (INDEPENDENT_AMBULATORY_CARE_PROVIDER_SITE_OTHER): Payer: 59 | Admitting: Family

## 2021-03-23 ENCOUNTER — Encounter: Payer: Self-pay | Admitting: Family

## 2021-03-23 ENCOUNTER — Other Ambulatory Visit: Payer: Self-pay

## 2021-03-23 VITALS — BP 110/68 | HR 95 | Temp 98.0°F | Resp 20 | Ht 64.0 in | Wt 219.4 lb

## 2021-03-23 DIAGNOSIS — H1013 Acute atopic conjunctivitis, bilateral: Secondary | ICD-10-CM | POA: Diagnosis not present

## 2021-03-23 DIAGNOSIS — J3089 Other allergic rhinitis: Secondary | ICD-10-CM

## 2021-03-23 DIAGNOSIS — R21 Rash and other nonspecific skin eruption: Secondary | ICD-10-CM | POA: Diagnosis not present

## 2021-03-23 DIAGNOSIS — T781XXD Other adverse food reactions, not elsewhere classified, subsequent encounter: Secondary | ICD-10-CM

## 2021-03-23 NOTE — Progress Notes (Signed)
[facility] [place] [address] Dept: [phone]  FOLLOW UP NOTE  Patient ID: Deanna Levine, female    DOB: 2000-08-07  Age: 21 y.o. MRN: [id number] Date of Office Visit: 03/23/2021  Assessment  Chief Complaint: Allergic Rhinitis  (good)  HPI Deanna Levine is a 21 year old female who presents today for follow-up of allergic rhinitis, allergic conjunctivitis, and other adverse food reactions.  She was last seen on August 20, 2020 by Dr. Maudie Mercury.  She reports since her last office visit she has been diagnosed with obstructive sleep apnea and White-Sutton syndrome.  She also reports that on January 16, 2021 she had surgery on her left foot.  Allergic rhinitis is reported as controlled with Zyrtec as needed, Flonase 1 spray each nostril twice a day, and azelastine 1 to 2 sprays each nostril twice a day.  She does not use saline nasal spray or saline nasal rinse.  She denies rhinorrhea, nasal congestion, and postnasal drip.  She has not had any sinus infections since we last saw her.  Allergic conjunctivitis is reported as controlled.  She does report watery eyes when she is around onions she denies itchy eyes.  She continues to avoid milk and reports that she drinks lactose-free milk.  She reports an itchy rash on her bilateral arms and back.  She has seen dermatology for this in the past and has an upcoming appointment on May 06, 2021.  She reports that this has been ongoing for quite a while and she has not tried anything other than cocoa butter.  After doing a chart review in Epic she saw dermatology on December 31, 2020 and was diagnosed with Hidradenitis suppurativa, eczema and folliculitis.  "Significant exam findings include:  -- multiple nodules and nodular plaques of b/l axillae and under the breasts with sinus tracts -- eczematous lichenified plaques of the fdorsal arms --acanthosis nigricans of the neck and axillae --PIH of the upper shoulders and back  There are no  other significant exam findings on patient's skin exam today  ASSESSMENT:  1. Hidradenitis suppurativa clindamycin (CLEOCIN-T) 1 % external solution  cephalexin (KEFLEX) 250 mg/5 mL suspension  chlorhexidine (HIBICLENS) 4 % external liquid  2. Eczema, unspecified type triamcinolone (KENALOG) 0.1 % ointment  3. Folliculitis   Hurley stage 3  PLAN: 1. Extensive counseling was provided to the patient regarding the diagnosis and any treatment options for the diagnoses above. 2. For eczema: a. Recommend free and clear regimen b. Triamcinolone to the affected area twice daily PRN 3. For folliculitis: a. Discussed is mostly [place] today. Can apply the clindamycin to new lesions. 4. For HS: a. Hibiclens wash daily b. Topical clindamycin to the affected areas twice daily c. Start keflex solution 500 mg (71m) PO BID (pt has difficulty with swallowing pills given genetic disorder that affects her MSK system) d. We may need to consider Humira in the future given the severity of HS and difficulty with oral medications.  5. Use mild soaps and cleanser - Cerave, Cetaphil, and Vanicream cleansing bar are good choices. It is also best to switch to Free and Clear detergents and avoid using scented fabric softeners or dryer sheets.  6. Return in about 4 months (around 04/30/2021). - but sooner as needed. "  Drug Allergies:  No Known Allergies  Review of Systems: Review of Systems  Constitutional:  Negative for chills and fever.  HENT:         Denies rhinorrhea, nasal congestion, and postnasal drip  Eyes:  Reports watery eyes when she is around onions.  Denies itchy eyes  Respiratory:  Negative for cough, shortness of breath and wheezing.   Cardiovascular:  Negative for chest pain and palpitations.  Gastrointestinal:        Denies heartburn or reflux symptoms  Genitourinary:  Negative for frequency.  Skin:  Positive for itching and rash.  Neurological:  Negative for headaches.   Endo/Heme/Allergies:  Positive for environmental allergies.    Physical Exam: BP 110/68 (Cuff Size: Large)    Pulse 95    Temp 98 F (36.7 C) (Temporal)    Resp 20    Ht 5' 4"  (1.626 m)    Wt 219 lb 6.4 oz (99.5 kg)    SpO2 100%    BMI 37.66 kg/m    Physical Exam Constitutional:      Appearance: Normal appearance.  HENT:     Head: Normocephalic and atraumatic.     Comments: Pharynx normal, eyes normal, ears unable to see bilateral tympanic membrane due to cerumen.  Nose: Bilateral lower turbinates moderately edematous and slightly erythematous with no drainage noted    Right Ear: Ear canal and external ear normal.     Left Ear: Ear canal and external ear normal.     Mouth/Throat:     Mouth: Mucous membranes are moist.     Pharynx: Oropharynx is clear.  Eyes:     Conjunctiva/sclera: Conjunctivae normal.  Cardiovascular:     Rate and Rhythm: Regular rhythm.     Heart sounds: Normal heart sounds.  Pulmonary:     Effort: Pulmonary effort is normal.     Breath sounds: Normal breath sounds.     Comments: Lungs clear to auscultation Musculoskeletal:     Cervical back: Neck supple.  Skin:    General: Skin is warm.     Comments: Dry skin noted on bilateral arms. Hyperpigmented papules noted on bilateral scapular/shoulder region  Neurological:     Mental Status: [name] alert and oriented to person, place, and time.  Psychiatric:        Mood and Affect: Mood normal.        Behavior: Behavior normal.        Thought Content: Thought content normal.        Judgment: Judgment normal.    Diagnostics:  None  Assessment and Plan: 1. Other allergic rhinitis   2. Other adverse food reactions, not elsewhere classified, subsequent encounter   3. Allergic conjunctivitis of both eyes   4. Rash and nonspecific skin eruption     No orders of the defined types were placed in this encounter.   Patient Instructions  Seasonal an perennial allergic rhinitis ( June 2022 skin test positive  to grass, weed pollen, ragweed, trees, cat, dog, cockroach, and dust mites) Continue environmental control measures as below. Use over the counter antihistamines such as Zyrtec (cetirizine), Claritin (loratadine), Allegra (fexofenadine), or Xyzal (levocetirizine) daily as needed. May take twice a day during allergy flares. May switch antihistamines every few months. Use Flonase (fluticasone) nasal spray 1 spray per nostril twice a day as needed for nasal congestion.  Use azelastine nasal spray 1-2 sprays per nostril twice a day as needed for runny nose/drainage. Nasal saline spray (i.e., Simply Saline) or nasal saline lavage (i.e., NeilMed) is recommended as needed and prior to medicated nasal sprays. Consider allergy injections for long term control if above medications do not help the symptoms    Adverse food reaction ( negative skin test to  milk and casein in June 2022) Lactose intolerance: May use lactose free milk or take a lactaid pill right before consuming anything with dairy. I don't think you are allergic to milk protein as you have been eating cheese with no issues.   Rash Continue to follow up with dermatology and follow their plan Recommend increasing moisturizing program to twice a day with Cerave, Cetaphil or Eucerin  Recommend starting Zyrtec (cetirizine) 10 mg once a day to see if this helps with the itching   Follow up in 6 months or sooner if needed.   Reducing Pollen Exposure Pollen seasons: trees (spring), grass (summer) and ragweed/weeds (fall). Keep windows closed in your home and car to lower pollen exposure.  Install air conditioning in the bedroom and throughout the house if possible.  Avoid going out in dry windy days - especially early morning. Pollen counts are highest between 5 - 10 AM and on dry, hot and windy days.  Save outside activities for late afternoon or after a heavy rain, when pollen levels are lower.  Avoid mowing of grass if you have grass pollen  allergy. Be aware that pollen can also be transported indoors on people and pets.  Dry your clothes in an automatic dryer rather than hanging them outside where they might collect pollen.  Rinse hair and eyes before bedtime.  Pet Allergen Avoidance: Contrary to popular opinion, there are no hypoallergenic breeds of dogs or cats. That is because people are not allergic to an animals hair, but to an allergen found in the animal's saliva, dander (dead skin flakes) or urine. Pet allergy symptoms typically occur within minutes. For some people, symptoms can build up and become most severe 8 to 12 hours after contact with the animal. People with severe allergies can experience reactions in public places if dander has been transported on the pet owners clothing. Keeping an animal outdoors is only a partial solution, since homes with pets in the yard still have higher concentrations of animal allergens. Before getting a pet, ask your allergist to determine if you are allergic to animals. If your pet is already considered part of your family, try to minimize contact and keep the pet out of the bedroom and other rooms where you spend a great deal of time. As with dust mites, vacuum carpets often or replace carpet with a hardwood floor, tile or linoleum. High-efficiency particulate air (HEPA) cleaners can reduce allergen levels over time. While dander and saliva are the source of cat and dog allergens, urine is the source of allergens from rabbits, hamsters, mice and [place] pigs; so ask a non-allergic family member to clean the animals cage. If you have a pet allergy, talk to your allergist about the potential for allergy immunotherapy (allergy shots). This strategy can often provide long-term relief.  Cockroach Allergen Avoidance Cockroaches are often found in the homes of densely populated urban areas, schools or commercial buildings, but these creatures can lurk almost anywhere. This does not mean that  you have a dirty house or living area. Block all areas where roaches can enter the home. This includes crevices, wall cracks and windows.  Cockroaches need water to survive, so fix and seal all leaky faucets and pipes. Have an exterminator go through the house when your family and pets are gone to eliminate any remaining roaches. Keep food in lidded containers and put pet food dishes away after your pets are done eating. Vacuum and sweep the floor after meals, and take out garbage  and recyclables. Use lidded garbage containers in the kitchen. Wash dishes immediately after use and clean under stoves, refrigerators or toasters where crumbs can accumulate. Wipe off the stove and other kitchen surfaces and cupboards regularly.  Control of House Dust Mite Allergen Dust mite allergens are a common trigger of allergy and asthma symptoms. While they can be found throughout the house, these microscopic creatures thrive in warm, humid environments such as bedding, upholstered furniture and carpeting. Because so much time is spent in the bedroom, it is essential to reduce mite levels there.  Encase pillows, mattresses, and box springs in special allergen-proof fabric covers or airtight, zippered plastic covers.  Bedding should be washed weekly in hot water (130 F) and dried in a hot dryer. Allergen-proof covers are available for comforters and pillows that cant be regularly washed.  Wash the allergy-proof covers every few months. Minimize clutter in the bedroom. Keep pets out of the bedroom.  Keep humidity less than 50% by using a dehumidifier or air conditioning. You can buy a humidity measuring device called a hygrometer to monitor this.  If possible, replace carpets with hardwood, linoleum, or washable area rugs. If that's not possible, vacuum frequently with a vacuum that has a HEPA filter. Remove all upholstered furniture and non-washable window drapes from the bedroom. Remove all non-washable stuffed toys  from the bedroom.  Wash stuffed toys weekly.  Return in about 6 months (around 09/20/2021), or if symptoms worsen or fail to improve.    Thank you for the opportunity to care for this patient.  Please do not hesitate to contact me with questions.  Althea Charon, FNP [facility]

## 2021-03-23 NOTE — Telephone Encounter (Signed)
Will come around 8:30 to try on the surgical shoe,no appointment needed to try it on.

## 2021-03-23 NOTE — Patient Instructions (Addendum)
Seasonal an perennial allergic rhinitis ( June 2022 skin test positive to grass, weed pollen, ragweed, trees, cat, dog, cockroach, and dust mites) Continue environmental control measures as below. Use over the counter antihistamines such as Zyrtec (cetirizine), Claritin (loratadine), Allegra (fexofenadine), or Xyzal (levocetirizine) daily as needed. May take twice a day during allergy flares. May switch antihistamines every few months. Use Flonase (fluticasone) nasal spray 1 spray per nostril twice a day as needed for nasal congestion.  Use azelastine nasal spray 1-2 sprays per nostril twice a day as needed for runny nose/drainage. Nasal saline spray (i.e., Simply Saline) or nasal saline lavage (i.e., NeilMed) is recommended as needed and prior to medicated nasal sprays. Consider allergy injections for long term control if above medications do not help the symptoms    Adverse food reaction ( negative skin test to milk and casein in June 2022) Lactose intolerance: May use lactose free milk or take a lactaid pill right before consuming anything with dairy. I don't think you are allergic to milk protein as you have been eating cheese with no issues.   Rash Continue to follow up with dermatology and follow their plan Recommend increasing moisturizing program to twice a day with Cerave, Cetaphil or Eucerin  Recommend starting Zyrtec (cetirizine) 10 mg once a day to see if this helps with the itching   Follow up in 6 months or sooner if needed.   Reducing Pollen Exposure Pollen seasons: trees (spring), grass (summer) and ragweed/weeds (fall). Keep windows closed in your home and car to lower pollen exposure.  Install air conditioning in the bedroom and throughout the house if possible.  Avoid going out in dry windy days - especially early morning. Pollen counts are highest between 5 - 10 AM and on dry, hot and windy days.  Save outside activities for late afternoon or after a heavy rain, when  pollen levels are lower.  Avoid mowing of grass if you have grass pollen allergy. Be aware that pollen can also be transported indoors on people and pets.  Dry your clothes in an automatic dryer rather than hanging them outside where they might collect pollen.  Rinse hair and eyes before bedtime.  Pet Allergen Avoidance: Contrary to popular opinion, there are no hypoallergenic breeds of dogs or cats. That is because people are not allergic to an animals hair, but to an allergen found in the animal's saliva, dander (dead skin flakes) or urine. Pet allergy symptoms typically occur within minutes. For some people, symptoms can build up and become most severe 8 to 12 hours after contact with the animal. People with severe allergies can experience reactions in public places if dander has been transported on the pet owners clothing. Keeping an animal outdoors is only a partial solution, since homes with pets in the yard still have higher concentrations of animal allergens. Before getting a pet, ask your allergist to determine if you are allergic to animals. If your pet is already considered part of your family, try to minimize contact and keep the pet out of the bedroom and other rooms where you spend a great deal of time. As with dust mites, vacuum carpets often or replace carpet with a hardwood floor, tile or linoleum. High-efficiency particulate air (HEPA) cleaners can reduce allergen levels over time. While dander and saliva are the source of cat and dog allergens, urine is the source of allergens from rabbits, hamsters, mice and [place] pigs; so ask a non-allergic family member to clean the animals cage.  If you have a pet allergy, talk to your allergist about the potential for allergy immunotherapy (allergy shots). This strategy can often provide long-term relief.  Cockroach Allergen Avoidance Cockroaches are often found in the homes of densely populated urban areas, schools or commercial  buildings, but these creatures can lurk almost anywhere. This does not mean that you have a dirty house or living area. Block all areas where roaches can enter the home. This includes crevices, wall cracks and windows.  Cockroaches need water to survive, so fix and seal all leaky faucets and pipes. Have an exterminator go through the house when your family and pets are gone to eliminate any remaining roaches. Keep food in lidded containers and put pet food dishes away after your pets are done eating. Vacuum and sweep the floor after meals, and take out garbage and recyclables. Use lidded garbage containers in the kitchen. Wash dishes immediately after use and clean under stoves, refrigerators or toasters where crumbs can accumulate. Wipe off the stove and other kitchen surfaces and cupboards regularly.  Control of House Dust Mite Allergen Dust mite allergens are a common trigger of allergy and asthma symptoms. While they can be found throughout the house, these microscopic creatures thrive in warm, humid environments such as bedding, upholstered furniture and carpeting. Because so much time is spent in the bedroom, it is essential to reduce mite levels there.  Encase pillows, mattresses, and box springs in special allergen-proof fabric covers or airtight, zippered plastic covers.  Bedding should be washed weekly in hot water (130 F) and dried in a hot dryer. Allergen-proof covers are available for comforters and pillows that cant be regularly washed.  Wash the allergy-proof covers every few months. Minimize clutter in the bedroom. Keep pets out of the bedroom.  Keep humidity less than 50% by using a dehumidifier or air conditioning. You can buy a humidity measuring device called a hygrometer to monitor this.  If possible, replace carpets with hardwood, linoleum, or washable area rugs. If that's not possible, vacuum frequently with a vacuum that has a HEPA filter. Remove all upholstered furniture and  non-washable window drapes from the bedroom. Remove all non-washable stuffed toys from the bedroom.  Wash stuffed toys weekly.

## 2021-03-25 ENCOUNTER — Telehealth: Payer: Self-pay

## 2021-03-25 NOTE — Telephone Encounter (Signed)
Spoke with mom today. She received a letter in the mail 1/23 stating [place] does not meet criteria for SNAP due to the income of the household. Kassie Mends is the case worker. Number is [phone]. Mom has left 3 messages and no call back. Case ID number is [id number]. [place] Coordinator LVM for [facility] asking for a call back re: additional information for the denial.  Mom to reach back out [employer] case worker for an update as well. [place] Coordinator to call mom this Friday with an update.

## 2021-03-27 NOTE — Telephone Encounter (Signed)
Spoke with mom and [place] today. No call received from [facility]. [place] Coordinator tried calling again today and reached VM. No contact info left for a supervisor. Mom to email [employer] [facility] [employer] Coordinator a copy of the letter she received.

## 2021-04-02 ENCOUNTER — Other Ambulatory Visit: Payer: Self-pay

## 2021-04-02 ENCOUNTER — Encounter (HOSPITAL_BASED_OUTPATIENT_CLINIC_OR_DEPARTMENT_OTHER): Payer: 59 | Attending: Internal Medicine | Admitting: Internal Medicine

## 2021-04-02 DIAGNOSIS — L732 Hidradenitis suppurativa: Secondary | ICD-10-CM | POA: Insufficient documentation

## 2021-04-02 DIAGNOSIS — E11628 Type 2 diabetes mellitus with other skin complications: Secondary | ICD-10-CM | POA: Insufficient documentation

## 2021-04-02 DIAGNOSIS — E1151 Type 2 diabetes mellitus with diabetic peripheral angiopathy without gangrene: Secondary | ICD-10-CM | POA: Diagnosis not present

## 2021-04-02 DIAGNOSIS — L98498 Non-pressure chronic ulcer of skin of other sites with other specified severity: Secondary | ICD-10-CM | POA: Diagnosis not present

## 2021-04-02 DIAGNOSIS — M1990 Unspecified osteoarthritis, unspecified site: Secondary | ICD-10-CM | POA: Insufficient documentation

## 2021-04-02 DIAGNOSIS — G40909 Epilepsy, unspecified, not intractable, without status epilepticus: Secondary | ICD-10-CM | POA: Insufficient documentation

## 2021-04-03 ENCOUNTER — Ambulatory Visit (INDEPENDENT_AMBULATORY_CARE_PROVIDER_SITE_OTHER): Payer: 59 | Admitting: Pharmacist

## 2021-04-06 NOTE — Progress Notes (Signed)
CHONTEL, WARNING ([id number]) Visit Report for 04/02/2021 Arrival Information Details Patient Name: Date of Service: Deanna Levine 04/02/2021 10:30 A M Medical Record Number: [id number] Patient Account Number: [id number] Date of Birth/Sex: Treating RN: 02/09/2001 (21 y.o. Deanna Levine Primary Care Gelena Klosinski: Pricilla Holm Other Clinician: Referring Raeleen Winstanley: Treating Kojo Liby/Extender: Charlie Pitter in Treatment: 3 Visit Information History Since Last Visit Added or deleted any medications: No Patient Arrived: Ambulatory Any new allergies or adverse reactions: No Arrival Time: 10:38 Had a fall or experienced change in No Accompanied By: alone activities of daily living that may affect Transfer Assistance: None risk of falls: Patient Identification Verified: Yes Signs or symptoms of abuse/neglect since last visito No Secondary Verification Process Completed: Yes Hospitalized since last visit: No Patient Requires Transmission-Based Precautions: No Implantable device outside of the clinic excluding No Patient Has Alerts: No cellular tissue based products placed in the center since last visit: Has Dressing in Place as Prescribed: Yes Pain Present Now: No Electronic Signature(s) Signed: 04/06/2021 2:05:18 PM By: Levan Hurst RN, BSN Entered By: Levan Hurst on 04/02/2021 10:38:41 -------------------------------------------------------------------------------- Clinic Level of Care Assessment Details Patient Name: Date of Service: Deanna Levine 04/02/2021 10:30 A M Medical Record Number: [id number] Patient Account Number: [id number] Date of Birth/Sex: Treating RN: [birth year] (21 y.o. Deanna Levine Primary Care Shakea Isip: Pricilla Holm Other Clinician: Referring Alba Perillo: Treating Estanislado Surgeon/Extender: Charlie Pitter in Treatment: 3 Clinic Level of Care Assessment Items TOOL 4 Quantity Score X- 1 0 Use  when only an EandM is performed on FOLLOW-UP visit ASSESSMENTS - Nursing Assessment / Reassessment X- 1 10 Reassessment of Co-morbidities (includes updates in patient status) X- 1 5 Reassessment of Adherence to Treatment Plan ASSESSMENTS - Wound and Skin A ssessment / Reassessment []  - 0 Simple Wound Assessment / Reassessment - one wound X- 2 5 Complex Wound Assessment / Reassessment - multiple wounds []  - 0 Dermatologic / Skin Assessment (not related to wound area) ASSESSMENTS - Focused Assessment []  - 0 Circumferential Edema Measurements - multi extremities []  - 0 Nutritional Assessment / Counseling / Intervention []  - 0 Lower Extremity Assessment (monofilament, tuning fork, pulses) []  - 0 Peripheral Arterial Disease Assessment (using hand held doppler) ASSESSMENTS - Ostomy and/or Continence Assessment and Care []  - 0 Incontinence Assessment and Management []  - 0 Ostomy Care Assessment and Management (repouching, etc.) PROCESS - Coordination of Care X - Simple Patient / Family Education for ongoing care 1 15 []  - 0 Complex (extensive) Patient / Family Education for ongoing care X- 1 10 Staff obtains Programmer, systems, Records, T Results / Process Orders est []  - 0 Staff telephones HHA, Nursing Homes / Clarify orders / etc []  - 0 Routine Transfer to another Facility (non-emergent condition) []  - 0 Routine Hospital Admission (non-emergent condition) []  - 0 New Admissions / Biomedical engineer / Ordering NPWT Apligraf, etc. , []  - 0 Emergency Hospital Admission (emergent condition) X- 1 10 Simple Discharge Coordination []  - 0 Complex (extensive) Discharge Coordination PROCESS - Special Needs []  - 0 Pediatric / Minor Patient Management []  - 0 Isolation Patient Management []  - 0 Hearing / Language / Visual special needs []  - 0 Assessment of Community assistance (transportation, D/C planning, etc.) []  - 0 Additional assistance / Altered mentation []  - 0 Support  Surface(s) Assessment (bed, cushion, seat, etc.) INTERVENTIONS - Wound Cleansing / Measurement []  - 0 Simple Wound Cleansing - one wound X- 2 5 Complex Wound Cleansing - multiple wounds X- 1 5  Wound Imaging (photographs - any number of wounds) []  - 0 Wound Tracing (instead of photographs) []  - 0 Simple Wound Measurement - one wound X- 2 5 Complex Wound Measurement - multiple wounds INTERVENTIONS - Wound Dressings []  - 0 Small Wound Dressing one or multiple wounds []  - 0 Medium Wound Dressing one or multiple wounds []  - 0 Large Wound Dressing one or multiple wounds []  - 0 Application of Medications - topical []  - 0 Application of Medications - injection INTERVENTIONS - Miscellaneous []  - 0 External ear exam []  - 0 Specimen Collection (cultures, biopsies, blood, body fluids, etc.) []  - 0 Specimen(s) / Culture(s) sent or taken to Lab for analysis []  - 0 Patient Transfer (multiple staff / Civil Service fast streamer / Similar devices) []  - 0 Simple Staple / Suture removal (25 or less) []  - 0 Complex Staple / Suture removal (26 or more) []  - 0 Hypo / Hyperglycemic Management (close monitor of Blood Glucose) []  - 0 Ankle / Brachial Index (ABI) - do not check if billed separately X- 1 5 Vital Signs Has the patient been seen at the hospital within the last three years: Yes Total Score: 90 Level Of Care: New/Established - Level 3 Electronic Signature(s) Signed: 04/06/2021 2:05:18 PM By: Levan Hurst RN, BSN Entered By: Levan Hurst on 04/02/2021 11:01:49 -------------------------------------------------------------------------------- Encounter Discharge Information Details Patient Name: Date of Service: Deanna Levine 04/02/2021 10:30 A M Medical Record Number: [id number] Patient Account Number: [id number] Date of Birth/Sex: Treating RN: 06/20/2000 (21 y.o. Deanna Levine Primary Care Akiyah Eppolito: Pricilla Holm Other Clinician: Referring Rufina Kimery: Treating Renna Kilmer/Extender:  Charlie Pitter in Treatment: 3 Encounter Discharge Information Items Discharge Condition: Stable Ambulatory Status: Ambulatory Discharge Destination: Home Transportation: Private Auto Accompanied By: alone Schedule Follow-up Appointment: Yes Clinical Summary of Care: Patient Declined Electronic Signature(s) Signed: 04/06/2021 2:05:18 PM By: Levan Hurst RN, BSN Entered By: Levan Hurst on 04/02/2021 11:02:35 -------------------------------------------------------------------------------- Multi Wound Chart Details Patient Name: Date of Service: JOVI, ALVIZO 04/02/2021 10:30 A M Medical Record Number: [id number] Patient Account Number: [id number] Date of Birth/Sex: Treating RN: 05/26/2000 (21 y.o. Deanna Levine Primary Care Cole Klugh: Pricilla Holm Other Clinician: Referring Solei Wubben: Treating Paxtyn Boyar/Extender: Charlie Pitter in Treatment: 3 Vital Signs Height(in): 64 Capillary Blood Glucose(mg/dl): 125 Weight(lbs): 214 Pulse(bpm): 26 Body Mass Index(BMI): 36.7 Blood Pressure(mmHg): 127/80 Temperature(F): 98.4 Respiratory Rate(breaths/min): 16 Photos: [5:Left Breast] [6:Right Breast] [N/A:N/A N/A] Wound Location: [5:Bump] [6:Bump] [N/A:N/A] Wounding Event: [5:Hidradenitis] [6:Hidradenitis] [N/A:N/A] Primary Etiology: [5:Chronic sinus problems/congestion,] [6:Chronic sinus problems/congestion,] [N/A:N/A] Comorbid History: [5:Type II Diabetes, Osteoarthritis, Seizure Disorder 03/11/2021] [6:Type II Diabetes, Osteoarthritis, Seizure Disorder 03/11/2021] [N/A:N/A] Date Acquired: [5:2] [6:2] [N/A:N/A] Weeks of Treatment: [5:Open] [6:Open] [N/A:N/A] Wound Status: [5:No] [6:No] [N/A:N/A] Wound Recurrence: [5:0x0x0] [6:0x0x0] [N/A:N/A] Measurements L x W x D (cm) [5:0] [6:0] [N/A:N/A] A (cm) : rea [5:0] [6:0] [N/A:N/A] Volume (cm) : [5:100.00%] [6:100.00%] [N/A:N/A] % Reduction in Area: [5:100.00%] [6:100.00%]  [N/A:N/A] % Reduction in Volume: [5:Full Thickness Without Exposed] [6:Full Thickness Without Exposed] [N/A:N/A] Classification: [5:Support Structures None Present] [6:Support Structures None Present] [N/A:N/A] Exudate Amount: [5:Flat and Intact] [6:Flat and Intact] [N/A:N/A] Wound Margin: [5:None Present (0%)] [6:None Present (0%)] [N/A:N/A] Granulation Amount: [5:None Present (0%)] [6:None Present (0%)] [N/A:N/A] Necrotic Amount: [5:Fascia: No] [6:Fascia: No] [N/A:N/A] Exposed Structures: [5:Fat Layer (Subcutaneous Tissue): No Tendon: No Muscle: No Joint: No Bone: No Large (67-100%)] [6:Fat Layer (Subcutaneous Tissue): No Tendon: No Muscle: No Joint: No Bone: No Large (67-100%)] [N/A:N/A] Treatment Notes Electronic Signature(s) Signed: 04/02/2021 4:50:25  PM By: Linton Ham MD Signed: 04/06/2021 2:05:18 PM By: Levan Hurst RN, BSN Entered By: Linton Ham on 04/02/2021 11:02:10 -------------------------------------------------------------------------------- [place] Details Patient Name: Date of Service: MAYBEL, DAMBROSIO 04/02/2021 10:30 A M Medical Record Number: [id number] Patient Account Number: [id number] Date of Birth/Sex: Treating RN: [birth year] (21 y.o. Deanna Levine Primary Care Terralyn Matsumura: Pricilla Holm Other Clinician: Referring Darl Brisbin: Treating Thi Sisemore/Extender: Charlie Pitter in Treatment: 3 Active Inactive Electronic Signature(s) Signed: 04/06/2021 2:05:18 PM By: Levan Hurst RN, BSN Entered By: Levan Hurst on 04/02/2021 10:59:50 -------------------------------------------------------------------------------- Pain Assessment Details Patient Name: Date of Service: JOETTA, DELPRADO 04/02/2021 10:30 A M Medical Record Number: [id number] Patient Account Number: [id number] Date of Birth/Sex: Treating RN: Nov 12, 2000 (21 y.o. Deanna Levine Primary Care Giulian Goldring: Pricilla Holm Other  Clinician: Referring Garnett Rekowski: Treating Delvon Chipps/Extender: Charlie Pitter in Treatment: 3 Active Problems Location of Pain Severity and Description of Pain Patient Has Paino No Site Locations Pain Management and Medication Current Pain Management: Electronic Signature(s) Signed: 04/06/2021 2:05:18 PM By: Levan Hurst RN, BSN Entered By: Levan Hurst on 04/02/2021 10:39:37 -------------------------------------------------------------------------------- Patient/Caregiver Education Details Patient Name: Date of Service: Clarene Essex 2/2/2023andnbsp10:30 A M Medical Record Number: [id number] Patient Account Number: [id number] Date of Birth/Gender: Treating RN: [birth year] (21 y.o. Deanna Levine Primary Care Physician: Pricilla Holm Other Clinician: Referring Physician: Treating Physician/Extender: Charlie Pitter in Treatment: 3 Education Assessment Education Provided To: Patient Education Topics Provided Wound/Skin Impairment: Methods: Explain/Verbal Responses: State content correctly Electronic Signature(s) Signed: 04/06/2021 2:05:18 PM By: Levan Hurst RN, BSN Entered By: Levan Hurst on 04/02/2021 11:01:08 -------------------------------------------------------------------------------- Wound Assessment Details Patient Name: Date of Service: BRACHA, FRANKOWSKI 04/02/2021 10:30 A M Medical Record Number: [id number] Patient Account Number: [id number] Date of Birth/Sex: Treating RN: 2000/05/16 (21 y.o. Deanna Levine Primary Care Jordain Radin: Pricilla Holm Other Clinician: Referring Madelynne Lasker: Treating Brandt Chaney/Extender: Charlie Pitter in Treatment: 3 Wound Status Wound Number: 5 Primary Hidradenitis Etiology: Wound Location: Left Breast Wound Open Wounding Event: Bump Status: Date Acquired: 03/11/2021 Comorbid Chronic sinus problems/congestion, Type II  Diabetes, Weeks Of Treatment: 2 History: Osteoarthritis, Seizure Disorder Clustered Wound: No Photos Wound Measurements Length: (cm) Width: (cm) Depth: (cm) Area: (cm) Volume: (cm) 0 % Reduction in Area: 100% 0 % Reduction in Volume: 100% 0 Epithelialization: Large (67-100%) 0 Tunneling: No 0 Undermining: No Wound Description Classification: Full Thickness Without Exposed Support Structures Wound Margin: Flat and Intact Exudate Amount: None Present Foul Odor After Cleansing: No Slough/Fibrino No Wound Bed Granulation Amount: None Present (0%) Exposed Structure Necrotic Amount: None Present (0%) Fascia Exposed: No Fat Layer (Subcutaneous Tissue) Exposed: No Tendon Exposed: No Muscle Exposed: No Joint Exposed: No Bone Exposed: No Electronic Signature(s) Signed: 04/02/2021 1:34:24 PM By: Sandre Kitty Signed: 04/06/2021 2:05:18 PM By: Levan Hurst RN, BSN Entered By: Sandre Kitty on 04/02/2021 10:44:58 -------------------------------------------------------------------------------- Wound Assessment Details Patient Name: Date of Service: AMRA, SHUKLA 04/02/2021 10:30 A M Medical Record Number: [id number] Patient Account Number: [id number] Date of Birth/Sex: Treating RN: 2000/07/31 (21 y.o. Deanna Levine Primary Care Shiori Adcox: Pricilla Holm Other Clinician: Referring Linea Calles: Treating Khup Sapia/Extender: Charlie Pitter in Treatment: 3 Wound Status Wound Number: 6 Primary Hidradenitis Etiology: Wound Location: Right Breast Wound Open Wounding Event: Bump Status: Date Acquired: 03/11/2021 Comorbid Chronic sinus problems/congestion, Type II Diabetes, Weeks Of Treatment: 2 History: Osteoarthritis, Seizure Disorder Clustered Wound: No Photos Wound Measurements Length: (cm) Width: (cm) Depth: (cm) Area: (cm) Volume: (cm)  0 % Reduction in Area: 100% 0 % Reduction in Volume: 100% 0 Epithelialization: Large  (67-100%) 0 Tunneling: No 0 Undermining: No Wound Description Classification: Full Thickness Without Exposed Support Structures Wound Margin: Flat and Intact Exudate Amount: None Present Foul Odor After Cleansing: No Slough/Fibrino No Wound Bed Granulation Amount: None Present (0%) Exposed Structure Necrotic Amount: None Present (0%) Fascia Exposed: No Fat Layer (Subcutaneous Tissue) Exposed: No Tendon Exposed: No Muscle Exposed: No Joint Exposed: No Bone Exposed: No Electronic Signature(s) Signed: 04/02/2021 1:34:24 PM By: Sandre Kitty Signed: 04/06/2021 2:05:18 PM By: Levan Hurst RN, BSN Entered By: Sandre Kitty on 04/02/2021 10:45:37 -------------------------------------------------------------------------------- Vitals Details Patient Name: Date of Service: STARLEE, CORRALEJO 04/02/2021 10:30 A M Medical Record Number: [id number] Patient Account Number: [id number] Date of Birth/Sex: Treating RN: November 30, 2000 (21 y.o. Deanna Levine Primary Care Saphia Vanderford: Pricilla Holm Other Clinician: Referring Eusebio Blazejewski: Treating Kerrington Greenhalgh/Extender: Charlie Pitter in Treatment: 3 Vital Signs Time Taken: 10:38 Temperature (F): 98.4 Height (in): 64 Pulse (bpm): 92 Weight (lbs): 214 Respiratory Rate (breaths/min): 16 Body Mass Index (BMI): 36.7 Blood Pressure (mmHg): 127/80 Capillary Blood Glucose (mg/dl): 125 Reference Range: 80 - 120 mg / dl Notes glucose per pt report Electronic Signature(s) Signed: 04/06/2021 2:05:18 PM By: Levan Hurst RN, BSN Entered By: Levan Hurst on 04/02/2021 10:39:11

## 2021-04-06 NOTE — Progress Notes (Signed)
HANAAN, GANCARZ ([id number]) Visit Report for 04/02/2021 HPI Details Patient Name: Date of Service: Deanna Levine, Deanna Levine 04/02/2021 10:30 A M Medical Record Number: [id number] Patient Account Number: [id number] Date of Birth/Sex: Treating RN: September 09, 2000 (21 y.o. Nancy Fetter Primary Care Provider: Pricilla Holm Other Clinician: Referring Provider: Treating Provider/Extender: Charlie Pitter in Treatment: 3 History of Present Illness HPI Description: 06/04/2020 patient presents today for initial evaluation here in clinic concerning issues she has been having with her under her arms, under the breast, in the suprapubic region, and on her upper back as well with issues which are consistent with hidradenitis though I think it is more on the milder side. Her and her mother have been on top of this trying to keep things under control. Her mother also has issues with hidradenitis though not severely either. This does seem to potentially be something that runs in the family. Nonetheless fortunately there does not appear to be any evidence of significant infection at this point. The wound which is actually under the right breast location actually does not appear to be infected right now which is great news. With that being said I do see evidence currently of scarring in multiple locations which show that the patient has obviously had issues in the past that several other locations as well. All in the same regions. Nonetheless she does not have a tremendous amount of pain noted at this point which is good news. She does have some mild mental disability according to her mother she does have mild dysphagia as well which requires that she has to either take liquid or crushed antibiotics when she does get those. Her most recent hemoglobin A1c was 6.6. Currently this episode has been going on for about 6 months ago again 3 years as the total length of time she has been dealing with  this. She has never seen a hidradenitis clinic. 06/18/20 upon evaluation today patient appears to be doing in my opinion a little better compared to her previous evaluation. There does not appear to be any signs of infection which is great news and overall very pleased with where things stand today. She has not heard from the hidradenitis clinic at [facility] yet although we made that referral last time she was here 2 weeks ago. 5//22 upon evaluation today patient appears to be doing well with regard to her wounds. I do think the Hydrofera Blue has been doing well for her. She has a new area open up on the left/medial chest just inside of her breast. With that being said this does not appear to be too significant at this point which is great news. With that being said she unfortunately is continuing to have new areas show up but again these are minimal and seem to be under fairly good control which is great as well. I do not see any signs of active infection at this point which is great news. She did get a call from [facility] from the hidradenitis clinic. With that being said she is on a wait list it sounds like at this point. 07/30/2020 upon evaluation today patient appears to be doing well with regard to her wounds for the most part. There are some areas that have actually healed quite nicely. With that being said she has some regions that do not seem to be healing nearly as effectively at this point. Overall I am pleased with what we are seeing for the most part but at the same time I  do feel like that the new areas that pop up or not good she has an area underneath her underarm on the left as well. She has been on doxycycline. She still on the wait list at [facility] she has heard nothing from that according to her mother. In regard to the dermatologist that they have through [facility] that she will be seeing in November they are on a wait list there in case anybody cancels but have not heard anything from that. 09/03/2020  upon evaluation today patient appears to be doing well with regard to her the hidradenitis areas underneath the breast location. She has been tolerating the dressing changes without complication with been using Hydrofera Blue which is doing a great job. She does not have any open areas under the underarms either at this point. Everything seems to be doing quite well which is great. 10/01/2020 upon evaluation today patient appears to be doing well with regard to her wounds. Again she has mainly been seen for issues with hidradenitis and to be honest everything appears to be pretty much filled out at this point. There does not appear to be any evidence of active infection which is great news and overall I am extremely pleased with where things stand today. No fevers, chills, nausea, vomiting, or diarrhea. Readmission 03/09/2021; patient presents because she states that a few weeks ago she had reopening of areas underneath her breast and right axilla related to her hidradenitis. She follows with dermatology for this issue. She states these areas have healed and she has no open wounds today. She states she uses Hibiclens and clindamycin solution to help maintain the area clean. She was supposed to be on Keflex suspension however because she had surgery this was held and when she went back to retake it it had expired. She has not followed up with her dermatologist for this. She has not heard back from the hidradenitis clinic at [facility] that we referred her to. 1/18; patient was readmitted to the clinic by Dr. Heber [place] last week at which time she had no open areas. She came back because some areas on the undersurface of both breasts have opened. She has fairly clear hidradenitis for which she has seen Dr. Leonie Green at [facility] dermatology. She has been using Hibiclens on the areas. She has an appointment with Dr. Leonie Green in March 2/2; patient has no open wounds on the undersurface of either breast. May be an  impending small open area in the left axilla but I did not see anything else worth looking at. I have left her with a prescription at her pharmacy for topical clindamycin to use before she gets into see Dr. Leonie Green at [facility] dermatology in March. I do not think this is severe enough for continue with systemic therapy at present Electronic Signature(s) Signed: 04/02/2021 4:50:25 PM By: Linton Ham MD Entered By: Linton Ham on 04/02/2021 11:03:28 -------------------------------------------------------------------------------- Physical Exam Details Patient Name: Date of Service: CERISE, LIEBER 04/02/2021 10:30 A M Medical Record Number: [id number] Patient Account Number: [id number] Date of Birth/Sex: Treating RN: [birth year] (21 y.o. Nancy Fetter Primary Care Provider: Pricilla Holm Other Clinician: Referring Provider: Treating Provider/Extender: Charlie Pitter in Treatment: 3 Constitutional Sitting or standing Blood Pressure is within target range for patient.. Pulse regular and within target range for patient.Marland Kitchen Respirations regular, non-labored and within target range.. Temperature is normal and within the target range for the patient.Marland Kitchen Appears in no distress. Notes Wound exam; all the wounds on the undersurface  of both breasts are closed. There is a threatening area in the left axilla but this is small and nonthreatening. Electronic Signature(s) Signed: 04/02/2021 4:50:25 PM By: Linton Ham MD Entered By: Linton Ham on 04/02/2021 11:04:12 -------------------------------------------------------------------------------- Physician Orders Details Patient Name: Date of Service: MIRAYAH, WREN 04/02/2021 10:30 A M Medical Record Number: [id number] Patient Account Number: [id number] Date of Birth/Sex: Treating RN: 07/26/2000 (21 y.o. Nancy Fetter Primary Care Provider: Pricilla Holm Other Clinician: Referring  Provider: Treating Provider/Extender: Charlie Pitter in Treatment: 3 Verbal / Phone Orders: No Diagnosis Coding ICD-10 Coding Code Description L73.2 Hidradenitis suppurativa E11.628 Type 2 diabetes mellitus with other skin complications I09.735 Non-pressure chronic ulcer of skin of other sites with other specified severity Discharge From [facility] Discharge from [place] to use Clindamycin ointment as needed on flare ups, continue to follow Dermatology. Electronic Signature(s) Signed: 04/02/2021 4:50:25 PM By: Linton Ham MD Signed: 04/06/2021 2:05:18 PM By: Levan Hurst RN, BSN Entered By: Levan Hurst on 04/02/2021 10:57:35 -------------------------------------------------------------------------------- Problem List Details Patient Name: Date of Service: BRYTTANI, BLEW 04/02/2021 10:30 A M Medical Record Number: [id number] Patient Account Number: [id number] Date of Birth/Sex: Treating RN: Nov 01, 2000 (21 y.o. Nancy Fetter Primary Care Provider: Pricilla Holm Other Clinician: Referring Provider: Treating Provider/Extender: Charlie Pitter in Treatment: 3 Active Problems ICD-10 Encounter Code Description Active Date MDM Diagnosis L73.2 Hidradenitis suppurativa 03/09/2021 No Yes E11.628 Type 2 diabetes mellitus with other skin complications 05/02/1960 No Yes L98.498 Non-pressure chronic ulcer of skin of other sites with other specified severity 03/18/2021 No Yes Inactive Problems Resolved Problems Electronic Signature(s) Signed: 04/02/2021 4:50:25 PM By: Linton Ham MD Entered By: Linton Ham on 04/02/2021 11:02:00 -------------------------------------------------------------------------------- Progress Note Details Patient Name: Date of Service: CHERE, BABSON 04/02/2021 10:30 A M Medical Record Number: [id number] Patient Account Number: [id number] Date of Birth/Sex: Treating  RN: 2000-07-13 (21 y.o. Nancy Fetter Primary Care Provider: Pricilla Holm Other Clinician: Referring Provider: Treating Provider/Extender: Charlie Pitter in Treatment: 3 Subjective History of Present Illness (HPI) 06/04/2020 patient presents today for initial evaluation here in clinic concerning issues she has been having with her under her arms, under the breast, in the suprapubic region, and on her upper back as well with issues which are consistent with hidradenitis though I think it is more on the milder side. Her and her mother have been on top of this trying to keep things under control. Her mother also has issues with hidradenitis though not severely either. This does seem to potentially be something that runs in the family. Nonetheless fortunately there does not appear to be any evidence of significant infection at this point. The wound which is actually under the right breast location actually does not appear to be infected right now which is great news. With that being said I do see evidence currently of scarring in multiple locations which show that the patient has obviously had issues in the past that several other locations as well. All in the same regions. Nonetheless she does not have a tremendous amount of pain noted at this point which is good news. She does have some mild mental disability according to her mother she does have mild dysphagia as well which requires that she has to either take liquid or crushed antibiotics when she does get those. Her most recent hemoglobin A1c was 6.6. Currently this episode has been going on for about 6 months ago again 3 years  as the total length of time she has been dealing with this. She has never seen a hidradenitis clinic. 06/18/20 upon evaluation today patient appears to be doing in my opinion a little better compared to her previous evaluation. There does not appear to be any signs of infection which is  great news and overall very pleased with where things stand today. She has not heard from the hidradenitis clinic at [facility] yet although we made that referral last time she was here 2 weeks ago. 5//22 upon evaluation today patient appears to be doing well with regard to her wounds. I do think the Hydrofera Blue has been doing well for her. She has a new area open up on the left/medial chest just inside of her breast. With that being said this does not appear to be too significant at this point which is great news. With that being said she unfortunately is continuing to have new areas show up but again these are minimal and seem to be under fairly good control which is great as well. I do not see any signs of active infection at this point which is great news. She did get a call from [facility] from the hidradenitis clinic. With that being said she is on a wait list it sounds like at this point. 07/30/2020 upon evaluation today patient appears to be doing well with regard to her wounds for the most part. There are some areas that have actually healed quite nicely. With that being said she has some regions that do not seem to be healing nearly as effectively at this point. Overall I am pleased with what we are seeing for the most part but at the same time I do feel like that the new areas that pop up or not good she has an area underneath her underarm on the left as well. She has been on doxycycline. She still on the wait list at [facility] she has heard nothing from that according to her mother. In regard to the dermatologist that they have through [facility] that she will be seeing in November they are on a wait list there in case anybody cancels but have not heard anything from that. 09/03/2020 upon evaluation today patient appears to be doing well with regard to her the hidradenitis areas underneath the breast location. She has been tolerating the dressing changes without complication with been using Hydrofera Blue which is  doing a great job. She does not have any open areas under the underarms either at this point. Everything seems to be doing quite well which is great. 10/01/2020 upon evaluation today patient appears to be doing well with regard to her wounds. Again she has mainly been seen for issues with hidradenitis and to be honest everything appears to be pretty much filled out at this point. There does not appear to be any evidence of active infection which is great news and overall I am extremely pleased with where things stand today. No fevers, chills, nausea, vomiting, or diarrhea. Readmission 03/09/2021; patient presents because she states that a few weeks ago she had reopening of areas underneath her breast and right axilla related to her hidradenitis. She follows with dermatology for this issue. She states these areas have healed and she has no open wounds today. She states she uses Hibiclens and clindamycin solution to help maintain the area clean. She was supposed to be on Keflex suspension however because she had surgery this was held and when she went back to retake it  it had expired. She has not followed up with her dermatologist for this. She has not heard back from the hidradenitis clinic at [facility] that we referred her to. 1/18; patient was readmitted to the clinic by Dr. Heber [facility] last week at which time she had no open areas. She came back because some areas on the undersurface of both breasts have opened. She has fairly clear hidradenitis for which she has seen Dr. Leonie Green at [facility] dermatology. She has been using Hibiclens on the areas. She has an appointment with Dr. Leonie Green in March 2/2; patient has no open wounds on the undersurface of either breast. May be an impending small open area in the left axilla but I did not see anything else worth looking at. I have left her with a prescription at her pharmacy for topical clindamycin to use before she gets into see Dr. Leonie Green at [facility] dermatology  in March. I do not think this is severe enough for continue with systemic therapy at present Objective Constitutional Sitting or standing Blood Pressure is within target range for patient.. Pulse regular and within target range for patient.Marland Kitchen Respirations regular, non-labored and within target range.. Temperature is normal and within the target range for the patient.Marland Kitchen Appears in no distress. Vitals Time Taken: 10:38 AM, Height: 64 in, Weight: 214 lbs, BMI: 36.7, Temperature: 98.4 F, Pulse: 92 bpm, Respiratory Rate: 16 breaths/min, Blood Pressure: 127/80 mmHg, Capillary Blood Glucose: 125 mg/dl. General Notes: glucose per pt report General Notes: Wound exam; all the wounds on the undersurface of both breasts are closed. There is a threatening area in the left axilla but this is small and nonthreatening. Integumentary (Hair, Skin) Wound #5 status is Open. Original cause of wound was Bump. The date acquired was: 03/11/2021. The wound has been in treatment 2 weeks. The wound is located on the Left Breast. The wound measures 0cm length x 0cm width x 0cm depth; 0cm^2 area and 0cm^3 volume. There is no tunneling or undermining noted. There is a none present amount of drainage noted. The wound margin is flat and intact. There is no granulation within the wound bed. There is no necrotic tissue within the wound bed. Wound #6 status is Open. Original cause of wound was Bump. The date acquired was: 03/11/2021. The wound has been in treatment 2 weeks. The wound is located on the Right Breast. The wound measures 0cm length x 0cm width x 0cm depth; 0cm^2 area and 0cm^3 volume. There is no tunneling or undermining noted. There is a none present amount of drainage noted. The wound margin is flat and intact. There is no granulation within the wound bed. There is no necrotic tissue within the wound bed. Assessment Active Problems ICD-10 Hidradenitis suppurativa Type 2 diabetes mellitus with other skin  complications Non-pressure chronic ulcer of skin of other sites with other specified severity Plan Discharge From [facility]: Discharge from [place] to use Clindamycin ointment as needed on flare ups, continue to follow Dermatology. 1. Patient has hidradenitis suppurativa 2. Everything is closed including the bottom of both breasts although I think she is going to have episodic opening in these areas perhaps the left axilla as well. 3. I have left her with a prescription for topical clindamycin to use before she can get in with dermatology Dr. Leonie Green at [facility]) Signed: 04/02/2021 4:50:25 PM By: Linton Ham MD Entered By: Linton Ham on 04/02/2021 11:05:29 -------------------------------------------------------------------------------- SuperBill Details Patient Name: Date of Service: Clarene Essex 04/02/2021  Medical Record Number: [id number] Patient Account Number: [id number] Date of Birth/Sex: Treating RN: [birth year] (21 y.o. Nancy Fetter Primary Care Provider: Pricilla Holm Other Clinician: Referring Provider: Treating Provider/Extender: Charlie Pitter in Treatment: 3 Diagnosis Coding ICD-10 Codes Code Description L73.2 Hidradenitis suppurativa E11.628 Type 2 diabetes mellitus with other skin complications D89.784 Non-pressure chronic ulcer of skin of other sites with other specified severity Facility Procedures CPT4 Code: [id number] Description: 99213 - WOUND CARE VISIT-LEV 3 EST PT Modifier: Quantity: 1 Physician Procedures : CPT4 Code Description Modifier [id number] - WC PHYS LEVEL 2 - EST PT ICD-10 Diagnosis Description L73.2 Hidradenitis suppurativa L98.498 Non-pressure chronic ulcer of skin of other sites with other specified severity E11.628 Type 2 diabetes mellitus  with other skin complications Quantity: 1 Electronic Signature(s) Signed: 04/02/2021 4:50:25 PM By: Linton Ham MD Entered By: Linton Ham on 04/02/2021 11:05:58

## 2021-04-07 NOTE — Progress Notes (Signed)
Deanna Levine, Deanna Levine ([id number]) Visit Report for 03/18/2021 Arrival Information Details Patient Name: Date of Service: Deanna Levine, Deanna Levine 03/18/2021 2:00 PM Medical Record Number: [id number] Patient Account Number: [id number] Date of Birth/Sex: Treating RN: 06-10-2000 (21 y.o. Donalda Ewings Primary Care Elayne Gruver: Pricilla Holm Other Clinician: Referring Olaf Mesa: Treating Kirstine Jacquin/Extender: Charlie Pitter in Treatment: 1 Visit Information History Since Last Visit Added or deleted any medications: No Patient Arrived: Ambulatory Any new allergies or adverse reactions: No Arrival Time: 14:11 Had a fall or experienced change in No Accompanied By: mother activities of daily living that may affect Transfer Assistance: None risk of falls: Patient Identification Verified: Yes Signs or symptoms of abuse/neglect since last visito No Secondary Verification Process Completed: Yes Hospitalized since last visit: No Patient Requires Transmission-Based Precautions: No Implantable device outside of the clinic excluding No Patient Has Alerts: No cellular tissue based products placed in the center since last visit: Has Dressing in Place as Prescribed: Yes Pain Present Now: No Electronic Signature(s) Signed: 04/07/2021 8:13:15 AM By: Sharyn Creamer RN, BSN Entered By: Sharyn Creamer on 03/18/2021 14:13:57 -------------------------------------------------------------------------------- Clinic Level of Care Assessment Details Patient Name: Date of Service: RACHNA, SCHONBERGER 03/18/2021 2:00 PM Medical Record Number: [id number] Patient Account Number: [id number] Date of Birth/Sex: Treating RN: 08/15/2000 (21 y.o. Elam Dutch Primary Care Granvil Djordjevic: Pricilla Holm Other Clinician: Referring Thetis Schwimmer: Treating Trong Gosling/Extender: Charlie Pitter in Treatment: 1 Clinic Level of Care Assessment Items TOOL 4 Quantity Score []  - 0 Use  when only an EandM is performed on FOLLOW-UP visit ASSESSMENTS - Nursing Assessment / Reassessment X- 1 10 Reassessment of Co-morbidities (includes updates in patient status) X- 1 5 Reassessment of Adherence to Treatment Plan ASSESSMENTS - Wound and Skin A ssessment / Reassessment []  - 0 Simple Wound Assessment / Reassessment - one wound X- 2 5 Complex Wound Assessment / Reassessment - multiple wounds X- 1 10 Dermatologic / Skin Assessment (not related to wound area) ASSESSMENTS - Focused Assessment []  - 0 Circumferential Edema Measurements - multi extremities []  - 0 Nutritional Assessment / Counseling / Intervention []  - 0 Lower Extremity Assessment (monofilament, tuning fork, pulses) []  - 0 Peripheral Arterial Disease Assessment (using hand held doppler) ASSESSMENTS - Ostomy and/or Continence Assessment and Care []  - 0 Incontinence Assessment and Management []  - 0 Ostomy Care Assessment and Management (repouching, etc.) PROCESS - Coordination of Care X - Simple Patient / Family Education for ongoing care 1 15 []  - 0 Complex (extensive) Patient / Family Education for ongoing care X- 1 10 Staff obtains Programmer, systems, Records, T Results / Process Orders est []  - 0 Staff telephones HHA, Nursing Homes / Clarify orders / etc []  - 0 Routine Transfer to another Facility (non-emergent condition) []  - 0 Routine Hospital Admission (non-emergent condition) []  - 0 New Admissions / Biomedical engineer / Ordering NPWT Apligraf, etc. , []  - 0 Emergency Hospital Admission (emergent condition) X- 1 10 Simple Discharge Coordination []  - 0 Complex (extensive) Discharge Coordination PROCESS - Special Needs []  - 0 Pediatric / Minor Patient Management []  - 0 Isolation Patient Management []  - 0 Hearing / Language / Visual special needs []  - 0 Assessment of Community assistance (transportation, D/C planning, etc.) []  - 0 Additional assistance / Altered mentation []  - 0 Support  Surface(s) Assessment (bed, cushion, seat, etc.) INTERVENTIONS - Wound Cleansing / Measurement []  - 0 Simple Wound Cleansing - one wound X- 2 5 Complex Wound Cleansing - multiple wounds X- 1 5 Wound Imaging (  photographs - any number of wounds) []  - 0 Wound Tracing (instead of photographs) []  - 0 Simple Wound Measurement - one wound X- 2 5 Complex Wound Measurement - multiple wounds INTERVENTIONS - Wound Dressings X - Small Wound Dressing one or multiple wounds 2 10 []  - 0 Medium Wound Dressing one or multiple wounds []  - 0 Large Wound Dressing one or multiple wounds []  - 0 Application of Medications - topical []  - 0 Application of Medications - injection INTERVENTIONS - Miscellaneous []  - 0 External ear exam []  - 0 Specimen Collection (cultures, biopsies, blood, body fluids, etc.) []  - 0 Specimen(s) / Culture(s) sent or taken to Lab for analysis []  - 0 Patient Transfer (multiple staff / Civil Service fast streamer / Similar devices) []  - 0 Simple Staple / Suture removal (25 or less) []  - 0 Complex Staple / Suture removal (26 or more) []  - 0 Hypo / Hyperglycemic Management (close monitor of Blood Glucose) []  - 0 Ankle / Brachial Index (ABI) - do not check if billed separately X- 1 5 Vital Signs Has the patient been seen at the hospital within the last three years: Yes Total Score: 120 Level Of Care: New/Established - Level 4 Electronic Signature(s) Signed: 03/18/2021 4:46:25 PM By: Baruch Gouty RN, BSN Entered By: Baruch Gouty on 03/18/2021 15:16:48 -------------------------------------------------------------------------------- Encounter Discharge Information Details Patient Name: Date of Service: Deanna Levine, Deanna Levine 03/18/2021 2:00 PM Medical Record Number: [id number] Patient Account Number: [id number] Date of Birth/Sex: Treating RN: November 12, 2000 (21 y.o. Elam Dutch Primary Care Hendrixx Severin: Pricilla Holm Other Clinician: Referring Clydie Dillen: Treating  Ladeidra Borys/Extender: Charlie Pitter in Treatment: 1 Encounter Discharge Information Items Discharge Condition: Stable Ambulatory Status: Ambulatory Discharge Destination: Home Transportation: Private Auto Accompanied By: mother Schedule Follow-up Appointment: Yes Clinical Summary of Care: Patient Declined Electronic Signature(s) Signed: 03/18/2021 4:46:25 PM By: Baruch Gouty RN, BSN Entered By: Baruch Gouty on 03/18/2021 15:21:48 -------------------------------------------------------------------------------- Lower Extremity Assessment Details Patient Name: Date of Service: Deanna Levine, Deanna Levine 03/18/2021 2:00 PM Medical Record Number: [id number] Patient Account Number: [id number] Date of Birth/Sex: Treating RN: 04/18/2000 (21 y.o. Donalda Ewings Primary Care Mizuki Hoel: Pricilla Holm Other Clinician: Referring Zeynep Fantroy: Treating Iyauna Sing/Extender: Charlie Pitter in Treatment: 1 Electronic Signature(s) Signed: 04/07/2021 8:13:15 AM By: Sharyn Creamer RN, BSN Entered By: Sharyn Creamer on 03/18/2021 14:16:57 -------------------------------------------------------------------------------- Multi Wound Chart Details Patient Name: Date of Service: Deanna Levine, Deanna Levine 03/18/2021 2:00 PM Medical Record Number: [id number] Patient Account Number: [id number] Date of Birth/Sex: Treating RN: 02-28-2001 (21 y.o. Tonita Phoenix, Lauren Primary Care Jaceyon Strole: Pricilla Holm Other Clinician: Referring Clif Serio: Treating Bralen Wiltgen/Extender: Charlie Pitter in Treatment: 1 Vital Signs Height(in): 64 Capillary Blood Glucose(mg/dl): 205 Weight(lbs): 214 Pulse(bpm): 71 Body Mass Index(BMI): 37 Blood Pressure(mmHg): 107/73 Temperature(F): 98.6 Respiratory Rate(breaths/min): 18 Photos: [N/A:N/A] Left Breast Right Breast N/A Wound Location: Bump Bump N/A Wounding Event: Hidradenitis Hidradenitis  N/A Primary Etiology: Chronic sinus problems/congestion, Chronic sinus problems/congestion, N/A Comorbid History: Type II Diabetes, Osteoarthritis, Type II Diabetes, Osteoarthritis, Seizure Disorder Seizure Disorder 03/11/2021 03/11/2021 N/A Date Acquired: 0 0 N/A Weeks of Treatment: Open Open N/A Wound Status: 0.2x0.5x0.1 1x0.9x0.1 N/A Measurements L x W x D (cm) 0.079 0.707 N/A A (cm) : rea 0.008 0.071 N/A Volume (cm) : 0.00% 0.00% N/A % Reduction in Area: 0.00% 0.00% N/A % Reduction in Volume: Full Thickness Without Exposed Full Thickness Without Exposed N/A Classification: Support Structures Support Structures Small Small N/A Exudate Amount: Serosanguineous Sanguinous N/A Exudate Type: red, brown red N/A Exudate  Color: Flat and Intact Flat and Intact N/A Wound Margin: Small (1-33%) Large (67-100%) N/A Granulation Amount: Red Red N/A Granulation Quality: None Present (0%) None Present (0%) N/A Necrotic Amount: Fat Layer (Subcutaneous Tissue): Yes Fat Layer (Subcutaneous Tissue): Yes N/A Exposed Structures: Fascia: No Fascia: No Tendon: No Tendon: No Muscle: No Muscle: No Joint: No Joint: No Bone: No Bone: No Small (1-33%) None N/A Epithelialization: Treatment Notes Electronic Signature(s) Signed: 03/18/2021 3:43:46 PM By: Linton Ham MD Signed: 03/18/2021 4:57:53 PM By: Rhae Hammock RN Entered By: Linton Ham on 03/18/2021 15:08:23 -------------------------------------------------------------------------------- Pain Assessment Details Patient Name: Date of Service: Deanna Levine, Deanna Levine 03/18/2021 2:00 PM Medical Record Number: [id number] Patient Account Number: [id number] Date of Birth/Sex: Treating RN: June 12, 2000 (21 y.o. Donalda Ewings Primary Care Malayla Granberry: Pricilla Holm Other Clinician: Referring Skyeler Scalese: Treating Dariella Gillihan/Extender: Charlie Pitter in Treatment: 1 Active Problems Location of  Pain Severity and Description of Pain Patient Has Paino No Site Locations Pain Management and Medication Current Pain Management: Electronic Signature(s) Signed: 04/07/2021 8:13:15 AM By: Sharyn Creamer RN, BSN Entered By: Sharyn Creamer on 03/18/2021 14:16:46 -------------------------------------------------------------------------------- Patient/Caregiver Education Details Patient Name: Date of Service: Deanna Levine, Deanna Levine 1/18/2023andnbsp2:00 PM Medical Record Number: [id number] Patient Account Number: [id number] Date of Birth/Gender: Treating RN: December 20, 2000 (21 y.o. Elam Dutch Primary Care Physician: Pricilla Holm Other Clinician: Referring Physician: Treating Physician/Extender: Charlie Pitter in Treatment: 1 Education Assessment Education Provided To: Patient Education Topics Provided Wound/Skin Impairment: Methods: Explain/Verbal Responses: Reinforcements needed, State content correctly Electronic Signature(s) Signed: 03/18/2021 4:46:25 PM By: Baruch Gouty RN, BSN Entered By: Baruch Gouty on 03/18/2021 15:00:20 -------------------------------------------------------------------------------- Wound Assessment Details Patient Name: Date of Service: Deanna Levine, Deanna Levine 03/18/2021 2:00 PM Medical Record Number: [id number] Patient Account Number: [id number] Date of Birth/Sex: Treating RN: 2000/12/05 (21 y.o. Donalda Ewings Primary Care Jamarii Banks: Pricilla Holm Other Clinician: Referring Yula Crotwell: Treating Sidney Kann/Extender: Charlie Pitter in Treatment: 1 Wound Status Wound Number: 5 Primary Hidradenitis Etiology: Wound Location: Left Breast Wound Open Wounding Event: Bump Status: Date Acquired: 03/11/2021 Comorbid Chronic sinus problems/congestion, Type II Diabetes, Weeks Of Treatment: 0 History: Osteoarthritis, Seizure Disorder Clustered Wound: No Photos Wound Measurements Length: (cm)  0.2 Width: (cm) 0.5 Depth: (cm) 0.1 Area: (cm) 0.079 Volume: (cm) 0.008 % Reduction in Area: 0% % Reduction in Volume: 0% Epithelialization: Small (1-33%) Tunneling: No Undermining: No Wound Description Classification: Full Thickness Without Exposed Support Structures Wound Margin: Flat and Intact Exudate Amount: Small Exudate Type: Serosanguineous Exudate Color: red, brown Foul Odor After Cleansing: No Slough/Fibrino No Wound Bed Granulation Amount: Small (1-33%) Exposed Structure Granulation Quality: Red Fascia Exposed: No Necrotic Amount: None Present (0%) Fat Layer (Subcutaneous Tissue) Exposed: Yes Tendon Exposed: No Muscle Exposed: No Joint Exposed: No Bone Exposed: No Electronic Signature(s) Signed: 03/18/2021 4:46:25 PM By: Baruch Gouty RN, BSN Signed: 04/07/2021 8:13:15 AM By: Sharyn Creamer RN, BSN Entered By: Baruch Gouty on 03/18/2021 14:26:19 -------------------------------------------------------------------------------- Wound Assessment Details Patient Name: Date of Service: Deanna Levine, Deanna Levine 03/18/2021 2:00 PM Medical Record Number: [id number] Patient Account Number: [id number] Date of Birth/Sex: Treating RN: [birth year] (21 y.o. Donalda Ewings Primary Care Deral Schellenberg: Pricilla Holm Other Clinician: Referring Edan Serratore: Treating Abbi Mancini/Extender: Charlie Pitter in Treatment: 1 Wound Status Wound Number: 6 Primary Hidradenitis Etiology: Wound Location: Right Breast Wound Open Wounding Event: Bump Status: Date Acquired: 03/11/2021 Comorbid Chronic sinus problems/congestion, Type II Diabetes, Weeks Of Treatment: 0 History: Osteoarthritis, Seizure Disorder Clustered Wound: No Photos Wound Measurements Length: (cm)  1 Width: (cm) 0.9 Depth: (cm) 0.1 Area: (cm) 0.707 Volume: (cm) 0.071 % Reduction in Area: 0% % Reduction in Volume: 0% Epithelialization: None Tunneling: No Undermining: No Wound  Description Classification: Full Thickness Without Exposed Support Structures Wound Margin: Flat and Intact Exudate Amount: Small Exudate Type: Sanguinous Exudate Color: red Foul Odor After Cleansing: No Slough/Fibrino No Wound Bed Granulation Amount: Large (67-100%) Exposed Structure Granulation Quality: Red Fascia Exposed: No Necrotic Amount: None Present (0%) Fat Layer (Subcutaneous Tissue) Exposed: Yes Tendon Exposed: No Muscle Exposed: No Joint Exposed: No Bone Exposed: No Electronic Signature(s) Signed: 03/18/2021 4:46:25 PM By: Baruch Gouty RN, BSN Signed: 04/07/2021 8:13:15 AM By: Sharyn Creamer RN, BSN Entered By: Baruch Gouty on 03/18/2021 14:28:30 -------------------------------------------------------------------------------- Ebony Details Patient Name: Date of Service: Deanna Levine, Deanna Levine 03/18/2021 2:00 PM Medical Record Number: [id number] Patient Account Number: [id number] Date of Birth/Sex: Treating RN: 2000-03-13 (21 y.o. Donalda Ewings Primary Care Geneva Barrero: Pricilla Holm Other Clinician: Referring Raina Sole: Treating Lota Leamer/Extender: Charlie Pitter in Treatment: 1 Vital Signs Time Taken: 14:40 Temperature (F): 98.6 Height (in): 64 Pulse (bpm): 88 Source: Stated Respiratory Rate (breaths/min): 18 Weight (lbs): 214 Blood Pressure (mmHg): 107/73 Source: Stated Capillary Blood Glucose (mg/dl): 205 Body Mass Index (BMI): 36.7 Reference Range: 80 - 120 mg / dl Notes cbg per dexcom at present Electronic Signature(s) Signed: 04/07/2021 8:13:15 AM By: Sharyn Creamer RN, BSN Entered By: Sharyn Creamer on 03/18/2021 14:16:25

## 2021-04-10 ENCOUNTER — Ambulatory Visit: Payer: 59

## 2021-04-10 DIAGNOSIS — Z09 Encounter for follow-up examination after completed treatment for conditions other than malignant neoplasm: Secondary | ICD-10-CM

## 2021-04-10 NOTE — Progress Notes (Signed)
CASE MANAGEMENT VISIT  Session Start time: 10am  Session End time: 10:10am Total time:  10  minutes  Type of Service:CASE MANAGEMENT Interpretor:No. Interpretor Name and Language: N/A   Summary of Today's Visit: Phone call with mom and Aicha today to follow up on SNAP, medicaid and SSI. Mom did complete the appeal form for SNAP. [name] waiting for a call back. She did check in on the medicaid app and was told they are working on it. No other needs at this time.  Plan for Next Visit: Phone check in 2 weeks. Follow up scheduled with CJones FNP next week to discuss ongoing bleeding.   [place] Coordinator

## 2021-04-14 ENCOUNTER — Other Ambulatory Visit: Payer: Self-pay

## 2021-04-14 ENCOUNTER — Ambulatory Visit (INDEPENDENT_AMBULATORY_CARE_PROVIDER_SITE_OTHER): Payer: 59 | Admitting: Podiatry

## 2021-04-14 DIAGNOSIS — Q6689 Other  specified congenital deformities of feet: Secondary | ICD-10-CM

## 2021-04-14 DIAGNOSIS — M216X9 Other acquired deformities of unspecified foot: Secondary | ICD-10-CM

## 2021-04-14 DIAGNOSIS — E119 Type 2 diabetes mellitus without complications: Secondary | ICD-10-CM

## 2021-04-14 DIAGNOSIS — M7750 Other enthesopathy of unspecified foot: Secondary | ICD-10-CM

## 2021-04-14 NOTE — Progress Notes (Signed)
°  Subjective:  Patient ID: Deanna Levine, female    DOB: Mar 02, 2000,  MRN: [id number]  Chief Complaint  Patient presents with   coalition      COALITION RESECTION OF LT FOOT [address] [place]     21 y.o. female returns for post-op check.  Still about the same as last time as far as pain and function.  If she is on her feet for a long period of time it starts to hurt.  The boot helps and she tries to go as long as she can without it before it starts hurting.  The right foot is painful again as well she tried wearing a boot on the side and this helped some to but made the left foot hurt more  Review of Systems: Negative except as noted in the HPI. Denies N/V/F/Ch.   Objective:  There were no vitals filed for this visit. There is no height or weight on file to calculate BMI. Constitutional Well developed. Well nourished.  Vascular Foot warm and well perfused. Capillary refill normal to all digits.   Neurologic Normal speech. Oriented to person, place, and time. Epicritic sensation to light touch grossly present bilaterally.  Dermatologic Incision is well-healed minimally hypertrophic no keloid formation.  She has pain and tenderness of the subtalar joint laterally and with range of motion bilateral.  She has pain with resisted dorsiflexion along the TA tendon, difficult to completely evaluate as she is very resistant to motion and manipulation and tenses up each time I move her foot  Orthopedic: Tenderness to palpation noted about the surgical site.   Multiple view plain film radiographs: No significant changes since last visit, subtalar joint still visible Assessment:   1. Coalition, calcaneal tarsal   2. Tendinitis of foot     Plan:  Patient was evaluated and treated and all questions answered.  S/p foot surgery left -Still having quite a bit of pain.  I do think she needs a long-term brace for support.  She says she does not find the dorsiflexion dropfoot brace helpful and it cuts across  the front of the ankle and causes more pain.  Says it does not help with the foot itself either.  I think to stabilize the subtalar joint a [place] me[place] brace may be more beneficial than this.  I would like to see her with this along with her orthotist and I will have her scheduled for this.  I think ultimately she will end up needing a subtalar fusion at some point but hopefully can avoid this will put off until her other medical issues have been addressed  No follow-ups on file.

## 2021-04-16 ENCOUNTER — Ambulatory Visit: Payer: 59 | Admitting: Family

## 2021-04-23 ENCOUNTER — Ambulatory Visit (INDEPENDENT_AMBULATORY_CARE_PROVIDER_SITE_OTHER): Payer: 59 | Admitting: Internal Medicine

## 2021-04-23 ENCOUNTER — Other Ambulatory Visit: Payer: Self-pay

## 2021-04-23 VITALS — BP 126/80 | HR 88 | Temp 97.9°F | Resp 18 | Ht 64.0 in | Wt 216.0 lb

## 2021-04-23 DIAGNOSIS — J019 Acute sinusitis, unspecified: Secondary | ICD-10-CM

## 2021-04-23 DIAGNOSIS — M2420 Disorder of ligament, unspecified site: Secondary | ICD-10-CM | POA: Diagnosis not present

## 2021-04-23 DIAGNOSIS — R700 Elevated erythrocyte sedimentation rate: Secondary | ICD-10-CM

## 2021-04-23 DIAGNOSIS — R197 Diarrhea, unspecified: Secondary | ICD-10-CM

## 2021-04-23 DIAGNOSIS — R300 Dysuria: Secondary | ICD-10-CM

## 2021-04-23 DIAGNOSIS — M25571 Pain in right ankle and joints of right foot: Secondary | ICD-10-CM

## 2021-04-23 DIAGNOSIS — Q8789 Other specified congenital malformation syndromes, not elsewhere classified: Secondary | ICD-10-CM | POA: Diagnosis not present

## 2021-04-23 LAB — POCT URINALYSIS DIPSTICK
Bilirubin, UA: NEGATIVE
Blood, UA: NEGATIVE
Glucose, UA: NEGATIVE
Ketones, UA: NEGATIVE
Leukocytes, UA: NEGATIVE
Nitrite, UA: NEGATIVE
Protein, UA: POSITIVE — AB
Spec Grav, UA: 1.025 (ref 1.010–1.025)
Urobilinogen, UA: 0.2 E.U./dL
pH, UA: 6 (ref 5.0–8.0)

## 2021-04-23 MED ORDER — CEPHALEXIN 500 MG PO CAPS: 500.0000 mg | ORAL_CAPSULE | Freq: Two times a day (BID) | ORAL | 0 refills | Status: DC

## 2021-04-23 NOTE — Progress Notes (Signed)
° °  Subjective:   Patient ID: Deanna Levine, female    DOB: 2000-08-14, 21 y.o.   MRN: [id number]  HPI The patient is a 21 YO female coming in for concerns.  Review of Systems  Constitutional:  Positive for activity change.  HENT:  Positive for congestion, ear discharge and ear pain.   Eyes: Negative.   Respiratory:  Negative for cough, chest tightness and shortness of breath.   Cardiovascular:  Negative for chest pain, palpitations and leg swelling.  Gastrointestinal:  Negative for abdominal distention, abdominal pain, constipation, diarrhea, nausea and vomiting.  Genitourinary:  Positive for dysuria and frequency.  Musculoskeletal:  Positive for arthralgias and gait problem.  Skin:  Positive for wound.  Psychiatric/Behavioral: Negative.     Objective:  Physical Exam Constitutional:      Appearance: [name] well-developed.  HENT:     Head: Normocephalic and atraumatic.     Ears:     Comments: TM bulging bilaterally with cloudy fluid Cardiovascular:     Rate and Rhythm: Normal rate and regular rhythm.  Pulmonary:     Effort: Pulmonary effort is normal. No respiratory distress.     Breath sounds: Normal breath sounds. No wheezing or rales.  Abdominal:     General: Bowel sounds are normal. There is no distension.     Palpations: Abdomen is soft.     Tenderness: There is no abdominal tenderness. There is no rebound.  Musculoskeletal:        General: Tenderness present.     Cervical back: Normal range of motion.  Skin:    General: Skin is warm and dry.  Neurological:     Mental Status: [name] alert and oriented to person, place, and time.     Coordination: Coordination normal.    Vitals:   04/23/21 0948  BP: 126/80  Pulse: 88  Resp: 18  Temp: 97.9 F (36.6 C)  SpO2: 98%  Weight: 216 lb (98 kg)  Height: 5' 4"  (1.626 m)    This visit occurred during the SARS-CoV-2 public health emergency.  Safety protocols were in place, including screening questions prior to the  visit, additional usage of staff PPE, and extensive cleaning of exam room while observing appropriate contact time as indicated for disinfecting solutions.   Assessment & Plan:

## 2021-04-23 NOTE — Patient Instructions (Addendum)
We will get you in with GI and the rheumatologist.   We are sending in keflex to take 1 pill twice a day for 5 days to help the sinuses and the bladder.

## 2021-04-24 ENCOUNTER — Ambulatory Visit: Payer: 59

## 2021-04-24 ENCOUNTER — Encounter: Payer: Self-pay | Admitting: Internal Medicine

## 2021-04-24 DIAGNOSIS — J329 Chronic sinusitis, unspecified: Secondary | ICD-10-CM | POA: Insufficient documentation

## 2021-04-24 DIAGNOSIS — R197 Diarrhea, unspecified: Secondary | ICD-10-CM | POA: Insufficient documentation

## 2021-04-24 DIAGNOSIS — Z09 Encounter for follow-up examination after completed treatment for conditions other than malignant neoplasm: Secondary | ICD-10-CM

## 2021-04-24 DIAGNOSIS — R300 Dysuria: Secondary | ICD-10-CM | POA: Insufficient documentation

## 2021-04-24 NOTE — Assessment & Plan Note (Signed)
POC U/A done without signs of infection so will not treat.

## 2021-04-24 NOTE — Assessment & Plan Note (Signed)
Referral back to rheumatlogy. They saw Dr. Benjamine Mola about 1-2 years ago and he felt it was non-specific at that time but she has had progression of generalized joint pains since that time.

## 2021-04-24 NOTE — Assessment & Plan Note (Signed)
Seeing podiatry and they are struggling to help with her contracted achilles. This is causing her to not be able to work and she is in process of getting disability.

## 2021-04-24 NOTE — Progress Notes (Signed)
CASE MANAGEMENT VISIT  Session Start time: 9:50  Session End time: 10:05 Total time: 15 minutes  Type of Service:CASE MANAGEMENT Interpretor:No. Interpretor Name and Language: N/A  Summary of Today's Visit: Mom spoke with/went to social services yesterday. Was told Deanna Levine can't be approved for SNAP while living at home until she is 21 y/o, as parent income is being counted with application. Mom had a phone call with Ms. Purvis - Ms. Mcclean assigned as case worker - called mom yesterday  Ms. Purvis sent everything to [facility] - may take up to 90 days SSI is still being processed.  Plan for Next Visit:     Deanna Levine  [facility] Coordinator

## 2021-04-24 NOTE — Assessment & Plan Note (Signed)
New diarrhea unclear if linked to foods. Mom with UC so they wish referral to GI. She does also have elevated ESR in the past with non-specific joint pains.

## 2021-04-24 NOTE — Assessment & Plan Note (Signed)
Frontal and likely developing ear infection. Rx keflex 5 day course to treat.

## 2021-04-27 ENCOUNTER — Ambulatory Visit (INDEPENDENT_AMBULATORY_CARE_PROVIDER_SITE_OTHER): Payer: 59 | Admitting: Neurology

## 2021-04-27 ENCOUNTER — Other Ambulatory Visit: Payer: Self-pay

## 2021-04-27 ENCOUNTER — Encounter: Payer: Self-pay | Admitting: Internal Medicine

## 2021-04-27 DIAGNOSIS — Z8669 Personal history of other diseases of the nervous system and sense organs: Secondary | ICD-10-CM

## 2021-04-28 ENCOUNTER — Encounter: Payer: Self-pay | Admitting: Gastroenterology

## 2021-04-28 MED ORDER — CEPHALEXIN 250 MG/5ML PO SUSR
500.0000 mg | Freq: Two times a day (BID) | ORAL | 0 refills | Status: AC
Start: 2021-04-28 — End: 2021-05-03

## 2021-04-29 ENCOUNTER — Telehealth (INDEPENDENT_AMBULATORY_CARE_PROVIDER_SITE_OTHER): Payer: Self-pay

## 2021-04-29 NOTE — Telephone Encounter (Signed)
Fax received by [employer] to renew PA for [facility] 7.20m/ 0.564m ?PA initiated in covermymeds: ? ? ?

## 2021-04-30 ENCOUNTER — Other Ambulatory Visit: Payer: Self-pay

## 2021-04-30 ENCOUNTER — Ambulatory Visit: Payer: 59

## 2021-04-30 DIAGNOSIS — M24573 Contracture, unspecified ankle: Secondary | ICD-10-CM

## 2021-04-30 DIAGNOSIS — Z9889 Other specified postprocedural states: Secondary | ICD-10-CM

## 2021-04-30 DIAGNOSIS — M2142 Flat foot [pes planus] (acquired), left foot: Secondary | ICD-10-CM

## 2021-04-30 DIAGNOSIS — M2141 Flat foot [pes planus] (acquired), right foot: Secondary | ICD-10-CM

## 2021-04-30 DIAGNOSIS — Q6689 Other  specified congenital deformities of feet: Secondary | ICD-10-CM

## 2021-04-30 DIAGNOSIS — E119 Type 2 diabetes mellitus without complications: Secondary | ICD-10-CM

## 2021-04-30 NOTE — Progress Notes (Signed)
SITUATION ?Reason for Consult: Follow-up with hanger made articulated AFOs, shoe lift, and buttresses as well as [employer] foot orthotics ?Patient / Caregiver Report: Dr. Sherryle Lis wants to know if she needs this level of bracing intervention or if we can stabilize her tarsal coalition with a SMO. ? ?OBJECTIVE DATA ?History / Diagnosis:  ?  ICD-10-CM   ?1. Type 2 diabetes mellitus without complication, without long-term current use of insulin (HCC)  E11.9   ?  ?2. Coalition, calcaneal tarsal  Q66.89   ?  ?3. Pes planus of both feet  M21.41   ? M21.42   ?  ?4. Post-operative state  Z98.890   ?  ?5. Equinus contracture of ankle  M24.573   ?  ? ? ?Change in Pathology: Improved - surgical intervention and physical therapy has improved ambulation and reduced pain ? ?ACTIONS PERFORMED ?Patient's equipment was checked for structural stability and fit. Patient is using a pair of [id number] articulated mid calf AFOs with dorsiflexion assist tamaracks. Patient does not present with drop foot and does not appear to benefit from the stability the braces provide. Patient's right shoe has a 1" lift and both shoes have lateral buttresses. Patient's gait pattern without shoes and braces does not indicate that the buttresses currently serve any function, but as they were constructed pre-operatively their design makes sense. Due to the recent nature of the braces, it is recommended patient receive a pre-certification to determine coverage for [employer] Device(s) intact and fit is excellent. All questions answered and concerns addressed. ? ?PLAN ?Insurance provider will be contacted to determine coverage and patient and family will be contacted to make an informed decision of how to proceed with orthotic care. All questions answered and concerns addressed. Plan of care discussed with and agreed upon by patient / caregiver. ? ?

## 2021-05-01 NOTE — Telephone Encounter (Signed)
APPROVED thru 04/30/2022: ? ? ?

## 2021-05-05 ENCOUNTER — Other Ambulatory Visit: Payer: Self-pay

## 2021-05-05 ENCOUNTER — Other Ambulatory Visit (HOSPITAL_COMMUNITY): Payer: Self-pay

## 2021-05-05 ENCOUNTER — Ambulatory Visit (INDEPENDENT_AMBULATORY_CARE_PROVIDER_SITE_OTHER): Payer: 59 | Admitting: Pharmacist

## 2021-05-05 ENCOUNTER — Telehealth (INDEPENDENT_AMBULATORY_CARE_PROVIDER_SITE_OTHER): Payer: Self-pay | Admitting: Pharmacist

## 2021-05-05 VITALS — Wt 216.0 lb

## 2021-05-05 DIAGNOSIS — E11628 Type 2 diabetes mellitus with other skin complications: Secondary | ICD-10-CM

## 2021-05-05 LAB — POCT GLUCOSE (DEVICE FOR HOME USE): POC Glucose: 179 mg/dL — AB (ref 70–99)

## 2021-05-05 MED ORDER — DEXCOM G7 SENSOR MISC: 1.0000 | 5 refills | Status: DC

## 2021-05-05 MED ORDER — MOUNJARO 10 MG/0.5ML ~~LOC~~ SOAJ: 10.0000 mg | SUBCUTANEOUS | 2 refills | Status: DC

## 2021-05-05 NOTE — Telephone Encounter (Signed)
Please run benefits investigation for Dexcom G7 CGM device. This is not a specialty medication and can be filled at the local pharmacy. ?  ?Dexcom G7 sensors (1 box (each box has 3 sensors), 30 day supply), NDC [phone] ?  ? ?Dexcom G7 receiver (1 box (each box has 1 receiver), 30 or 365 day supply), NDC [phone]  ? ?Thank you for involving clinical pharmacist/diabetes educator to assist in providing this patient's care.  ?  ?Drexel Iha, PharmD, BCACP, [place], CPP ? ?

## 2021-05-05 NOTE — Telephone Encounter (Signed)
Attempted to submit PA for Mounjaro 10 mg on covermymeds on 05/05/2021 ? ?Received the following message ? ?"This medication or product was previously approved on [id number] from 2021-04-29 to 2022-04-30. **Please note: This request was submitted electronically. Formulary lowering, tiering exception, cost reduction and/or pre-benefit determination review (including prospective Medicare hospice reviews) requests cannot be requested using this method of submission. Providers contact us at [phone] for further assistance." ? ?Will attempt to send in prescription for Mounjaro 10 mg.  ? ?Thank you for involving clinical pharmacist/diabetes educator to assist in providing this patient's care.  ? ?Drexel Iha, PharmD, BCACP, [place], CPP ? ?

## 2021-05-05 NOTE — Telephone Encounter (Signed)
Discussed with mother who verbalized understanding.  ? ?She will notify me if she runs into any issues getting Mounjaro 10 mg from the pharmacy. ? ?Thank you for involving clinical pharmacist/diabetes educator to assist in providing this patient's care.  ? ?Drexel Iha, PharmD, BCACP, [place], CPP ? ?
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
## 2021-05-05 NOTE — Addendum Note (Signed)
Addended by: Ellwood Handler on: 05/05/2021 02:01 PM ? ? Modules accepted: Orders ? ?

## 2021-05-05 NOTE — Telephone Encounter (Signed)
Discussed cost with mother and she is comfortable with cost. ? ?Sent in [place] prescription to the following pharmacy: ? ?[facility] [phone]  ?[place], [place] [address]  ?Phone:  [phone]  Fax:  [phone]  ?DEA #:  [id number]  ?DAW Reason: --  ? ? ?Advised patient to contact office to schedule appt with my once they get Dexcom. ? ?Thank you for involving clinical pharmacist/diabetes educator to assist in providing this patient's care.  ? ?Drexel Iha, PharmD, BCACP, [place], CPP ? ?

## 2021-05-06 ENCOUNTER — Telehealth: Payer: Self-pay | Admitting: Neurology

## 2021-05-06 NOTE — Telephone Encounter (Signed)
Patient mom called asking about results for [facility] EEG. ?

## 2021-05-07 NOTE — Telephone Encounter (Signed)
Pt called back in wanting EEG results. ?

## 2021-05-09 ENCOUNTER — Encounter (INDEPENDENT_AMBULATORY_CARE_PROVIDER_SITE_OTHER): Payer: Self-pay | Admitting: Pharmacist

## 2021-05-11 MED ORDER — DEXCOM G7 RECEIVER DEVI: 1.0000 | 1 refills | Status: DC

## 2021-05-11 NOTE — Telephone Encounter (Signed)
Pt scheduled to see Dr. Delice Lesch on 3/20. ?

## 2021-05-11 NOTE — Telephone Encounter (Signed)
Per Dr. Delice Lesch:  let them know that I read the EEG and my specialty is seizures, and I would like to go over results with them.  ? ?Called and spoke to patients mother and informed her per above from Dr. Delice Lesch. Informed patients mother that we would like to set up a new patient appt with Dr. Delice Lesch and Dr. Delice Lesch will go over results with her at her appt. Patients mother is ok with this and aware that someone from the front will be contacting her to get patient schedule with Dr. Delice Lesch. ? ?Patients mother also wanted to give [name] a message and let her know that patient has been diagnosed with White-Sutton Syndrome by her genetic doctor. Patient will be seeing a Genetic doctor with [facility] in June. Patients mother stated there is a letter that was sent about this diagnosis and should be in patients chart. If it is not and is needed, patients mother can provide a copy for [name].   ? ?Message sent to Carmell Austria to contact patients mother for new patient appointment with Dr. Delice Lesch. ? ? ?

## 2021-05-14 ENCOUNTER — Other Ambulatory Visit: Payer: Self-pay

## 2021-05-14 ENCOUNTER — Encounter (INDEPENDENT_AMBULATORY_CARE_PROVIDER_SITE_OTHER): Payer: Self-pay | Admitting: Pediatric Endocrinology

## 2021-05-14 ENCOUNTER — Ambulatory Visit (INDEPENDENT_AMBULATORY_CARE_PROVIDER_SITE_OTHER): Payer: 59 | Admitting: Pediatric Endocrinology

## 2021-05-14 VITALS — BP 116/76 | HR 88 | Ht 64.21 in | Wt 214.2 lb

## 2021-05-14 DIAGNOSIS — E11628 Type 2 diabetes mellitus with other skin complications: Secondary | ICD-10-CM | POA: Diagnosis not present

## 2021-05-14 DIAGNOSIS — N938 Other specified abnormal uterine and vaginal bleeding: Secondary | ICD-10-CM | POA: Diagnosis not present

## 2021-05-14 DIAGNOSIS — Q8789 Other specified congenital malformation syndromes, not elsewhere classified: Secondary | ICD-10-CM

## 2021-05-14 LAB — POCT GLUCOSE (DEVICE FOR HOME USE): Glucose Fasting, POC: 145 mg/dL — AB (ref 70–99)

## 2021-05-14 MED ORDER — NORETHINDRONE ACETATE 5 MG PO TABS
5.0000 mg | ORAL_TABLET | Freq: Two times a day (BID) | ORAL | 3 refills | Status: DC | PRN
Start: 2021-05-14 — End: 2022-07-05
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
## 2021-05-14 NOTE — Patient Instructions (Signed)
? ?  Norethindrone- this is the same progestin that is in your Nexplanon- this is just EXTRA because the [facility] is decreasing how well it works.  ? ?If you are spotting- take 1 pill a day ?If you are starting a period- take 2 pills a day ?If your period is heavy- take 3 pills a day.  ? ?When your period stops- you can stop the norethindrone.  ? ? ?

## 2021-05-18 ENCOUNTER — Encounter (INDEPENDENT_AMBULATORY_CARE_PROVIDER_SITE_OTHER): Payer: Self-pay | Admitting: Pediatric Endocrinology

## 2021-05-18 ENCOUNTER — Ambulatory Visit (INDEPENDENT_AMBULATORY_CARE_PROVIDER_SITE_OTHER): Payer: 59 | Admitting: Neurology

## 2021-05-18 ENCOUNTER — Other Ambulatory Visit: Payer: Self-pay

## 2021-05-18 ENCOUNTER — Encounter: Payer: Self-pay | Admitting: Internal Medicine

## 2021-05-18 ENCOUNTER — Encounter: Payer: Self-pay | Admitting: Neurology

## 2021-05-18 VITALS — BP 114/78 | HR 73 | Resp 20 | Ht 64.5 in | Wt 217.0 lb

## 2021-05-18 DIAGNOSIS — G40409 Other generalized epilepsy and epileptic syndromes, not intractable, without status epilepticus: Secondary | ICD-10-CM | POA: Diagnosis not present

## 2021-05-18 NOTE — Progress Notes (Addendum)
? ?NEUROLOGY CONSULTATION NOTE ? ?Deanna Levine ?MRN: [id number] ?DOB: Dec 05, 2000 ? ?Referring provider: Dr. Narda Amber ?Primary care provider: Dr. Pricilla Holm ? ?Reason for consult:  abnormal EEG ? ?Dear Dr Posey Pronto: ? ?Thank you for your kind referral of Deanna Levine for consultation of the above symptoms. Although her history is well known to you, please allow me to reiterate it for the purpose of our medical record. The patient was accompanied to the clinic by her father who also provides collateral information. Records and images were personally reviewed where available. ? ? 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 She was seen by [name] in August 2021 for concern for seizures. She was previously followed by pediatric neurologist Dr. Gaynell Face, notes were reviewed. Seizures began at age 21 with multiple absence seizures daily where she would drop her hand to her side and begin drooling, unresponsive for around 2 minutes followed by sleepiness. No convulsive seizures. At that time, MRI brain and initial EEG were normal. She was treated with carbamazepine with no further seizures after 2010, however repeat EEG in 2012 reported diphasic sharply contoured slow wave activity seen bilaterally synchronously in the central, temporal, and parietal region so she was continued on medication. She had another EEG in 2015 which was normal and she was tapered off carbamazepine. Of note, on her 2017 clinic visit, her mother reported that she stares sometimes but it seems to be daydreaming or not paying attention rather than seizure activity because she would  respond when called. She presented to [name] in 2021 with her mother reporting very brief staring spells lasting a few seconds, different from her typical seizures because they were much shorter and would not recur. They appeared as if she was daydreaming or not paying attention, worse with stress. [name] accompanied today by her father who has not seen the staring episodes but states that she does daydream and sometimes would not respond, saying she does not hear them calling her. She states she feels good. She denies any gaps in time, olfactory/gustatory hallucinations, focal numbness/tingling, myoclonic jerks. No headaches, dizziness, vision changes, no falls. She has been having orthopedic difficulties in her feet and hands and was ultimately diagnosed with WSS a few months ago. She was previously working but has been taken out of work because she is unable to stand for prolonged periods and it affects her muscle skills. She manages her own medications, her father reports she is pretty good with this. Sleep is good, she sleeps 9-10 hours. She states her mood is okay, her father notes there has been some stress recently.  ? ?Epilepsy Risk Factors:  White-Sutton Syndrome. She had global developmental delay since infancy. There is no history of febrile convulsions, CNS infections such as meningitis/encephalitis, significant traumatic brain injury, neurosurgical procedures, or family history of seizures. ? ?Prior AEDs: carbamazepine ? ?Laboratory Data: ?EEGs: ?EEG 2012 reported diphasic sharply contoured slow wave activity seen bilaterally synchronously in the central, temporal, and parietal region ?EEG normal 2015 and 2018 ?48-hour EEG 04/2021 abnormal with rare generalized 4 Hz spike and wave discharges with frontal predominance seen in sleep. ? ?MRI brain in 2003 unremarkable. ? ? ? ?PAST MEDICAL  HISTORY: ?Past Medical History:  ?Diagnosis Date  ? Allergy   ? Complication of anesthesia   ? slow to wake up from  anesthesia, occ HA with anesthesia   ? Diabetes mellitus without complication ([place])   ? type 2  ? Difficulty swallowing pills   ? Dyspraxia   ? Eczema   ? Global developmental delay   ? Obesity (BMI 30-39.9)   ? PONV (postoperative nausea and vomiting)   ? Precocious female puberty   ? Seizures ([place])   ? last seizure 2012 - no current med  ? ? ?PAST SURGICAL HISTORY: ?Past Surgical History:  ?Procedure Laterality Date  ? ADENOIDECTOMY    ? bone removal Left 01/16/2021  ? foot  ? GASTROCNEMIUS RECESSION Bilateral 04/25/2020  ? Procedure: BILATERAL GASTROCNEMIUS RECESSION;  Surgeon: Newt Minion, MD;  Location: [facility];  Service: Orthopedics;  Laterality: Bilateral;  ? [place] IMPLANT  05/06/2011  ? Procedure: SUPPRELIN IMPLANT;  Surgeon: Jerilynn Mages. Gerald Stabs, MD;  Location: [facility];  Service: Pediatrics;  Laterality: Right;  ? SUPPRELIN IMPLANT Right 06/14/2013  ? Procedure: REMOVAL OF SUPPRELIN IMPLANT FROM RIGHT UPPER ARM;  Surgeon: Jerilynn Mages. Gerald Stabs, MD;  Location: [facility];  Service: Pediatrics;  Laterality: Right;  ? TONSILLECTOMY    ? TONSILLECTOMY AND ADENOIDECTOMY    ? TYMPANOSTOMY TUBE PLACEMENT    ? x 2  ? ? ?MEDICATIONS: ?Current Outpatient Medications on File Prior to Visit  ?Medication Sig Dispense Refill  ? azelastine (ASTELIN) 0.1 % nasal spray Place 1 spray into both nostrils 2 (two) times daily. Use in each nostril as directed 30 mL 12  ? cetirizine (ZYRTEC) 10 MG tablet Take 10 mg by mouth as needed.    ? chlorhexidine (HIBICLENS) 4 % external liquid Apply topically daily as needed. 120 mL 0  ? cholecalciferol (VITAMIN D3) 25 MCG (1000 UNIT) tablet Take 1,000 Units by mouth daily.    ? etonogestrel (NEXPLANON) 68 MG IMPL implant 1 each (68 mg total) by Subdermal route once. 1 each 0  ? fluticasone (FLONASE) 50 MCG/ACT nasal spray SHAKE LIQUID AND USE 2 SPRAYS IN EACH NOSTRIL DAILY 16 g 6  ? meloxicam (MOBIC) 15 MG tablet Take 15 mg by mouth daily.    ? Multiple  Vitamins-Minerals (MULTIVITAMIN ADULT) CHEW Chew 1 tablet by mouth daily.    ? norethindrone (AYGESTIN) 5 MG tablet Take 1 tablet (5 mg total) by mouth 2 (two) times daily as needed (bleeding). For spotting take 1 tab daily. If flow take 2 tabs daily. If heavy bleeding take 3 tabs daily. When bleeding has stopped you can stop the medication. 180 tablet 3  ? olopatadine (PATANOL) 0.1 % ophthalmic solution Place 1 drop into both eyes 2 (two) times daily as needed for allergies.    ? spironolactone (ALDACTONE) 50 MG tablet Take 50 mg by mouth daily.    ? tirzepatide (MOUNJARO) 10 MG/0.5ML Pen Inject 10 mg into the skin once a week. 2 mL 2  ? acetaminophen (LIQUID ACETAMINOPHEN) 160 MG/5ML liquid Take 20 mLs (640 mg total) by mouth every 6 (six) hours as needed for pain. (Patient not taking: Reported on 05/14/2021) 473 mL 0  ? blood glucose meter kit and supplies Dispense based on patient and insurance preference. Use up to four times daily as directed. (FOR ICD-10 E10.9, E11.9). (Patient not taking: Reported on 05/14/2021) 1 each 0  ? Blood Glucose Monitoring Suppl (ONETOUCH VERIO) w/Device KIT 1 kit by Does not  apply route as directed. Use glucometer to check sugar up to 3x per day (E11.9) (Patient not taking: Reported on 05/14/2021) 1 kit 3  ? Continuous Blood Gluc Receiver (DEXCOM G6 RECEIVER) [place] 1 Device by Does not apply route as directed. (Patient not taking: Reported on 05/05/2021) 1 each 2  ? Continuous Blood Gluc Receiver ([place]) [place] 1 each by Does not apply route as directed. Use with G7 sensor to check blood sugars (Patient not taking: Reported on 05/14/2021) 1 each 1  ? Continuous Blood Gluc Sensor (DEXCOM G6 SENSOR) MISC Inject 1 applicator into the skin as directed. (change sensor every 10 days) (Patient not taking: Reported on 05/05/2021) 3 each 11  ? Continuous Blood Gluc Sensor (DEXCOM G7 SENSOR) MISC Inject 1 Device into the skin as directed. Change sensor every 10 days. (Patient not taking:  Reported on 05/14/2021) 3 each 5  ? Continuous Blood Gluc Transmit (DEXCOM G6 TRANSMITTER) MISC Inject 1 Device into the skin as directed. (re-use up to 8x with each new sensor) (Patient not taking: Reported o

## 2021-05-18 NOTE — Patient Instructions (Signed)
Good to meet you! ? ?The prolonged EEG showed rare markers indicating a tendency for seizures to occur. Since they are not happening very frequently, we can monitor them for the next 3 months, please keep a calendar of when you notice the brief staring episodes. On your next visit, we can discuss if we continue to monitor or if we start seizure medication. The medication we can start is called Lamotrigine or Lamictal, we will start a very low dose if we proceed. ? ? ?Follow-up in 3 months, call for any changes.  ?

## 2021-05-20 ENCOUNTER — Encounter: Payer: Self-pay | Admitting: Gastroenterology

## 2021-05-20 ENCOUNTER — Ambulatory Visit (INDEPENDENT_AMBULATORY_CARE_PROVIDER_SITE_OTHER): Payer: 59 | Admitting: Gastroenterology

## 2021-05-20 ENCOUNTER — Other Ambulatory Visit: Payer: 59

## 2021-05-20 ENCOUNTER — Encounter: Payer: Self-pay | Admitting: Neurology

## 2021-05-20 ENCOUNTER — Other Ambulatory Visit (INDEPENDENT_AMBULATORY_CARE_PROVIDER_SITE_OTHER): Payer: Self-pay | Admitting: Pediatric Endocrinology

## 2021-05-20 ENCOUNTER — Encounter (INDEPENDENT_AMBULATORY_CARE_PROVIDER_SITE_OTHER): Payer: Self-pay | Admitting: Pharmacist

## 2021-05-20 VITALS — BP 136/86 | HR 62 | Ht 64.5 in | Wt 218.0 lb

## 2021-05-20 DIAGNOSIS — R7982 Elevated C-reactive protein (CRP): Secondary | ICD-10-CM | POA: Diagnosis not present

## 2021-05-20 DIAGNOSIS — Z8379 Family history of other diseases of the digestive system: Secondary | ICD-10-CM

## 2021-05-20 DIAGNOSIS — R197 Diarrhea, unspecified: Secondary | ICD-10-CM

## 2021-05-20 DIAGNOSIS — D75839 Thrombocytosis, unspecified: Secondary | ICD-10-CM

## 2021-05-20 DIAGNOSIS — E11628 Type 2 diabetes mellitus with other skin complications: Secondary | ICD-10-CM

## 2021-05-20 DIAGNOSIS — D509 Iron deficiency anemia, unspecified: Secondary | ICD-10-CM

## 2021-05-20 MED ORDER — DICYCLOMINE HCL 10 MG PO CAPS: 10.0000 mg | ORAL_CAPSULE | Freq: Three times a day (TID) | ORAL | 11 refills | Status: DC

## 2021-05-20 MED ORDER — NA SULFATE-K SULFATE-MG SULF 17.5-3.13-1.6 GM/177ML PO SOLN: 1.0000 | Freq: Once | ORAL | 0 refills | Status: AC

## 2021-05-20 NOTE — Progress Notes (Signed)
? ?Referring Provider: [name], * ?Primary Care Physician:  [name], MD ? ?Reason for Consultation:  Diarrhea, joint pain, Mom with UC ? ? ?IMPRESSION:  ?Diarrhea ?Microcytic anemia ?Thrombocytosis ?Elevated CRP ?Mother under evaluation by Dr. Fuller Plan for IBD ? ?The differential diagnosis of chronic diarrhea with a history of microscopic anemia and elevated CRP includes IBD and celiac disease. However, chronic diarrhea can also be caused by medications, irritable bowel syndrome, chronic infection, food intolerance, microscopic colitis, other functional GI disease. By history, this is less likely to be obstruction. ? ?PLAN: ?- GI pathogen panel, fecal calprotectin, pancreatic elastase ?- TTGA and IgA ?- Add a daily dose of Metamucil or Benefiber ?- Try dicyclomine 10 mg QID taken prior to meals ?- Colonoscopy at the hospital after obtaining clearance from  Neurology ? ? ?HPI: Deanna Levine is a 21 y.o. female referred by Dr. Sharlet Salina for further evaluation of diarrhea and abdominal cramping. She has concurrent joint pain and her Mom has ulcerative colitis and is followed by Dr. Fuller Plan. The history is obtained through the patient, her father who accompanies her to this appointment and review of her electronic health record. 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  She also has a history of lactose intolerance.  ? ?One month ago developed change in bowel habits ?Having several BM daily - up to 7 if not more - described as "drippy stools." ?Has lower abdominal cramping but it's unclear if this is related to the diarrhea.  ?Some rectal burning.  ?No blood or mucous in the stool.  ? ?Drinks zero-sugar gatorade and diet V8. ?Eating bananas may have provided relief.  ? ?Enjoys playing video games. 2K, [employer] to [employer], hoping to start a YouTube channel ? ?Mom under evaluation for IBD. Father with colon polyps. Paternal  great grandmother with stomach cancer. There is no other known family history of colon cancer or polyps. No family history of stomach cancer or other GI malignancy. No family history of inflammatory bowel disease or celiac.  ? ?TSH normal 1/23 ?CRP 20.8 2021 ?Hemoglobin 11.2, MCV 67.8, RDW 17.7, platelets 409 ? ?No prior abdominal imaging. No prior endoscopic evaluation.  ? ? ?Past Medical History:  ?Diagnosis Date  ? Allergy   ? Complication of anesthesia   ? slow to wake up from anesthesia, occ HA with anesthesia   ? Diabetes mellitus without complication ([place])   ? type 2  ? Difficulty swallowing pills   ? Dyspraxia   ? Eczema   ? Global developmental delay   ? Obesity (BMI 30-39.9)   ? PONV (postoperative nausea and vomiting)   ? Precocious female puberty   ? Seizures ([place])   ? last seizure 2012 - no current med  ? White-Sutton syndrome   ? ? ?Past Surgical History:  ?Procedure Laterality Date  ? ADENOIDECTOMY    ? bone removal Left 01/16/2021  ? foot  ? GASTROCNEMIUS RECESSION Bilateral 04/25/2020  ? Procedure: BILATERAL GASTROCNEMIUS RECESSION;  Surgeon: Newt Minion, MD;  Location: [facility];  Service: Orthopedics;  Laterality: Bilateral;  ? [place] IMPLANT  05/06/2011  ? Procedure: SUPPRELIN IMPLANT;  Surgeon: Jerilynn Mages. Gerald Stabs, MD;  Location: [facility];  Service: Pediatrics;  Laterality: Right;  ? SUPPRELIN IMPLANT Right 06/14/2013  ? Procedure: REMOVAL OF SUPPRELIN IMPLANT FROM RIGHT UPPER ARM;  Surgeon: Jerilynn Mages. Gerald Stabs, MD;  Location: [facility];  Service: Pediatrics;  Laterality: Right;  ? TONSILLECTOMY    ? TONSILLECTOMY AND ADENOIDECTOMY    ? TYMPANOSTOMY TUBE PLACEMENT    ? x 2  ? ? ? ? ?Current Outpatient Medications  ?Medication Sig Dispense Refill  ? azelastine (ASTELIN) 0.1 % nasal spray Place 1 spray into both nostrils 2 (two) times daily. Use in each nostril as directed 30 mL 12  ? cetirizine (ZYRTEC) 10 MG tablet Take 10 mg by mouth as needed.    ?  chlorhexidine (HIBICLENS) 4 % external liquid Apply topically daily as needed. 120 mL 0  ? cholecalciferol (VITAMIN D3) 25 MCG (1000 UNIT) tablet Take 1,000 Units by mouth daily.    ? Continuous Blood Gluc Receiver ([place]) [place] 1 each by Does not apply route as directed. Use with G7 sensor to check blood sugars 1 each 1  ? Continuous Blood Gluc Sensor (DEXCOM G7 SENSOR) MISC Inject 1 Device into the skin as directed. Change sensor every 10 days. 3 each 5  ? etonogestrel (NEXPLANON) 68 MG IMPL implant 1 each (68 mg total) by Subdermal route once. 1 each 0  ? fluticasone (FLONASE) 50 MCG/ACT nasal spray SHAKE LIQUID AND USE 2 SPRAYS IN EACH NOSTRIL DAILY 16 g 6  ? meloxicam (MOBIC) 15 MG tablet Take 15 mg by mouth daily.    ? Menaquinone-7 (VITAMIN K2) 100 MCG CAPS Take 100 mcg by mouth daily.    ? Multiple Vitamins-Minerals (MULTIVITAMIN ADULT) CHEW Chew 1 tablet by mouth daily.    ? norethindrone (AYGESTIN) 5 MG tablet Take 1 tablet (5 mg total) by mouth 2 (two) times daily as needed (bleeding). For spotting take 1 tab daily. If flow take 2 tabs daily. If heavy bleeding take 3 tabs daily. When bleeding has stopped you can stop the medication. 180 tablet 3  ? olopatadine (PATANOL) 0.1 % ophthalmic solution Place 1 drop into both eyes 2 (two) times daily as needed for allergies.    ? spironolactone (ALDACTONE) 50 MG tablet Take 50 mg by mouth daily.    ? tirzepatide (MOUNJARO) 10 MG/0.5ML Pen Inject 10 mg into the skin once a week. 2 mL 2  ? ?No current facility-administered medications for this visit.  ? ? ?Allergies as of 05/20/2021  ? (No Known Allergies)  ? ? ?Family History  ?Problem Relation Age of Onset  ? Diabetes Mother   ?     steroid induced; hx. colitis  ? Anesthesia problems Mother   ?     severe headache lasting 2-3 days  ? Rheum arthritis Mother   ? Ulcerative colitis Mother   ? Fibromyalgia Mother   ? Hypertension Maternal Grandmother   ? Hyperlipidemia Maternal Grandmother   ? Diabetes  Maternal Grandmother   ? Kidney disease Maternal Grandfather   ? Hypertension Maternal Grandfather   ? Heart disease Maternal Grandfather   ? Hyperlipidemia Maternal Grandfather   ? Kidney disease Paternal Grandmother   ? Hypertension Paternal Grandmother   ? Diabetes Paternal Grandmother   ? Heart disease Paternal Grandmother   ? Hyperlipidemia Paternal Grandmother   ? Hypertension Paternal Grandfather   ? Hyperlipidemia Paternal Grandfather   ? Henoch-Schonlein purpura Brother   ?     in remission  ? Asthma Maternal Aunt   ? Seizures Maternal Uncle   ? ? ?Social History  ? ?Socioeconomic History  ? Marital status: Single  ?  Spouse name: Not on file  ? Number of children: 12  ? Years of education: Not  on file  ? Highest education level: Not on file  ?Occupational History  ? Occupation: [employer]  ?Tobacco Use  ? Smoking status: Never  ?  Passive exposure: Never  ? Smokeless tobacco: Never  ?Vaping Use  ? Vaping Use: Never used  ?Substance and Sexual Activity  ? Alcohol use: No  ?  Alcohol/week: 0.0 standard drinks  ? Drug use: No  ? Sexual activity: Never  ?  Birth control/protection: None  ?Other Topics Concern  ? Not on file  ?Social History Narrative  ? Mykal is graduated. [name] doing average.   ? She enjoys playing softball and basketball.  ? Lives with her parents.   ? Not working.  ? Left handed  ? Two story home  ?   ? Had surgery on her legs in Feb and is physical therapy twice a week.  ? Left handed  ? No caffeine  ? ?Social Determinants of Health  ? ?Financial Resource Strain: Not on file  ?Food Insecurity: Not on file  ?Transportation Needs: Not on file  ?Physical Activity: Not on file  ?Stress: Not on file  ?Social Connections: Not on file  ?Intimate Partner Violence: Not on file  ? ? ?Review of Systems: ?12 system ROS is negative except as noted above with the addition of allergies, anxiety, fatigue, headaches, menstrual pain, swelling of her legs, excessive urination.  ? ?Physical  Exam: ?General:   Alert,  well-nourished, pleasant and cooperative in NAD ?Head:  Normocephalic and atraumatic. ?Eyes:  Sclera clear, no icterus.   Conjunctiva pink. ?Ears:  Normal auditory acuity. ?Nose:  No deformity, discharg

## 2021-05-20 NOTE — Patient Instructions (Addendum)
It was my pleasure to provide care to you today. Based on our discussion, I am providing you with my recommendations below: ? ?RECOMMENDATION(S):  ? ?I have recommended stool studies looking for inflammation and infection.  ? ?We will plan a colonoscopy if Neurology tells [name] it's safe to have anesthesia given your recent seizures.  ? ?Let's try to identify a dietary trigger. Please keep a records of what you eat and you diarrhea until we see each other again. ? ?During that time I would try the following (do only one change at a time) ?      - No carbonated beverages ?      - No artificial sweeteners ?      - Full lactose free trial: 3 weeks NO, milk, cheese, sour cream,ice cream, yogurt, creamer, baked goods (cookies/cakes/watch breads), chocolate (even dark chocolate), protein bars, dressing/condiments (watch). You won't live like this forever but for the trial need to be strict.  ? ?In the meantime, take a daily dose of Metamucil and try dicyclomine taken prior to meals.  ? ?LABS:  ? ?Please proceed to the basement level for lab work before leaving today. Press "B" on the elevator. The lab is located at the first door on the left as you exit the elevator. ? ?HEALTHCARE LAWS AND MY CHART RESULTS:  ? ?Due to recent changes in healthcare laws, you may see results of your imaging and/or laboratory studies on MyChart before I have had a chance to review them.  I understand that in some cases there may be results that are confusing or concerning to you. Please understand that not all results are received at the same time and often I may need to interpret multiple results in order to provide you with the best plan of care or course of treatment. Therefore, I ask that you please give me 48 hours to thoroughly review all your results before contacting my office for clarification.  ? ? ?COLONOSCOPY:  ? ?You have been scheduled for a colonoscopy. Please follow written instructions given to you at your visit today.  ? ?PREP:   ? ?Please pick up your prep supplies at the pharmacy within the next 1-3 days. ? ?INHALERS:  ? ?If you use inhalers (even only as needed), please bring them with you on the day of your procedure. ? ?COLONOSCOPY TIPS: ? ?To reduce nausea and dehydration, stay well hydrated for 3-4 days prior to the exam.  ?To prevent skin/hemorrhoid irritation - prior to wiping, put A&Dointment or vaseline on the toilet paper. ?Keep a towel or pad on the bed.  ?BEFORE STARTING YOUR PREP, drink  64oz of clear liquids in the morning. This will help to flush the colon and will ensure you are well hydrated!!!!  ?NOTE - This is in addition to the fluids required for to complete your prep. ?Use of a flavored hard candy, such as grape Anise Salvo, can counteract some of the flavor of the prep and may prevent some nausea.  ? ? ?FOLLOW UP: ? ?After your procedure, you will receive a call from my office staff regarding my recommendation for follow up. ? ?BMI: ? ?If you are age 63 or younger, your body mass index should be between 19-25. Your Body mass index is 36.84 kg/m?Marland Kitchen If this is out of the aformentioned range listed, please consider follow up with your Primary Care Provider.  ? ?MY CHART: ? ?The [facility] providers would like to encourage you to use MYCHART to communicate  with providers for non-urgent requests or questions.  Due to long hold times on the telephone, sending your provider a message by [facility] may be a faster and more efficient way to get a response.  Please allow 48 business hours for a response.  Please remember that this is for non-urgent requests.  ? ?Thank you for trusting me with your gastrointestinal care!   ? ?Thornton Park, MD, MPH ? ?

## 2021-05-21 ENCOUNTER — Encounter (INDEPENDENT_AMBULATORY_CARE_PROVIDER_SITE_OTHER): Payer: Self-pay | Admitting: Pharmacist

## 2021-05-22 MED ORDER — DICYCLOMINE HCL 20 MG PO TABS: ORAL_TABLET | ORAL | 3 refills | Status: DC

## 2021-05-24 LAB — GI PROFILE, STOOL, PCR
Adenovirus F 40/41: NOT DETECTED
Astrovirus: NOT DETECTED
C difficile toxin A/B: NOT DETECTED
Campylobacter: NOT DETECTED
Cryptosporidium: NOT DETECTED
Cyclospora cayetanensis: NOT DETECTED
Entamoeba histolytica: NOT DETECTED
Enteroaggregative E coli: NOT DETECTED
Enteropathogenic E coli: NOT DETECTED
Enterotoxigenic E coli: NOT DETECTED
Giardia lamblia: NOT DETECTED
Norovirus GI/GII: NOT DETECTED
Plesiomonas shigelloides: NOT DETECTED
Rotavirus A: NOT DETECTED
Salmonella: NOT DETECTED
Sapovirus: NOT DETECTED
Shiga-toxin-producing E coli: NOT DETECTED
Shigella/Enteroinvasive E coli: NOT DETECTED
Vibrio cholerae: NOT DETECTED
Vibrio: DETECTED — AB
Yersinia enterocolitica: NOT DETECTED

## 2021-05-25 ENCOUNTER — Other Ambulatory Visit: Payer: Self-pay

## 2021-05-25 ENCOUNTER — Telehealth: Payer: Self-pay | Admitting: *Deleted

## 2021-05-25 ENCOUNTER — Telehealth: Payer: Self-pay | Admitting: Gastroenterology

## 2021-05-25 DIAGNOSIS — A059 Bacterial foodborne intoxication, unspecified: Secondary | ICD-10-CM

## 2021-05-25 DIAGNOSIS — R197 Diarrhea, unspecified: Secondary | ICD-10-CM

## 2021-05-25 MED ORDER — CIPROFLOXACIN HCL 500 MG PO TABS: 500.0000 mg | ORAL_TABLET | Freq: Two times a day (BID) | ORAL | 0 refills | Status: AC

## 2021-05-25 NOTE — Telephone Encounter (Signed)
Patient is returning your call.  

## 2021-05-25 NOTE — Telephone Encounter (Signed)
Pt and Pt daughter Deanna Levine made aware of recent results and Dr. Tarri Glenn recommendations: ?Prescription sent to pharmacy: Deanna Levine made aware ?Deanna Levine stated that she does want a referral to Infectious Disease Consult: ?Referral placed in [place] made aware ?Deanna Levine verbalized understanding with all questions answered.  ?

## 2021-05-26 ENCOUNTER — Telehealth: Payer: Self-pay

## 2021-05-26 ENCOUNTER — Other Ambulatory Visit: Payer: Self-pay

## 2021-05-26 ENCOUNTER — Ambulatory Visit (INDEPENDENT_AMBULATORY_CARE_PROVIDER_SITE_OTHER): Payer: 59 | Admitting: Podiatry

## 2021-05-26 DIAGNOSIS — B351 Tinea unguium: Secondary | ICD-10-CM

## 2021-05-26 DIAGNOSIS — E119 Type 2 diabetes mellitus without complications: Secondary | ICD-10-CM

## 2021-05-26 DIAGNOSIS — M79675 Pain in left toe(s): Secondary | ICD-10-CM

## 2021-05-26 DIAGNOSIS — M79674 Pain in right toe(s): Secondary | ICD-10-CM

## 2021-05-26 LAB — CALPROTECTIN, FECAL: Calprotectin, Fecal: 65 ug/g (ref 0–120)

## 2021-05-26 NOTE — Progress Notes (Signed)
?  Subjective:  ?Patient ID: Deanna Levine, female    DOB: 31-Jul-2000,  MRN: [id number] ? ?Prisma Decarlo presents to clinic today for preventative diabetic foot care and thick, elongated toenails bilateral great toes which are tender when wearing enclosed shoe gear. ? ?Patient did not check blood glucose today. Patient states her DexCom is not connected. Her mother is present during today's visit.  ? ?Per mother, [facility] has removed buttress in shoe and she is now only using the heel lift. Glenis continues to have pain when ambulating post-surgery and Dr. Sherryle Lis is working on getting her special shoes to help alleviate her foot pain. ? ?New problem(s): None.  ? ?PCP is [name], MD , and last visit was April 23, 2021. ? ?No Known Allergies ? ?Review of Systems: Negative except as noted in the HPI. ? ?Objective: No changes noted in today's physical examination. ? ?Deanna Levine is a pleasant 21 y.o. female, obese in NAD. AAO X 3. ? ?Vascular Examination: ?CFT immediate b/l LE. Palpable DP/PT pulses b/l LE. Digital hair present b/l. Skin temperature gradient WNL b/l. No pain with calf compression b/l. No edema noted b/l. No cyanosis or clubbing noted b/l LE. ? ?Dermatological Examination: ?Pedal integument with normal turgor, texture and tone BLE. No open wounds b/l LE. Toenails bilateral great toes elongated, discolored, dystrophic, thickened, and crumbly with subungual debris and tenderness to dorsal palpation. No hyperkeratotic nor porokeratotic lesions present on today's visit. ? ?Musculoskeletal Examination: ?Muscle strength 5/5 to all lower extremity muscle groups bilaterally. Pes planus deformity noted bilateral LE. ? ?Neurological Examination: ?Protective sensation intact 5/5 intact bilaterally with 10g monofilament b/l. Vibratory sensation intact b/l. ? ? ?  Latest Ref Rng & Units 02/12/2021  ?  9:19 AM 11/13/2020  ?  9:51 AM 07/24/2020  ?  9:20 AM  ?Hemoglobin A1C  ?Hemoglobin-A1c 5.7  - 6.4 % 6.4   6.3   6.3    ? ?Assessment/Plan: ?1. Pain due to onychomycosis of toenails of both feet   ?2. Type 2 diabetes mellitus without complication, without long-term current use of insulin ([place])   ?-No new findings. No new orders. ?-[name] will be following up with Dr. Sherryle Lis for shoe order to alleviate foot pain. ?-Mycotic toenails bilateral great toes were debrided in length and girth with sterile nail nippers and dremel without iatrogenic bleeding. ?-Patient/POA to call should there be question/concern in the interim.  ? ?Return in about 3 months (around 08/26/2021). ? ?Marzetta Board, DPM  ?

## 2021-05-26 NOTE — Telephone Encounter (Signed)
Received a call from [employer] with following alert: ? ?[facility]  Detected Abnormal    ?Comment:                   Client Requested Flag  ? ?Appears Dr. Tarri Glenn has addressed alert and has sent a response to her nurse. Appears pt and her mother were informed of Dr. Payton Emerald recommendations. Referral placed to ID. Refer to Labs to further review correspondence and documentation. ?

## 2021-05-27 NOTE — Telephone Encounter (Signed)
Faxed clearance request to Dr. Delice Lesch.  ?

## 2021-05-30 LAB — PANCREATIC ELASTASE, FECAL: Pancreatic Elastase-1, Stool: >500 mcg/g

## 2021-05-31 ENCOUNTER — Encounter: Payer: Self-pay | Admitting: Podiatry

## 2021-06-01 DIAGNOSIS — M79676 Pain in unspecified toe(s): Secondary | ICD-10-CM

## 2021-06-01 NOTE — Telephone Encounter (Signed)
-----   Message from Cameron Sprang, MD sent at 05/29/2021 11:17 AM EDT ----- ?From the standpoint of seizures, there is no contraindication to medically necessary surgery or procedures. [name] not taking any medication for seizure at this time. ? ? ?Ellouise Newer, MD ?Epileptologist ?[facility] ? ? ?----- Message ----- ?From: Horris Latino, CMA ?Sent: 05/27/2021  10:34 AM EDT ?To: Cameron Sprang, MD ? ? ?

## 2021-06-02 ENCOUNTER — Other Ambulatory Visit: Payer: Self-pay

## 2021-06-02 ENCOUNTER — Encounter: Payer: Self-pay | Admitting: Internal Medicine

## 2021-06-02 ENCOUNTER — Ambulatory Visit (INDEPENDENT_AMBULATORY_CARE_PROVIDER_SITE_OTHER): Payer: 59 | Admitting: Internal Medicine

## 2021-06-02 ENCOUNTER — Encounter (INDEPENDENT_AMBULATORY_CARE_PROVIDER_SITE_OTHER): Payer: Self-pay | Admitting: Pharmacist

## 2021-06-02 VITALS — BP 140/84 | HR 87 | Temp 98.1°F | Resp 16 | Ht 64.0 in | Wt 220.0 lb

## 2021-06-02 DIAGNOSIS — R197 Diarrhea, unspecified: Secondary | ICD-10-CM

## 2021-06-02 DIAGNOSIS — A053 Foodborne Vibrio parahaemolyticus intoxication: Secondary | ICD-10-CM | POA: Diagnosis not present

## 2021-06-02 MED ORDER — DOXYCYCLINE MONOHYDRATE 25 MG/5ML PO SUSR: 100.0000 mg | Freq: Two times a day (BID) | ORAL | 0 refills | Status: DC

## 2021-06-02 MED ORDER — DOXYCYCLINE MONOHYDRATE 25 MG/5ML PO SUSR: 100.0000 mg | Freq: Two times a day (BID) | ORAL | 0 refills | Status: AC

## 2021-06-02 NOTE — Addendum Note (Signed)
Addended by: Tomi Bamberger on: 06/02/2021 10:37 AM ? ? Modules accepted: Orders ? ?

## 2021-06-02 NOTE — Progress Notes (Signed)
?  ? ? ? ? ?[facility] ? ?Reason for Consult:diarrhea, vibrio on pcr  ?Referring Provider: crawford ? ? ? ?Patient Active Problem List  ? Diagnosis Date Noted  ? DUB (dysfunctional uterine bleeding) 05/14/2021  ? Diarrhea 04/24/2021  ? Dysuria 04/24/2021  ? Sinusitis 04/24/2021  ? 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 Other adverse food reactions, not elsewhere classified, subsequent encounter 08/20/2020  ? Other allergic rhinitis 08/20/2020  ? Allergic conjunctivitis of both eyes 08/20/2020  ? Encounter for general adult medical examination with abnormal findings 07/03/2020  ? Leg length discrepancy 06/05/2020  ? Hidradenitis suppurativa 06/05/2020  ? Intellectual disability 06/05/2020  ? Achilles tendon contracture, bilateral   ? Elevated sedimentation rate 02/07/2020  ? Pain in right ankle and joints of right foot 02/07/2020  ? Anemia 12/29/2018  ? Type 2 diabetes mellitus ([place]) 08/15/2017  ? Elevated triglycerides with high cholesterol 12/09/2016  ? Partial epilepsy with impairment of consciousness ([place]) 08/16/2012  ? Mild intellectual disability 08/16/2012  ? Laxity of ligament 08/16/2012  ? Delayed milestones 08/16/2012  ? Obesity 06/07/2011  ? Dyspepsia 02/11/2011  ? Acanthosis nigricans 02/11/2011  ? Global developmental delay   ? Petit mal without grand mal seizures ([place])   ? Dyspraxia   ? ? ? ? ?HPI: Deanna Levine is a 21 y.o. female dm, seizure disorder, intellectual disability referred here for gi pcr of vibrio in setting chronic diarrhea ? ?[name] accompanied by her dad here today ? ?She was seen by gi about 2 weeks ago for 1 month of diarrhea. Watery up to 7 times a day ? ?She didn't recall any fever/chill, bloody stool at the onset ? ?She reports though eating sea food before this diarrhea illness occur. She ate shrimp/fish in a restaurant ([employer] in [place]). She ate with her parents; mother ate shrimp scampi. Only patient had symptoms ? ?She also ate sea food at  [employer] [facility] 2 days after that ? ?Diarrhea onset before [facility] visit.  ? ?Dairy makes it worse at this time ? ?Patient haven't taken any antibiotics ? ?She otherwise been feeling well ?Eating well - good appetite; no weight loss ?Occasional abdominal cramping with diarhea still; sx also with periods of normal solid stool ?No joint pain ?No skin rash ?No headache ?No fever/chill ? ?Review of Systems: ?ROS ? ?All other ros negative ? ? ? ? ? ? ?Past Medical History:  ?Diagnosis Date  ? Allergy   ? Complication of anesthesia   ? slow to wake up from anesthesia, occ HA with anesthesia   ? Diabetes mellitus without complication ([place])   ? type 2  ? Difficulty swallowing pills   ? Dyspraxia   ? Eczema   ? Global developmental delay   ? Obesity (BMI 30-39.9)   ? PONV (postoperative nausea and vomiting)   ? Precocious female puberty   ? Seizures ([place])   ? last seizure 2012 - no current med  ? White-Sutton syndrome   ? ? ?Social History  ? ?Tobacco Use  ? Smoking status: Never  ?  Passive exposure: Never  ? Smokeless tobacco: Never  ?Vaping Use  ? Vaping Use: Never used  ?Substance Use Topics  ? Alcohol use: No  ?  Alcohol/week: 0.0 standard drinks  ? Drug use: No  ? ? ?Family History  ?Problem Relation Age of Onset  ? Diabetes Mother   ?     steroid induced; hx. colitis  ? Anesthesia problems Mother   ?  severe headache lasting 2-3 days  ? Rheum arthritis Mother   ? Ulcerative colitis Mother   ? Fibromyalgia Mother   ? Hypertension Maternal Grandmother   ? Hyperlipidemia Maternal Grandmother   ? Diabetes Maternal Grandmother   ? Kidney disease Maternal Grandfather   ? Hypertension Maternal Grandfather   ? Heart disease Maternal Grandfather   ? Hyperlipidemia Maternal Grandfather   ? Kidney disease Paternal Grandmother   ? Hypertension Paternal Grandmother   ? Diabetes Paternal Grandmother   ? Heart disease Paternal Grandmother   ? Hyperlipidemia Paternal Grandmother   ? Hypertension Paternal Grandfather   ?  Hyperlipidemia Paternal Grandfather   ? Henoch-Schonlein purpura Brother   ?     in remission  ? Asthma Maternal Aunt   ? Seizures Maternal Uncle   ? ? ?No Known Allergies ? ?OBJECTIVE: ?Vitals:  ? 06/02/21 0843  ?BP: 140/84  ?Pulse: 87  ?Resp: 16  ?Temp: 98.1 ?F (36.7 ?C)  ?TempSrc: Temporal  ?SpO2: 99%  ?Weight: 220 lb (99.8 kg)  ?Height: 5' 4"  (1.626 m)  ? ?Body mass index is 37.76 kg/m?. ? ? ?Physical Exam ?General/constitutional: no distress, pleasant ?HEENT: Normocephalic, PER, Conj Clear, EOMI, Oropharynx clear ?Neck supple ?CV: rrr no mrg ?Lungs: clear to auscultation, normal respiratory effort ?Abd: Soft, Nontender ?Ext: no edema ?Skin: No Rash ?Neuro: nonfocal ?MSK: no peripheral joint swelling/tenderness/warmth; back spines nontender ? ?Lab: ?Lab Results  ?Component Value Date  ? WBC 8.6 02/12/2021  ? HGB 11.2 (L) 02/12/2021  ? HCT 36.6 02/12/2021  ? MCV 67.8 (L) 02/12/2021  ? PLT 409 (H) 02/12/2021  ? ?Last metabolic panel ?Lab Results  ?Component Value Date  ? GLUCOSE 111 (H) 02/12/2021  ? NA 138 02/12/2021  ? K 4.1 02/12/2021  ? CL 102 02/12/2021  ? CO2 22 02/12/2021  ? BUN 14 02/12/2021  ? CREATININE 0.64 02/12/2021  ? GFRNONAA >60 04/25/2020  ? CALCIUM 10.7 (H) 02/12/2021  ? PROT 8.4 (H) 02/12/2021  ? ALBUMIN 4.4 03/05/2019  ? LABGLOB 2.6 03/05/2019  ? AGRATIO 1.7 03/05/2019  ? BILITOT 0.5 02/12/2021  ? ALKPHOS 116 (H) 03/05/2019  ? AST 15 02/12/2021  ? ALT 15 02/12/2021  ? ANIONGAP 11 04/25/2020  ? ? ?Microbiology: ? ?Serology: ?3/23 pcr stool negative for cdiff, positive for vibrio (non cholera) ? ?Imaging: ? ? ?Assessment/plan: ?Problem List Items Addressed This Visit   ? ?  ? Other  ? Diarrhea - Primary  ? Relevant Orders  ? Stool Culture  ? ?Other Visit Diagnoses   ? ? 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 Relevant Orders  ? Stool Culture  ? ?  ? ? ?Interesting presentation. I am not awared of any food borne vibrio outbreak recently in [place] ? ?I am not sure what the  operating characteristics of gi pcr on different species of non-cholera vibrio as not all of them caused diarrheal illness ? ?If it was true vibrio infection, what I am hearing now is a post-infectious IBS type response which will resolve in several weeks ? ?It is worth a try to give her 7 days doxycycline (can't take pills so given oral suspension form) ? ?Give stool culture first for speciation/abx susceptibility testing prior to taking antibiotics ? ?Ibs type diet suggested ? ?See GI in may for colonoscopy/continued chronic diarrhea follow up then see me after that ? ? ? ? ?. ? ? ?Follow-up: Return in about 8 weeks (around 07/28/2021). ? ?Johnny Bridge T  Gale Journey, MD ?[facility] ?[place] ?[phone] pager   [phone] cell ?06/02/2021, 9:08 AM ? ?

## 2021-06-02 NOTE — Patient Instructions (Signed)
Please see me after your colonoscopy is done ? ? ?What I think happened is: ?You did get some non-cholerae vibrio infection, which often causes diarrheal illness ?Your body probably gotten rid of it, but now you have what we called post-infectious irritable bowel syndrome ? ? ?Your symptoms if I am right should start to go away in the next several weeks. For now, avoid big fatty meal, high dairy content (yogurt, milk, cheese) ? ? ?I will also prescribe you 7 days of doxycycline. Do not take this until you submit stool culture for [name]. ? ? ?

## 2021-06-03 ENCOUNTER — Other Ambulatory Visit: Payer: Self-pay

## 2021-06-03 ENCOUNTER — Other Ambulatory Visit: Payer: 59

## 2021-06-03 DIAGNOSIS — A053 Foodborne Vibrio parahaemolyticus intoxication: Secondary | ICD-10-CM

## 2021-06-03 DIAGNOSIS — R197 Diarrhea, unspecified: Secondary | ICD-10-CM

## 2021-06-07 LAB — VIBRIO CULTURE, STOOL
Micro Number:: 13230796
Specimen Quality:: ADEQUATE

## 2021-06-10 NOTE — Telephone Encounter (Signed)
Informed patient she is cleared for her procedure next month.  ?

## 2021-06-12 ENCOUNTER — Ambulatory Visit (INDEPENDENT_AMBULATORY_CARE_PROVIDER_SITE_OTHER): Payer: 59 | Admitting: Orthopedic Surgery

## 2021-06-12 ENCOUNTER — Ambulatory Visit: Payer: Self-pay

## 2021-06-12 ENCOUNTER — Encounter: Payer: Self-pay | Admitting: Orthopedic Surgery

## 2021-06-12 ENCOUNTER — Ambulatory Visit (INDEPENDENT_AMBULATORY_CARE_PROVIDER_SITE_OTHER): Payer: 59

## 2021-06-12 DIAGNOSIS — M79601 Pain in right arm: Secondary | ICD-10-CM

## 2021-06-12 DIAGNOSIS — G90513 Complex regional pain syndrome I of upper limb, bilateral: Secondary | ICD-10-CM

## 2021-06-12 DIAGNOSIS — G90511 Complex regional pain syndrome I of right upper limb: Secondary | ICD-10-CM

## 2021-06-12 DIAGNOSIS — G90512 Complex regional pain syndrome I of left upper limb: Secondary | ICD-10-CM

## 2021-06-12 DIAGNOSIS — M79602 Pain in left arm: Secondary | ICD-10-CM | POA: Diagnosis not present

## 2021-06-12 NOTE — Progress Notes (Signed)
? ?Office Visit Note ?  ?Patient: Deanna Levine           ?Date of Birth: [birth year]           ?MRN: [id number] ?Visit Date: 06/12/2021 ?             ?Requested by: [name], MD ?[place],  [place] [address] ?PCP: [name], MD ? ? ?Assessment & Plan: ?Visit Diagnoses:  ?Bilateral arm pain ? ? ?Plan: Discussed with patient and mom that she has pain along multiple areas of her bilateral upper extremities from the elbow into the hand.  Difficult to really pinpoint the area that seems to be most symptomatic.  X-rays of bilateral forearms were negative for bony injury or degenerative changes.  Patient is seeing rheumatology in the near future for work-up of diffuse bilateral upper and lower extremity pain.  I would defer to them for now given that she has no clearly defined surgical issue. ? ?Follow-Up Instructions: No follow-ups on file.  ? ?Orders:  ?Orders Placed This Encounter  ?Procedures  ? XR Forearm Right  ? XR Forearm Left  ? ?No orders of the defined types were placed in this encounter. ? ? ? ? Procedures: ?No procedures performed ? ? ?Clinical Data: ?No additional findings. ? ? ?Subjective: ?Chief Complaint  ?Patient presents with  ? Left Forearm - Follow-up  ? Right Forearm - Follow-up  ? ? ?This is a 21 year old left-hand-dominant female who presents with bilateral forearm wrist and hand pain.  This been going on for few months now.  Patient is unable to give a specific location of her pain but notes that she hurts from her elbows into her hands bilaterally.  She denies any injury to bilateral upper extremities.  [name] taking over-the-counter Tylenol as needed for pain.  She also notes that she has pain involving bilateral lower extremities and is undergone several lower extremity surgeries including tendon lengthening and a bony procedure which mom had difficulty describing.  [name] being seen by rheumatology in the near future for work-up of bilateral upper and  lower extremity pains. ? ? ?Review of Systems ? ? ?Objective: ?Vital Signs: There were no vitals taken for this visit. ? ?Physical Exam ?Constitutional:   ?   Appearance: Normal appearance.  ?Cardiovascular:  ?   Rate and Rhythm: Normal rate.  ?   Pulses: Normal pulses.  ?Pulmonary:  ?   Effort: Pulmonary effort is normal.  ?Skin: ?   General: Skin is warm and dry.  ?   Capillary Refill: Capillary refill takes less than 2 seconds.  ?Neurological:  ?   Mental Status: [name] alert.  ? ? ?Right Hand Exam  ? ?Range of Motion  ?The patient has normal right wrist ROM.  ? ?Other  ?Erythema: absent ?Sensation: normal ?Pulse: present ? ?Comments:  Tender to palpation along multiple areas along the extremity from the lateral condyle into the wrist.  No pain with range of motion of the elbow wrist or forearm.  No obvious swelling.  No erythema.  Able to make a full and complete fist bilaterally. ? ? ?Left Hand Exam  ? ?Range of Motion  ?The patient has normal left wrist ROM. ? ?Other  ?Erythema: absent ?Sensation: normal ?Pulse: present ? ?Comments:  Tender to palpation along multiple areas along the extremity from the lateral condyle into the wrist.  No pain with range of motion of the elbow wrist or forearm.  No  obvious swelling.  No erythema.  Able to make a full and complete fist bilaterally. ? ? ? ? ?Specialty Comments:  ?No specialty comments available. ? ?Imaging: ?No results found. ? ? ?PMFS History: ?Patient Active Problem List  ? Diagnosis Date Noted  ? Bilateral arm pain 06/12/2021  ? DUB (dysfunctional uterine bleeding) 05/14/2021  ? Diarrhea 04/24/2021  ? Dysuria 04/24/2021  ? Sinusitis 04/24/2021  ? 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 Other adverse food reactions, not elsewhere classified, subsequent encounter 08/20/2020  ? Other allergic rhinitis 08/20/2020  ? Allergic conjunctivitis of both eyes 08/20/2020  ? Encounter for general adult medical examination with abnormal findings 07/03/2020  ? Leg length  discrepancy 06/05/2020  ? Hidradenitis suppurativa 06/05/2020  ? Intellectual disability 06/05/2020  ? Achilles tendon contracture, bilateral   ? Elevated sedimentation rate 02/07/2020  ? Pain in right ankle and joints of right foot 02/07/2020  ? Anemia 12/29/2018  ? Type 2 diabetes mellitus ([place]) 08/15/2017  ? Elevated triglycerides with high cholesterol 12/09/2016  ? Partial epilepsy with impairment of consciousness ([place]) 08/16/2012  ? Mild intellectual disability 08/16/2012  ? Laxity of ligament 08/16/2012  ? Delayed milestones 08/16/2012  ? Obesity 06/07/2011  ? Dyspepsia 02/11/2011  ? Acanthosis nigricans 02/11/2011  ? Global developmental delay   ? Petit mal without grand mal seizures ([place])   ? Dyspraxia   ? ?Past Medical History:  ?Diagnosis Date  ? Allergy   ? Complication of anesthesia   ? slow to wake up from anesthesia, occ HA with anesthesia   ? Diabetes mellitus without complication ([place])   ? type 2  ? Difficulty swallowing pills   ? Dyspraxia   ? Eczema   ? Global developmental delay   ? Obesity (BMI 30-39.9)   ? PONV (postoperative nausea and vomiting)   ? Precocious female puberty   ? Seizures ([place])   ? last seizure 2012 - no current med  ? White-Sutton syndrome   ?  ?Family History  ?Problem Relation Age of Onset  ? Diabetes Mother   ?     steroid induced; hx. colitis  ? Anesthesia problems Mother   ?     severe headache lasting 2-3 days  ? Rheum arthritis Mother   ? Ulcerative colitis Mother   ? Fibromyalgia Mother   ? Hypertension Maternal Grandmother   ? Hyperlipidemia Maternal Grandmother   ? Diabetes Maternal Grandmother   ? Kidney disease Maternal Grandfather   ? Hypertension Maternal Grandfather   ? Heart disease Maternal Grandfather   ? Hyperlipidemia Maternal Grandfather   ? Kidney disease Paternal Grandmother   ? Hypertension Paternal Grandmother   ? Diabetes Paternal Grandmother   ? Heart disease Paternal Grandmother   ? Hyperlipidemia Paternal Grandmother   ? Hypertension Paternal  Grandfather   ? Hyperlipidemia Paternal Grandfather   ? Henoch-Schonlein purpura Brother   ?     in remission  ? Asthma Maternal Aunt   ? Seizures Maternal Uncle   ?  ?Past Surgical History:  ?Procedure Laterality Date  ? ADENOIDECTOMY    ? bone removal Left 01/16/2021  ? foot  ? GASTROCNEMIUS RECESSION Bilateral 04/25/2020  ? Procedure: BILATERAL GASTROCNEMIUS RECESSION;  Surgeon: Newt Minion, MD;  Location: [facility];  Service: Orthopedics;  Laterality: Bilateral;  ? [place] IMPLANT  05/06/2011  ? Procedure: SUPPRELIN IMPLANT;  Surgeon: Jerilynn Mages. Gerald Stabs, MD;  Location: [facility];  Service: Pediatrics;  Laterality: Right;  ? SUPPRELIN IMPLANT Right 06/14/2013  ?  Procedure: REMOVAL OF SUPPRELIN IMPLANT FROM RIGHT UPPER ARM;  Surgeon: Jerilynn Mages. Gerald Stabs, MD;  Location: [facility];  Service: Pediatrics;  Laterality: Right;  ? TONSILLECTOMY    ? TONSILLECTOMY AND ADENOIDECTOMY    ? TYMPANOSTOMY TUBE PLACEMENT    ? x 2  ? ?Social History  ? ?Occupational History  ? Occupation: [employer]  ?Tobacco Use  ? Smoking status: Never  ?  Passive exposure: Never  ? Smokeless tobacco: Never  ?Vaping Use  ? Vaping Use: Never used  ?Substance and Sexual Activity  ? Alcohol use: No  ?  Alcohol/week: 0.0 standard drinks  ? Drug use: No  ? Sexual activity: Never  ?  Birth control/protection: None  ? ? ? ? ? ? ?

## 2021-06-18 ENCOUNTER — Ambulatory Visit: Payer: 59 | Admitting: Family

## 2021-06-18 ENCOUNTER — Encounter: Payer: Self-pay | Admitting: Podiatry

## 2021-06-23 NOTE — Telephone Encounter (Signed)
Christan or Aaron Edelman, can you please assist this patient? Thanks.  ?

## 2021-07-02 NOTE — Progress Notes (Signed)
Office Visit Note  Patient: Deanna Levine             Date of Birth: 2000-12-09           MRN: [id number]             PCP: [name], MD Referring: [name], * Visit Date: 07/07/2021   Subjective:   History of Present Illness: Deanna Levine is a 21 y.o. female here for follow up with multiple joint pains and elevated inflammatory markers. She still has some pain in the right foot and ankle worst with prolonged standing. For the past 2-3 months she now has pain and numbness affecting both arms. She sometimes has pain in her whole arm. Often gets some finger pain and stiffness disrupting her sleep. Numbness lasting all day sometimes. She was found to have seizure activity on EEG study and is seeing neurology for ongoing management, now with Dr. Delice Lesch.   Previous HPI 02/07/2020 Deanna Levine is a 21 y.o. female with a history of epilepsy, developmental delay, hidradenitis suppurativa, and leg length discrepancy here for evaluation of elevated ESR and CRP with history of right leg and foot pain. This pain has been worsening over the past few months. [name] standing for about 8 hours or more most days for work and pain is increased with use throughout the day and maximal at night afterwards. There is some lower extremity swelling worsening during the day. She has started using a shoe lift for leg length discrepancy but continues to have worse pain in the left ankle. She denies any past injuries or surgeries to this site. She does have hidradenitis suppurativa treated intermittently with antibiotics and topical Hibiclens currently controlled. Her mother notes she may have worsened pain after eating large amounts of gluten and starches but does not have associated digestive complaints.. She denies eye inflammation or redness. She has no oral ulcers, lymphadenopathy, diarrhea or blood in stools.   Labs reviewed 11/2019 ESR 29 CRP 20.8   09/2019 RF negative CCP  negative ANA negative Vitamin D 26 Uric acid 6.4   Review of Systems  Constitutional:  Negative for fatigue.  HENT:  Negative for mouth sores, mouth dryness and nose dryness.   Eyes:  Positive for itching and dryness.  Respiratory:  Positive for shortness of breath. Negative for difficulty breathing.   Cardiovascular:  Negative for chest pain and palpitations.  Gastrointestinal:  Negative for blood in stool, constipation and diarrhea.  Endocrine: Negative for increased urination.  Genitourinary:  Negative for difficulty urinating.  Musculoskeletal:  Positive for joint pain, joint pain, joint swelling, myalgias, morning stiffness, muscle tenderness and myalgias.  Skin:  Positive for rash. Negative for color change.  Allergic/Immunologic: Negative for susceptible to infections.  Neurological:  Positive for numbness and headaches. Negative for dizziness and weakness.  Hematological:  Positive for bruising/bleeding tendency.  Psychiatric/Behavioral:  Positive for sleep disturbance.     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   Other adverse food reactions, not elsewhere classified, subsequent encounter 08/20/2020   Other allergic rhinitis 08/20/2020   Allergic conjunctivitis of both eyes 08/20/2020   Encounter for general adult medical examination with abnormal findings 07/03/2020   Leg length discrepancy 06/05/2020   Hidradenitis suppurativa 06/05/2020  Intellectual disability 06/05/2020   Achilles tendon contracture, bilateral    Elevated sedimentation rate 02/07/2020   Pain in right ankle and joints of right foot 02/07/2020   Anemia 12/29/2018   Type 2 diabetes mellitus ([place]) 08/15/2017   Elevated triglycerides with high cholesterol  12/09/2016   Partial epilepsy with impairment of consciousness ([place]) 08/16/2012   Mild intellectual disability 08/16/2012   Laxity of ligament 08/16/2012   Delayed milestones 08/16/2012   Obesity 06/07/2011   Dyspepsia 02/11/2011   Acanthosis nigricans 02/11/2011   Global developmental delay    Petit mal without grand mal seizures ([name])    Dyspraxia     Past Medical History:  Diagnosis Date   Allergy    Colitis    Complication of anesthesia    slow to wake up from anesthesia, occ HA with anesthesia    Diabetes mellitus without complication ([place])    type 2   Difficulty swallowing pills    Dyspraxia    Eczema    Global developmental delay    Obesity (BMI 30-39.9)    PONV (postoperative nausea and vomiting)    Precocious female puberty    Seizures ([place])    last seizure 2012 - no current med   Sleep apnea    White-Sutton syndrome     Family History  Problem Relation Age of Onset   Diabetes Mother        steroid induced; hx. colitis   Anesthesia problems Mother        severe headache lasting 2-3 days   Rheum arthritis Mother    Ulcerative colitis Mother    Fibromyalgia Mother    Hypertension Maternal Grandmother    Hyperlipidemia Maternal Grandmother    Diabetes Maternal Grandmother    Kidney disease Maternal Grandfather    Hypertension Maternal Grandfather    Heart disease Maternal Grandfather    Hyperlipidemia Maternal Grandfather    Kidney disease Paternal Grandmother    Hypertension Paternal Grandmother    Diabetes Paternal Grandmother    Heart disease Paternal Grandmother    Hyperlipidemia Paternal Grandmother    Hypertension Paternal Grandfather    Hyperlipidemia Paternal Grandfather    Henoch-Schonlein purpura Brother        in remission   Asthma Maternal Aunt    Seizures Maternal Uncle    Past Surgical History:  Procedure Laterality Date   ADENOIDECTOMY     BIOPSY  07/16/2021   Procedure: BIOPSY;  Surgeon: Thornton Park, MD;  Location: [facility];  Service: Gastroenterology;;   bone removal Left 01/16/2021   foot   COLONOSCOPY WITH PROPOFOL N/A 07/16/2021   Procedure: COLONOSCOPY WITH PROPOFOL;  Surgeon: Thornton Park, MD;  Location: [facility];  Service: Gastroenterology;  Laterality: N/A;   GASTROCNEMIUS RECESSION Bilateral 04/25/2020   Procedure: BILATERAL GASTROCNEMIUS RECESSION;  Surgeon: Newt Minion, MD;  Location: [facility];  Service: Orthopedics;  Laterality: Bilateral;   SUPPRELIN IMPLANT  05/06/2011   Procedure: SUPPRELIN IMPLANT;  Surgeon: Jerilynn Mages. Gerald Stabs, MD;  Location: [facility];  Service: Pediatrics;  Laterality: Right;   SUPPRELIN IMPLANT Right 06/14/2013   Procedure: REMOVAL OF SUPPRELIN IMPLANT FROM RIGHT UPPER ARM;  Surgeon: Jerilynn Mages. Gerald Stabs, MD;  Location: [facility];  Service: Pediatrics;  Laterality: Right;   TONSILLECTOMY     TONSILLECTOMY AND ADENOIDECTOMY     TYMPANOSTOMY TUBE PLACEMENT     x 2   Social History   Social History Narrative   Ashanti is graduated. [name] doing  average.    She enjoys playing softball and basketball.   Lives with her parents.    Not working.   Left handed   Two story home      Had surgery on her legs in Feb and is physical therapy twice a week.   Left handed   No caffeine   Immunization History  Administered Date(s) Administered   Influenza,inj,Quad PF,6+ Mos 12/18/2018   PFIZER(Purple Top)SARS-COV-2 Vaccination 05/15/2019, 06/07/2019     Objective: Vital Signs: BP 113/77 (BP Location: Right Arm, Patient Position: Sitting, Cuff Size: Large)   Pulse 92   Ht _0  (1.626 m)   Wt 219 lb 9.6 oz (99.6 kg)   BMI 37.69 kg/m    Physical Exam Constitutional:      Appearance: [name] obese.  Musculoskeletal:     Right lower leg: No edema.     Left lower leg: No edema.  Skin:    General: Skin is warm and dry.     Findings: No rash.  Neurological:     Mental Status: [name] alert.  Psychiatric:        Mood and  Affect: Mood normal.      Musculoskeletal Exam:  Shoulders full ROM no tenderness or swelling Elbows full ROM no tenderness or swelling Wrists full ROM no tenderness or swelling Fingers full ROM tenderness with full flexion no palpable synovitis Limited ultrasound inspection of hands negative for effusion or color doppler enhancement Knees full ROM no tenderness or swelling Right ankle tenderness posteriorly and lateral, no appreciable swelling   Investigation: No additional findings.  Imaging: CT ENTERO ABD/PELVIS W CONTAST  Result Date: 08/07/2021 CLINICAL DATA:  Diarrhea and recent colonoscopy with colitis. EXAM: CT ABDOMEN AND PELVIS WITH CONTRAST (ENTEROGRAPHY) TECHNIQUE: Multidetector CT of the abdomen and pelvis during bolus administration of intravenous contrast. Negative oral contrast was given. RADIATION DOSE REDUCTION: This exam was performed according to the departmental dose-optimization program which includes automated exposure control, adjustment of the mA and/or kV according to patient size and/or use of iterative reconstruction technique. CONTRAST:  172m OMNIPAQUE IOHEXOL 300 MG/ML  SOLN COMPARISON:  None Available. FINDINGS: Lower chest:  No acute abnormality. Hepatobiliary: Hypodensity along the falciform ligament commonly reflects hepatic steatosis or differential perfusion (veins of [place]). Gallbladder is unremarkable. No biliary ductal dilation. Pancreas: No pancreatic ductal dilation or evidence of acute inflammation. Spleen: No splenomegaly or focal splenic lesion. Adrenals/Urinary Tract: Bilateral adrenal glands appear normal. No hydronephrosis. Kidneys demonstrate symmetric enhancement. Urinary bladder is unremarkable for degree of distension. Stomach/Bowel: Stomach is distended by ingested material with wall thickening versus focal peristalsis at the pylorus/gastric antrum. No pathologic dilation of small or large bowel. Appendix is not confidently identified however  there is no pericecal inflammation. Terminal ileum appears normal. No evidence of acute bowel inflammation. Colonic diverticulosis without findings of acute diverticulitis. Vascular/Lymphatic: Normal caliber abdominal aorta. No pathologically enlarged abdominal or pelvic lymph nodes. Reproductive: No mass or other significant abnormality. Other: Significant abdominopelvic free fluid.  No pneumoperitoneum. Musculoskeletal: No suspicious bone lesions identified. IMPRESSION: 1. No convincing evidence of acute bowel inflammation on this slightly motion degraded examination. 2.  Colonic diverticulosis without findings of acute diverticulitis. 3. Stomach is distended by ingested material with wall thickening versus focal peristalsis at the pylorus/gastric antrum, consider correlation for gastric hypomotility or outlet obstruction. Electronically Signed   By: JDahlia BailiffM.D.   On: 08/07/2021 11:04   DG Foot Complete Right  Result Date: 07/29/2021 Please see detailed  radiograph report in office note.   Recent Labs: Lab Results  Component Value Date   WBC 8.6 02/12/2021   HGB 11.2 (L) 02/12/2021   PLT 409 (H) 02/12/2021   NA 138 02/12/2021   K 4.1 02/12/2021   CL 102 02/12/2021   CO2 22 02/12/2021   GLUCOSE 111 (H) 02/12/2021   BUN 14 02/12/2021   CREATININE 0.70 08/06/2021   BILITOT 0.5 02/12/2021   ALKPHOS 116 (H) 03/05/2019   AST 15 02/12/2021   ALT 15 02/12/2021   PROT 8.4 (H) 02/12/2021   ALBUMIN 4.4 03/05/2019   CALCIUM 10.7 (H) 02/12/2021   GFRAA 139 03/05/2019     Procedures:  No procedures performed Allergies: Patient has no known allergies.   Assessment / Plan:     Visit Diagnoses: Bilateral arm pain - Plan: Sedimentation rate, C-reactive protein, Rheumatoid factor, Ambulatory referral to Occupational Therapy  Bilateral hand problems with pain and decreased grip strength, no appreciable synovitis on exam today. Doing worse for 2-3 months persistently. Checking RF and  inflammatory markers today. Will refer to OT for evaluation and treatment with functional deficit.  Pain in right ankle and joints of right foot - describes ankle pain sounding more degenerative or overuse related than inflammatory with consistent worsening during the day when load bearing.  - Plan: Sedimentation rate, C-reactive protein, Rheumatoid factor  Hydradenitis - HA is associated with increased risk of spondyloarthritis but she denies extraarticular features and ultrasound exam was not indicative of active enthesitis.   Skin disease some active but not in exacerbation at this time.  Elevated sedimentation rate   Orders: Orders Placed This Encounter  Procedures   Sedimentation rate   C-reactive protein   Rheumatoid factor   Ambulatory referral to Occupational Therapy   No orders of the defined types were placed in this encounter.    Follow-Up Instructions: Return in about 4 weeks (around 08/04/2021) for Arthralgias ESr/CRP f/u 2mo   CCollier Salina MD  Note - This record has been created using [employer]  Chart creation errors have been sought, but may not always  have been located. Such creation errors do not reflect on  the standard of medical care.

## 2021-07-07 ENCOUNTER — Ambulatory Visit (INDEPENDENT_AMBULATORY_CARE_PROVIDER_SITE_OTHER): Payer: 59 | Admitting: Internal Medicine

## 2021-07-07 ENCOUNTER — Encounter: Payer: Self-pay | Admitting: Internal Medicine

## 2021-07-07 VITALS — BP 113/77 | HR 92 | Ht 64.0 in | Wt 219.6 lb

## 2021-07-07 DIAGNOSIS — M79601 Pain in right arm: Secondary | ICD-10-CM

## 2021-07-07 DIAGNOSIS — R700 Elevated erythrocyte sedimentation rate: Secondary | ICD-10-CM

## 2021-07-07 DIAGNOSIS — L732 Hidradenitis suppurativa: Secondary | ICD-10-CM | POA: Diagnosis not present

## 2021-07-07 DIAGNOSIS — M79602 Pain in left arm: Secondary | ICD-10-CM

## 2021-07-07 DIAGNOSIS — M25571 Pain in right ankle and joints of right foot: Secondary | ICD-10-CM

## 2021-07-08 LAB — RHEUMATOID FACTOR: Rheumatoid fact SerPl-aCnc: <14 IU/mL (ref <14)

## 2021-07-08 LAB — SEDIMENTATION RATE: Sed Rate: 33 mm/h — ABNORMAL HIGH (ref 0–20)

## 2021-07-08 LAB — C-REACTIVE PROTEIN: CRP: 28.7 mg/L — ABNORMAL HIGH (ref <8.0)

## 2021-07-09 ENCOUNTER — Encounter (HOSPITAL_COMMUNITY): Payer: Self-pay | Admitting: Gastroenterology

## 2021-07-09 NOTE — Progress Notes (Signed)
Attempted to obtain medical history via telephone, unable to reach at this time. I left a voicemail to return pre surgical testing department's phone call.  

## 2021-07-10 ENCOUNTER — Ambulatory Visit: Payer: 59 | Admitting: Internal Medicine

## 2021-07-13 ENCOUNTER — Encounter: Payer: Self-pay | Admitting: Podiatry

## 2021-07-13 NOTE — Telephone Encounter (Signed)
Please advise/schedule for an appointment,right foot

## 2021-07-14 ENCOUNTER — Ambulatory Visit (INDEPENDENT_AMBULATORY_CARE_PROVIDER_SITE_OTHER): Payer: 59 | Admitting: Family

## 2021-07-14 ENCOUNTER — Encounter: Payer: Self-pay | Admitting: Internal Medicine

## 2021-07-14 ENCOUNTER — Encounter: Payer: Self-pay | Admitting: Family

## 2021-07-14 ENCOUNTER — Ambulatory Visit: Payer: 59

## 2021-07-14 VITALS — BP 118/73 | HR 95 | Ht 64.57 in | Wt 218.6 lb

## 2021-07-14 DIAGNOSIS — N920 Excessive and frequent menstruation with regular cycle: Secondary | ICD-10-CM | POA: Diagnosis not present

## 2021-07-14 DIAGNOSIS — Z975 Presence of (intrauterine) contraceptive device: Secondary | ICD-10-CM

## 2021-07-14 DIAGNOSIS — N898 Other specified noninflammatory disorders of vagina: Secondary | ICD-10-CM | POA: Diagnosis not present

## 2021-07-14 DIAGNOSIS — Z09 Encounter for follow-up examination after completed treatment for conditions other than malignant neoplasm: Secondary | ICD-10-CM

## 2021-07-14 DIAGNOSIS — N921 Excessive and frequent menstruation with irregular cycle: Secondary | ICD-10-CM

## 2021-07-14 NOTE — Telephone Encounter (Signed)
Patient scheduled for 5/22

## 2021-07-14 NOTE — Progress Notes (Signed)
History was provided by the patient. ? ?Deanna Levine is a 21 y.o. female who is here for vaginal lesions.  ? ?PCP confirmed? Yes.   ? [name], MD ? ?Plan from last visit:  ?1. Menorrhagia with regular cycle ?2. Nexplanon in place ?3. White-Sutton syndrome ?  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 Site is well-healed and implant is palpable in correct position. She has experience menstrual suppression with implant with no cramping or spotting. She would like to return in 3 months for follow-up. We reviewed her labs from last visit which were normal.  ?DHEAS, testosterone, TSH, free T4, prolactin, FSH/LH, VWB panel all normal. PT, PTT and hemoglobinopathy evaluation show still active. Reassuring no bleeding with implant.  ?  ? ?HPI:   ?In Feb she was on her period for 30 days; Dr [name] gave her pills for breakthrough bleeding  ?Last dose used was last week and it worked  ?Some cramping  ? ?Hurts with urination; since having bumps last week - on outside  ? ?Has been using neosporin ?Never sexually active  ? ? ?Patient Active Problem List  ? Diagnosis Date Noted  ? Bilateral arm pain 06/12/2021  ? DUB (dysfunctional uterine bleeding) 05/14/2021  ? Diarrhea 04/24/2021  ? Dysuria 04/24/2021  ? Sinusitis 04/24/2021  ? 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 Other adverse food reactions, not elsewhere classified, subsequent encounter 08/20/2020  ? Other allergic rhinitis 08/20/2020  ? Allergic conjunctivitis of both eyes 08/20/2020  ? Encounter for general adult medical examination with abnormal findings 07/03/2020  ? Leg length discrepancy 06/05/2020  ? Hidradenitis suppurativa 06/05/2020  ? Intellectual disability 06/05/2020  ? Achilles tendon contracture,  bilateral   ? Elevated sedimentation rate 02/07/2020  ? Pain in right ankle and joints of right foot 02/07/2020  ? Anemia 12/29/2018  ? Type 2 diabetes mellitus ([place]) 08/15/2017  ? Elevated triglycerides with high cholesterol 12/09/2016  ? Partial epilepsy with impairment of consciousness ([place]) 08/16/2012  ? Mild intellectual disability 08/16/2012  ? Laxity of ligament 08/16/2012  ? Delayed milestones 08/16/2012  ? Obesity 06/07/2011  ? Dyspepsia 02/11/2011  ? Acanthosis nigricans 02/11/2011  ? Global developmental delay   ? Petit mal without grand mal seizures ([place])   ? Dyspraxia   ? ? ?Current Outpatient Medications on File Prior to Visit  ?Medication Sig Dispense Refill  ? azelastine (ASTELIN) 0.1 % nasal spray Place 1 spray into both nostrils 2 (two) times daily. Use in each nostril as directed 30 mL 12  ? cetirizine (ZYRTEC) 10 MG tablet Take 10 mg by mouth as needed.    ? chlorhexidine (HIBICLENS) 4 % external liquid Apply topically daily as needed. 120 mL 0  ? cholecalciferol (VITAMIN D3) 25 MCG (1000 UNIT) tablet Take 1,000 Units by mouth daily.    ? Continuous Blood Gluc Receiver ([place]) [place] 1 each by Does not apply route as directed. Use with G7 sensor to check blood sugars 1 each 1  ? Continuous Blood Gluc Sensor (DEXCOM G7 SENSOR) MISC Inject 1 Device into the skin as directed. Change sensor every 10 days. 3 each 5  ? dicyclomine (BENTYL) 20 MG tablet Take a half tablet 4 times daily before meals. 60 tablet 3  ? etonogestrel (NEXPLANON) 68 MG IMPL implant  1 each (68 mg total) by Subdermal route once. 1 each 0  ? fluticasone (FLONASE) 50 MCG/ACT nasal spray SHAKE LIQUID AND USE 2 SPRAYS IN EACH NOSTRIL DAILY 16 g 6  ? meloxicam (MOBIC) 15 MG tablet Take 15 mg by mouth daily.    ? Menaquinone-7 (VITAMIN K2) 100 MCG CAPS Take 100 mcg by mouth daily.    ? Multiple Vitamins-Minerals (MULTIVITAMIN ADULT) CHEW Chew 1 tablet by mouth daily.    ? Na Sulfate-K Sulfate-Mg Sulf 17.5-3.13-1.6 GM/177ML  SOLN See admin instructions.    ? norethindrone (AYGESTIN) 5 MG tablet Take 1 tablet (5 mg total) by mouth 2 (two) times daily as needed (bleeding). For spotting take 1 tab daily. If flow take 2 tabs daily. If heavy bleeding take 3 tabs daily. When bleeding has stopped you can stop the medication. 180 tablet 3  ? olopatadine (PATANOL) 0.1 % ophthalmic solution Place 1 drop into both eyes 2 (two) times daily as needed for allergies.    ? spironolactone (ALDACTONE) 50 MG tablet Take 50 mg by mouth daily.    ? tirzepatide (MOUNJARO) 10 MG/0.5ML Pen Inject 10 mg into the skin once a week. 2 mL 2  ? ?No current facility-administered medications on file prior to visit.  ? ? ?No Known Allergies ? ?Physical Exam:  ?  ?Vitals:  ? 07/14/21 0942  ?BP: 118/73  ?Pulse: 95  ?Weight: 218 lb 9.6 oz (99.2 kg)  ?Height: 5' 4.57" (1.64 m)  ? ?Wt Readings from Last 3 Encounters:  ?07/14/21 218 lb 9.6 oz (99.2 kg)  ?07/07/21 219 lb 9.6 oz (99.6 kg)  ?06/02/21 220 lb (99.8 kg)  ?  ? ?Growth percentile SmartLinks can only be used for patients less than 32 years old. ?No LMP recorded. Patient has had an implant. ? ?Physical Exam ?Exam conducted with a chaperone present.  ?Constitutional:   ?   General: [name] not in acute distress. ?   Appearance: [name] well-developed.  ?HENT:  ?   Head: Normocephalic and atraumatic.  ?Eyes:  ?   General: No scleral icterus. ?   Pupils: Pupils are equal, round, and reactive to light.  ?Neck:  ?   Thyroid: No thyromegaly.  ?Cardiovascular:  ?   Rate and Rhythm: Normal rate and regular rhythm.  ?   Heart sounds: Normal heart sounds. No murmur heard. ?Pulmonary:  ?   Effort: Pulmonary effort is normal.  ?   Breath sounds: Normal breath sounds.  ?Abdominal:  ?   Palpations: Abdomen is soft.  ?Genitourinary: ? ? ?Musculoskeletal:     ?   General: Normal range of motion.  ?   Cervical back: Normal range of motion and neck supple.  ?Lymphadenopathy:  ?   Cervical: No cervical adenopathy.  ?Skin: ?   General:  Skin is warm and dry.  ?   Findings: No rash.  ?Neurological:  ?   Mental Status: [name] alert and oriented to person, place, and time.  ?   Cranial Nerves: No cranial nerve deficit.  ?Psychiatric:     ?   Behavior: Behavior normal.     ?   Thought Content: Thought content normal.     ?   Judgment: Judgment normal.  ?  ? ?Assessment/Plan: ? ?2 small lesions, swabbed for culture; ddx include HSV, folliculitis. Continue with Aygestin for BTB.  ? ?1. Vaginal lesion ?2. Menorrhagia with regular cycle ?3. Breakthrough bleeding with Nexplanon ? ? ?

## 2021-07-14 NOTE — Progress Notes (Signed)
CASE MANAGEMENT VISIT  Session Start time: 10:10am  Session End time: 10:20am Total time:  10  minutes  Type of Service:CASE MANAGEMENT Interpretor:No. Interpretor Name and Language: NA  Summary of Today's Visit: Met with patient to follow up on status of applications for SSI and Medicaid. Per patient report, SSI application status is at 50% and it'll be 3-4 more months before it's complete/decision is made. Medicaid application is pended until SSI decision is made.  She has on other needs or questions at this time. Will plan to follow up with her at next check-in with [place], [place].  Plan for Next Visit: As needed.   [place] Coordinator

## 2021-07-14 NOTE — Telephone Encounter (Signed)
Left message on voicemail for patient to call back to schedule appointment.

## 2021-07-16 ENCOUNTER — Ambulatory Visit (HOSPITAL_COMMUNITY): Payer: 59 | Admitting: Certified Registered Nurse Anesthetist

## 2021-07-16 ENCOUNTER — Ambulatory Visit (HOSPITAL_COMMUNITY)
Admission: RE | Admit: 2021-07-16 | Discharge: 2021-07-16 | Disposition: A | Discharge status: Home / Self Care | Payer: 59 | Attending: Gastroenterology | Admitting: Gastroenterology

## 2021-07-16 ENCOUNTER — Ambulatory Visit (HOSPITAL_BASED_OUTPATIENT_CLINIC_OR_DEPARTMENT_OTHER): Payer: 59 | Admitting: Certified Registered Nurse Anesthetist

## 2021-07-16 ENCOUNTER — Other Ambulatory Visit: Payer: Self-pay

## 2021-07-16 ENCOUNTER — Encounter (HOSPITAL_COMMUNITY)
Admission: RE | Disposition: A | Discharge status: Home / Self Care | Payer: Self-pay | Source: Home / Self Care | Attending: Gastroenterology

## 2021-07-16 ENCOUNTER — Encounter (HOSPITAL_COMMUNITY): Payer: Self-pay | Admitting: Gastroenterology

## 2021-07-16 DIAGNOSIS — K6389 Other specified diseases of intestine: Secondary | ICD-10-CM

## 2021-07-16 DIAGNOSIS — Z8371 Family history of colonic polyps: Secondary | ICD-10-CM | POA: Diagnosis not present

## 2021-07-16 DIAGNOSIS — R1030 Lower abdominal pain, unspecified: Secondary | ICD-10-CM | POA: Diagnosis not present

## 2021-07-16 DIAGNOSIS — K529 Noninfective gastroenteritis and colitis, unspecified: Secondary | ICD-10-CM

## 2021-07-16 DIAGNOSIS — G4730 Sleep apnea, unspecified: Secondary | ICD-10-CM

## 2021-07-16 DIAGNOSIS — E119 Type 2 diabetes mellitus without complications: Secondary | ICD-10-CM | POA: Diagnosis not present

## 2021-07-16 DIAGNOSIS — D75839 Thrombocytosis, unspecified: Secondary | ICD-10-CM | POA: Diagnosis not present

## 2021-07-16 DIAGNOSIS — Z8379 Family history of other diseases of the digestive system: Secondary | ICD-10-CM | POA: Insufficient documentation

## 2021-07-16 DIAGNOSIS — D509 Iron deficiency anemia, unspecified: Secondary | ICD-10-CM | POA: Insufficient documentation

## 2021-07-16 DIAGNOSIS — R197 Diarrhea, unspecified: Secondary | ICD-10-CM

## 2021-07-16 DIAGNOSIS — E739 Lactose intolerance, unspecified: Secondary | ICD-10-CM | POA: Insufficient documentation

## 2021-07-16 DIAGNOSIS — K5190 Ulcerative colitis, unspecified, without complications: Secondary | ICD-10-CM | POA: Diagnosis not present

## 2021-07-16 DIAGNOSIS — Z800 Family history of malignant neoplasm of digestive organs: Secondary | ICD-10-CM | POA: Diagnosis not present

## 2021-07-16 HISTORY — DX: Sleep apnea, unspecified: G47.30

## 2021-07-16 HISTORY — PX: COLONOSCOPY WITH PROPOFOL: SHX5780

## 2021-07-16 HISTORY — DX: Noninfective gastroenteritis and colitis, unspecified: K52.9

## 2021-07-16 HISTORY — PX: BIOPSY: SHX5522

## 2021-07-16 LAB — GLUCOSE, CAPILLARY
Glucose-Capillary: 133 mg/dL — ABNORMAL HIGH (ref 70–99)
Glucose-Capillary: 126 mg/dL — ABNORMAL HIGH (ref 70–99)

## 2021-07-16 LAB — POCT PREGNANCY, URINE: Preg Test, Ur: NEGATIVE

## 2021-07-16 SURGERY — COLONOSCOPY WITH PROPOFOL
Anesthesia: Monitor Anesthesia Care

## 2021-07-16 MED ORDER — PROPOFOL 500 MG/50ML IV EMUL
INTRAVENOUS | Status: DC | PRN
  Administered 2021-07-16: 150 ug/kg/min via INTRAVENOUS

## 2021-07-16 MED ORDER — LACTATED RINGERS IV SOLN: INTRAVENOUS | Status: DC

## 2021-07-16 MED ORDER — PROPOFOL 10 MG/ML IV BOLUS
INTRAVENOUS | Status: DC | PRN
  Administered 2021-07-16 (×4): 10 mg via INTRAVENOUS

## 2021-07-16 MED ORDER — LIDOCAINE 2% (20 MG/ML) 5 ML SYRINGE
INTRAMUSCULAR | Status: DC | PRN
Start: 2021-07-16 — End: 2021-07-16
  Administered 2021-07-16: 60 mg via INTRAVENOUS

## 2021-07-16 MED ORDER — SODIUM CHLORIDE 0.9 % IV SOLN: INTRAVENOUS | Status: DC

## 2021-07-16 SURGICAL SUPPLY — 22 items

## 2021-07-16 NOTE — Anesthesia Postprocedure Evaluation (Signed)
Anesthesia Post Note  Patient: Ishana Blades  Procedure(s) Performed: COLONOSCOPY WITH PROPOFOL BIOPSY     Patient location during evaluation: PACU Anesthesia Type: MAC Level of consciousness: awake and alert Pain management: pain level controlled Vital Signs Assessment: post-procedure vital signs reviewed and stable Respiratory status: spontaneous breathing, nonlabored ventilation, respiratory function stable and patient connected to nasal cannula oxygen Cardiovascular status: stable and blood pressure returned to baseline Postop Assessment: no apparent nausea or vomiting Anesthetic complications: no   No notable events documented.  Last Vitals:  Vitals:   07/16/21 1015 07/16/21 1026  BP: 112/70 125/87  Pulse: 99 88  Resp: 15 19  Temp: 36.7 C   SpO2: 100% 100%    Last Pain:  Vitals:   07/16/21 1026  TempSrc:   PainSc: 0-No pain                 Effie Berkshire

## 2021-07-16 NOTE — Anesthesia Procedure Notes (Signed)
Procedure Name: MAC Date/Time: 07/16/2021 9:44 AM Performed by: Janene Harvey, CRNA Pre-anesthesia Checklist: Patient identified, Emergency Drugs available, Suction available and Patient being monitored Patient Re-evaluated:Patient Re-evaluated prior to induction Oxygen Delivery Method: Simple face mask Induction Type: IV induction Placement Confirmation: positive ETCO2 Dental Injury: Teeth and Oropharynx as per pre-operative assessment

## 2021-07-16 NOTE — H&P (Signed)
Referring Provider: No ref. provider found Primary Care Physician:  [name], MD  Indication for procedure:  Diarrhea, Mom with UC   IMPRESSION:  Diarrhea Microcytic anemia Thrombocytosis Elevated CRP Mother under evaluation by Dr. Fuller Plan for IBD  The differential diagnosis of chronic diarrhea with a history of microscopic anemia and elevated CRP includes IBD and celiac disease. However, chronic diarrhea can also be caused by medications, irritable bowel syndrome, chronic infection, food intolerance, microscopic colitis, other functional GI disease. By history, this is less likely to be obstruction.  PLAN: Colonoscopy    HPI: Deanna Levine is a 21 y.o. female who presents for endoscopic evaluation of diarrhea and abdominal cramping. She has concurrent joint pain and her Mom has ulcerative colitis and is followed by Dr. Fuller Plan. The history is obtained through the patient, her father who accompanies her to this appointment and review of her electronic health record. 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  She also has a history of lactose intolerance.   Several months of change in bowel habits Having several BM daily - up to 7 if not more - described as "drippy stools." Has lower abdominal cramping but it's unclear if this is related to the diarrhea.  Some rectal burning.  No blood or mucous in the stool.   Drinks zero-sugar gatorade and diet V8. Eating bananas may have provided relief.   Enjoys playing video games. 2K, [employer], hoping to start a YouTube channel  Mom under evaluation for IBD. Father with colon polyps. Paternal great grandmother with stomach cancer. There is no other known family history of colon cancer or polyps. No family history of stomach cancer or other GI malignancy. No family history of inflammatory bowel disease or celiac.   TSH normal 1/23 CRP 20.8  2021 Hemoglobin 11.2, MCV 67.8, RDW 17.7, platelets 409  No prior abdominal imaging. No prior endoscopic evaluation.    Past Medical History:  Diagnosis Date   Allergy    Complication of anesthesia    slow to wake up from anesthesia, occ HA with anesthesia    Diabetes mellitus without complication (HCC)    type 2   Difficulty swallowing pills    Dyspraxia    Eczema    Global developmental delay    Obesity (BMI 30-39.9)    PONV (postoperative nausea and vomiting)    Precocious female puberty    Seizures ([place])    last seizure 2012 - no current med   Sleep apnea    White-Sutton syndrome     Past Surgical History:  Procedure Laterality Date   ADENOIDECTOMY     bone removal Left 01/16/2021   foot   GASTROCNEMIUS RECESSION Bilateral 04/25/2020   Procedure: BILATERAL GASTROCNEMIUS RECESSION;  Surgeon: Newt Minion, MD;  Location: [place];  Service: Orthopedics;  Laterality: Bilateral;   SUPPRELIN IMPLANT  05/06/2011   Procedure: SUPPRELIN IMPLANT;  Surgeon: Jerilynn Mages. Gerald Stabs, MD;  Location: [place];  Service: Pediatrics;  Laterality: Right;   SUPPRELIN IMPLANT Right 06/14/2013   Procedure: REMOVAL OF SUPPRELIN IMPLANT FROM RIGHT UPPER ARM;  Surgeon: Jerilynn Mages. Gerald Stabs, MD;  Location: [place] [facility];  Service: Pediatrics;  Laterality: Right;   TONSILLECTOMY     TONSILLECTOMY AND ADENOIDECTOMY     TYMPANOSTOMY TUBE PLACEMENT     x 2      No current facility-administered medications for this encounter.  Allergies as of 05/20/2021   (No Known Allergies)    Family History  Problem Relation Age of Onset   Diabetes Mother        steroid induced; hx. colitis   Anesthesia problems Mother        severe headache lasting 2-3 days   Rheum arthritis Mother    Ulcerative colitis Mother    Fibromyalgia Mother    Hypertension Maternal Grandmother    Hyperlipidemia Maternal Grandmother    Diabetes Maternal Grandmother    Kidney disease Maternal  Grandfather    Hypertension Maternal Grandfather    Heart disease Maternal Grandfather    Hyperlipidemia Maternal Grandfather    Kidney disease Paternal Grandmother    Hypertension Paternal Grandmother    Diabetes Paternal Grandmother    Heart disease Paternal Grandmother    Hyperlipidemia Paternal Grandmother    Hypertension Paternal Grandfather    Hyperlipidemia Paternal Grandfather    Henoch-Schonlein purpura Brother        in remission   Asthma Maternal Aunt    Seizures Maternal Uncle       Physical Exam: General:   Alert,  well-nourished, pleasant and cooperative in NAD Head:  Normocephalic and atraumatic. Eyes:  Sclera clear, no icterus.   Conjunctiva pink. Ears:  Normal auditory acuity. Nose:  No deformity, discharge,  or lesions. Mouth:  No deformity or lesions.   Neck:  Supple; no masses or thyromegaly. Lungs:  Clear throughout to auscultation.   No wheezes. Heart:  Regular rate and rhythm; no murmurs. Abdomen:  Soft, nontender, nondistended, normal bowel sounds, no rebound or guarding. No hepatosplenomegaly.   Rectal:  Deferred  Msk:  Symmetrical. No boney deformities LAD: No inguinal or umbilical LAD Extremities:  No clubbing or edema. Neurologic:  Alert and  oriented x4;  grossly nonfocal Skin:  Intact without significant lesions or rashes. Psych:  Alert and cooperative. Normal mood and affect.     Adrielle Polakowski L. Tarri Glenn, MD, MPH 07/16/2021, 8:39 AM

## 2021-07-16 NOTE — Op Note (Addendum)
[facility] Patient Name: Deanna Levine Procedure Date : 07/16/2021 MRN: [id number] Attending MD: Thornton Park MD, MD Date of Birth: 22-Aug-2000 CSN: [id number] Age: 21 Admit Type: Outpatient Procedure:                Colonoscopy Indications:              Chronic diarrhea, Normocytic anemia, Mom is                            currently under evaluation for possible diagnosis                            of IBD Providers:                Thornton Park MD, MD, Burtis Junes, RN, Benetta Spar, Technician Referring MD:              Medicines:                 Complications:            No immediate complications. Estimated Blood Loss:     Estimated blood loss was minimal. Procedure:                Pre-Anesthesia Assessment:                           - Prior to the procedure, a History and Physical                            was performed, and patient medications and                            allergies were reviewed. The patient's tolerance of                            previous anesthesia was also reviewed. The risks                            and benefits of the procedure and the sedation                            options and risks were discussed with the patient.                            All questions were answered, and informed consent                            was obtained. Prior Anticoagulants: The patient has                            taken no previous anticoagulant or antiplatelet                            agents. ASA Grade Assessment: III -  A patient with                            severe systemic disease. After reviewing the risks                            and benefits, the patient was deemed in                            satisfactory condition to undergo the procedure.                           After obtaining informed consent, the colonoscope                            was passed under direct vision. Throughout the                             procedure, the patient's blood pressure, pulse, and                            oxygen saturations were monitored continuously. The                            CF-HQ190L ([id number] Olympus colonoscope was                            introduced through the anus and advanced to the 5                            cm into the ileum. A second forward view of the                            right colon was performed. The colonoscopy was                            performed without difficulty. The patient tolerated                            the procedure well. The quality of the bowel                            preparation was good. The terminal ileum, ileocecal                            valve, appendiceal orifice, and rectum were                            photographed. Scope In: 9:53:35 AM Scope Out: 10:05:30 AM Scope Withdrawal Time: 0 hours 8 minutes 54 seconds  Total Procedure Duration: 0 hours 11 minutes 55 seconds  Findings:      The perianal and digital rectal examinations were normal.      A localized area of mildly erythematous mucosa was found in the cecum.  Biopsies were taken with a cold forceps for histology. Estimated blood       loss was minimal.      The remainder of the examined colonic mucosa appeared normal. Biopsies       were taken from the right colon, transverse colon, and left colon with a       cold forceps for histology. Estimated blood loss was minimal.      The terminal ileum appeared normal. Biopsies were taken with a cold       forceps for histology. Estimated blood loss was minimal.      The exam was otherwise without abnormality on direct and retroflexion       views. Impression:               - Erythematous mucosa in the cecum. This is of                            unclear clinical significance. Biopsied.                           - The entire examined colon is normal. Biopsied.                           - The examined portion of the ileum was  normal.                            Biopsied.                           - The examination was otherwise normal on direct                            and retroflexion views. Recommendation:           - Patient has a contact number available for                            emergencies. The signs and symptoms of potential                            delayed complications were discussed with the                            patient. Return to normal activities tomorrow.                            Written discharge instructions were provided to the                            patient.                           - Resume previous diet.                           - Continue present medications.                           -  Await pathology results. Procedure Code(s):        --- Professional ---                           [phone], Colonoscopy, flexible; with biopsy, single                            or multiple Diagnosis Code(s):        --- Professional ---                           K63.89, Other specified diseases of intestine                           K52.9, Noninfective gastroenteritis and colitis,                            unspecified CPT copyright 2019 American Medical Association. All rights reserved. The codes documented in this report are preliminary and upon coder review may  be revised to meet current compliance requirements. Thornton Park MD, MD 07/16/2021 10:17:39 AM This report has been signed electronically. Number of Addenda: 0

## 2021-07-16 NOTE — Transfer of Care (Signed)
Immediate Anesthesia Transfer of Care Note  Patient: Patryce Depriest  Procedure(s) Performed: COLONOSCOPY WITH PROPOFOL BIOPSY  Patient Location: PACU  Anesthesia Type:MAC  Level of Consciousness: drowsy and patient cooperative  Airway & Oxygen Therapy: Patient Spontanous Breathing and Patient connected to face mask oxygen  Post-op Assessment: Report given to RN and Post -op Vital signs reviewed and stable  Post vital signs: Reviewed and stable  Last Vitals:  Vitals Value Taken Time  BP 112/70 07/16/21 1012  Temp    Pulse 90 07/16/21 1012  Resp    SpO2 100 % 07/16/21 1012  Vitals shown include unvalidated device data.  Last Pain:  Vitals:   07/16/21 0839  TempSrc: Temporal  PainSc: 0-No pain         Complications: No notable events documented.

## 2021-07-16 NOTE — Anesthesia Preprocedure Evaluation (Signed)
Anesthesia Evaluation  Patient identified by MRN, date of birth, ID band Patient awake    Reviewed: Allergy & Precautions, NPO status , Patient's Chart, lab work & pertinent test results  History of Anesthesia Complications (+) PONV and history of anesthetic complications  Airway Mallampati: I  TM Distance: >3 FB Neck ROM: Full    Dental  (+) Teeth Intact, Dental Advisory Given   Pulmonary sleep apnea ,    breath sounds clear to auscultation       Cardiovascular negative cardio ROS   Rhythm:Regular Rate:Normal     Neuro/Psych Seizures -, Well Controlled,  negative psych ROS   GI/Hepatic negative GI ROS, Neg liver ROS,   Endo/Other  diabetes, Type 2  Renal/GU negative Renal ROS     Musculoskeletal negative musculoskeletal ROS (+)   Abdominal Normal abdominal exam  (+)   Peds  Hematology   Anesthesia Other Findings   Reproductive/Obstetrics                             Anesthesia Physical Anesthesia Plan  ASA: 2  Anesthesia Plan: MAC   Post-op Pain Management:    Induction: Intravenous  PONV Risk Score and Plan: 0 and Propofol infusion  Airway Management Planned: Natural Airway and Simple Face Mask  Additional Equipment: None  Intra-op Plan:   Post-operative Plan:   Informed Consent: I have reviewed the patients History and Physical, chart, labs and discussed the procedure including the risks, benefits and alternatives for the proposed anesthesia with the patient or authorized representative who has indicated his/her understanding and acceptance.       Plan Discussed with: CRNA  Anesthesia Plan Comments:         Anesthesia Quick Evaluation

## 2021-07-17 ENCOUNTER — Encounter (HOSPITAL_COMMUNITY): Payer: Self-pay | Admitting: Gastroenterology

## 2021-07-17 LAB — SURGICAL PATHOLOGY

## 2021-07-20 ENCOUNTER — Other Ambulatory Visit: Payer: Self-pay

## 2021-07-20 ENCOUNTER — Ambulatory Visit (INDEPENDENT_AMBULATORY_CARE_PROVIDER_SITE_OTHER): Payer: 59 | Admitting: Podiatry

## 2021-07-20 ENCOUNTER — Ambulatory Visit (INDEPENDENT_AMBULATORY_CARE_PROVIDER_SITE_OTHER): Payer: 59

## 2021-07-20 ENCOUNTER — Other Ambulatory Visit: Payer: 59

## 2021-07-20 DIAGNOSIS — M722 Plantar fascial fibromatosis: Secondary | ICD-10-CM

## 2021-07-20 DIAGNOSIS — R197 Diarrhea, unspecified: Secondary | ICD-10-CM

## 2021-07-20 MED ORDER — METHYLPREDNISOLONE 4 MG PO TBPK: ORAL_TABLET | ORAL | 0 refills | Status: DC

## 2021-07-20 NOTE — Patient Instructions (Signed)

## 2021-07-21 ENCOUNTER — Encounter: Payer: Self-pay | Admitting: Podiatry

## 2021-07-21 NOTE — Progress Notes (Signed)
  Subjective:  Patient ID: Deanna Levine, female    DOB: March 23, 2000,  MRN: [id number]  Chief Complaint  Patient presents with   Foot Pain    Right foot pain, top of foot medial     21 y.o. female returns for new pain in the arch of the right foot.  Left foot seems to be doing okay she says.  The right foot now is hurting in the arch  Review of Systems: Negative except as noted in the HPI. Denies N/V/F/Ch.   Objective:  There were no vitals filed for this visit. There is no height or weight on file to calculate BMI. Constitutional Well developed. Well nourished.  Vascular Foot warm and well perfused. Capillary refill normal to all digits.   Neurologic Normal speech. Oriented to person, place, and time. Epicritic sensation to light touch grossly present bilaterally.  Dermatologic Incisions are well-healed on the left foot  Orthopedic: She has pain to palpation in the medial band of the plantar fascia distally in the arch there are no masses as well as the insertion   Multiple view plain film radiographs: Right foot films taken today show no significant heel spur there is maintained similar alignment, no fracture noted in the area of concern Assessment:   1. Plantar fasciitis, right     Plan:  Patient was evaluated and treated and all questions answered.  Discussed the etiology and treatment options for plantar fasciitis including stretching, formal physical therapy, supportive shoegears such as a running shoe or sneaker, pre fabricated orthoses, injection therapy, and oral medications. We also discussed the role of surgical treatment of this for patients who do not improve after exhausting non-surgical treatment options.   -XR reviewed with patient -Educated patient on stretching and icing of the affected limb -Rx for medrol pack. Educated on use, risks, and benefits of the medication -Currently in physical therapy and I gave her an order to have this worked on as  well  Return in about 6 weeks (around 08/31/2021) for recheck plantar fasciitis.

## 2021-07-22 ENCOUNTER — Encounter: Payer: Self-pay | Admitting: Gastroenterology

## 2021-07-28 ENCOUNTER — Encounter: Payer: Self-pay | Admitting: Internal Medicine

## 2021-07-28 ENCOUNTER — Ambulatory Visit (INDEPENDENT_AMBULATORY_CARE_PROVIDER_SITE_OTHER): Payer: 59 | Admitting: Internal Medicine

## 2021-07-28 ENCOUNTER — Other Ambulatory Visit: Payer: Self-pay

## 2021-07-28 VITALS — BP 106/73 | HR 96 | Temp 98.0°F | Wt 217.5 lb

## 2021-07-28 DIAGNOSIS — K529 Noninfective gastroenteritis and colitis, unspecified: Secondary | ICD-10-CM | POA: Diagnosis not present

## 2021-07-28 NOTE — Patient Instructions (Signed)
follow up GI (has appointment in 1 week) regarding repeat colonsocopy/ct and other diagnostics planned.    She does have family hx crohn's and she also has White-Sutton syndrome which I am not familiar if can contribute to GI syndrome  At this time it doesn't appear this is due to any infectious cause  Follow up with ID as needed

## 2021-07-28 NOTE — Progress Notes (Unsigned)
MEDICAL GENETICS NEW PATIENT EVALUATION  Patient name: Deanna Levine DOB: [birth year] Age: 21 y.o. 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 [name] accompanied by her mother and father at today's visit.  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 This diagnosis is consistent with much of her medical and developmental history, including developmental delays, intellectual disability, seizures, and obesity. Avian graduated from high school in 2020- she was in an occupational course of study. She had an IEP and received therapies in the past. She continues to require supervision with bathing and other personal hygiene to make sure she is thorough. Parents report she has difficulty retaining information and they have to teach/go over things often but she still does not remember. She has trouble with basic math and money management. Parents have concerns that she will be taken advantage of by people claiming to be her friends or asking for money. Deanna Levine and her parents would like for her to be more independent and social. Deanna Levine would like to work and go to college. The family is interested in resources in this regard. They have applied for disability for [employer]. Parents do not have guardianship and are considering pros/cons of this.  Regarding history of seizures- onset was around 21 yo with multiple absence seizures a day. Deanna Levine was on carbamazepine but then around 2015 she was no longer having seizures and EEG was normal, so medication was weaned. In the past couple of years she has had staring spells. Recent EEG in  February 2023 was abnormal with rare generalized 4 Hz spike and wave discharges with frontal predominance seen in sleep consistent with generalized epilepsy. The family was hesistant to start medication again and preferred to monitor/keep a journal, with consideration of lamotrigine if concerns persist. Deanna Levine follows with Dr. Delice Lesch in this regard (next appt 08/18/2021).  Deanna Levine sees multiple other specialists. She followed with Dr. [name] (endocrinology) initially for precocious puberty and now along with Dr. Lovena Le (endocrinology, PharmD) for type 2 diabetes and metabolic syndrome. [name] obese and has sleep apnea. Deanna Levine follows with Dr. Tarri Glenn (GI) for chronic diarrhea and was recently diagnosed with colitis. She follows with Dr. Leonie Green (dermatology) for hidradenitis suppurativa. Deanna Levine follows with Dr. Sherryle Lis for foot pain. Parents note that Deanna Levine had a leg length discrepancy and congenital valgus deformity. She developed bilateral achilles tendon contractures and required bilateral gastrocnemius recessions. She complained of occasional foot pain during high school (was in various sports like softball and basketball) but began experiencing more significant foot pain in 2022 when she started working and was on her feet a lot. 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 They will continue to monitor and consider operation on the right foot or possibly subtalar fusion in the future. The foot pain has greatly limited her ability to sustain physical activity or work at [employer].  Prior genetic testing has been performed. Deanna Levine was evaluated by [facility] geneticist Dr. Abelina Bachelor in 2009- fragile X testing and Prader Willi syndrome methylation testing were normal at that time. 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 In  2022 Deanna Levine was evaluated by [facility] geneticist Dr.  Meryl Dare. Microarray was normal. 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 She met with Dr. Meryl Dare and her team to discuss this diagnosis through telemedicine 10/2020. Dr. Meryl Dare has since retired, so Edin and her family have chosen to switch to genetics follow-up with [name] at [facility].  Pregnancy/Birth History: Deanna Levine was born to a then 21 year old G60P1 -> 2 mother. The pregnancy was complicated by gestational diabetes. There was exposure to metformin, 6-mercaptopurine, and prednisone.  Deanna Levine was born at [redacted] weeks gestation at [place] via repeat c-section delivery due to multiple uterine fibroids. Birth weight 7 lb 6 oz (3.345 kg) (50-75%), birth length 21 in/53.3 cm (>90%), head circumference unknown. She did not require a NICU stay. She did have some jaundice and difficulty gaining weight initially. She was discharged home with mother a few days after birth.   Past Medical History: Past Medical History:  Diagnosis Date   Allergy    Complication of anesthesia    slow to wake up from anesthesia, occ HA with anesthesia    Diabetes mellitus without complication (HCC)    type 2   Difficulty swallowing pills    Dyspraxia    Eczema    Global developmental delay    Obesity (BMI 30-39.9)    PONV (postoperative nausea and vomiting)    Precocious female puberty    Seizures ([place])    last seizure 2012 - no current med   Sleep apnea    White-Sutton syndrome    Patient Active Problem List   Diagnosis Date Noted   Noninfectious gastroenteritis    Bilateral arm pain 06/12/2021   DUB (dysfunctional uterine bleeding) 05/14/2021   Diarrhea 04/24/2021   Dysuria 04/24/2021   Sinusitis 04/24/2021   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   Other adverse food reactions, not elsewhere classified, subsequent encounter 08/20/2020   Other allergic rhinitis 08/20/2020   Allergic conjunctivitis of both eyes  08/20/2020   Encounter for general adult medical examination with abnormal findings 07/03/2020   Leg length discrepancy 06/05/2020   Hidradenitis suppurativa 06/05/2020   Intellectual disability 06/05/2020   Achilles tendon contracture, bilateral    Elevated sedimentation rate 02/07/2020   Pain in right ankle and joints of right foot 02/07/2020   Anemia 12/29/2018   Type 2 diabetes mellitus ([place]) 08/15/2017   Elevated triglycerides with high cholesterol 12/09/2016   Partial epilepsy with impairment of consciousness ([place]) 08/16/2012   Mild intellectual disability 08/16/2012   Laxity of ligament 08/16/2012   Delayed milestones 08/16/2012   Obesity 06/07/2011   Dyspepsia 02/11/2011   Acanthosis nigricans 02/11/2011   Global developmental delay    Petit mal without grand mal seizures ([place])    Dyspraxia     Past Surgical History:  Past Surgical History:  Procedure Laterality Date   ADENOIDECTOMY     BIOPSY  07/16/2021   Procedure: BIOPSY;  Surgeon: Thornton Park, MD;  Location: [place];  Service: Gastroenterology;;   bone removal Left 01/16/2021   foot   COLONOSCOPY WITH PROPOFOL N/A 07/16/2021   Procedure: COLONOSCOPY WITH PROPOFOL;  Surgeon: Thornton Park, MD;  Location: [place];  Service: Gastroenterology;  Laterality: N/A;   GASTROCNEMIUS RECESSION Bilateral 04/25/2020   Procedure: BILATERAL GASTROCNEMIUS RECESSION;  Surgeon: Newt Minion, MD;  Location: [place];  Service: Orthopedics;  Laterality: Bilateral;   SUPPRELIN IMPLANT  05/06/2011   Procedure: SUPPRELIN IMPLANT;  Surgeon: M.  Gerald Stabs, MD;  Location: [place];  Service: Pediatrics;  Laterality: Right;   SUPPRELIN IMPLANT Right 06/14/2013   Procedure: REMOVAL OF SUPPRELIN IMPLANT FROM RIGHT UPPER ARM;  Surgeon: Jerilynn Mages. Gerald Stabs, MD;  Location: [place];  Service: Pediatrics;  Laterality: Right;   TONSILLECTOMY     TONSILLECTOMY AND ADENOIDECTOMY      TYMPANOSTOMY TUBE PLACEMENT     x 2    Developmental History: Milestones -- crawled >1 yo, walked at 21 yo. Global delays.  Therapies -- yes in the past.  Toilet training -- yes, but needs supervision to ensure good hygiene.  School -- Graduated from [employer] in 2020- occupational course of study. Had an IEP. 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  Social History: Social History   Social History Narrative   Shifa is graduated. [name] doing average.    She enjoys playing softball and basketball.   Lives with her parents.    Not working.   Left handed   Two story home      Had surgery on her legs in Feb and is physical therapy twice a week.   Left handed   No caffeine    Medications: Current Outpatient Medications on File Prior to Visit  Medication Sig Dispense Refill   acetaminophen (TYLENOL) 160 MG/5ML elixir Take 960 mg by mouth every 4 (four) hours as needed for fever.     azelastine (ASTELIN) 0.1 % nasal spray Place 1 spray into both nostrils 2 (two) times daily. Use in each nostril as directed (Patient taking differently: Place 1 spray into both nostrils 2 (two) times daily as needed for rhinitis. Use in each nostril as directed) 30 mL 12   cetirizine (ZYRTEC) 10 MG tablet Take 10 mg by mouth daily as needed for allergies.     chlorhexidine (HIBICLENS) 4 % external liquid Apply topically daily as needed. 120 mL 0   cholecalciferol (VITAMIN D3) 25 MCG (1000 UNIT) tablet Take 1,000 Units by mouth daily.     Continuous Blood Gluc Receiver ([place]) [place] 1 each by Does not apply route as directed. Use with G7 sensor to check blood sugars 1 each 1   Continuous Blood Gluc Sensor (DEXCOM G7 SENSOR) MISC Inject 1 Device into the skin as directed. Change sensor every 10 days. 3 each 5   etonogestrel (NEXPLANON) 68 MG IMPL implant 1 each (68 mg total) by Subdermal route once. 1 each 0   fluticasone (FLONASE)  50 MCG/ACT nasal spray SHAKE LIQUID AND USE 2 SPRAYS IN EACH NOSTRIL DAILY 16 g 6   meloxicam (MOBIC) 15 MG tablet Take 15 mg by mouth daily.     Menaquinone-7 (VITAMIN K2) 100 MCG CAPS Take 100 mcg by mouth daily.     Metamucil Fiber CHEW Chew 1 tablet by mouth daily.     methylPREDNISolone (MEDROL DOSEPAK) 4 MG TBPK tablet 6 day dose pack - take as directed 21 tablet 0   Multiple Vitamins-Minerals (MULTIVITAMIN ADULT) CHEW Chew 1 tablet by mouth daily.     Na Sulfate-K Sulfate-Mg Sulf 17.5-3.13-1.6 GM/177ML SOLN See admin instructions.     norethindrone (AYGESTIN) 5 MG tablet Take 1 tablet (5 mg total) by mouth 2 (two) times daily as needed (bleeding). For spotting take 1 tab daily. If flow take 2 tabs daily. If heavy bleeding take 3 tabs daily. When bleeding has stopped you can stop the  medication. 180 tablet 3   olopatadine (PATANOL) 0.1 % ophthalmic solution Place 1 drop into both eyes 2 (two) times daily as needed for allergies.     tirzepatide (MOUNJARO) 10 MG/0.5ML Pen Inject 10 mg into the skin once a week. 2 mL 2   dicyclomine (BENTYL) 20 MG tablet Take a half tablet 4 times daily before meals. (Patient not taking: Reported on 07/28/2021) 60 tablet 3   sulfamethoxazole-trimethoprim (BACTRIM) 200-40 MG/5ML suspension Take 20 mLs by mouth 2 (two) times daily. (Patient not taking: Reported on 07/30/2021)     No current facility-administered medications on file prior to visit.    Allergies:  No Known Allergies  Immunizations: up to date  Review of Systems: General: White Sutton syndrome. Obesity.  Eyes/vision: glasses- farsighted. Eyes crossed when younger. Ears/hearing: tonsils and adenoids removed, tubes placed due to recurrent ear infections as child. Excessive wax in ears. Has not had audiological evaluation. Dental: no concerns. Respiratory: sleep apnea. Cardiovascular: no concerns. Gastrointestinal: chronic diarrhea- recently diagnosed colitis. Genitourinary: no  concerns. Endocrine: h/o precocious puberty. Type 2 diabetes. Metabolic syndrome. Has nexplanon implant- recently start norethindrone due to breakthrough bleeding. Hematologic: no concerns. Immunologic: no concerns. Neurological: Intellectual disability. History of seizures- recent EEG (04/2021) abnormal, considering medications but not currently on. Has had normal brain MRI in the past. Psychiatric: Anxiety. Musculoskeletal: "Limber"/flexible as a child. Leg length discrepancy. H/o bilateral achilles tendon contractures- s/p bilateral gastrocnemius recessions. Bilateral calcaneal tarsal coalitions- s/p surgery on left foot in November 2022. Foot pain. Skin, Hair, Nails: constantly has sores. Hidradentitis supparativa. Sees dermatology.  Family History: Pedigree not obtained this visit.  Family history had previously been collected during Soila's genetics visits with Dr. Abelina Bachelor and Dr. Meryl Dare. These histories are summarized as follows:  Amayiah is one of two children between her parents. She has an older brother (20 yo) who reportedly had a rare blood disease in first grade and then went into remission. He is currently healthy and does not have any developmental/learning concerns. Keneisha's mother is 46 yo and 5'4". She had a history of multiple colon polyps, uterine polyps, Crohn's disease, colitis, fibromyalgia, hidradenitis suppurativa, and a heart murmur. There is a note that the mother may have had genetic testing for cancer genes that was positive- this was not discussed during today's visit. Ladonya's father is 71 yo, 5'2", and has sleep apnea.  Family history is notable for two maternal uncles who died of a heart attack (one at 52 and another at 67). The one that died at 48 had a grandson that has eye concerns, developmental delays, and weakness who was diagnosed at 21 yo with Henoch-Schonlein purpura. He had another grandson (sibling of grandson with HSP) that died at 74 yo who had a feeding  tube and heart problems. The one that died at 21 yo also had a learning disability. Another maternal uncle has a history of seizures. A maternal aunt was diagnosed with breast cancer at 21 yo. There is a history of breast, colon, and brain cancer in the maternal grandmother's family. A paternal uncle had a stutter.  Mother's ethnicity: African American Father's ethnicity: African American/Caucasian Consanguinity: Denies  Physical Examination: Weight: 97.3 kg (99%) Height: not obtained at today's visit; last height on 04/2020 was 162.6 cm (~50%) Head circumference: 68.2 cm (>99.99%) -- large/inaccurate due to hairstyle  Wt 214 lb 9.6 oz (97.3 kg)   HC 68.2 cm (26.85") Comment: patient has big box braids; h/c may be bigger  BMI 36.19 kg/m  General: Alert, initially shy but interactive when prompted by parents Head: Normocephalic, full cheeks Eyes: Mild hypertelorism otherwise normoset, Normal lids, lashes, brows, wearing glasses Nose: Normal appearance Lips/Mouth/Teeth: Normal appearance; central maxillary incisors are rotated inward Ears: Normoset and normally formed, no pits, tags or creases Neck: Normal appearance Heart: Warm and well perfused Lungs: No increased work of breathing Hair: Normal anterior and posterior hairline, normal texture Neurologic: Normal gross motor by observation, no abnormal movements Psych: Gave straightforward and simple answers; could easily answer 8x2=16 but had difficulty with 8x3. Extremities: Symmetric and proportionate Hands/Feet: Tapered fingers; normal fingernails; feet not examined  Photo of patient in media tab (parental verbal consent obtained)  Prior Genetic testing:    Pertinent Labs: None  Pertinent Imaging/Studies: None  Assessment: Bretta Fees is a 21 y.o. female with recently diagnosed White-Sutton syndrome through genetic testing. This diagnosis is consistent with much of her medical and developmental history, including  developmental delays, intellectual disability, seizures, and obesity (and comorbidities such as sleep apnea and type 2 diabetes). She additionally has GI concerns, skin concerns and chronic foot pain that are not necessarily due to this genetic condition from what is currently known.  The majority of today's appointment was spent gathering history and discussing parent's current concerns for [place]. One of their main worries is regarding Tambria's level of independence and memory problems given her trouble with learning/retaining new skills. They are interested in more resources to support adults with disabilities, both in social aspects but also educational opportunities and ways to learn activities of daily living to gain more independence. As such, we will plan to see Synthia again in 1-2 months while we look into available resources in the [place] area.  We did not discuss specifics of White Sutton syndrome beyond that it is a rare condition of which we still have much to learn because it was only discovered in recent years. We encouraged the family to join the [employer] syndrome registry and they are interested in doing so but state it looked overwhelming the amount of information that needed to be input manually. We will work with the family to accomplish this. We will plan to go over Malynda's diagnosis in more detail during her next appointment.  Briefly, Deanna Levine syndrome is caused by pathogenic variants in one copy of the POGZ gene. The variant identified in [place] is new (de novo) in her and was not inherited from either parent. There is a 50% chance she will pass the variant on to any future children. Currently, there are approximately 100 individuals described in the literature with Deanna Levine syndrome (though it is suspected there are more individuals with the condition who have not been described or who have not been diagnosed). As such, long term information regarding this condition is  limited. White Sutton syndrome characterized by a wide spectrum of cognitive dysfunction, developmental delays (particularly in speech and language acquisition), hypotonia, autism spectrum disorder, and other behavioral problems. Additional features commonly reported include seizures, refractive errors and strabismus, hearing loss, sleep disturbance (particularly sleep apnea), feeding and gastrointestinal problems, mild genital abnormalities in males, and urinary tract involvement in both males and females. (See GeneReviews for additional details).  Recommendations: Follow-up in 1-2 months to continue discussion I will also reach out to [facility] Angela Adam at [facility] to see if there are any additional resources   Heidi Dach, MS, [facility] Certified Genetic Counselor  Artist Pais, D.O. Attending Physician, [facility] Date: 08/05/2021 Time: 11:53am  Total time spent: 100 minutes Time spent includes face to face and non-face to face care for the patient on the date of this encounter (history and physical, genetic counseling, coordination of care, data gathering and/or documentation as outlined)

## 2021-07-28 NOTE — Progress Notes (Signed)
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   Other adverse food reactions, not elsewhere classified, subsequent encounter 08/20/2020   Other allergic rhinitis 08/20/2020   Allergic conjunctivitis of both eyes 08/20/2020   Encounter for general adult medical examination with abnormal findings 07/03/2020   Leg length discrepancy 06/05/2020   Hidradenitis suppurativa 06/05/2020   Intellectual disability 06/05/2020   Achilles tendon contracture, bilateral    Elevated sedimentation rate 02/07/2020   Pain in right ankle and joints of right foot 02/07/2020   Anemia 12/29/2018   Type 2 diabetes mellitus ([place]) 08/15/2017   Elevated triglycerides with high cholesterol 12/09/2016   Partial epilepsy with impairment of consciousness ([place]) 08/16/2012   Mild intellectual disability 08/16/2012   Laxity of ligament 08/16/2012   Delayed milestones 08/16/2012   Obesity 06/07/2011   Dyspepsia 02/11/2011   Acanthosis nigricans 02/11/2011   Global developmental delay    Petit mal without grand mal seizures ([place])    Dyspraxia       HPI: Deanna Levine is a 21 y.o. female dm, seizure disorder, intellectual disability referred here for gi pcr of vibrio in setting chronic diarrhea  [name] accompanied by her dad here today  She was seen by gi about 2 weeks ago for 1 month of diarrhea. Watery up to 7 times a day  She didn't recall any fever/chill, bloody stool at the onset  She reports though eating sea food before this diarrhea illness occur. She ate shrimp/fish in a restaurant ([employer] in [place]). She ate with her parents; mother ate  shrimp scampi. Only patient had symptoms  She also ate sea food at [employer] 2 days after that  Diarrhea onset before [facility] [employer] visit.   Dairy makes it worse at this time  Patient haven't taken any antibiotics  She otherwise been feeling well Eating well - good appetite; no weight loss Occasional abdominal cramping with diarhea still; sx also with periods of normal solid stool No joint pain No skin rash No headache No fever/chill   -------------------- 07/28/21 id f/u Patient had GI evaluation colonscopy on 5/18 with dr Tarri Glenn. I reviewed her note and colonoscopy result  "Inflammation around the cecal area otherwise normal colonoscopy." Biopsy showed no granuloma there outside of active chronic inflammatory changes without granuloma/dysplasia or crypt abscesses or mention of infectious particles  She continues to do better. 2 soft stools a day. No sign of dehydration. No weight loss. No fatigue. No fever/chill  Patient has anemia as well which GI is considering in setting of chronic diarrhea   She mentions that her mother have ulcerative colitis.   Patient is not taking immodium  She didn't send a stool culture previously to identify if vibrio would grow what species it is     Review of Systems: ROS  All other ros negative       Past Medical History:  Diagnosis Date   Allergy    Complication of anesthesia    slow to wake up from anesthesia, occ HA with anesthesia    Diabetes mellitus without complication (HCC)    type 2   Difficulty swallowing pills  Dyspraxia    Eczema    Global developmental delay    Obesity (BMI 30-39.9)    PONV (postoperative nausea and vomiting)    Precocious female puberty    Seizures ([place])    last seizure 2012 - no current med   Sleep apnea    White-Sutton syndrome     Social History   Tobacco Use   Smoking status: Never    Passive exposure: Never   Smokeless tobacco: Never  Vaping Use   Vaping Use: Never used   Substance Use Topics   Alcohol use: No    Alcohol/week: 0.0 standard drinks   Drug use: No    Family History  Problem Relation Age of Onset   Diabetes Mother        steroid induced; hx. colitis   Anesthesia problems Mother        severe headache lasting 2-3 days   Rheum arthritis Mother    Ulcerative colitis Mother    Fibromyalgia Mother    Hypertension Maternal Grandmother    Hyperlipidemia Maternal Grandmother    Diabetes Maternal Grandmother    Kidney disease Maternal Grandfather    Hypertension Maternal Grandfather    Heart disease Maternal Grandfather    Hyperlipidemia Maternal Grandfather    Kidney disease Paternal Grandmother    Hypertension Paternal Grandmother    Diabetes Paternal Grandmother    Heart disease Paternal Grandmother    Hyperlipidemia Paternal Grandmother    Hypertension Paternal Grandfather    Hyperlipidemia Paternal Grandfather    Henoch-Schonlein purpura Brother        in remission   Asthma Maternal Aunt    Seizures Maternal Uncle     No Known Allergies  OBJECTIVE: Vitals:   07/28/21 0841  BP: 106/73  Pulse: 96  Temp: 98 F (36.7 C)  TempSrc: Temporal  SpO2: 99%  Weight: 217 lb 8 oz (98.7 kg)   Body mass index is 36.68 kg/m.   Physical Exam General/constitutional: no distress, pleasant HEENT: Normocephalic, PER, Conj Clear, EOMI, Oropharynx clear Neck supple CV: rrr no mrg Lungs: clear to auscultation, normal respiratory effort Abd: Soft, Nontender Ext: no edema Skin: No Rash Neuro: nonfocal MSK: no peripheral joint swelling/tenderness/warmth; back spines nontender    Lab: Lab Results  Component Value Date   WBC 8.6 02/12/2021   HGB 11.2 (L) 02/12/2021   HCT 36.6 02/12/2021   MCV 67.8 (L) 02/12/2021   PLT 409 (H) 25/85/2778   Last metabolic panel Lab Results  Component Value Date   GLUCOSE 111 (H) 02/12/2021   NA 138 02/12/2021   K 4.1 02/12/2021   CL 102 02/12/2021   CO2 22 02/12/2021   BUN 14 02/12/2021    CREATININE 0.64 02/12/2021   GFRNONAA >60 04/25/2020   CALCIUM 10.7 (H) 02/12/2021   PROT 8.4 (H) 02/12/2021   ALBUMIN 4.4 03/05/2019   LABGLOB 2.6 03/05/2019   AGRATIO 1.7 03/05/2019   BILITOT 0.5 02/12/2021   ALKPHOS 116 (H) 03/05/2019   AST 15 02/12/2021   ALT 15 02/12/2021   ANIONGAP 11 04/25/2020    Microbiology:  Serology: 3/23 pcr stool negative for cdiff, positive for vibrio (non cholera)  Imaging:   Assessment/plan: Problem List Items Addressed This Visit   None  Interesting presentation. I am not awared of any food borne vibrio outbreak recently in [place]  I am not sure what the operating characteristics of gi pcr on different species of non-cholera vibrio as not all of them caused diarrheal illness  If  it was true vibrio infection, what I am hearing now is a post-infectious IBS type response which will resolve in several weeks  It is worth a try to give her 7 days doxycycline (can't take pills so given oral suspension form)  Give stool culture first for speciation/abx susceptibility testing prior to taking antibiotics  Ibs type diet suggested  See GI in may for colonoscopy/continued chronic diarrhea follow up then see me after that    ---------------------- 07/28/21 assessment Patient doing well from ID standpoint Cecal inflammation on biopsy but nothing at this time to suggest infection. Also revisit topic of infectious diarrhea and this doesn't behave as such  Advise her to follow up GI (has appointment in 1 week) regarding repeat colonsocopy/ct and other diagnostics planned. She does have family hx crohn's and she also has White-Sutton syndrome which I am not familiar if can contribute to GI syndrome  Follow up with ID as needed  I have spent a total of 30 minutes of face-to-face and non-face-to-face time, excluding clinical staff time, preparing to see patient, ordering tests and/or medications, and provide counseling the  patient     .   Follow-up: Return if symptoms worsen or fail to improve.  Jabier Mutton, [facility] [phone] pager   [phone] cell 07/28/2021, 8:48 AM

## 2021-07-28 NOTE — Progress Notes (Deleted)
Office Visit Note  Patient: Deanna Levine             Date of Birth: 2000/04/24           MRN: [id number]             PCP: [name], MD Referring: [name], * Visit Date: 08/10/2021   Subjective:  No chief complaint on file.   History of Present Illness: Deanna Levine is a 21 y.o. female here for follow up ***   Previous HPI    No Rheumatology ROS completed.   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   Other adverse food reactions, not elsewhere classified, subsequent encounter 08/20/2020   Other allergic rhinitis 08/20/2020   Allergic conjunctivitis of both eyes 08/20/2020   Encounter for general adult medical examination with abnormal findings 07/03/2020   Leg length discrepancy 06/05/2020   Hidradenitis suppurativa 06/05/2020   Intellectual disability 06/05/2020   Achilles tendon contracture, bilateral    Elevated sedimentation rate 02/07/2020   Pain in right ankle and joints of right foot 02/07/2020   Anemia 12/29/2018   Type 2 diabetes mellitus ([place]) 08/15/2017   Elevated triglycerides with high cholesterol 12/09/2016   Partial epilepsy with impairment of consciousness ([place]) 08/16/2012   Mild intellectual disability 08/16/2012   Laxity of ligament 08/16/2012   Delayed milestones 08/16/2012   Obesity 06/07/2011   Dyspepsia 02/11/2011   Acanthosis nigricans 02/11/2011   Global developmental delay    Petit mal without grand mal seizures ([place])    Dyspraxia     Past Medical History:  Diagnosis Date   Allergy    Complication of anesthesia    slow to wake up from anesthesia, occ HA with anesthesia    Diabetes mellitus without complication ([place])    type 2   Difficulty swallowing pills     Dyspraxia    Eczema    Global developmental delay    Obesity (BMI 30-39.9)    PONV (postoperative nausea and vomiting)    Precocious female puberty    Seizures ([place])    last seizure 2012 - no current med   Sleep apnea    White-Sutton syndrome     Family History  Problem Relation Age of Onset   Diabetes Mother        steroid induced; hx. colitis   Anesthesia problems Mother        severe headache lasting 2-3 days   Rheum arthritis Mother    Ulcerative colitis Mother    Fibromyalgia Mother    Hypertension Maternal Grandmother    Hyperlipidemia Maternal Grandmother    Diabetes Maternal Grandmother    Kidney disease Maternal Grandfather    Hypertension Maternal Grandfather    Heart disease Maternal Grandfather    Hyperlipidemia Maternal Grandfather    Kidney disease Paternal Grandmother    Hypertension Paternal Grandmother    Diabetes Paternal Grandmother    Heart disease Paternal Grandmother    Hyperlipidemia Paternal Grandmother    Hypertension Paternal Grandfather    Hyperlipidemia Paternal Grandfather    Henoch-Schonlein purpura Brother        in remission   Asthma Maternal Aunt    Seizures Maternal Uncle    Past Surgical History:  Procedure Laterality Date  ADENOIDECTOMY     BIOPSY  07/16/2021   Procedure: BIOPSY;  Surgeon: Thornton Park, MD;  Location: [facility];  Service: Gastroenterology;;   bone removal Left 01/16/2021   foot   COLONOSCOPY WITH PROPOFOL N/A 07/16/2021   Procedure: COLONOSCOPY WITH PROPOFOL;  Surgeon: Thornton Park, MD;  Location: [facility];  Service: Gastroenterology;  Laterality: N/A;   GASTROCNEMIUS RECESSION Bilateral 04/25/2020   Procedure: BILATERAL GASTROCNEMIUS RECESSION;  Surgeon: Newt Minion, MD;  Location: [facility];  Service: Orthopedics;  Laterality: Bilateral;   SUPPRELIN IMPLANT  05/06/2011   Procedure: SUPPRELIN IMPLANT;  Surgeon: Jerilynn Mages. Gerald Stabs, MD;  Location: [facility];  Service: Pediatrics;   Laterality: Right;   SUPPRELIN IMPLANT Right 06/14/2013   Procedure: REMOVAL OF SUPPRELIN IMPLANT FROM RIGHT UPPER ARM;  Surgeon: Jerilynn Mages. Gerald Stabs, MD;  Location: [facility];  Service: Pediatrics;  Laterality: Right;   TONSILLECTOMY     TONSILLECTOMY AND ADENOIDECTOMY     TYMPANOSTOMY TUBE PLACEMENT     x 2   Social History   Social History Narrative   Deanna Levine is graduated. [name] doing average.    She enjoys playing softball and basketball.   Lives with her parents.    Not working.   Left handed   Two story home      Had surgery on her legs in Feb and is physical therapy twice a week.   Left handed   No caffeine   Immunization History  Administered Date(s) Administered   Influenza,inj,Quad PF,6+ Mos 12/18/2018   PFIZER(Purple Top)SARS-COV-2 Vaccination 05/15/2019, 06/07/2019     Objective: Vital Signs: There were no vitals taken for this visit.   Physical Exam   Musculoskeletal Exam: ***  CDAI Exam: CDAI Score: -- Patient Global: --; Provider Global: -- Swollen: --; Tender: -- Joint Exam 08/10/2021   No joint exam has been documented for this visit   There is currently no information documented on the homunculus. Go to the Rheumatology activity and complete the homunculus joint exam.  Investigation: No additional findings.  Imaging: No results found.  Recent Labs: Lab Results  Component Value Date   WBC 8.6 02/12/2021   HGB 11.2 (L) 02/12/2021   PLT 409 (H) 02/12/2021   NA 138 02/12/2021   K 4.1 02/12/2021   CL 102 02/12/2021   CO2 22 02/12/2021   GLUCOSE 111 (H) 02/12/2021   BUN 14 02/12/2021   CREATININE 0.64 02/12/2021   BILITOT 0.5 02/12/2021   ALKPHOS 116 (H) 03/05/2019   AST 15 02/12/2021   ALT 15 02/12/2021   PROT 8.4 (H) 02/12/2021   ALBUMIN 4.4 03/05/2019   CALCIUM 10.7 (H) 02/12/2021   GFRAA 139 03/05/2019    Speciality Comments: ****Per patient, please forward labs to Dr. Acquanetta Sit at [facility]  Procedures:  No procedures performed Allergies: Patient has no known allergies.   Assessment / Plan:     Visit Diagnoses: No diagnosis found.  ***  Orders: No orders of the defined types were placed in this encounter.  No orders of the defined types were placed in this encounter.    Follow-Up Instructions: No follow-ups on file.   Earnestine Mealing, CMA  Note - This record has been created using Editor, commissioning.  Chart creation errors have been sought, but may not always  have been located. Such creation errors do not reflect on  the standard of medical care.

## 2021-07-30 ENCOUNTER — Encounter (INDEPENDENT_AMBULATORY_CARE_PROVIDER_SITE_OTHER): Payer: Self-pay | Admitting: Pediatric Genetics

## 2021-07-30 ENCOUNTER — Ambulatory Visit (INDEPENDENT_AMBULATORY_CARE_PROVIDER_SITE_OTHER): Payer: 59 | Admitting: Pediatric Genetics

## 2021-07-30 ENCOUNTER — Encounter: Payer: Self-pay | Admitting: Internal Medicine

## 2021-07-30 VITALS — Wt 214.6 lb

## 2021-07-30 DIAGNOSIS — Q8789 Other specified congenital malformation syndromes, not elsewhere classified: Secondary | ICD-10-CM | POA: Diagnosis not present

## 2021-07-30 NOTE — Patient Instructions (Signed)
At [facility], we are committed to providing exceptional care. You will receive a patient satisfaction survey through text or email regarding your visit today. Your opinion is important to me. Comments are appreciated.

## 2021-07-31 LAB — CALPROTECTIN, FECAL: Calprotectin, Fecal: 137 ug/g — ABNORMAL HIGH (ref 0–120)

## 2021-08-05 ENCOUNTER — Ambulatory Visit: Payer: 59 | Admitting: Physician Assistant

## 2021-08-05 ENCOUNTER — Encounter (INDEPENDENT_AMBULATORY_CARE_PROVIDER_SITE_OTHER): Payer: Self-pay | Admitting: Pediatric Genetics

## 2021-08-06 ENCOUNTER — Ambulatory Visit (HOSPITAL_BASED_OUTPATIENT_CLINIC_OR_DEPARTMENT_OTHER)
Admission: RE | Admit: 2021-08-06 | Discharge: 2021-08-06 | Disposition: A | Discharge status: Home / Self Care | Payer: 59 | Source: Ambulatory Visit | Attending: Internal Medicine | Admitting: Internal Medicine

## 2021-08-06 DIAGNOSIS — R197 Diarrhea, unspecified: Secondary | ICD-10-CM | POA: Insufficient documentation

## 2021-08-06 LAB — POCT I-STAT CREATININE: Creatinine, Ser: 0.7 mg/dL (ref 0.44–1.00)

## 2021-08-06 MED ORDER — IOHEXOL 300 MG/ML  SOLN
100.0000 mL | Freq: Once | INTRAMUSCULAR | Status: AC | PRN
  Administered 2021-08-06: 100 mL via INTRAVENOUS

## 2021-08-07 ENCOUNTER — Encounter (INDEPENDENT_AMBULATORY_CARE_PROVIDER_SITE_OTHER): Payer: Self-pay | Admitting: Pediatric Genetics

## 2021-08-10 ENCOUNTER — Ambulatory Visit: Payer: 59 | Admitting: Internal Medicine

## 2021-08-10 ENCOUNTER — Telehealth: Payer: Self-pay | Admitting: Internal Medicine

## 2021-08-10 NOTE — Telephone Encounter (Signed)
Reached out to the patient to reschedule appointment on 6/12. Patient stated that [facility] does not do occupational therapy. Patient requests a referral be sent to [facility] on [facility]. [phone]

## 2021-08-10 NOTE — Telephone Encounter (Signed)
Referral changed, pending appt

## 2021-08-13 ENCOUNTER — Ambulatory Visit (INDEPENDENT_AMBULATORY_CARE_PROVIDER_SITE_OTHER): Payer: 59 | Admitting: Internal Medicine

## 2021-08-13 ENCOUNTER — Encounter: Payer: Self-pay | Admitting: Internal Medicine

## 2021-08-13 VITALS — BP 124/85 | HR 108 | Resp 16 | Ht 64.0 in | Wt 217.0 lb

## 2021-08-13 DIAGNOSIS — L732 Hidradenitis suppurativa: Secondary | ICD-10-CM

## 2021-08-13 DIAGNOSIS — M2170 Unequal limb length (acquired), unspecified site: Secondary | ICD-10-CM

## 2021-08-13 DIAGNOSIS — R700 Elevated erythrocyte sedimentation rate: Secondary | ICD-10-CM

## 2021-08-13 DIAGNOSIS — M79601 Pain in right arm: Secondary | ICD-10-CM | POA: Diagnosis not present

## 2021-08-13 DIAGNOSIS — M25571 Pain in right ankle and joints of right foot: Secondary | ICD-10-CM

## 2021-08-13 DIAGNOSIS — M79602 Pain in left arm: Secondary | ICD-10-CM

## 2021-08-13 NOTE — Progress Notes (Unsigned)
Office Visit Note  Patient: Deanna Levine             Date of Birth: 10-28-2000           MRN: [id number]             PCP: [name], MD Referring: [name], * Visit Date: 08/13/2021   Subjective:  Results (Doing good,lab results)   History of Present Illness: Deanna Levine is a 21 y.o. female here for follow up for joint pains and hand numbness and persistent elevated inflammatory markers. Her joint symptoms are about the same as before. She had a colonoscopy for chronic diarrhea on 5/18 and subsequent abdominal CT imaging 6/8. ***   Previous HPI 07/07/21 Deanna Levine is a 21 y.o. female here for follow up with multiple joint pains and elevated inflammatory markers. She still has some pain in the right foot and ankle worst with prolonged standing. For the past 2-3 months she now has pain and numbness affecting both arms. She sometimes has pain in her whole arm. Often gets some finger pain and stiffness disrupting her sleep. Numbness lasting all day sometimes. She was found to have seizure activity on EEG study and is seeing neurology for ongoing management, now with Dr. Delice Lesch.   Previous HPI 02/07/2020 Deanna Levine is a 21 y.o. female with a history of epilepsy, developmental delay, hidradenitis suppurativa, and leg length discrepancy here for evaluation of elevated ESR and CRP with history of right leg and foot pain. This pain has been worsening over the past few months. [name] standing for about 8 hours or more most days for work and pain is increased with use throughout the day and maximal at night afterwards. There is some lower extremity swelling worsening during the day. She has started using a shoe lift for leg length discrepancy but continues to have worse pain in the left ankle. She denies any past injuries or surgeries to this site. She does have hidradenitis suppurativa treated intermittently with antibiotics and topical Hibiclens currently  controlled. Her mother notes she may have worsened pain after eating large amounts of gluten and starches but does not have associated digestive complaints.. She denies eye inflammation or redness. She has no oral ulcers, lymphadenopathy, diarrhea or blood in stools.   Labs reviewed 11/2019 ESR 29 CRP 20.8   09/2019 RF negative CCP negative ANA negative Vitamin D 26 Uric acid 6.4   Review of Systems  Constitutional:  Negative for fatigue.  HENT:  Negative for mouth dryness.   Eyes:  Negative for dryness.  Respiratory:  Negative for shortness of breath.   Gastrointestinal:  Negative for constipation.  Endocrine: Positive for cold intolerance.  Genitourinary:  Negative for difficulty urinating.  Musculoskeletal:  Positive for joint pain, joint pain and joint swelling.  Skin:  Positive for rash.  Allergic/Immunologic: Negative for susceptible to infections.  Neurological:  Negative for numbness.  Hematological:  Negative for bruising/bleeding tendency.  Psychiatric/Behavioral:  Negative for sleep disturbance.    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   Other adverse food reactions, not elsewhere classified, subsequent encounter 08/20/2020   Other allergic rhinitis 08/20/2020   Allergic conjunctivitis of both eyes 08/20/2020   Encounter for general adult medical examination with  abnormal findings 07/03/2020   Leg length discrepancy 06/05/2020   Hidradenitis suppurativa 06/05/2020   Intellectual disability 06/05/2020   Achilles tendon contracture, bilateral    Elevated sedimentation rate 02/07/2020   Pain in right ankle and joints of right foot 02/07/2020   Anemia 12/29/2018   Type 2 diabetes mellitus ([place]) 08/15/2017   Elevated  triglycerides with high cholesterol 12/09/2016   Partial epilepsy with impairment of consciousness ([place]) 08/16/2012   Mild intellectual disability 08/16/2012   Laxity of ligament 08/16/2012   Delayed milestones 08/16/2012   Obesity 06/07/2011   Dyspepsia 02/11/2011   Acanthosis nigricans 02/11/2011   Global developmental delay    Petit mal without grand mal seizures ([place])    Dyspraxia     Past Medical History:  Diagnosis Date   Allergy    Colitis    Complication of anesthesia    slow to wake up from anesthesia, occ HA with anesthesia    Diabetes mellitus without complication ([place])    type 2   Difficulty swallowing pills    Dyspraxia    Eczema    Global developmental delay    Obesity (BMI 30-39.9)    PONV (postoperative nausea and vomiting)    Precocious female puberty    Seizures ([place])    last seizure 2012 - no current med   Sleep apnea    White-Sutton syndrome     Family History  Problem Relation Age of Onset   Diabetes Mother        steroid induced; hx. colitis   Anesthesia problems Mother        severe headache lasting 2-3 days   Rheum arthritis Mother    Ulcerative colitis Mother    Fibromyalgia Mother    Hypertension Maternal Grandmother    Hyperlipidemia Maternal Grandmother    Diabetes Maternal Grandmother    Kidney disease Maternal Grandfather    Hypertension Maternal Grandfather    Heart disease Maternal Grandfather    Hyperlipidemia Maternal Grandfather    Kidney disease Paternal Grandmother    Hypertension Paternal Grandmother    Diabetes Paternal Grandmother    Heart disease Paternal Grandmother    Hyperlipidemia Paternal Grandmother    Hypertension Paternal Grandfather    Hyperlipidemia Paternal Grandfather    Henoch-Schonlein purpura Brother        in remission   Asthma Maternal Aunt    Seizures Maternal Uncle    Past Surgical History:  Procedure Laterality Date   ADENOIDECTOMY     BIOPSY  07/16/2021   Procedure: BIOPSY;  Surgeon: Thornton Park, MD;  Location: [facility];  Service: Gastroenterology;;   bone removal Left 01/16/2021   foot   COLONOSCOPY WITH PROPOFOL N/A 07/16/2021   Procedure: COLONOSCOPY WITH PROPOFOL;  Surgeon: Thornton Park, MD;  Location: [facility];  Service: Gastroenterology;  Laterality: N/A;   GASTROCNEMIUS RECESSION Bilateral 04/25/2020   Procedure: BILATERAL GASTROCNEMIUS RECESSION;  Surgeon: Newt Minion, MD;  Location: [facility];  Service: Orthopedics;  Laterality: Bilateral;   SUPPRELIN IMPLANT  05/06/2011   Procedure: SUPPRELIN IMPLANT;  Surgeon: Jerilynn Mages. Gerald Stabs, MD;  Location: [facility];  Service: Pediatrics;  Laterality: Right;   SUPPRELIN IMPLANT Right 06/14/2013   Procedure: REMOVAL OF SUPPRELIN IMPLANT FROM RIGHT UPPER ARM;  Surgeon: Jerilynn Mages. Gerald Stabs, MD;  Location: [facility];  Service: Pediatrics;  Laterality: Right;   TONSILLECTOMY     TONSILLECTOMY AND ADENOIDECTOMY     TYMPANOSTOMY TUBE PLACEMENT     x 2  Social History   Social History Narrative   Deanna Levine is graduated. [name] doing average.    She enjoys playing softball and basketball.   Lives with her parents.    Not working.   Left handed   Two story home      Had surgery on her legs in Feb and is physical therapy twice a week.   Left handed   No caffeine   Immunization History  Administered Date(s) Administered   Influenza,inj,Quad PF,6+ Mos 12/18/2018   PFIZER(Purple Top)SARS-COV-2 Vaccination 05/15/2019, 06/07/2019     Objective: Vital Signs: BP 124/85 (BP Location: Left Arm, Patient Position: Sitting, Cuff Size: Normal)   Pulse (!) 108   Resp 16   Ht 5' 4"  (1.626 m)   Wt 217 lb (98.4 kg)   LMP 05/06/2021   BMI 37.25 kg/m    Physical Exam Constitutional:      Appearance: [name] obese.  Cardiovascular:     Rate and Rhythm: Normal rate and regular rhythm.  Pulmonary:     Effort: Pulmonary effort is normal.     Breath sounds: Normal breath sounds.  Skin:     General: Skin is warm and dry.  Neurological:     Mental Status: [name] alert.     Motor: No weakness.  Psychiatric:        Mood and Affect: Mood normal.      Musculoskeletal Exam:  Shoulders full ROM no tenderness or swelling Elbows full ROM no tenderness or swelling Wrist tenderness to pressure on right side, positive tinel's Finger ROM intact, no synovitis, right 2nd, 3rd, and 5th PIP tenderness and pain with full flexion Knees full ROM no tenderness or swelling  Investigation: No additional findings.  Imaging: CT ENTERO ABD/PELVIS W CONTAST  Result Date: 08/07/2021 CLINICAL DATA:  Diarrhea and recent colonoscopy with colitis. EXAM: CT ABDOMEN AND PELVIS WITH CONTRAST (ENTEROGRAPHY) TECHNIQUE: Multidetector CT of the abdomen and pelvis during bolus administration of intravenous contrast. Negative oral contrast was given. RADIATION DOSE REDUCTION: This exam was performed according to the departmental dose-optimization program which includes automated exposure control, adjustment of the mA and/or kV according to patient size and/or use of iterative reconstruction technique. CONTRAST:  147m OMNIPAQUE IOHEXOL 300 MG/ML  SOLN COMPARISON:  None Available. FINDINGS: Lower chest:  No acute abnormality. Hepatobiliary: Hypodensity along the falciform ligament commonly reflects hepatic steatosis or differential perfusion (veins of [place]). Gallbladder is unremarkable. No biliary ductal dilation. Pancreas: No pancreatic ductal dilation or evidence of acute inflammation. Spleen: No splenomegaly or focal splenic lesion. Adrenals/Urinary Tract: Bilateral adrenal glands appear normal. No hydronephrosis. Kidneys demonstrate symmetric enhancement. Urinary bladder is unremarkable for degree of distension. Stomach/Bowel: Stomach is distended by ingested material with wall thickening versus focal peristalsis at the pylorus/gastric antrum. No pathologic dilation of small or large bowel. Appendix is not  confidently identified however there is no pericecal inflammation. Terminal ileum appears normal. No evidence of acute bowel inflammation. Colonic diverticulosis without findings of acute diverticulitis. Vascular/Lymphatic: Normal caliber abdominal aorta. No pathologically enlarged abdominal or pelvic lymph nodes. Reproductive: No mass or other significant abnormality. Other: Significant abdominopelvic free fluid.  No pneumoperitoneum. Musculoskeletal: No suspicious bone lesions identified. IMPRESSION: 1. No convincing evidence of acute bowel inflammation on this slightly motion degraded examination. 2.  Colonic diverticulosis without findings of acute diverticulitis. 3. Stomach is distended by ingested material with wall thickening versus focal peristalsis at the pylorus/gastric antrum, consider correlation for gastric hypomotility or outlet obstruction. Electronically Signed  By: Dahlia Bailiff M.D.   On: 08/07/2021 11:04   DG Foot Complete Right  Result Date: 07/29/2021 Please see detailed radiograph report in office note.   Recent Labs: Lab Results  Component Value Date   WBC 8.6 02/12/2021   HGB 11.2 (L) 02/12/2021   PLT 409 (H) 02/12/2021   NA 138 02/12/2021   K 4.1 02/12/2021   CL 102 02/12/2021   CO2 22 02/12/2021   GLUCOSE 111 (H) 02/12/2021   BUN 14 02/12/2021   CREATININE 0.70 08/06/2021   BILITOT 0.5 02/12/2021   ALKPHOS 116 (H) 03/05/2019   AST 15 02/12/2021   ALT 15 02/12/2021   PROT 8.4 (H) 02/12/2021   ALBUMIN 4.4 03/05/2019   CALCIUM 10.7 (H) 02/12/2021   GFRAA 139 03/05/2019    Speciality Comments: ****Per patient, please forward labs to Dr. Acquanetta Sit at [facility]  Procedures:  No procedures performed Allergies: Patient has no known allergies.   Assessment / Plan:     Visit Diagnoses: Bilateral arm pain  Leg length discrepancy  Pain in right ankle and joints of right foot  Hidradenitis suppurativa  Elevated sedimentation  rate  ***  Orders: No orders of the defined types were placed in this encounter.  No orders of the defined types were placed in this encounter.    Follow-Up Instructions: No follow-ups on file.   Collier Salina, MD  Note - This record has been created using [employer].  Chart creation errors have been sought, but may not always  have been located. Such creation errors do not reflect on  the standard of medical care.

## 2021-08-13 NOTE — Patient Instructions (Signed)
[facility] [id number]  [phone]   [facility] [place] [address]  [phone]

## 2021-08-18 ENCOUNTER — Telehealth (HOSPITAL_COMMUNITY): Payer: Self-pay | Admitting: Pharmacy Technician

## 2021-08-18 ENCOUNTER — Ambulatory Visit (INDEPENDENT_AMBULATORY_CARE_PROVIDER_SITE_OTHER): Payer: 59 | Admitting: Neurology

## 2021-08-18 ENCOUNTER — Encounter: Payer: Self-pay | Admitting: Neurology

## 2021-08-18 VITALS — BP 119/80 | HR 98 | Ht 64.0 in | Wt 220.0 lb

## 2021-08-18 DIAGNOSIS — G43009 Migraine without aura, not intractable, without status migrainosus: Secondary | ICD-10-CM | POA: Diagnosis not present

## 2021-08-18 DIAGNOSIS — G40409 Other generalized epilepsy and epileptic syndromes, not intractable, without status epilepticus: Secondary | ICD-10-CM | POA: Diagnosis not present

## 2021-08-18 MED ORDER — ZONISAMIDE 100 MG/5ML PO SUSP: ORAL | 11 refills | Status: DC

## 2021-08-18 NOTE — Patient Instructions (Signed)
Good to see you.  Start Zonisamide 168m/5mL suspension: take 5 mL every night  2. Keep a calendar of the headaches and staring spells  3. Start using the wrist brace and do the exercises from Dr. RBenjamine Mola 4. Follow-up in 3-4 months, call for any changes   Seizure Precautions: 1. If medication has been prescribed for you to prevent seizures, take it exactly as directed.  Do not stop taking the medicine without talking to your doctor first, even if you have not had a seizure in a long time.   2. Avoid activities in which a seizure would cause danger to yourself or to others.  Don't operate dangerous machinery, swim alone, or climb in high or dangerous places, such as on ladders, roofs, or girders.  Do not drive unless your doctor says you may.  3. If you have any warning that you may have a seizure, lay down in a safe place where you can't hurt yourself.    4.  No driving for 6 months from last seizure, [facility]   Please refer to the following link on the EMillsapwebsite for more information: http://www.epilepsyfoundation.org/answerplace/Social/driving/drivingu.cfm   5.  Maintain good sleep hygiene. Avoid alcohol.  6.  Notify your neurology if you are planning pregnancy or if you become pregnant.  7.  Contact your doctor if you have any problems that may be related to the medicine you are taking.  8.  Call 911 and bring the patient back to the ED if:        A.  The seizure lasts longer than 5 minutes.       B.  The patient doesn't awaken shortly after the seizure  C.  The patient has new problems such as difficulty seeing, speaking or moving  D.  The patient was injured during the seizure  E.  The patient has a temperature over 102 F (39C)  F.  The patient vomited and now is having trouble breathing

## 2021-08-18 NOTE — Telephone Encounter (Signed)
Patient Advocate Encounter   Received notification that prior authorization for Zonisade 100MG/5ML suspension is required.   PA submitted on 08/18/2021 Key [id number] Status is pending       Lyndel Safe, [place] Patient Advocate Specialist [facility] Patient Advocate Team Direct Number: [phone]  Fax: [phone]

## 2021-08-18 NOTE — Progress Notes (Signed)
NEUROLOGY FOLLOW UP OFFICE NOTE  Jeanie Mccard [id number] [birth year]  HISTORY OF PRESENT ILLNESS: I had the pleasure of seeing Wylene Weissman in follow-up in the neurology clinic on 08/18/2021.  The patient was last seen 3 months ago for primary generalized epilepsy. [name] accompanied by her mother who helps supplement the history today. Her 24-hour EEG in 04/2021 showed rare generalized 4 Hz spike and wave discharges with frontal predominance seen in sleep. EEG was ordered due to report of brief staring spells. On her last visit, patient and father felt that she was not having them frequently enough to treat with medication. We agreed to keep a calendar of her symptoms. Her mother reports she has seem a couple episodes of staring during the daytime, lasting less than a minute where she has to call her name twice. Usually they happen more often when she is really focused on something. Charnette denies any gaps in time. Her mother reports she has to repeat things and go over instructions repeatedly. She has also been having more headaches, they watched a movie recently and the flashing lights bothered her. She tends to have more headaches after reading for a while. She describes pressure in the frontal region, no nausea/vomiting, lasting for 24 hours. They usually occur at night, around once a week. She usually lays down and takes Tylenol which helps sometimes. She has been having hand pain and swelling and saw Rheumatology, exercises and wrist brace were recommended, and if no change, consider EMG/NCV. She also has colitis with frequent diarrhea. She has frequent dizzy spells where she reports a spinning sensation, no falls. No focal numbness/tingling/weakness, no myoclonic jerks.    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 She was seen by [name] in August 2021 for concern for seizures. She was previously followed by pediatric neurologist Dr. Gaynell Face, notes were reviewed. Seizures began at age 21 with multiple absence seizures daily where she would drop her hand to her side and begin drooling, unresponsive for around 2 minutes followed by sleepiness. No convulsive seizures. At that time, MRI brain and initial EEG were normal. She was treated with carbamazepine with no further seizures after 2010, however repeat EEG in 2012 reported diphasic sharply contoured slow wave activity seen bilaterally synchronously in the central, temporal, and parietal region so she was continued on medication. She had another EEG in 2015 which was normal and she was tapered off carbamazepine. Of note, on her 2017 clinic visit, her mother reported that she stares sometimes but it seems to be daydreaming or not paying attention rather than seizure activity because she would respond when called. She presented to [name] in 2021 with her mother reporting very brief staring spells lasting a few seconds, different from her typical seizures because they were much shorter and would not recur. They appeared as if she was daydreaming or not paying attention, worse with stress. [name] accompanied today by her father who has not seen the staring episodes but states that she does daydream and sometimes would not respond, saying she does not hear them calling her. She states she feels good. She denies any gaps in time, olfactory/gustatory hallucinations, focal numbness/tingling,  myoclonic jerks. No headaches, dizziness, vision changes, no falls. She has been having orthopedic difficulties in her feet and hands and was ultimately diagnosed with WSS a few months ago. She was previously  working but has been taken out of work because she is unable to stand for prolonged periods and it affects her muscle skills. She manages her own medications, her father reports she is pretty good with this. Sleep is good, she sleeps 9-10 hours. She states her mood is okay, her father notes there has been some stress recently.   Epilepsy Risk Factors:  White-Sutton Syndrome. She had global developmental delay since infancy. There is no history of febrile convulsions, CNS infections such as meningitis/encephalitis, significant traumatic brain injury, neurosurgical procedures, or family history of seizures.  Prior AEDs: carbamazepine  Laboratory Data: EEGs: EEG 2012 reported diphasic sharply contoured slow wave activity seen bilaterally synchronously in the central, temporal, and parietal region EEG normal 2015 and 2018 48-hour EEG 04/2021 abnormal with rare generalized 4 Hz spike and wave discharges with frontal predominance seen in sleep.  MRI brain in 2003 unremarkable.   PAST MEDICAL HISTORY: Past Medical History:  Diagnosis Date   Allergy    Colitis    Complication of anesthesia    slow to wake up from anesthesia, occ HA with anesthesia    Diabetes mellitus without complication (HCC)    type 2   Difficulty swallowing pills    Dyspraxia    Eczema    Global developmental delay    Obesity (BMI 30-39.9)    PONV (postoperative nausea and vomiting)    Precocious female puberty    Seizures ([place])    last seizure 2012 - no current med   Sleep apnea    White-Sutton syndrome     MEDICATIONS: Current Outpatient Medications on File Prior to Visit  Medication Sig Dispense Refill   acetaminophen (TYLENOL) 160 MG/5ML elixir Take 960 mg by mouth every 4 (four) hours as needed for fever.     azelastine (ASTELIN) 0.1 % nasal spray Place 1 spray into both nostrils 2 (two) times daily. Use in each nostril as directed (Patient taking differently: Place 1 spray into both nostrils 2 (two) times  daily as needed for rhinitis. Use in each nostril as directed) 30 mL 12   cetirizine (ZYRTEC) 10 MG tablet Take 10 mg by mouth daily as needed for allergies.     chlorhexidine (HIBICLENS) 4 % external liquid Apply topically daily as needed. 120 mL 0   cholecalciferol (VITAMIN D3) 25 MCG (1000 UNIT) tablet Take 1,000 Units by mouth daily.     Continuous Blood Gluc Receiver ([place]) [place] 1 each by Does not apply route as directed. Use with G7 sensor to check blood sugars 1 each 1   Continuous Blood Gluc Sensor (DEXCOM G7 SENSOR) MISC Inject 1 Device into the skin as directed. Change sensor every 10 days. 3 each 5   dicyclomine (BENTYL) 20 MG tablet Take a half tablet 4 times daily before meals. (Patient not taking: Reported on 07/28/2021) 60 tablet 3   etonogestrel (NEXPLANON) 68 MG IMPL implant 1 each (68 mg total) by Subdermal route once. 1 each 0   fluticasone (FLONASE) 50 MCG/ACT nasal spray SHAKE LIQUID AND USE 2 SPRAYS IN EACH NOSTRIL DAILY 16 g 6   meloxicam (MOBIC) 15 MG tablet Take 15 mg by mouth daily.     Menaquinone-7 (VITAMIN K2) 100 MCG CAPS Take 100 mcg by mouth daily.  Metamucil Fiber CHEW Chew 1 tablet by mouth daily.     methylPREDNISolone (MEDROL DOSEPAK) 4 MG TBPK tablet 6 day dose pack - take as directed (Patient not taking: Reported on 08/13/2021) 21 tablet 0   Multiple Vitamins-Minerals (MULTIVITAMIN ADULT) CHEW Chew 1 tablet by mouth daily.     Na Sulfate-K Sulfate-Mg Sulf 17.5-3.13-1.6 GM/177ML SOLN See admin instructions.     norethindrone (AYGESTIN) 5 MG tablet Take 1 tablet (5 mg total) by mouth 2 (two) times daily as needed (bleeding). For spotting take 1 tab daily. If flow take 2 tabs daily. If heavy bleeding take 3 tabs daily. When bleeding has stopped you can stop the medication. 180 tablet 3   olopatadine (PATANOL) 0.1 % ophthalmic solution Place 1 drop into both eyes 2 (two) times daily as needed for allergies.     spironolactone (ALDACTONE) 50 MG tablet  Take 50 mg by mouth daily.     sulfamethoxazole-trimethoprim (BACTRIM) 200-40 MG/5ML suspension Take 20 mLs by mouth 2 (two) times daily.     tirzepatide (MOUNJARO) 10 MG/0.5ML Pen Inject 10 mg into the skin once a week. 2 mL 2   No current facility-administered medications on file prior to visit.    ALLERGIES: No Known Allergies  FAMILY HISTORY: Family History  Problem Relation Age of Onset   Diabetes Mother        steroid induced; hx. colitis   Anesthesia problems Mother        severe headache lasting 2-3 days   Rheum arthritis Mother    Ulcerative colitis Mother    Fibromyalgia Mother    Hypertension Maternal Grandmother    Hyperlipidemia Maternal Grandmother    Diabetes Maternal Grandmother    Kidney disease Maternal Grandfather    Hypertension Maternal Grandfather    Heart disease Maternal Grandfather    Hyperlipidemia Maternal Grandfather    Kidney disease Paternal Grandmother    Hypertension Paternal Grandmother    Diabetes Paternal Grandmother    Heart disease Paternal Grandmother    Hyperlipidemia Paternal Grandmother    Hypertension Paternal Grandfather    Hyperlipidemia Paternal Grandfather    Henoch-Schonlein purpura Brother        in remission   Asthma Maternal Aunt    Seizures Maternal Uncle     SOCIAL HISTORY: Social History   Socioeconomic History   Marital status: Single    Spouse name: Not on file   Number of children: 79   Years of education: Not on file   Highest education level: Not on file  Occupational History   Occupation: [employer]  Tobacco Use   Smoking status: Never    Passive exposure: Never   Smokeless tobacco: Never  Vaping Use   Vaping Use: Never used  Substance and Sexual Activity   Alcohol use: No    Alcohol/week: 0.0 standard drinks of alcohol   Drug use: No   Sexual activity: Never    Birth control/protection: None  Other Topics Concern   Not on file  Social History Narrative   Daija is graduated. [name] doing  average.    She enjoys playing softball and basketball.   Lives with her parents.    Not working.   Left handed   Two story home      Had surgery on her legs in Feb and is physical therapy twice a week.   Left handed   No caffeine   Social Determinants of Health   Financial Resource Strain: Not on file  Food  Insecurity: Not on file  Transportation Needs: Not on file  Physical Activity: Not on file  Stress: Not on file  Social Connections: Not on file  Intimate Partner Violence: Not on file     PHYSICAL EXAM: Vitals:   08/18/21 1423  BP: 119/80  Pulse: 98  SpO2: 99%   General: No acute distress Head:  Normocephalic/atraumatic Skin/Extremities: No rash, no edema Neurological Exam: alert and awake. No aphasia or dysarthria. Fund of knowledge is appropriate. Attention and concentration are normal.   Cranial nerves: Pupils equal, round. Extraocular movements intact with no nystagmus. Visual fields full.  No facial asymmetry.  Motor: Bulk and tone normal, muscle strength 5/5 throughout with no pronator drift.   Finger to nose testing intact.  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 Her 24-hour EEG showed rare generalized 4 Hz spike and wave discharges with frontal predominance in sleep. Her mother has noticed a couple of staring episodes, she has also been having frequent headaches. We discussed starting Zonisamide which can help with seizure and headache prophylaxis, side effects discussed. She has difficulty swallowing pills and will start the Zonisamide suspension 136m/5mL: take 530mevery night. Side effects discussed. She was advised to keep a calender of the  headaches and staring spells as we start medication. Discussed using the wrist brace as Dr. RiBenjamine Molaecommended, we can set up the EMG/NCV if needed in the future. She does not drive. Follow-up in 3-4 months, call for any changes.   Thank you for allowing me to participate in her care.  Please do not hesitate to call for any questions or concerns.    KaEllouise NewerM.D.   CC: Dr. CrSharlet SalinaDr. RiBenjamine Mola

## 2021-08-20 ENCOUNTER — Other Ambulatory Visit (HOSPITAL_COMMUNITY): Payer: Self-pay

## 2021-08-20 ENCOUNTER — Telehealth: Payer: Self-pay | Admitting: Neurology

## 2021-08-20 NOTE — Telephone Encounter (Signed)
Patient Advocate Encounter  Received notification that the request for prior authorization for Zonisade 100MG/5ML suspension  has been denied.     This determination is currently being appealed   This encounter will continue to be updated until final determination.    Lyndel Safe, [place] Patient Advocate Specialist [facility] Patient Advocate Team Direct Number: [phone]  Fax: [phone]

## 2021-08-20 NOTE — Telephone Encounter (Signed)
[name] from [facility] prior [employer] called in regards to [place].  Callback # [phone].

## 2021-08-20 NOTE — Telephone Encounter (Signed)
Patient Advocate Encounter  Prior Authorization for  Zonisade 100MG/5ML suspension   has been approved.    PA# [id number] Effective dates: 08/18/2021 through 08/21/2022      Lyndel Safe, [place] Patient Advocate Specialist [place] Patient Advocate Team Direct Number: [phone]  Fax: [phone]

## 2021-08-21 NOTE — Telephone Encounter (Signed)
Pt called no answer left a voice mail that her Deanna Levine had been approved she can pick her medication up from the pharmacy

## 2021-08-24 ENCOUNTER — Encounter: Payer: Self-pay | Admitting: Internal Medicine

## 2021-08-24 ENCOUNTER — Telehealth (INDEPENDENT_AMBULATORY_CARE_PROVIDER_SITE_OTHER): Payer: Self-pay | Admitting: Pharmacist

## 2021-08-24 ENCOUNTER — Encounter (INDEPENDENT_AMBULATORY_CARE_PROVIDER_SITE_OTHER): Payer: Self-pay | Admitting: Pediatric Endocrinology

## 2021-08-24 ENCOUNTER — Ambulatory Visit (INDEPENDENT_AMBULATORY_CARE_PROVIDER_SITE_OTHER): Payer: 59 | Admitting: Pediatric Endocrinology

## 2021-08-24 VITALS — BP 112/74 | HR 80 | Ht 64.17 in | Wt 217.0 lb

## 2021-08-24 DIAGNOSIS — E11649 Type 2 diabetes mellitus with hypoglycemia without coma: Secondary | ICD-10-CM | POA: Diagnosis not present

## 2021-08-24 DIAGNOSIS — R569 Unspecified convulsions: Secondary | ICD-10-CM | POA: Diagnosis not present

## 2021-08-24 DIAGNOSIS — E11628 Type 2 diabetes mellitus with other skin complications: Secondary | ICD-10-CM

## 2021-08-24 LAB — POCT GLUCOSE (DEVICE FOR HOME USE): POC Glucose: 184 mg/dl — AB (ref 70–99)

## 2021-08-24 MED ORDER — CLINDAMYCIN PHOS-BENZOYL PEROX 1.2-5 % EX GEL: CUTANEOUS | 3 refills | Status: DC

## 2021-08-25 LAB — COMPREHENSIVE METABOLIC PANEL
AG Ratio: 1.6 (calc) (ref 1.0–2.5)
ALT: 11 U/L (ref 6–29)
AST: 11 U/L (ref 10–30)
Albumin: 4.5 g/dL (ref 3.6–5.1)
Alkaline phosphatase (APISO): 74 U/L (ref 31–125)
BUN: 10 mg/dL (ref 7–25)
CO2: 23 mmol/L (ref 20–32)
Calcium: 9.7 mg/dL (ref 8.6–10.2)
Chloride: 105 mmol/L (ref 98–110)
Creat: 0.7 mg/dL (ref 0.50–0.96)
Globulin: 2.9 g/dL (calc) (ref 1.9–3.7)
Glucose, Bld: 126 mg/dL (ref 65–139)
Potassium: 3.9 mmol/L (ref 3.5–5.3)
Sodium: 139 mmol/L (ref 135–146)
Total Bilirubin: 0.6 mg/dL (ref 0.2–1.2)
Total Protein: 7.4 g/dL (ref 6.1–8.1)

## 2021-08-25 LAB — HEMOGLOBIN A1C
Hgb A1c MFr Bld: 6.3 % of total Hgb — ABNORMAL HIGH (ref <5.7)
Mean Plasma Glucose: 134 mg/dL
eAG (mmol/L): 7.4 mmol/L

## 2021-08-26 ENCOUNTER — Ambulatory Visit: Payer: 59 | Admitting: Gastroenterology

## 2021-08-26 ENCOUNTER — Other Ambulatory Visit (HOSPITAL_BASED_OUTPATIENT_CLINIC_OR_DEPARTMENT_OTHER): Payer: Self-pay

## 2021-08-26 ENCOUNTER — Encounter (INDEPENDENT_AMBULATORY_CARE_PROVIDER_SITE_OTHER): Payer: Self-pay | Admitting: Pharmacist

## 2021-08-26 DIAGNOSIS — L700 Acne vulgaris: Secondary | ICD-10-CM | POA: Insufficient documentation

## 2021-08-26 MED ORDER — MOUNJARO 10 MG/0.5ML ~~LOC~~ SOAJ
10.0000 mg | SUBCUTANEOUS | 2 refills | Status: DC
  Filled 2021-08-26: qty 2, 28d supply, fill #0

## 2021-08-26 NOTE — Progress Notes (Unsigned)
08/26/2021 Deanna Levine [id number] 23-Oct-2000   Chief Complaint:  History of Present Illness: Deanna Levine is a 21  year old female with a past medical history of anxiety, obesity, DM II, sleep apnea, seizures (White Sutton Syndrome), chronic diarrhea, microcytic anemia, thrombocytosis. [name] known by Dr. Tarri Glenn.   Mother with history of ulcerative colitis.   Mom under evaluation for IBD. Father with colon polyps. Paternal great grandmother with stomach cancer. There is no other known family history of colon cancer or polyps. No family history of stomach cancer or other GI malignancy. No family history of inflammatory bowel disease or celiac.   [place] by Dr. Tarri Glenn 05/20/2021  - GI pathogen panel, fecal calprotectin, pancreatic elastase - TTGA and IgA - Add a daily dose of Metamucil or Benefiber - Try dicyclomine 10 mg QID taken prior to meals - Colonoscopy at the hospital after obtaining clearance from  Neurology  Colonoscopy 07/16/2021: otherwise without abnormality on direct and retroflexion views. - Erythematous mucosa in the cecum. This is of unclear clinical significance. Biopsied. - The entire examined colon is normal. Biopsied. - The examined portion of the ileum was normal. Biopsied. - The examination was otherwise normal on direct and retroflexion views.  A. TERMINAL ILEUM, BIOPSY:  - Ileal mucosa with prominent lymphoid aggregates (Peyer's patches)  otherwise unremarkable  - Negative for increased intraepithelial lymphocytes or villous  architectural changes  - Negative for acute inflammation, features of chronicity or granulomas   B. COLON, CECUM, BIOPSY:  - Severely active chronic nonspecific colitis with ulceration, see  comment  - Negative for granulomas or dysplasia   C. COLON, ASCENDING, BIOPSY:  - Colonic mucosa with no specific histopathologic changes  - Negative for acute inflammation, features of chronicity, granulomas or  dysplasia   D.  COLON, TRANSVERSE, BIOPSY:  - Colonic mucosa with no specific histopathologic changes  - Negative for acute inflammation, features of chronicity, granulomas or  dysplasia   E. COLON, DESCENDING, BIOPSY:  - Colonic mucosa with no specific histopathologic changes  - Negative for acute inflammation, features of chronicity, granulomas or  dysplasia   Results suggest a cecal patch of colitis.  All other biopsies were normal. Given these results, I recommend a CTE to evaluate for additional inflammation in the small bowel, ideally before her upcoming appointment with Amy. In the meantime, if she is still having diarrhea, I recommend repeating the fecal calprotectin and after collecting the sample starting Lialda 2.4 g daily.  We will be able to discuss these results at length during her upcoming office visit.  Thank you.   [facility]  CT enterography 08/07/2021: FINDINGS: Lower chest:  No acute abnormality.   Hepatobiliary: Hypodensity along the falciform ligament commonly reflects hepatic steatosis or differential perfusion (veins of [place]). Gallbladder is unremarkable. No biliary ductal dilation.   Pancreas: No pancreatic ductal dilation or evidence of acute inflammation.   Spleen: No splenomegaly or focal splenic lesion.   Adrenals/Urinary Tract: Bilateral adrenal glands appear normal. No hydronephrosis. Kidneys demonstrate symmetric enhancement. Urinary bladder is unremarkable for degree of distension.   Stomach/Bowel: Stomach is distended by ingested material with wall thickening versus focal peristalsis at the pylorus/gastric antrum. No pathologic dilation of small or large bowel. Appendix is not confidently identified however there is no pericecal inflammation. Terminal ileum appears normal. No evidence of acute bowel inflammation. Colonic diverticulosis without findings of acute diverticulitis.   Vascular/Lymphatic: Normal caliber abdominal aorta. No pathologically enlarged  abdominal or pelvic lymph nodes.  Reproductive: No mass or other significant abnormality.   Other: Significant abdominopelvic free fluid.  No pneumoperitoneum.   Musculoskeletal: No suspicious bone lesions identified.   IMPRESSION: 1. No convincing evidence of acute bowel inflammation on this slightly motion degraded examination.   2.  Colonic diverticulosis without findings of acute diverticulitis.   3. Stomach is distended by ingested material with wall thickening versus focal peristalsis at the pylorus/gastric antrum, consider correlation for gastric hypomotility or outlet obstruction.    Current Medications, Allergies, Past Medical History, Past Surgical History, Family History and Social History were reviewed in [employer] record.   Review of Systems:   Constitutional: Negative for fever, sweats, chills or weight loss.  Respiratory: Negative for shortness of breath.   Cardiovascular: Negative for chest pain, palpitations and leg swelling.  Gastrointestinal: See HPI.  Musculoskeletal: Negative for back pain or muscle aches.  Neurological: Negative for dizziness, headaches or paresthesias.    Physical Exam: LMP 07/17/2021 (Approximate)  General: Well developed, w   ***female in no acute distress. Head: Normocephalic and atraumatic. Eyes: No scleral icterus. Conjunctiva pink . Ears: Normal auditory acuity. Mouth: Dentition intact. No ulcers or lesions.  Lungs: Clear throughout to auscultation. Heart: Regular rate and rhythm, no murmur. Abdomen: Soft, nontender and nondistended. No masses or hepatomegaly. Normal bowel sounds x 4 quadrants.  Rectal: *** Musculoskeletal: Symmetrical with no gross deformities. Extremities: No edema. Neurological: Alert oriented x 4. No focal deficits.  Psychological: Alert and cooperative. Normal mood and affect  Assessment and Recommendations: ***

## 2021-08-26 NOTE — Telephone Encounter (Signed)
Called patient on 08/26/2021 at 3:53 PM   She stated she was able to setup her Dexcom G7 successsfully on the new app.  She did not have any questions about the G7. Reviewed with her 1) shorter warm up time (30 min) 2) grace period if sensor expires 3) changing alarms after set up 4) sensor still lasts 10 days.  She told me she has had issues picking up her Mounjaro.   I sent her Darcel Bayley to the following pharmacy   [facility]  [address], [place] [address]  Phone:  [phone]  Fax:  [phone]  DEA #:  [id number]      Advised her to contact me with any further issues of obtaining Mounjaro.  Thank you for involving clinical pharmacist/diabetes educator to assist in providing this patient's care.   Drexel Iha, PharmD, BCACP, [place], CPP

## 2021-08-27 ENCOUNTER — Encounter: Payer: Self-pay | Admitting: Nurse Practitioner

## 2021-08-27 ENCOUNTER — Encounter (INDEPENDENT_AMBULATORY_CARE_PROVIDER_SITE_OTHER): Payer: Self-pay | Admitting: Pharmacist

## 2021-08-27 ENCOUNTER — Ambulatory Visit (INDEPENDENT_AMBULATORY_CARE_PROVIDER_SITE_OTHER): Payer: 59 | Admitting: *Deleted

## 2021-08-27 ENCOUNTER — Ambulatory Visit (INDEPENDENT_AMBULATORY_CARE_PROVIDER_SITE_OTHER): Payer: 59 | Admitting: Nurse Practitioner

## 2021-08-27 VITALS — BP 116/76 | HR 92 | Ht 64.0 in | Wt 215.0 lb

## 2021-08-27 DIAGNOSIS — D649 Anemia, unspecified: Secondary | ICD-10-CM

## 2021-08-27 DIAGNOSIS — M2141 Flat foot [pes planus] (acquired), right foot: Secondary | ICD-10-CM

## 2021-08-27 DIAGNOSIS — R1030 Lower abdominal pain, unspecified: Secondary | ICD-10-CM | POA: Diagnosis not present

## 2021-08-27 DIAGNOSIS — M2142 Flat foot [pes planus] (acquired), left foot: Secondary | ICD-10-CM

## 2021-08-27 MED ORDER — MESALAMINE 1.2 G PO TBEC: 2.4000 g | DELAYED_RELEASE_TABLET | Freq: Every day | ORAL | 2 refills | Status: DC

## 2021-08-27 NOTE — Patient Instructions (Addendum)
If you are age 21 or older, your body mass index should be between 23-30. Your Body mass index is 36.9 kg/m. If this is out of the aforementioned range listed, please consider follow up with your Primary Care Provider.  If you are age 38 or younger, your body mass index should be between 19-25. Your Body mass index is 36.9 kg/m. If this is out of the aformentioned range listed, please consider follow up with your Primary Care Provider.   ________________________________________________________  The [facility] providers would like to encourage you to use [facility] to communicate with providers for non-urgent requests or questions.  Due to long hold times on the telephone, sending your provider a message by [facility] may be a faster and more efficient way to get a response.  Please allow 48 business hours for a response.  Please remember that this is for non-urgent requests.  _______________________________________________________   1) START MESALAMINE (LIALDA) 1.2 GM TWO TABS ONCE DAILY. CHECK WITH PHARMACY TO VERIFY IF TABLET CAN BE CRUSHED  2) COLLEEN NP WILL CONTACT YOU REGARDING DR. Tarri Glenn' RECOMMENDATIONS REGARDING SCHEDULING A SWALLOW STUDY AND UPPER ENDOSCOPY   3) FOLLOW UP WITH DR. Tarri Glenn IN 6 TO 8 WEEKS ( 10-07-2021)   It was a pleasure to see you today!  Thank you for trusting me with your gastrointestinal care!

## 2021-08-28 ENCOUNTER — Encounter: Payer: Self-pay | Admitting: Podiatry

## 2021-08-28 ENCOUNTER — Other Ambulatory Visit (INDEPENDENT_AMBULATORY_CARE_PROVIDER_SITE_OTHER): Payer: 59

## 2021-08-28 ENCOUNTER — Ambulatory Visit (INDEPENDENT_AMBULATORY_CARE_PROVIDER_SITE_OTHER): Payer: 59 | Admitting: Podiatry

## 2021-08-28 DIAGNOSIS — D649 Anemia, unspecified: Secondary | ICD-10-CM | POA: Diagnosis not present

## 2021-08-28 DIAGNOSIS — E119 Type 2 diabetes mellitus without complications: Secondary | ICD-10-CM | POA: Diagnosis not present

## 2021-08-28 DIAGNOSIS — M79674 Pain in right toe(s): Secondary | ICD-10-CM

## 2021-08-28 DIAGNOSIS — B351 Tinea unguium: Secondary | ICD-10-CM

## 2021-08-28 DIAGNOSIS — M79675 Pain in left toe(s): Secondary | ICD-10-CM | POA: Diagnosis not present

## 2021-08-28 LAB — IBC + FERRITIN
Ferritin: 57.4 ng/mL (ref 10.0–291.0)
Iron: 64 ug/dL (ref 42–145)
Saturation Ratios: 16.7 % — ABNORMAL LOW (ref 20.0–50.0)
TIBC: 383.6 ug/dL (ref 250.0–450.0)
Transferrin: 274 mg/dL (ref 212.0–360.0)

## 2021-08-28 LAB — CBC
HCT: 35.1 % — ABNORMAL LOW (ref 36.0–46.0)
Hemoglobin: 11.1 g/dL — ABNORMAL LOW (ref 12.0–15.0)
MCHC: 31.6 g/dL (ref 30.0–36.0)
MCV: 66.7 fl — ABNORMAL LOW (ref 78.0–100.0)
Platelets: 347 10*3/uL (ref 150.0–400.0)
RBC: 5.27 Mil/uL — ABNORMAL HIGH (ref 3.87–5.11)
RDW: 17.3 % — ABNORMAL HIGH (ref 11.5–15.5)
WBC: 8.5 10*3/uL (ref 4.0–10.5)

## 2021-08-28 NOTE — Progress Notes (Signed)
Patient presents for recast of AFO brace.   Patient is being treated for severe flatfoot deformity. Dr. Sherryle Lis recommended AFO braces.  She was casted bilaterally today.   Molds will be sent out to the lab for fabrication.  We will notify the patient once those braces arrive in office and have her back in for fitting.

## 2021-08-29 ENCOUNTER — Encounter (INDEPENDENT_AMBULATORY_CARE_PROVIDER_SITE_OTHER): Payer: Self-pay | Admitting: Pediatric Endocrinology

## 2021-08-29 DIAGNOSIS — N938 Other specified abnormal uterine and vaginal bleeding: Secondary | ICD-10-CM

## 2021-08-31 ENCOUNTER — Other Ambulatory Visit: Payer: Self-pay

## 2021-08-31 ENCOUNTER — Other Ambulatory Visit (INDEPENDENT_AMBULATORY_CARE_PROVIDER_SITE_OTHER): Payer: Self-pay | Admitting: Pharmacist

## 2021-08-31 ENCOUNTER — Telehealth (INDEPENDENT_AMBULATORY_CARE_PROVIDER_SITE_OTHER): Payer: Self-pay | Admitting: Pharmacist

## 2021-08-31 ENCOUNTER — Other Ambulatory Visit (HOSPITAL_BASED_OUTPATIENT_CLINIC_OR_DEPARTMENT_OTHER): Payer: Self-pay

## 2021-08-31 DIAGNOSIS — E11649 Type 2 diabetes mellitus with hypoglycemia without coma: Secondary | ICD-10-CM

## 2021-08-31 DIAGNOSIS — D649 Anemia, unspecified: Secondary | ICD-10-CM

## 2021-08-31 MED ORDER — MOUNJARO 12.5 MG/0.5ML ~~LOC~~ SOAJ
12.5000 mg | SUBCUTANEOUS | 2 refills | Status: DC
  Filled 2021-08-31: qty 2, 28d supply, fill #0
  Filled 2021-09-16 – 2021-09-21 (×2): qty 2, 28d supply, fill #1
  Filled 2021-10-19 – 2021-10-21 (×2): qty 2, 28d supply, fill #2

## 2021-08-31 NOTE — Telephone Encounter (Signed)
Called patient on 08/31/2021 at 11:55 AM   She states Mounjaro 10 mg is on backorder. She reports she has been on Mounjaro 10 mg for the past 2 months and she is ready to increase dose. Her last injection of Mounjaro 10 mg was June 27th 2023. She does not have anymore Mounjaro 10 mg pens.  Asked her about bumps that form after injections. She states it is a bump under the skin the size of about her thumb that last 2-3 days. She reports she rotates injection sites (abdomen, legs, arms). The bump occurs at all areas. She reports she presses pen device hard against her skin; advised her to lightly place Mounjaro pen on skin then press button to administer autoinjection; follow up with me if bumps persist.   Agricultural engineer at [place] at [facility]. She confirms they have Mounjaro 12.5 mg and would be able to get it ready within 1 hour. Sent in prescription for Mounjaro 12.5 mg.  Called patient back to provide update. She requested I send pharmacy address via [place] - done.  Thank you for involving clinical pharmacist/diabetes educator to assist in providing this patient's care.   Drexel Iha, PharmD, BCACP, [place], CPP

## 2021-09-02 ENCOUNTER — Other Ambulatory Visit: Payer: Self-pay | Admitting: Pulmonary Disease

## 2021-09-02 DIAGNOSIS — J301 Allergic rhinitis due to pollen: Secondary | ICD-10-CM

## 2021-09-06 NOTE — Progress Notes (Signed)
  Subjective:  Patient ID: Deanna Levine, female    DOB: 2000-05-10,  MRN: [id number]  Deanna Levine presents to clinic today for preventative diabetic foot care and thick, elongated toenails b/l lower extremities which are tender when wearing enclosed shoe gear.  [name] seeing International aid/development worker for re-casting for one of her braces which were ordered by Dr. Sherryle Lis. [name] followed by Dr. Sherryle Lis for foot/ankle problems.   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  New problem(s): None.   PCP is [name], MD , and last visit was  February 06, 2021  No Known Allergies  Review of Systems: Negative except as noted in the HPI.  Objective: No changes noted in today's physical examination. Deanna Levine is a pleasant 21 y.o. female, obese in NAD. AAO X 3.  Vascular Examination: CFT immediate b/l LE. Palpable DP/PT pulses b/l LE. Digital hair present b/l. Skin temperature gradient WNL b/l. No pain with calf compression b/l. No edema noted b/l. No cyanosis or clubbing noted b/l LE.  Dermatological Examination: Pedal integument with normal turgor, texture and tone BLE. No open wounds b/l LE. Toenails bilateral great toes elongated, discolored, dystrophic, thickened, and crumbly with subungual debris and tenderness to dorsal palpation. No hyperkeratotic nor porokeratotic lesions present on today's visit.  Musculoskeletal Examination: Muscle strength 5/5 to all lower extremity muscle groups bilaterally. Pes planus deformity noted bilateral LE.  Neurological Examination: Protective sensation intact 5/5 intact bilaterally with 10g monofilament b/l. Vibratory sensation intact b/l.     Latest Ref Rng & Units 08/24/2021    2:18 PM 02/12/2021    9:19 AM 11/13/2020    9:51 AM  Hemoglobin A1C  Hemoglobin-A1c <5.7 % of total Hgb 6.3  6.4  6.3    Assessment/Plan: 1. Pain due to onychomycosis of toenails of both feet   2. Type 2 diabetes mellitus  without complication, without long-term current use of insulin (HCC)     -Examined patient. -Re-casted for AFO braces by [facility] today; will call her Mom when they arrive in office. -Continue foot and shoe inspections daily. Monitor blood glucose per PCP/Endocrinologist's recommendations. -Mycotic toenails bilateral great toes were debrided in length and girth with sterile nail nippers and dremel without iatrogenic bleeding. -Patient/POA to call should there be question/concern in the interim.   Return in about 3 months (around 11/28/2021).  Deanna Levine, DPM

## 2021-09-07 ENCOUNTER — Encounter: Payer: Self-pay | Admitting: Occupational Therapy

## 2021-09-07 ENCOUNTER — Ambulatory Visit: Payer: 59 | Attending: Internal Medicine | Admitting: Occupational Therapy

## 2021-09-07 DIAGNOSIS — R208 Other disturbances of skin sensation: Secondary | ICD-10-CM | POA: Insufficient documentation

## 2021-09-07 DIAGNOSIS — M79641 Pain in right hand: Secondary | ICD-10-CM | POA: Diagnosis present

## 2021-09-07 DIAGNOSIS — M79642 Pain in left hand: Secondary | ICD-10-CM | POA: Diagnosis present

## 2021-09-07 DIAGNOSIS — M6281 Muscle weakness (generalized): Secondary | ICD-10-CM | POA: Insufficient documentation

## 2021-09-07 DIAGNOSIS — R278 Other lack of coordination: Secondary | ICD-10-CM | POA: Diagnosis present

## 2021-09-07 NOTE — Progress Notes (Signed)
Deanna Levine, refer to Dr. Tarri Glenn' note below. Pls contact patient and schedule her for an EGD at the hospital with Dr. Tarri Glenn. Pls send request for neuro clearance as her seizure medication was recently adjusted.  Pls let me know when EGD is scheduled. THX!   Per Dr Tarri Glenn:  [place], MD  Noralyn Pick, NP Reviewed and agree with management plans. I would favor EGD for evaluation and diagnosis in the setting of CT findings and underlying IBD.   Kimberly L. Tarri Glenn, MD, MPH

## 2021-09-07 NOTE — Therapy (Signed)
OUTPATIENT OCCUPATIONAL THERAPY ORTHO EVALUATION  Patient Name: Deanna Levine MRN: [id number] DOB:Oct 28, 2000, 21 y.o., female Today's Date: 09/07/2021  PCP: [name], MD REFERRING PROVIDER: Collier Salina, MD    OT End of Session - 09/07/21 [phone]     Visit Number 1    Number of Visits 7    Date for OT Re-Evaluation 11/06/21    Authorization Type [employer]    Authorization - Visit Number 1    Authorization - Number of Visits 31    OT Start Time 0920    OT Stop Time 1000    OT Time Calculation (min) 40 min    Behavior During Therapy [facility] for tasks assessed/performed            Past Medical History:  Diagnosis Date   Absence seizure disorder ([place])    Allergy    Colitis 47/42/5956   Complication of anesthesia    slow to wake up from anesthesia, occ HA with anesthesia    Diabetes mellitus without complication ([place])    type 2   Difficulty swallowing pills    Dyspraxia    Eczema    Global developmental delay    Obesity (BMI 30-39.9)    PONV (postoperative nausea and vomiting)    Precocious female puberty    Seizures ([place])    last seizure 2012 - no current med   Sleep apnea    White-Sutton syndrome    Past Surgical History:  Procedure Laterality Date   ADENOIDECTOMY     BIOPSY  07/16/2021   Procedure: BIOPSY;  Surgeon: [place], MD;  Location: [place];  Service: Gastroenterology;;   bone removal Left 01/16/2021   foot   COLONOSCOPY WITH PROPOFOL N/A 07/16/2021   Procedure: COLONOSCOPY WITH PROPOFOL;  Surgeon: [place] Park, MD;  Location: [place];  Service: Gastroenterology;  Laterality: N/A;   GASTROCNEMIUS RECESSION Bilateral 04/25/2020   Procedure: BILATERAL GASTROCNEMIUS RECESSION;  Surgeon: Newt Minion, MD;  Location: [place];  Service: Orthopedics;  Laterality: Bilateral;   SUPPRELIN IMPLANT  05/06/2011   Procedure: SUPPRELIN IMPLANT;  Surgeon: Jerilynn Mages. Gerald Stabs, MD;  Location: [place];   Service: Pediatrics;  Laterality: Right;   SUPPRELIN IMPLANT Right 06/14/2013   Procedure: REMOVAL OF SUPPRELIN IMPLANT FROM RIGHT UPPER ARM;  Surgeon: Jerilynn Mages. Gerald Stabs, MD;  Location: [place];  Service: Pediatrics;  Laterality: Right;   TONSILLECTOMY     TONSILLECTOMY AND ADENOIDECTOMY     TYMPANOSTOMY TUBE PLACEMENT     x 2   Patient Active Problem List   Diagnosis Date Noted   Acne vulgaris 08/26/2021   Noninfectious gastroenteritis    Bilateral arm pain 06/12/2021   DUB (dysfunctional uterine bleeding) 05/14/2021   Diarrhea 04/24/2021   Dysuria 04/24/2021   Sinusitis 04/24/2021   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   Other adverse food reactions, not elsewhere classified, subsequent encounter 08/20/2020   Other allergic rhinitis 08/20/2020   Allergic conjunctivitis of both eyes 08/20/2020   Encounter for general adult medical examination with abnormal findings 07/03/2020   Leg length discrepancy 06/05/2020   Hidradenitis suppurativa 06/05/2020   Intellectual disability 06/05/2020   Achilles tendon contracture, bilateral    Elevated sedimentation rate 02/07/2020   Pain in right ankle and joints of right foot 02/07/2020   Anemia 12/29/2018   Type 2 diabetes mellitus ([place]) 08/15/2017   Elevated triglycerides with high cholesterol 12/09/2016   Partial epilepsy with impairment of consciousness ([place]) 08/16/2012   Mild intellectual disability  08/16/2012   Laxity of ligament 08/16/2012   Delayed milestones 08/16/2012   Obesity 06/07/2011   Dyspepsia 02/11/2011   Acanthosis nigricans 02/11/2011   Global developmental delay    Petit mal without grand mal seizures ([place])    Dyspraxia     ONSET DATE: 07/07/21 (date of OT order)  REFERRING DIAG: M79.601,M79.602 (ICD-10-CM) - Bilateral arm pain   THERAPY DIAG:  Pain in left hand  Pain in right hand  Muscle weakness (generalized)  Other disturbances of skin sensation  Other lack of coordination  Rationale  for Evaluation and Treatment Rehabilitation  SUBJECTIVE:   SUBJECTIVE STATEMENT: Pt arrives to OP OT evaluation w/ primary concerns related to bilateral hand and arm pain. States she is currently working w/ PT at another location 2x/week for balance and BLE concerns. Pt also reports she was working, but is now on disability due to [employer] syndrome and does not believe she will be able to return to work. C/o difficulty w/ lifting things; was moving bricks around to help her mom yesterday and "my hands really started hurting." Also brought in a written prescription for bilateral carpal tunnel wrist braces from her rheumatologist. Pt accompanied by: self and family member (dad; did not come back to treatment room)  PERTINENT HISTORY: Bilateral arm and hand pain; PMH includes White Sutton syndrome dx Sept 2022 w/ h/o developmental delay, intellectual disability, seizures, and obesity; OSA; DMT2; leg length discrepancy and congenital valgus deformity; bilateral gastrocnemius recession 2/2 achilles tendon contractures Feb 2022; sx of L foot Nov 2022 for calcaneal tarsal coalitions bilaterally  PRECAUTIONS: Fall  WEIGHT BEARING RESTRICTIONS No  PAIN: Are you having pain? Yes: NPRS scale: 7/10 Pain location: both hands Pain description: achy; does report some numbness and tingling Aggravating factors: "working around the house;" worse at night Relieving factors: nothing  FALLS: Has patient fallen in last 6 months? No  LIVING ENVIRONMENT: Lives with: lives with their family Lives in: House/apartment Stairs: Multi-level Has following equipment at home: Single point cane  PLOF: Independent with basic ADLs and Independent with gait  PATIENT GOALS: "Get my hands and arms better"  OBJECTIVE:   HAND DOMINANCE: Left  ADLs: Overall ADLs: Pt reports Mod Ind w/ all BADLs Eating: Needs assist cutting food due to hand pain  FUNCTIONAL OUTCOME MEASURES: Quick Dash: 68.2  UPPER EXTREMITY  ROM: AROM of BUEs WFL (shoulder, elbow, wrist, hand)   UPPER EXTREMITY MMT:     MMT Right Eval - 7/10 Left Eval - 7/10  Shoulder flexion 4+/5 4/5  Shoulder abduction 4+/5 4/5  Shoulder adduction 4+/5 4+/5  Shoulder extension 4+/5 4+/5  Shoulder internal rotation 4-/5 4-/5  Shoulder external rotation 4-/5 4-/5  Elbow flexion 4-/5 4-/5  Elbow extension 4-/5 4-/5  Wrist flexion 4-/5 4-/5  Wrist extension 4-/5 4-/5  Wrist ulnar deviation 4-/5 4-/5  Wrist radial deviation 4-/5 4-/5  (Blank rows = not tested)  HAND FUNCTION: Grip strength: Right: 57 lbs; Left: 43 lbs Lateral pinch: Right: 11.5 lbs, Left 12 lbs 3 point pinch: Right: 8 lbs, Left 8 lbs  COORDINATION: 9 Hole Peg test: Right: 33 sec; Left: 34 sec  SENSATION: WFL Reports occasional numbness and tingling, which is typically worse at night  EDEMA: No observable edema  COGNITION: Overall cognitive status: No family/caregiver present to determine baseline cognitive functioning  OBSERVATIONS: Positive Phalen's test   TODAY'S TREATMENT:  N/A   PATIENT EDUCATION: Educated on role and purpose of OT as well as potential interventions and  goals for therapy based on initial evaluation findings. Introduced condition-specific education and isolated tendon excursion exercises (see HEP below) Person educated: Patient Education method: Consulting civil engineer, Demonstration, and Handouts Education comprehension: verbalized understanding and needs further education   HOME EXERCISE PROGRAM: Isolated tendon excursion exercises (printed handout provided) - Hook fist exercise - Isolated PIPJ flexion/extension of each digit - Isolated DIPJ flexion/extension of each digit - Composite thumb opposition w/ flexion/extension  GOALS: Goals reviewed with patient? Yes  SHORT TERM GOALS: Target date: 09/28/21   STG  Status:  1 Pt will demonstrate understanding of HEP designed for ROM and nerve gliding to improve soft tissue  mobility Baseline: No HEP at this time Initial  2 Pt will verbalize understanding of wear and care of prefabricated/custom fit orthosis for symptom management Baseline: No orthotics at this time Initial  3 Pt will independently verbalize understanding of proper body mechanics and movement precautions for symptom management Baseline: Decreased knowledge of precautions Initial    LONG TERM GOALS: Target date: 11/06/21  LTG  Status:  1 Pt will report overall pain < 4/10 of both hands within the past week by d/c for improved activity tolerance Baseline: Reports consistent pain of 6+/10 Initial  2 Pt will improve QuickDASH score by at least 10 points to indicate decreased impact of BUE pain on functional activities Baseline: 68.2 Initial  3 Pt will demonstrate independence w/ compensatory strategies, including AE prn (built-up handles, ergonomic tools, gloves), to decrease impact of pain on functional tasks Baseline: Decreased knowledge of compensatory techniques Initial    ASSESSMENT:  CLINICAL IMPRESSION: Pt is a 21 y/o who presents to OP OT due to bilateral arm and hand pain. PMH includes White Angela Adam syndrome dx Sept 2022 w/ developmental delay, intellectual disability, seizures, and obesity; h/o seizures; OSA; DMT2; leg length discrepancy and congenital valgus deformity; bilateral gastrocnemius recession 2/2 achilles tendon contractures Feb 2022; sx of L foot Nov 2022 for calcaneal tarsal coalitions bilaterally. Pt currently lives with her parents in a multi-level home and is on disability at this time. Pt will benefit from skilled occupational therapy services to address strength and coordination, ROM, pain management, altered sensation, introduction of compensatory strategies/AE prn, and implementation of an HEP for symptom management and to improve functional use of BUEs during both BADLs and IADLs.  PERFORMANCE DEFICITS in functional skills including coordination, sensation, ROM, strength,  pain, FMC, body mechanics, decreased knowledge of precautions, decreased knowledge of use of DME, and UE functional use.  IMPAIRMENTS are limiting patient from ADLs, IADLs, work, and leisure.   COMORBIDITIES has co-morbidities such as White Sutton syndrome w/ developmental delay, intellectual disability, and h/o seizures   that affects occupational performance. Patient will benefit from skilled OT to address above impairments and improve overall function.  MODIFICATION OR ASSISTANCE TO COMPLETE EVALUATION: No modification of tasks or assist necessary to complete an evaluation.  OT OCCUPATIONAL PROFILE AND HISTORY: Problem focused assessment: Including review of records relating to presenting problem.  CLINICAL DECISION MAKING: LOW - limited treatment options, no task modification necessary  REHAB POTENTIAL: Good  EVALUATION COMPLEXITY: Low    PLAN: OT FREQUENCY: 1-2x/week (2x/week for first 2 weeks, then 1x/week for additional 2 weeks)  OT DURATION: 4 weeks  PLANNED INTERVENTIONS: self care/ADL training, therapeutic exercise, therapeutic activity, manual therapy, splinting, ultrasound, paraffin, fluidotherapy, moist heat, cryotherapy, patient/family education, and DME and/or AE instructions  RECOMMENDED OTHER SERVICES: Currently receiving PT at another facility  CONSULTED AND AGREED WITH PLAN OF CARE: Patient and family  member/caregiver  PLAN FOR NEXT SESSION: Review tendon excursion exercises; introduce median nerve gliding, body mechanics, and AE/tools prn   Kathrine Cords, MSOT, OTR/L 09/07/2021, 11:35 AM

## 2021-09-08 ENCOUNTER — Telehealth: Payer: Self-pay

## 2021-09-08 ENCOUNTER — Other Ambulatory Visit: Payer: Self-pay

## 2021-09-08 DIAGNOSIS — R197 Diarrhea, unspecified: Secondary | ICD-10-CM

## 2021-09-08 DIAGNOSIS — R935 Abnormal findings on diagnostic imaging of other abdominal regions, including retroperitoneum: Secondary | ICD-10-CM

## 2021-09-08 DIAGNOSIS — R109 Unspecified abdominal pain: Secondary | ICD-10-CM

## 2021-09-08 NOTE — Telephone Encounter (Signed)
From the standpoint of seizures, there is no contraindication to medically necessary surgery. The patient should take all seizure medications on time, even if NPO for surgery.

## 2021-09-08 NOTE — Telephone Encounter (Signed)
-----   Message from Noralyn Pick, NP sent at 09/07/2021  3:33 PM EDT -----    ----- Message ----- From: [place] Park, MD Sent: 09/04/2021   2:08 PM EDT To: Noralyn Pick, NP  Reviewed and agree with managementplans. I would favor EGD for evaluation and diagnosis in the setting of CT findings and underlying IBD.   Kimberly L. Tarri Glenn, MD, MPH   ----- Message ----- From: Noralyn Pick, NP Sent: 08/27/2021  12:28 PM EDT To: [place] Park, MD  Dr. Tarri Glenn, pls let me know if you want patient to have a swallow study or EGD. Thx

## 2021-09-08 NOTE — Telephone Encounter (Signed)
Pt was scheduled for an EGD on  11/23/2021 with Dr. Tarri Glenn at [facility] start time 9:30: Case Number [id number]: Pt mom Annetta made aware: Annetta stated that they could not do Dr. Tarri Glenn 10/20/2021 availability at the Hospital due to being out of town that week:  Prep instructions were sent to pt My Chart. Pt mom made aware:  Request for Neurology clearance sent to Pt neurologist Dr. Ellouise Newer  though phone note in Epic: Ambulatory GI referral Placed.  Annetta  verbalized understanding with all questions answered.

## 2021-09-08 NOTE — Telephone Encounter (Signed)
Pt mom Annetta  notified of Dr. Delice Lesch message. Annetta states that this will not interfere with her medication times:

## 2021-09-08 NOTE — Telephone Encounter (Signed)
[facility] Pre-operative Risk Assessment     Sammantha Mehlhaff 01/26/2001 [id number]  Procedure: EGD Anesthesia type:  MAC Procedure Date: 11/23/2021 Provider: Dr. Tarri Glenn  Type of Clearance needed: Medical  Medication(s) needing held:    Length of time for medication to be held:   Please review request and advise by either responding to this message or by sending your response to the fax # provided below.  Thank you,  [facility]  Phone: [phone] Fax: [phone] ATTENTION: Gillermina Hu RN

## 2021-09-10 ENCOUNTER — Ambulatory Visit (INDEPENDENT_AMBULATORY_CARE_PROVIDER_SITE_OTHER): Payer: 59 | Admitting: Podiatry

## 2021-09-10 ENCOUNTER — Encounter: Payer: Self-pay | Admitting: Podiatry

## 2021-09-10 DIAGNOSIS — Q6689 Other  specified congenital deformities of feet: Secondary | ICD-10-CM | POA: Diagnosis not present

## 2021-09-10 DIAGNOSIS — M216X9 Other acquired deformities of unspecified foot: Secondary | ICD-10-CM

## 2021-09-10 DIAGNOSIS — M24573 Contracture, unspecified ankle: Secondary | ICD-10-CM | POA: Diagnosis not present

## 2021-09-10 NOTE — Progress Notes (Signed)
  Subjective:  Patient ID: Deanna Levine, female    DOB: [birth year],  MRN: [id number]  Chief Complaint  Patient presents with   foot care     Foot care     21 y.o. female returns for follow-up of right foot pain.  She says its been painful recently.  Its painful enough that she has been wearing the boot occasionally.  Also has taken some oxycodone here and there when it was really painful.  Review of Systems: Negative except as noted in the HPI. Denies N/V/F/Ch.   Objective:  There were no vitals filed for this visit. There is no height or weight on file to calculate BMI. Constitutional Well developed. Well nourished.  Vascular Foot warm and well perfused. Capillary refill normal to all digits.   Neurologic Normal speech. Oriented to person, place, and time. Epicritic sensation to light touch grossly present bilaterally.  Dermatologic Incisions are well-healed on the left foot  Orthopedic: Today there is no pain in the plantar fascia.  She relates an area of pain around the dorsal medial midfoot over the navicular and cuneiform.  With distraction I am unable to reproduce any of this pain during palpation or manipulation.  She has good 5 out of 5 strength in resisted inversion eversion plantarflexion dorsiflexion   Multiple view plain film radiographs: No new films taken today Assessment:   1. Coalition, calcaneal tarsal   2. Equinus contracture of ankle   3. Acquired forefoot varus, unspecified laterality      Plan:  Patient was evaluated and treated and all questions answered.  Continues to progress as expected.  I suspect most of the pain she is experiencing intermittently is likely related to her coalition and arthritic type pain that is developing.  Short of further surgical intervention with arthrodesis I think this is something that we will have to manage long-term.  I recommend continued nonoperative treatment with physical therapy which she is currently doing and  bracing mobilization.  She has already been casted for her [employer] and hopefully we will have these in the next month or so.  I do not have restrictions for her as far as activity exercise or therapy.  I will see her back in 2 months for follow-up.  Return in about 2 months (around 11/11/2021) for follow up on foot pain / subtalar joint arthritis.

## 2021-09-11 ENCOUNTER — Ambulatory Visit: Payer: 59 | Admitting: Occupational Therapy

## 2021-09-11 ENCOUNTER — Encounter: Payer: Self-pay | Admitting: Occupational Therapy

## 2021-09-11 DIAGNOSIS — M79642 Pain in left hand: Secondary | ICD-10-CM

## 2021-09-11 DIAGNOSIS — R208 Other disturbances of skin sensation: Secondary | ICD-10-CM

## 2021-09-11 DIAGNOSIS — M6281 Muscle weakness (generalized): Secondary | ICD-10-CM

## 2021-09-11 DIAGNOSIS — R278 Other lack of coordination: Secondary | ICD-10-CM

## 2021-09-11 DIAGNOSIS — M79641 Pain in right hand: Secondary | ICD-10-CM

## 2021-09-11 NOTE — Therapy (Signed)
OUTPATIENT OCCUPATIONAL THERAPY TREATMENT NOTE  Patient Name: Deanna Levine MRN: [id number] DOB:August 01, 2000, 21 y.o., female Today's Date: 09/11/2021  PCP: [name], MD REFERRING PROVIDER: Collier Salina, MD    OT End of Session - 09/11/21 0804     Visit Number 2    Number of Visits 7    Date for OT Re-Evaluation 11/06/21    Authorization Type [employer]    Authorization - Visit Number 2    Authorization - Number of Visits 64    OT Start Time 0801    OT Stop Time [phone]    OT Time Calculation (min) 40 min    Behavior During Therapy [facility] for tasks assessed/performed            Past Medical History:  Diagnosis Date   Absence seizure disorder ([place])    Allergy    Colitis 45/85/9292   Complication of anesthesia    slow to wake up from anesthesia, occ HA with anesthesia    Diabetes mellitus without complication (HCC)    type 2   Difficulty swallowing pills    Dyspraxia    Eczema    Global developmental delay    Obesity (BMI 30-39.9)    PONV (postoperative nausea and vomiting)    Precocious female puberty    Seizures ([place])    last seizure 2012 - no current med   Sleep apnea    White-Sutton syndrome    Past Surgical History:  Procedure Laterality Date   ADENOIDECTOMY     BIOPSY  07/16/2021   Procedure: BIOPSY;  Surgeon: Thornton Park, MD;  Location: [place];  Service: Gastroenterology;;   bone removal Left 01/16/2021   foot   COLONOSCOPY WITH PROPOFOL N/A 07/16/2021   Procedure: COLONOSCOPY WITH PROPOFOL;  Surgeon: Thornton Park, MD;  Location: [place];  Service: Gastroenterology;  Laterality: N/A;   GASTROCNEMIUS RECESSION Bilateral 04/25/2020   Procedure: BILATERAL GASTROCNEMIUS RECESSION;  Surgeon: Newt Minion, MD;  Location: [place];  Service: Orthopedics;  Laterality: Bilateral;   SUPPRELIN IMPLANT  05/06/2011   Procedure: SUPPRELIN IMPLANT;  Surgeon: Jerilynn Mages. Gerald Stabs, MD;  Location: [place];   Service: Pediatrics;  Laterality: Right;   SUPPRELIN IMPLANT Right 06/14/2013   Procedure: REMOVAL OF SUPPRELIN IMPLANT FROM RIGHT UPPER ARM;  Surgeon: Jerilynn Mages. Gerald Stabs, MD;  Location: [place];  Service: Pediatrics;  Laterality: Right;   TONSILLECTOMY     TONSILLECTOMY AND ADENOIDECTOMY     TYMPANOSTOMY TUBE PLACEMENT     x 2   Patient Active Problem List   Diagnosis Date Noted   Acne vulgaris 08/26/2021   Noninfectious gastroenteritis    Bilateral arm pain 06/12/2021   DUB (dysfunctional uterine bleeding) 05/14/2021   Diarrhea 04/24/2021   Dysuria 04/24/2021   Sinusitis 04/24/2021   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   Other adverse food reactions, not elsewhere classified, subsequent encounter 08/20/2020   Other allergic rhinitis 08/20/2020   Allergic conjunctivitis of both eyes 08/20/2020   Encounter for general adult medical examination with abnormal findings 07/03/2020   Leg length discrepancy 06/05/2020   Hidradenitis suppurativa 06/05/2020   Intellectual disability 06/05/2020   Achilles tendon contracture, bilateral    Elevated sedimentation rate 02/07/2020   Pain in right ankle and joints of right foot 02/07/2020   Anemia 12/29/2018   Type 2 diabetes mellitus ([place]) 08/15/2017   Elevated triglycerides with high cholesterol 12/09/2016   Partial epilepsy with impairment of consciousness ([place]) 08/16/2012   Mild intellectual disability  08/16/2012   Laxity of ligament 08/16/2012   Delayed milestones 08/16/2012   Obesity 06/07/2011   Dyspepsia 02/11/2011   Acanthosis nigricans 02/11/2011   Global developmental delay    Petit mal without grand mal seizures ([place])    Dyspraxia     ONSET DATE: 07/07/21 (date of OT order)  REFERRING DIAG: M79.601,M79.602 (ICD-10-CM) - Bilateral arm pain   THERAPY DIAG:  Pain in left hand  Pain in right hand  Muscle weakness (generalized)  Other disturbances of skin sensation  Other lack of coordination  Rationale  for Evaluation and Treatment Rehabilitation  SUBJECTIVE:   SUBJECTIVE STATEMENT: "I like that lotion" Pt accompanied by: self  PAIN: Are you having pain? Yes: NPRS scale: 7/10 Pain location: both hands Pain description: achy; does report some numbness and tingling Aggravating factors: "working around the house;" worse at night Relieving factors: nothing  PERTINENT HISTORY: Bilateral arm and hand pain; PMH includes White Sutton syndrome dx Sept 2022 w/ h/o developmental delay, intellectual disability, seizures, and obesity; OSA; DMT2; leg length discrepancy and congenital valgus deformity; bilateral gastrocnemius recession 2/2 achilles tendon contractures Feb 2022; sx of L foot Nov 2022 for calcaneal tarsal coalitions bilaterally  PRECAUTIONS: Fall  PLOF: Independent with basic ADLs and Independent with gait  PATIENT GOALS: "Get my hands and arms better"  OBJECTIVE:   HAND DOMINANCE: Left  TODAY'S TREATMENT: Hook fist exercise 10x w/ each hand; OT provided modeling, verbal cues, and tactile cues for understanding of positioning w/ OT able to decrease assist w/ repetition AROM of isolated PIPJ flexion/extension 10x for digits 2-5 on both hands. Pt required max facilitation and verbal cues for correct positioning and to fully extend digits each rep; improved w/ increased rep AROM of isolated DIPJ flexion/extension 10x for digits 2-5 on both hands. Notable difficulty achieving DIPJ ROM w/out incorporation of PIPJ, requiring max facilitation, modeling, and verbal cues for correct positioning. Improved w/ repetition. AROM of composite thumb flexion, opposing thumb to each finger then sliding down 5th digit to base of thumb before returning to starting position. Completed 15x w/ each hand Gentle soft tissue mobilization w/ lotion to posterior forearm, just distal to elbow down proximally to wrist in order to facilitate mobility within tissue structures and decrease tension, edema, and pain.  Pt positioned w/ forearm resting on tabletop. Reported mild discomfort to palpation in region of extensor muscle origin at elbow Active stretch for RUE, wrist flexion w/ UE flexed to chest height, elbow extended and forearm pronated, completed x5 slowly, holding end range about 10 sec w/out difficulty Tendon gliding (open hand, hook fist, full fist, MPJ flexion, straight/flat fist) 10x w/ each hand. OT providing mod facilitation of positioning and verbal cues for alignment and pacing; consistent verbal cues required to maintain wrist position in neutral throughout   PATIENT EDUCATION: Reviewed and updated HEP (see above) Person educated: Patient Education method: Explanation, Demonstration, and Handouts Education comprehension: verbalized understanding and needs further education   HOME EXERCISE PROGRAM: Printed handout provided: - Tendon gliding - Wrist flexion stretch w/ arm extended  GOALS: Goals reviewed with patient? Yes  SHORT TERM GOALS: Target date: 09/28/21   STG  Status:  1 Pt will demonstrate understanding of HEP designed for ROM and nerve gliding to improve soft tissue mobility Baseline: No HEP at this time Progressing  2 Pt will verbalize understanding of wear and care of prefabricated/custom fit orthosis for symptom management Baseline: No orthotics at this time Progressing  3 Pt will independently verbalize understanding  of proper body mechanics and movement precautions for symptom management Baseline: Decreased knowledge of precautions Progressing    LONG TERM GOALS: Target date: 11/06/21  LTG  Status:  1 Pt will report overall pain < 4/10 of both hands within the past week by d/c for improved activity tolerance Baseline: Reports consistent pain of 6+/10 Progressing  2 Pt will improve QuickDASH score by at least 10 points to indicate decreased impact of BUE pain on functional activities Baseline: 68.2 Progressing  3 Pt will demonstrate independence w/ compensatory  strategies, including AE prn (built-up handles, ergonomic tools, gloves), to decrease impact of pain on functional tasks Baseline: Decreased knowledge of compensatory techniques Progressing    ASSESSMENT:  CLINICAL IMPRESSION: Pt arrives for first treatment session following initial evaluation 09/07/21. Pt reports compliance w/ HEP at home, stating that some of the exercises cause discomfort. OT reviewed exercises w/ pt demonstrating significant difficulty w/ positioning during isolated tendon excursion w/ PIPJ and then DIPJ flex/ext. Considering this, as well as report of increased pain, OT shifted focus to pain management. Pt tolerated soft tissue massage w/out difficulty and reported decreased pain after. OT then reviewed gentle tendon gliding exercises vs hook-fist exercise for more generalized impact and introduced active wrist flexion stretch due to areas of palpation and reported pattern of pain suggesting extensor tendon involvement. Pt negative for ulnar nerve compression provacative screen. Will continue to assess.  PERFORMANCE DEFICITS in functional skills including coordination, sensation, ROM, strength, pain, FMC, body mechanics, decreased knowledge of precautions, decreased knowledge of use of DME, and UE functional use.  IMPAIRMENTS are limiting patient from ADLs, IADLs, work, and leisure.   COMORBIDITIES has co-morbidities such as White Sutton syndrome w/ developmental delay, intellectual disability, and h/o seizures   that affects occupational performance. Patient will benefit from skilled OT to address above impairments and improve overall function.   PLAN: OT FREQUENCY: 1-2x/week (2x/week for first 2 weeks, then 1x/week for additional 2 weeks)  OT DURATION: 4 weeks  PLANNED INTERVENTIONS: self care/ADL training, therapeutic exercise, therapeutic activity, manual therapy, splinting, ultrasound, paraffin, fluidotherapy, moist heat, cryotherapy, patient/family education, and DME  and/or AE instructions  RECOMMENDED OTHER SERVICES: Currently receiving PT at another facility  CONSULTED AND AGREED WITH PLAN OF CARE: Patient and family member/caregiver  PLAN FOR NEXT SESSION: Review HEP exercises; introduce pain management and discuss considerations for body mechanics; introduce AE/tools prn   Kathrine Cords, MSOT, OTR/L 09/11/2021, 1:13 PM

## 2021-09-14 ENCOUNTER — Encounter: Payer: Self-pay | Admitting: Occupational Therapy

## 2021-09-14 ENCOUNTER — Ambulatory Visit: Payer: 59 | Admitting: Occupational Therapy

## 2021-09-14 DIAGNOSIS — M79642 Pain in left hand: Secondary | ICD-10-CM | POA: Diagnosis not present

## 2021-09-14 DIAGNOSIS — R278 Other lack of coordination: Secondary | ICD-10-CM

## 2021-09-14 DIAGNOSIS — M6281 Muscle weakness (generalized): Secondary | ICD-10-CM

## 2021-09-14 DIAGNOSIS — R208 Other disturbances of skin sensation: Secondary | ICD-10-CM

## 2021-09-14 DIAGNOSIS — M79641 Pain in right hand: Secondary | ICD-10-CM

## 2021-09-14 NOTE — Therapy (Signed)
OUTPATIENT OCCUPATIONAL THERAPY TREATMENT NOTE  Patient Name: Deanna Levine MRN: [id number] DOB:October 19, 2000, 21 y.o., female Today's Date: 09/14/2021  PCP: [name], MD REFERRING PROVIDER: Collier Salina, MD    OT End of Session - 09/14/21 0802     Visit Number 3    Number of Visits 7    Date for OT Re-Evaluation 11/06/21    Authorization Type [employer]    Authorization - Visit Number 2    Authorization - Number of Visits 60    OT Start Time 0801    OT Stop Time [phone]    OT Time Calculation (min) 41 min    Activity Tolerance Patient tolerated treatment well    Behavior During Therapy [facility] for tasks assessed/performed            Past Medical History:  Diagnosis Date   Absence seizure disorder ([place])    Allergy    Colitis 03/54/6568   Complication of anesthesia    slow to wake up from anesthesia, occ HA with anesthesia    Diabetes mellitus without complication (HCC)    type 2   Difficulty swallowing pills    Dyspraxia    Eczema    Global developmental delay    Obesity (BMI 30-39.9)    PONV (postoperative nausea and vomiting)    Precocious female puberty    Seizures ([place])    last seizure 2012 - no current med   Sleep apnea    White-Sutton syndrome    Past Surgical History:  Procedure Laterality Date   ADENOIDECTOMY     BIOPSY  07/16/2021   Procedure: BIOPSY;  Surgeon: Thornton Park, MD;  Location: [place];  Service: Gastroenterology;;   bone removal Left 01/16/2021   foot   COLONOSCOPY WITH PROPOFOL N/A 07/16/2021   Procedure: COLONOSCOPY WITH PROPOFOL;  Surgeon: Thornton Park, MD;  Location: [place];  Service: Gastroenterology;  Laterality: N/A;   GASTROCNEMIUS RECESSION Bilateral 04/25/2020   Procedure: BILATERAL GASTROCNEMIUS RECESSION;  Surgeon: Newt Minion, MD;  Location: [place];  Service: Orthopedics;  Laterality: Bilateral;   SUPPRELIN IMPLANT  05/06/2011   Procedure: SUPPRELIN IMPLANT;  Surgeon: Jerilynn Mages. Gerald Stabs, MD;  Location: [place];  Service: Pediatrics;  Laterality: Right;   SUPPRELIN IMPLANT Right 06/14/2013   Procedure: REMOVAL OF SUPPRELIN IMPLANT FROM RIGHT UPPER ARM;  Surgeon: Jerilynn Mages. Gerald Stabs, MD;  Location: [place];  Service: Pediatrics;  Laterality: Right;   TONSILLECTOMY     TONSILLECTOMY AND ADENOIDECTOMY     TYMPANOSTOMY TUBE PLACEMENT     x 2   Patient Active Problem List   Diagnosis Date Noted   Acne vulgaris 08/26/2021   Noninfectious gastroenteritis    Bilateral arm pain 06/12/2021   DUB (dysfunctional uterine bleeding) 05/14/2021   Diarrhea 04/24/2021   Dysuria 04/24/2021   Sinusitis 04/24/2021   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   Other adverse food reactions, not elsewhere classified, subsequent encounter 08/20/2020   Other allergic rhinitis 08/20/2020   Allergic conjunctivitis of both eyes 08/20/2020   Encounter for general adult medical examination with abnormal findings 07/03/2020   Leg length discrepancy 06/05/2020   Hidradenitis suppurativa 06/05/2020   Intellectual disability 06/05/2020   Achilles tendon contracture, bilateral    Elevated sedimentation rate 02/07/2020   Pain in right ankle and joints of right foot 02/07/2020   Anemia 12/29/2018   Type 2 diabetes mellitus ([place]) 08/15/2017   Elevated triglycerides with high cholesterol 12/09/2016   Partial epilepsy with impairment  of consciousness ([place]) 08/16/2012   Mild intellectual disability 08/16/2012   Laxity of ligament 08/16/2012   Delayed milestones 08/16/2012   Obesity 06/07/2011   Dyspepsia 02/11/2011   Acanthosis nigricans 02/11/2011   Global developmental delay    Petit mal without grand mal seizures ([place])    Dyspraxia     ONSET DATE: 07/07/21 (date of OT order)  REFERRING DIAG: M79.601,M79.602 (ICD-10-CM) - Bilateral arm pain   THERAPY DIAG:  Pain in left hand  Pain in right hand  Muscle weakness (generalized)  Other disturbances of skin  sensation  Other lack of coordination  Rationale for Evaluation and Treatment Rehabilitation  SUBJECTIVE:   SUBJECTIVE STATEMENT: Pt reports she has had no pain since completing her exercises on Saturday; feels like the HEP is helpful Pt accompanied by: self  PAIN: Are you having pain? No  PERTINENT HISTORY: Bilateral arm and hand pain; PMH includes White Sutton syndrome dx Sept 2022 w/ h/o developmental delay, intellectual disability, seizures, and obesity; OSA; DMT2; leg length discrepancy and congenital valgus deformity; bilateral gastrocnemius recession 2/2 achilles tendon contractures Feb 2022; sx of L foot Nov 2022 for calcaneal tarsal coalitions bilaterally  PRECAUTIONS: Fall  PLOF: Independent with basic ADLs and Independent with gait  PATIENT GOALS: "Get my hands and arms better"  OBJECTIVE:   HAND DOMINANCE: Left  TODAY'S TREATMENT - 09/14/21: Practiced handwriting using tan built-up handle w/out difficulty or increased pain Simulated cutting of yellow therapy putty using a knife and fork w/ blue built-up handle on fork; OT provided min verbal and tactile cues for appropriate force gradation throughout Tendon gliding (open hand, hook fist, full fist, MPJ flexion, straight/flat fist) 10x w/ R hand to ensure appropriate carryover to home. OT provided verbal cues as reminder to maintain wrist in neutral and for correct positioning w/ hook fist Passive forearm stretches for RUE; reviewing each exercise to include in HEP w/ pt able to return demonstration of all stretches w/out difficulty or increased pain. Held end range about 10 sec:  wrist flexion stretch w/ UE adducted, elbow flexed to 90 and forearm in neutral  wrist flexion w/ UE adducted, elbow flexed to 90 and forearm pronated  wrist flexion w/ UE flexed to chest height, elbow extended, and forearm in neutral  wrist flexion w/ UE flexed to chest height, elbow extended and forearm pronated  Gentle soft tissue  mobilization w/ lotion to posterior forearm, just distal to elbow down proximally to wrist in order to facilitate mobility within tissue structures and decrease tension, edema, and pain. Completed 4 min to each UE w/ no discomfort or adverse reaction.   PATIENT EDUCATION: Education provided on potential benefit of AE for handwriting and using utensils during self-feeding/cutting food; tan built-up handle administered to pt. Also discussed movement precautions for symptom management (e.g., avoiding repetitive grip and/or pinch, lifting w/ palm up, avoiding sustained grip) Person educated: Patient Education method: Explanation, Demonstration, and Handouts Education comprehension: verbalized understanding and needs further education   HOME EXERCISE PROGRAM: Printed handout provided: - Tendon gliding - Wrist flexion stretch w/ arm extended  GOALS: Goals reviewed with patient? Yes  SHORT TERM GOALS: Target date: 09/28/21   STG  Status:  1 Pt will demonstrate understanding of HEP designed for ROM and nerve gliding to improve soft tissue mobility Baseline: No HEP at this time Progressing  2 Pt will verbalize understanding of wear and care of prefabricated/custom fit orthosis for symptom management Baseline: No orthotics at this time Progressing  3 Pt  will independently verbalize understanding of proper body mechanics and movement precautions for symptom management Baseline: Decreased knowledge of precautions Progressing - introduced 09/14/21    LONG TERM GOALS: Target date: 11/06/21  LTG  Status:  1 Pt will report overall pain < 4/10 of both hands within the past week by d/c for improved activity tolerance Baseline: Reports consistent pain of 6+/10 Progressing  2 Pt will improve QuickDASH score by at least 10 points to indicate decreased impact of BUE pain on functional activities Baseline: 68.2 Progressing  3 Pt will demonstrate independence w/ compensatory strategies, including AE prn  (built-up handles, ergonomic tools, gloves), to decrease impact of pain on functional tasks Baseline: Decreased knowledge of compensatory techniques Progressing    ASSESSMENT:  CLINICAL IMPRESSION: Pt arrives to OT session reporting no pain for past 2 days, which is an improvement compared to last week. Due to improvement in pain, OT progressed HEP to include passive stretching, providing corresponding handout for pt to complete active stretches followed by passive stretches. Also educated pt on benefit of incorporating warm pack and/or gentle massage prior to completing exercise. OT also introduced potentially beneficial AE w/ pt demonstrating notable difficulty w/ force gradation of implements used both w/ handwriting and simulated self-feeding. This is likely contributing to forearm pain considering amount of demand placed on affected musculature. OT provided pertinent education regarding this and will continue to monitor.  PERFORMANCE DEFICITS in functional skills including coordination, sensation, ROM, strength, pain, FMC, body mechanics, decreased knowledge of precautions, decreased knowledge of use of DME, and UE functional use.  IMPAIRMENTS are limiting patient from ADLs, IADLs, work, and leisure.   COMORBIDITIES has co-morbidities such as White Sutton syndrome w/ developmental delay, intellectual disability, and h/o seizures   that affects occupational performance. Patient will benefit from skilled OT to address above impairments and improve overall function.   PLAN: OT FREQUENCY: 1-2x/week (2x/week for first 2 weeks, then 1x/week for additional 2 weeks)  OT DURATION: 4 weeks  PLANNED INTERVENTIONS: self care/ADL training, therapeutic exercise, therapeutic activity, manual therapy, splinting, ultrasound, paraffin, fluidotherapy, moist heat, cryotherapy, patient/family education, and DME and/or AE instructions  RECOMMENDED OTHER SERVICES: Currently receiving PT at another  facility  CONSULTED AND AGREED WITH PLAN OF CARE: Patient and family member/caregiver  PLAN FOR NEXT SESSION: Progress to light (1-3 lb strengthening) pt pt continue to report absence of pain. If not, review HEP exercises, continue w/ pain management, and ; introduce pain management and discuss considerations for body mechanics; introduce AE/tools prn   Kathrine Cords, MSOT, OTR/L 09/14/2021, 8:45 AM

## 2021-09-15 ENCOUNTER — Encounter (INDEPENDENT_AMBULATORY_CARE_PROVIDER_SITE_OTHER): Payer: Self-pay | Admitting: Pediatric Genetics

## 2021-09-15 ENCOUNTER — Ambulatory Visit (INDEPENDENT_AMBULATORY_CARE_PROVIDER_SITE_OTHER): Payer: 59 | Admitting: Pediatric Genetics

## 2021-09-15 ENCOUNTER — Ambulatory Visit (INDEPENDENT_AMBULATORY_CARE_PROVIDER_SITE_OTHER): Payer: 59 | Admitting: Orthopedic Surgery

## 2021-09-15 VITALS — Wt 213.6 lb

## 2021-09-15 DIAGNOSIS — R200 Anesthesia of skin: Secondary | ICD-10-CM | POA: Diagnosis not present

## 2021-09-15 DIAGNOSIS — Q8789 Other specified congenital malformation syndromes, not elsewhere classified: Secondary | ICD-10-CM

## 2021-09-15 NOTE — Progress Notes (Unsigned)
MEDICAL GENETICS FOLLOW-UP VISIT  Patient name: Elianna Windom DOB: 16-May-2000 Age: 21 y.o. MRN: [id number]  Initial Referring Provider/Specialty: Lelon Huh, MD / Pediatric Endocrinology Date of Evaluation: 09/15/2021 Chief Complaint/Reason for Referral: White-Sutton syndrome  HPI: Pranathi Winfree is a 21 y.o. female who presents today for follow-up with Genetics pertaining to her known diagnosis of White-Sutton syndrome. [name] accompanied by her mother and father at today's visit.  To review, their initial visit was on 07/30/2021 to establish care. We discussed many psychosocial concerns that the parents have and also learned about Zyanya's medical history and current concerns.  Since that visit, Anira continues to have foot pain that is greatly limiting her ability to work or exercise. There is a plan for braces. They are considering foot surgery, but trying to avoid given the prolonged associated recovery. She has carpal tunnel and recently started occupational therapy. Kyri has an EGD planned for 11/23/21. She will also be seeing a gynecologist soon due to continued breakthrough bleeding despite nexplanon implant and norethindrone.  Past Medical History: Past Medical History:  Diagnosis Date   Absence seizure disorder ([place])    Allergy    Colitis 91/69/4503   Complication of anesthesia    slow to wake up from anesthesia, occ HA with anesthesia    Diabetes mellitus without complication (HCC)    type 2   Difficulty swallowing pills    Dyspraxia    Eczema    Global developmental delay    Obesity (BMI 30-39.9)    PONV (postoperative nausea and vomiting)    Precocious female puberty    Seizures ([place])    last seizure 2012 - no current med   Sleep apnea    White-Sutton syndrome    Patient Active Problem List   Diagnosis Date Noted   Acne vulgaris 08/26/2021   Noninfectious gastroenteritis    Bilateral arm pain 06/12/2021   DUB (dysfunctional uterine bleeding)  05/14/2021   Diarrhea 04/24/2021   Dysuria 04/24/2021   Sinusitis 04/24/2021   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   Other adverse food reactions, not elsewhere classified, subsequent encounter 08/20/2020   Other allergic rhinitis 08/20/2020   Allergic conjunctivitis of both eyes 08/20/2020   Encounter for general adult medical examination with abnormal findings 07/03/2020   Leg length discrepancy 06/05/2020   Hidradenitis suppurativa 06/05/2020   Intellectual disability 06/05/2020   Achilles tendon contracture, bilateral    Elevated sedimentation rate 02/07/2020   Pain in right ankle and joints of right foot 02/07/2020   Anemia 12/29/2018   Type 2 diabetes mellitus ([place]) 08/15/2017   Elevated triglycerides with high cholesterol 12/09/2016   Partial epilepsy with impairment of consciousness ([place]) 08/16/2012   Mild intellectual disability 08/16/2012   Laxity of ligament 08/16/2012   Delayed milestones 08/16/2012   Obesity 06/07/2011   Dyspepsia 02/11/2011   Acanthosis nigricans 02/11/2011   Global developmental delay    Petit mal without grand mal seizures ([place])    Dyspraxia     Past Surgical History:  Past Surgical History:  Procedure Laterality Date   ADENOIDECTOMY     BIOPSY  07/16/2021   Procedure: BIOPSY;  Surgeon: Thornton Park, MD;  Location: [place];  Service: Gastroenterology;;   bone removal Left 01/16/2021   foot   COLONOSCOPY WITH PROPOFOL N/A 07/16/2021   Procedure: COLONOSCOPY WITH PROPOFOL;  Surgeon: Thornton Park, MD;  Location: [place];  Service: Gastroenterology;  Laterality: N/A;   GASTROCNEMIUS RECESSION Bilateral 04/25/2020   Procedure: BILATERAL GASTROCNEMIUS RECESSION;  Surgeon:  Newt Minion, MD;  Location: [place];  Service: Orthopedics;  Laterality: Bilateral;   SUPPRELIN IMPLANT  05/06/2011   Procedure: SUPPRELIN IMPLANT;  Surgeon: Jerilynn Mages. Gerald Stabs, MD;  Location: [place];  Service: Pediatrics;  Laterality: Right;    SUPPRELIN IMPLANT Right 06/14/2013   Procedure: REMOVAL OF SUPPRELIN IMPLANT FROM RIGHT UPPER ARM;  Surgeon: Jerilynn Mages. Gerald Stabs, MD;  Location: [place];  Service: Pediatrics;  Laterality: Right;   TONSILLECTOMY     TONSILLECTOMY AND ADENOIDECTOMY     TYMPANOSTOMY TUBE PLACEMENT     x 2    Social History: Social History   Social History Narrative   Nylia is graduated. [name] doing average.    She enjoys playing softball and basketball.   Lives with her parents.    Not working.   Left handed   Two story home      Had surgery on her legs in Feb and is physical therapy twice a week.   Left handed   No caffeine    Medications: Current Outpatient Medications on File Prior to Visit  Medication Sig Dispense Refill   acetaminophen (TYLENOL) 160 MG/5ML elixir Take 960 mg by mouth every 4 (four) hours as needed for fever.     azelastine (ASTELIN) 0.1 % nasal spray Place 1 spray into both nostrils 2 (two) times daily. Use in each nostril as directed (Patient taking differently: Place 1 spray into both nostrils 2 (two) times daily as needed for rhinitis. Use in each nostril as directed) 30 mL 12   cetirizine (ZYRTEC) 10 MG tablet Take 10 mg by mouth daily as needed for allergies.     chlorhexidine (HIBICLENS) 4 % external liquid Apply topically daily as needed. 120 mL 0   cholecalciferol (VITAMIN D3) 25 MCG (1000 UNIT) tablet Take 1,000 Units by mouth daily.     Clindamycin-Benzoyl Per, Refr, gel Use as directed up to twice a day 45 g 3   Continuous Blood Gluc Receiver ([place]) [place] 1 each by Does not apply route as directed. Use with G7 sensor to check blood sugars 1 each 1   Continuous Blood Gluc Sensor (DEXCOM G7 SENSOR) MISC Inject 1 Device into the skin as directed. Change sensor every 10 days. 3 each 5   dicyclomine (BENTYL) 20 MG tablet Take a half tablet 4 times daily before meals. 60 tablet 3   etonogestrel (NEXPLANON) 68 MG IMPL implant 1 each (68 mg  total) by Subdermal route once. 1 each 0   fluticasone (FLONASE) 50 MCG/ACT nasal spray SHAKE LIQUID AND USE 2 SPRAYS IN EACH NOSTRIL DAILY 48 g 1   meloxicam (MOBIC) 15 MG tablet Take 15 mg by mouth daily.     Menaquinone-7 (VITAMIN K2) 100 MCG CAPS Take 100 mcg by mouth daily.     mesalamine (LIALDA) 1.2 g EC tablet Take 2 tablets (2.4 g total) by mouth daily with breakfast. 60 tablet 2   Metamucil Fiber CHEW Chew 1 tablet by mouth daily.     Multiple Vitamins-Minerals (MULTIVITAMIN ADULT) CHEW Chew 1 tablet by mouth daily.     Na Sulfate-K Sulfate-Mg Sulf 17.5-3.13-1.6 GM/177ML SOLN See admin instructions.     norethindrone (AYGESTIN) 5 MG tablet Take 1 tablet (5 mg total) by mouth 2 (two) times daily as needed (bleeding). For spotting take 1 tab daily. If flow take 2 tabs daily. If heavy bleeding take 3 tabs daily. When bleeding has stopped you can stop the medication.  180 tablet 3   olopatadine (PATANOL) 0.1 % ophthalmic solution Place 1 drop into both eyes 2 (two) times daily as needed for allergies.     spironolactone (ALDACTONE) 50 MG tablet Take 50 mg by mouth daily.     tirzepatide (MOUNJARO) 12.5 MG/0.5ML Pen Inject 12.5 mg into the skin once a week. 2 mL 2   Zonisamide 100 MG/5ML SUSP Take 58m every night 150 mL 11   sulfamethoxazole-trimethoprim (BACTRIM) 200-40 MG/5ML suspension Take 20 mLs by mouth 2 (two) times daily. (Patient not taking: Reported on 09/15/2021)     No current facility-administered medications on file prior to visit.    Allergies:  No Known Allergies  Immunizations: Up to date  Review of Systems (updates in bold): General: *** Eyes/vision: *** Ears/hearing: *** Dental: *** Respiratory: *** Cardiovascular: *** Gastrointestinal: *** Genitourinary: *** Endocrine: *** Hematologic: *** Immunologic: *** Neurological: *** Psychiatric: *** Musculoskeletal: *** Skin, Hair, Nails: ***  Family History: ***No updates to family history since last  visit  Physical Examination: Weight: *** (***%) Height: *** (***%); mid-parental ***% Head circumference: *** (***%)  Wt 213 lb 9.6 oz (96.9 kg)   LMP 07/17/2021 (Approximate)   BMI 36.66 kg/m   General: *** Head: *** Eyes: ***, ICD *** cm, OCD *** cm, Calculated***/Measured*** IPD *** cm (***%) Nose: *** Lips/Mouth/Teeth: *** Ears: *** Neck: *** Chest: ***, IND *** cm, CC *** cm, IND/CC ratio *** (***%) Heart: *** Lungs: *** Abdomen: *** Genitalia: *** Skin: *** Hair: *** Neurologic: *** Psych***: *** Back/spine: *** Extremities: *** Hands/Feet: ***, ***Normal fingers and nails, ***2 palmar creases bilaterally, ***Normal toes and nails, ***No clinodactyly, syndactyly or polydactyly  Updated Genetic testing: ***  Pertinent New Labs: ***  Pertinent New Imaging/Studies: ***  Assessment: ABritny Rielis a 21y.o. female with ***. Prior genetic testing was significant for ***. Growth parameters show ***. Physical examination notable for ***. Family history is ***.  The genetic explanation of White Sutton syndrome ([facility] was reviewed with AShekia her mother, and her father. DNA is the instruction manual for our body, it tells the body how to grow and develop. It is made of billions of letters that are arranged into genes and chromosomes. We each have 23 pairs of chromosomes with one chromosome from each pair coming from our mother and father, respectively. If there is a misspelling or variant in our DNA, different symptoms and features can occur. White Sutton syndrome is caused by variants in the POGZ gene. It is autosomal dominant, meaning that only one copy of the POGZ gene has to have a variant for symptoms to occur. AChrisandradid not inherit the variant from either parent, but rather it was a new change (de novo) in her. She will have a 50% chance of passing the POGZ variant on to future children.  The symptoms of White Sutton syndrome were discussed with AEmanueland her  family. People with WHSUS can have mild to severe intellectual disability and/or developmental delay (100%)  including speech and language delay or absence of speech (100%) and microcephaly. Some individuals with WHSUS may also have ASD and/ ADHD (45%). Feeding difficulties (52%) and gastrointestinal issues are also sometimes present in [place] including constipation (30%), gastroesophageal reflux (226%, and cyclic vomiting. Some people with WHSUS also experience motor delays (80%) and generalized hypotonia (77%), or muscle weakness. People with WHSUS can also be at risk for obesity. Less common symptoms of WHSUS include epilepsy, ophthalmologic abnormalities, sensorineural hearing impairment, sleep-disordered breathing, congenital diaphragmatic hernia,  congenital renal anomalies, recurrent infections, palate abnormalities, and possibly cardiac abnormalities.  Neurologic, ophthalmologic, audiologic, gastroenterology, renal, cardiovascular, and developmental evaluations are recommended for individuals with WHSUS.  PT, OT, and SLP should be considered for all individuals.  Treatments for epilepsy, behavioral disorders, gastrointestinal issues, sleep disorders, hearing loss, and ophthalmologic abnormalities are standard for these conditions. At this time, Saharah has had ongoing followup with multiple specialists. We do recommend that she undergo audiological evaluation and an echocardiogram sometime in the next year, as hearing and cardiac structure have not previously been assessed.  Different resources were provided to the family, including: [place], [place], [place] and Rec ([employer]), etc.  A copy of these results were provided to the family and will be faxed to PCP***. Results will be uploaded to [place].  Recommendations: Audiology Cardiology Join POGZ registry Resources/family  connections ***  Follow-up in ~4 months to continue discussing surrounding POGZ, resources. Offered annual follow-up as well, but family preferred to come this fall for a check-in.    Heidi Dach, MS, [name] Certified Genetic Counselor  Artist Pais, D.O. Attending Physician Medical Genetics Date: 09/15/2021 Time: ***  Total time spent: *** Time spent includes face to face and non-face to face care for the patient on the date of this encounter (history and physical, genetic counseling, coordination of care, data gathering and/or documentation as outlined)

## 2021-09-15 NOTE — Patient Instructions (Addendum)
At [facility], we are committed to providing exceptional care. You will receive a patient satisfaction survey through text or email regarding your visit today. Your opinion is important to me. Comments are appreciated.  Audiology referral Cardiology referral  We will work on the [facility] registry

## 2021-09-15 NOTE — Progress Notes (Signed)
Office Visit Note   Patient: Deanna Levine           Date of Birth: 2000/05/22           MRN: [id number] Visit Date: 09/15/2021              Requested by: [name], MD [address] [place],  [place] [address] PCP: [name], MD   Assessment & Plan: Visit Diagnoses:  1. Bilateral hand numbness     Plan: The patient describes both poorly localized pain in the upper extremity as well as numbness and paresthesias in both hands.  This seems to be in the median nerve distribution.  She does have some provocative signs consistent with carpal tunnel syndrome.  I will provide her with bilateral wrist braces to be worn mostly at night for her nocturnal symptoms.  I will also refer her to Dr. Ernestina Patches for an EMG/nerve conduction study to further evaluate her symptoms.  I can see her back in the office once the electrodiagnostic studies complete.  Follow-Up Instructions: No follow-ups on file.   Orders:  No orders of the defined types were placed in this encounter.  No orders of the defined types were placed in this encounter.     Procedures: No procedures performed   Clinical Data: No additional findings.   Subjective: Chief Complaint  Patient presents with   Right Hand - Pain   Left Hand - Pain    Is a 21 year old female who presents for follow-up of bilateral wrist hand and wrist pain.  She previously described poorly localized pain that was going from her elbow all the way down into her fingers.  Today she describes more definitive numbness and paresthesias in her fingers.  This seems to be in the median nerve distribution including the thumb, index, middle, and ring finger.  The small finger is never involved.  She describes nocturnal symptoms 3 nights per week and when she wakes up with numbness in her hand.  She describes numbness in the office today.  She has been seen by hand therapy and notes that she has had improvement of her fine motor skills.   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  [name] closely followed by podiatry, neurology, rheumatology, and the GI group for these issues.    Review of Systems   Objective: Vital Signs: LMP 07/17/2021 (Approximate)   Physical Exam  Right Hand Exam   Tenderness  The patient is experiencing no tenderness.   Other  Erythema: absent Sensation: normal Pulse: present  Comments:  Negative Tinel sign.  Positive Phalen and Durkan signs.  5/5 thenar motor strength.    Left Hand Exam   Tenderness  The patient is experiencing no tenderness.   Other  Erythema: absent Sensation: normal Pulse: present  Comments:  Positive Tinel sign.  Positive Phalen and Durkan signs. 5/5 thenar motor strength.       Specialty Comments:  No specialty comments available.  Imaging: No results found.   PMFS History: Patient Active Problem List   Diagnosis Date Noted   Bilateral hand numbness 09/15/2021   Acne vulgaris 08/26/2021   Noninfectious gastroenteritis    Bilateral arm pain 06/12/2021   DUB (dysfunctional uterine bleeding) 05/14/2021   Diarrhea 04/24/2021   Dysuria 04/24/2021   Sinusitis 04/24/2021   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   Other adverse  food reactions, not elsewhere classified, subsequent encounter 08/20/2020   Other allergic rhinitis 08/20/2020   Allergic conjunctivitis of both eyes 08/20/2020   Encounter for general adult medical examination with abnormal findings 07/03/2020   Leg length discrepancy 06/05/2020   Hidradenitis suppurativa 06/05/2020   Intellectual disability 06/05/2020   Achilles tendon contracture, bilateral    Elevated sedimentation rate 02/07/2020   Pain in right ankle and joints of right foot 02/07/2020   Anemia 12/29/2018   Type 2 diabetes mellitus ([place]) 08/15/2017   Elevated triglycerides with  high cholesterol 12/09/2016   Partial epilepsy with impairment of consciousness ([place]) 08/16/2012   Mild intellectual disability 08/16/2012   Laxity of ligament 08/16/2012   Delayed milestones 08/16/2012   Obesity 06/07/2011   Dyspepsia 02/11/2011   Acanthosis nigricans 02/11/2011   Global developmental delay    Petit mal without grand mal seizures ([place])    Dyspraxia    Past Medical History:  Diagnosis Date   Absence seizure disorder ([place])    Allergy    Colitis 86/76/1950   Complication of anesthesia    slow to wake up from anesthesia, occ HA with anesthesia    Diabetes mellitus without complication ([place])    type 2   Difficulty swallowing pills    Dyspraxia    Eczema    Global developmental delay    Obesity (BMI 30-39.9)    PONV (postoperative nausea and vomiting)    Precocious female puberty    Seizures ([place])    last seizure 2012 - no current med   Sleep apnea    White-Sutton syndrome     Family History  Problem Relation Age of Onset   Diabetes Mother        steroid induced; hx. colitis   Anesthesia problems Mother        severe headache lasting 2-3 days   Rheum arthritis Mother    Ulcerative colitis Mother    Fibromyalgia Mother    Henoch-Schonlein purpura Brother        in remission   Hypertension Maternal Grandmother    Hyperlipidemia Maternal Grandmother    Diabetes Maternal Grandmother    Kidney disease Maternal Grandfather    Hypertension Maternal Grandfather    Heart disease Maternal Grandfather    Hyperlipidemia Maternal Grandfather    Kidney disease Paternal Grandmother    Hypertension Paternal Grandmother    Diabetes Paternal Grandmother    Heart disease Paternal Grandmother    Hyperlipidemia Paternal Grandmother    Hypertension Paternal Grandfather    Hyperlipidemia Paternal Grandfather    Asthma Maternal Aunt    Seizures Maternal Uncle    Colon cancer Other        great aunt   Esophageal cancer Neg Hx    Esophageal varices Neg Hx     Pancreatic disease Neg Hx     Past Surgical History:  Procedure Laterality Date   ADENOIDECTOMY     BIOPSY  07/16/2021   Procedure: BIOPSY;  Surgeon: Thornton Park, MD;  Location: [place];  Service: Gastroenterology;;   bone removal Left 01/16/2021   foot   COLONOSCOPY WITH PROPOFOL N/A 07/16/2021   Procedure: COLONOSCOPY WITH PROPOFOL;  Surgeon: Thornton Park, MD;  Location: [place];  Service: Gastroenterology;  Laterality: N/A;   GASTROCNEMIUS RECESSION Bilateral 04/25/2020   Procedure: BILATERAL GASTROCNEMIUS RECESSION;  Surgeon: Newt Minion, MD;  Location: [place];  Service: Orthopedics;  Laterality: Bilateral;   SUPPRELIN IMPLANT  05/06/2011   Procedure: SUPPRELIN IMPLANT;  Surgeon: Jerilynn Mages. [employer]  [name], MD;  Location: [place];  Service: Pediatrics;  Laterality: Right;   SUPPRELIN IMPLANT Right 06/14/2013   Procedure: REMOVAL OF SUPPRELIN IMPLANT FROM RIGHT UPPER ARM;  Surgeon: Jerilynn Mages. Gerald Stabs, MD;  Location: [place];  Service: Pediatrics;  Laterality: Right;   TONSILLECTOMY     TONSILLECTOMY AND ADENOIDECTOMY     TYMPANOSTOMY TUBE PLACEMENT     x 2   Social History   Occupational History   Occupation: [employer]  Tobacco Use   Smoking status: Never    Passive exposure: Never   Smokeless tobacco: Never  Vaping Use   Vaping Use: Never used  Substance and Sexual Activity   Alcohol use: No    Alcohol/week: 0.0 standard drinks of alcohol   Drug use: No   Sexual activity: Never    Birth control/protection: None

## 2021-09-16 ENCOUNTER — Other Ambulatory Visit (HOSPITAL_BASED_OUTPATIENT_CLINIC_OR_DEPARTMENT_OTHER): Payer: Self-pay

## 2021-09-17 ENCOUNTER — Encounter: Payer: Self-pay | Admitting: Occupational Therapy

## 2021-09-17 ENCOUNTER — Ambulatory Visit: Payer: 59 | Admitting: Occupational Therapy

## 2021-09-17 DIAGNOSIS — M6281 Muscle weakness (generalized): Secondary | ICD-10-CM

## 2021-09-17 DIAGNOSIS — R278 Other lack of coordination: Secondary | ICD-10-CM

## 2021-09-17 DIAGNOSIS — M79642 Pain in left hand: Secondary | ICD-10-CM | POA: Diagnosis not present

## 2021-09-17 DIAGNOSIS — R208 Other disturbances of skin sensation: Secondary | ICD-10-CM

## 2021-09-17 DIAGNOSIS — M79641 Pain in right hand: Secondary | ICD-10-CM

## 2021-09-17 NOTE — Therapy (Signed)
OUTPATIENT OCCUPATIONAL THERAPY TREATMENT NOTE  Patient Name: Deanna Levine MRN: [id number] DOB:2001-01-25, 21 y.o., female Today's Date: 09/17/2021  PCP: [name], MD REFERRING PROVIDER: Collier Salina, MD    OT End of Session - 09/17/21 [phone]     Visit Number 4    Number of Visits 7    Date for OT Re-Evaluation 11/06/21    Authorization Type [employer]    Authorization - Visit Number 4   Authorization - Number of Visits 60    OT Start Time 0802    OT Stop Time [phone]    OT Time Calculation (min) 34 min    Activity Tolerance Patient tolerated treatment well    Behavior During Therapy [facility] for tasks assessed/performed            Past Medical History:  Diagnosis Date   Absence seizure disorder ([place])    Allergy    Colitis 58/83/2549   Complication of anesthesia    slow to wake up from anesthesia, occ HA with anesthesia    Diabetes mellitus without complication (HCC)    type 2   Difficulty swallowing pills    Dyspraxia    Eczema    Global developmental delay    Obesity (BMI 30-39.9)    PONV (postoperative nausea and vomiting)    Precocious female puberty    Seizures ([place])    last seizure 2012 - no current med   Sleep apnea    White-Sutton syndrome    Past Surgical History:  Procedure Laterality Date   ADENOIDECTOMY     BIOPSY  07/16/2021   Procedure: BIOPSY;  Surgeon: Thornton Park, MD;  Location: [place];  Service: Gastroenterology;;   bone removal Left 01/16/2021   foot   COLONOSCOPY WITH PROPOFOL N/A 07/16/2021   Procedure: COLONOSCOPY WITH PROPOFOL;  Surgeon: Thornton Park, MD;  Location: [place];  Service: Gastroenterology;  Laterality: N/A;   GASTROCNEMIUS RECESSION Bilateral 04/25/2020   Procedure: BILATERAL GASTROCNEMIUS RECESSION;  Surgeon: Newt Minion, MD;  Location: [place];  Service: Orthopedics;  Laterality: Bilateral;   SUPPRELIN IMPLANT  05/06/2011   Procedure: SUPPRELIN IMPLANT;  Surgeon: Jerilynn Mages. Gerald Stabs, MD;  Location: [place];  Service: Pediatrics;  Laterality: Right;   SUPPRELIN IMPLANT Right 06/14/2013   Procedure: REMOVAL OF SUPPRELIN IMPLANT FROM RIGHT UPPER ARM;  Surgeon: Jerilynn Mages. Gerald Stabs, MD;  Location: [place];  Service: Pediatrics;  Laterality: Right;   TONSILLECTOMY     TONSILLECTOMY AND ADENOIDECTOMY     TYMPANOSTOMY TUBE PLACEMENT     x 2   Patient Active Problem List   Diagnosis Date Noted   Bilateral hand numbness 09/15/2021   Acne vulgaris 08/26/2021   Noninfectious gastroenteritis    Bilateral arm pain 06/12/2021   DUB (dysfunctional uterine bleeding) 05/14/2021   Diarrhea 04/24/2021   Dysuria 04/24/2021   Sinusitis 04/24/2021   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   Other adverse food reactions, not elsewhere classified, subsequent encounter 08/20/2020   Other allergic rhinitis 08/20/2020   Allergic conjunctivitis of both eyes 08/20/2020   Encounter for general adult medical examination with abnormal findings 07/03/2020   Leg length discrepancy 06/05/2020   Hidradenitis suppurativa 06/05/2020   Intellectual disability 06/05/2020   Achilles tendon contracture, bilateral    Elevated sedimentation rate 02/07/2020   Pain in right ankle and joints of right foot 02/07/2020   Anemia 12/29/2018   Type 2 diabetes mellitus ([place]) 08/15/2017   Elevated triglycerides with high cholesterol 12/09/2016  Partial epilepsy with impairment of consciousness ([place]) 08/16/2012   Mild intellectual disability 08/16/2012   Laxity of ligament 08/16/2012   Delayed milestones 08/16/2012   Obesity 06/07/2011   Dyspepsia 02/11/2011   Acanthosis nigricans 02/11/2011   Global developmental delay    Petit mal without grand mal seizures ([place])    Dyspraxia     ONSET DATE: 07/07/21 (date of OT order)  REFERRING DIAG: M79.601,M79.602 (ICD-10-CM) - Bilateral arm pain   THERAPY DIAG:  Pain in left hand  Pain in right hand  Muscle weakness  (generalized)  Other disturbances of skin sensation  Other lack of coordination  Rationale for Evaluation and Treatment Rehabilitation  SUBJECTIVE:   SUBJECTIVE STATEMENT: Pt states she has not had any pain, numbness or tingling in the past week. Pt also reports the follow-up appt w/ her appt w/ Dr. Tempie Donning went well Tuesday and she will be getting a nerve conduction study. Pt accompanied by: self  PAIN: Are you having pain? No  PERTINENT HISTORY: Bilateral arm and hand pain; PMH includes White Sutton syndrome dx Sept 2022 w/ h/o developmental delay, intellectual disability, seizures, and obesity; OSA; DMT2; leg length discrepancy and congenital valgus deformity; bilateral gastrocnemius recession 2/2 achilles tendon contractures Feb 2022; sx of L foot Nov 2022 for calcaneal tarsal coalitions bilaterally  PRECAUTIONS: Fall  PLOF: Independent with basic ADLs and Independent with gait  PATIENT GOALS: "Get my hands and arms better"  OBJECTIVE:   HAND DOMINANCE: Left  TODAY'S TREATMENT - 09/17/21:  Isolated Tendon Excursion Exercises  AROM of DIPJ flex/ext w/ MCPJ blocked in neutral to target FDS mobility; completed x10 w/ each digit for both hands. OT provided initial demonstration, modeling, and verbal cues prn for alignment and positioning  AROM of PIPJ flex/ext w/ MCPJ and DIPJs blocked to target FDP mobility; completed x10 w/ each digit for both hands. OT provided mod verbal cues and assist w/ hand repositioning for good technique  AROM of composite thumb flexion completed 10x each w/out difficulty  Light Strengthening Wrist ext/flex w/ 1 lb dumbbell in gravity minimized position; completed 2x10 w/ each UE w/out pain  Forearm sup/pro w/ 1 lb dumbbell w/ elbow flexed to 90 degrees; completed 2x10 w/ each UE w/out pain  Manual Therapy Gentle soft tissue mobilization w/ lotion to posterior forearm, just distal to elbow down proximally to wrist in order to facilitate mobility  within tissue structures and decrease tension, edema, and pain. Completed 4 min to each UE w/ no discomfort or adverse reaction.    PATIENT EDUCATION: Reviewed current HEP including tendon gliding and active/passive stretches for posterior forearm Person educated: Patient Education method: Explanation, Demonstration, and Handouts Education comprehension: verbalized understanding and needs further education   HOME EXERCISE PROGRAM: Printed handout provided: - Tendon gliding - Wrist flexion stretch w/ arm extended - Isolated PIPJ and DIPJ flex/ext w/ blocking of other digits  GOALS: Goals reviewed with patient? Yes  SHORT TERM GOALS: Target date: 09/28/21   STG  Status:  1 Pt will demonstrate understanding of HEP designed for ROM and nerve gliding to improve soft tissue mobility Baseline: No HEP at this time Met - 09/17/21  2 Pt will verbalize understanding of wear and care of prefabricated/custom fit orthosis for symptom management Baseline: No orthotics at this time Met - 09/17/21  3 Pt will independently verbalize understanding of proper body mechanics and movement precautions for symptom management Baseline: Decreased knowledge of precautions Progressing - introduced 09/14/21    LONG TERM GOALS: Target  date: 11/06/21  LTG  Status:  1 Pt will report overall pain < 4/10 of both hands within the past week by d/c for improved activity tolerance Baseline: Reports consistent pain of 6+/10 Progressing  2 Pt will improve QuickDASH score by at least 10 points to indicate decreased impact of BUE pain on functional activities Baseline: 68.2 Progressing  3 Pt will demonstrate independence w/ compensatory strategies, including AE prn (built-up handles, ergonomic tools, gloves), to decrease impact of pain on functional tasks Baseline: Decreased knowledge of compensatory techniques Progressing    ASSESSMENT:  CLINICAL IMPRESSION: Pt continues to report lack of pain and no difficulty w/  exercises at home. Pt also arrives wearing bil prefabricated wrist cock-up braces which pt is able to don/doff independently. Considering continued report of no pain, OT re-introduced isolated tendon excursion exercises to increase soft tissue mobility at the wrist w/ pt able to complete all exercises w/out aggravation of symptoms; this is an improvement compared to attempts in previous sessions. OT also discussed potential to decrease frequency to 1x/week to allow for continued guided progression of protocol while also allowing for increased independence w/ HEP and continued healing; pt agreeable to plan at this time.  PERFORMANCE DEFICITS in functional skills including coordination, sensation, ROM, strength, pain, FMC, body mechanics, decreased knowledge of precautions, decreased knowledge of use of DME, and UE functional use.  IMPAIRMENTS are limiting patient from ADLs, IADLs, work, and leisure.   COMORBIDITIES has co-morbidities such as White Sutton syndrome w/ developmental delay, intellectual disability, and h/o seizures   that affects occupational performance. Patient will benefit from skilled OT to address above impairments and improve overall function.   PLAN: OT FREQUENCY: 1-2x/week (2x/week for first 2 weeks, then 1x/week for additional 2 weeks)  OT DURATION: 4 weeks  PLANNED INTERVENTIONS: self care/ADL training, therapeutic exercise, therapeutic activity, manual therapy, splinting, ultrasound, paraffin, fluidotherapy, moist heat, cryotherapy, patient/family education, and DME and/or AE instructions  RECOMMENDED OTHER SERVICES: Currently receiving PT at another facility  CONSULTED AND AGREED WITH PLAN OF CARE: Patient and family member/caregiver  PLAN FOR NEXT SESSION: Progress to light (1-3 lb strengthening) pt pt continue to report absence of pain; continue w/ pain management and discuss considerations for body mechanics   Kathrine Cords, MSOT, OTR/L 09/17/2021, 8:47 AM

## 2021-09-21 ENCOUNTER — Ambulatory Visit: Payer: 59 | Admitting: Occupational Therapy

## 2021-09-21 ENCOUNTER — Other Ambulatory Visit (HOSPITAL_BASED_OUTPATIENT_CLINIC_OR_DEPARTMENT_OTHER): Payer: Self-pay

## 2021-09-21 ENCOUNTER — Ambulatory Visit: Payer: 59 | Admitting: Allergy

## 2021-09-22 ENCOUNTER — Encounter: Payer: Self-pay | Admitting: Internal Medicine

## 2021-09-22 DIAGNOSIS — Q8789 Other specified congenital malformation syndromes, not elsewhere classified: Secondary | ICD-10-CM

## 2021-09-24 ENCOUNTER — Encounter: Payer: Self-pay | Admitting: Occupational Therapy

## 2021-09-24 ENCOUNTER — Ambulatory Visit: Payer: 59 | Admitting: Occupational Therapy

## 2021-09-24 ENCOUNTER — Other Ambulatory Visit (HOSPITAL_BASED_OUTPATIENT_CLINIC_OR_DEPARTMENT_OTHER): Payer: Self-pay

## 2021-09-24 DIAGNOSIS — M79642 Pain in left hand: Secondary | ICD-10-CM

## 2021-09-24 DIAGNOSIS — R208 Other disturbances of skin sensation: Secondary | ICD-10-CM

## 2021-09-24 DIAGNOSIS — R278 Other lack of coordination: Secondary | ICD-10-CM

## 2021-09-24 DIAGNOSIS — M6281 Muscle weakness (generalized): Secondary | ICD-10-CM

## 2021-09-24 DIAGNOSIS — M79641 Pain in right hand: Secondary | ICD-10-CM

## 2021-09-24 NOTE — Therapy (Signed)
OUTPATIENT OCCUPATIONAL THERAPY TREATMENT NOTE  Patient Name: Deanna Levine MRN: [id number] DOB:15-Apr-2000, 21 y.o., female Today's Date: 09/24/2021  PCP: [name], MD REFERRING PROVIDER: Collier Salina, MD   OT End of Session - 09/24/21 0804     Visit Number 5    Number of Visits 7    Date for OT Re-Evaluation 11/06/21    Authorization Type [employer]    Authorization - Visit Number 5    Authorization - Number of Visits 60    OT Start Time 0802    OT Stop Time [phone]    OT Time Calculation (min) 40 min    Activity Tolerance Patient tolerated treatment well    Behavior During Therapy [facility] for tasks assessed/performed            Past Medical History:  Diagnosis Date   Absence seizure disorder ([place])    Allergy    Colitis 85/04/7739   Complication of anesthesia    slow to wake up from anesthesia, occ HA with anesthesia    Diabetes mellitus without complication (HCC)    type 2   Difficulty swallowing pills    Dyspraxia    Eczema    Global developmental delay    Obesity (BMI 30-39.9)    PONV (postoperative nausea and vomiting)    Precocious female puberty    Seizures ([place])    last seizure 2012 - no current med   Sleep apnea    White-Sutton syndrome    Past Surgical History:  Procedure Laterality Date   ADENOIDECTOMY     BIOPSY  07/16/2021   Procedure: BIOPSY;  Surgeon: Thornton Park, MD;  Location: [place];  Service: Gastroenterology;;   bone removal Left 01/16/2021   foot   COLONOSCOPY WITH PROPOFOL N/A 07/16/2021   Procedure: COLONOSCOPY WITH PROPOFOL;  Surgeon: Thornton Park, MD;  Location: [place];  Service: Gastroenterology;  Laterality: N/A;   GASTROCNEMIUS RECESSION Bilateral 04/25/2020   Procedure: BILATERAL GASTROCNEMIUS RECESSION;  Surgeon: Newt Minion, MD;  Location: [place];  Service: Orthopedics;  Laterality: Bilateral;   SUPPRELIN IMPLANT  05/06/2011   Procedure: SUPPRELIN IMPLANT;  Surgeon: Jerilynn Mages. Gerald Stabs, MD;  Location: [place];  Service: Pediatrics;  Laterality: Right;   SUPPRELIN IMPLANT Right 06/14/2013   Procedure: REMOVAL OF SUPPRELIN IMPLANT FROM RIGHT UPPER ARM;  Surgeon: Jerilynn Mages. Gerald Stabs, MD;  Location: [place];  Service: Pediatrics;  Laterality: Right;   TONSILLECTOMY     TONSILLECTOMY AND ADENOIDECTOMY     TYMPANOSTOMY TUBE PLACEMENT     x 2   Patient Active Problem List   Diagnosis Date Noted   Bilateral hand numbness 09/15/2021   Acne vulgaris 08/26/2021   Noninfectious gastroenteritis    Bilateral arm pain 06/12/2021   DUB (dysfunctional uterine bleeding) 05/14/2021   Diarrhea 04/24/2021   Dysuria 04/24/2021   Sinusitis 04/24/2021   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   Other adverse food reactions, not elsewhere classified, subsequent encounter 08/20/2020   Other allergic rhinitis 08/20/2020   Allergic conjunctivitis of both eyes 08/20/2020   Encounter for general adult medical examination with abnormal findings 07/03/2020   Leg length discrepancy 06/05/2020   Hidradenitis suppurativa 06/05/2020   Intellectual disability 06/05/2020   Achilles tendon contracture, bilateral    Elevated sedimentation rate 02/07/2020   Pain in right ankle and joints of right foot 02/07/2020   Anemia 12/29/2018   Type 2 diabetes mellitus ([place]) 08/15/2017   Elevated triglycerides with high cholesterol 12/09/2016  Partial epilepsy with impairment of consciousness ([place]) 08/16/2012   Mild intellectual disability 08/16/2012   Laxity of ligament 08/16/2012   Delayed milestones 08/16/2012   Obesity 06/07/2011   Dyspepsia 02/11/2011   Acanthosis nigricans 02/11/2011   Global developmental delay    Petit mal without grand mal seizures ([place])    Dyspraxia     ONSET DATE: 07/07/21 (date of OT order)  REFERRING DIAG: M79.601,M79.602 (ICD-10-CM) - Bilateral arm pain   THERAPY DIAG:  Pain in left hand  Pain in right hand  Muscle weakness  (generalized)  Other disturbances of skin sensation  Other lack of coordination  Rationale for Evaluation and Treatment Rehabilitation  SUBJECTIVE:   SUBJECTIVE STATEMENT: "It hurts. My thumb" Pt accompanied by: self  PAIN: Are you having pain? Yes: NPRS scale: 8/10 Pain location: R thumb Pain description: numbness, pins and needles Aggravating factors: playing video games Relieving factors: has not tried anything  PERTINENT HISTORY: Bilateral arm and hand pain; PMH includes White Sutton syndrome dx Sept 2022 w/ h/o developmental delay, intellectual disability, seizures, and obesity; OSA; DMT2; leg length discrepancy and congenital valgus deformity; bilateral gastrocnemius recession 2/2 achilles tendon contractures Feb 2022; sx of L foot Nov 2022 for calcaneal tarsal coalitions bilaterally  PRECAUTIONS: Fall  PLOF: Independent with basic ADLs and Independent with gait  PATIENT GOALS: "Get my hands and arms better"  OBJECTIVE:   HAND DOMINANCE: Left  TODAY'S TREATMENT - 09/24/21:  Tendon Gliding Tendon gliding (open hand, hook fist, full fist, MPJ flexion, straight/flat fist) 5x w/ R hand to ensure appropriate carryover to home. OT provided verbal cues for correct positioning prn during exercise  Median Nerve Glides Completed 2 gentle median nerve glides 10x each to increase mobility for decreased symptoms; able to complete w/out pain  Isolated Tendon Excursion Exercises  AROM of DIPJ flex/ext w/ MCPJ blocked in neutral to target FDS mobility; completed x10 w/ each digit for both hands. OT provided verbal cues and facilitation of correct positioning to block digits prn, and additional verbal cues for pacing  AROM of PIPJ flex/ext w/ MCPJ and DIPJs blocked to target FDP mobility; completed x10 w/ each digit for both hands. OT provided mod verbal cues and assist w/ hand repositioning for good technique  AROM of composite thumb flexion completed 10x each w/out difficulty  Wrist  Flexion/Extension AROM of wrist flex/ext completed x10 w/out difficulty or increased pain after initial demonstration  Heat Pack Moist heat pack applied to R wrist/forearm for 7 min to facilitate increased soft tissue mobility and decreased pain    PATIENT EDUCATION: Reviewed and updated HEP (see below) Person educated: Patient Education method: Explanation, Demonstration, and Handouts Education comprehension: verbalized understanding and returned demonstration   HOME EXERCISE PROGRAM: Printed handout provided: - Tendon gliding - Wrist flexion stretch w/ arm extended - Isolated PIPJ and DIPJ flex/ext w/ blocking of other digits - Median nerve glides - Wrist flex/ext AROM  GOALS: Goals reviewed with patient? Yes  SHORT TERM GOALS: Target date: 09/28/21   STG  Status:  1 Pt will demonstrate understanding of HEP designed for ROM and nerve gliding to improve soft tissue mobility Baseline: No HEP at this time Met - 09/17/21  2 Pt will verbalize understanding of wear and care of prefabricated/custom fit orthosis for symptom management Baseline: No orthotics at this time Met - 09/17/21  3 Pt will independently verbalize understanding of proper body mechanics and movement precautions for symptom management Baseline: Decreased knowledge of precautions Met - 09/24/21  LONG TERM GOALS: Target date: 11/06/21  LTG  Status:  1 Pt will report overall pain < 4/10 of both hands within the past week by d/c for improved activity tolerance Baseline: Reports consistent pain of 6+/10 Progressing  2 Pt will improve QuickDASH score by at least 10 points to indicate decreased impact of BUE pain on functional activities Baseline: 68.2 Progressing  3 Pt will demonstrate independence w/ compensatory strategies, including AE prn (built-up handles, ergonomic tools, gloves), to decrease impact of pain on functional tasks Baseline: Decreased knowledge of compensatory techniques Progressing     ASSESSMENT:  CLINICAL IMPRESSION: After presenting to OT reporting no pain across multiple sessions, pt arrives today w/ c/o numbness/tingling of her R thumb. Due to this, OT focused session on reviewing body mechanics to prevent aggravation of symptoms, AROM exercises for wrist and hand, and nerve flossing for mobilization. At this time, pt is able to complete exercises w/out pain, benefiting from review of positioning throughout, and is independent w/ wear and care of prefabricated bil orthoses, but OT was not able to introduce light strengthening considering return of symptoms. Continued OT now to be deferred until pt completes EMG/NCS on 10/13/21 to allow for focus on continued compliance w/ HEP at home and consistency w/ body mechanics for decreased symptoms. Pt encouraged to call back before that time with any changes or concerns, or if any relevant functional deficits develop. Progress and POC to be reevaluated at that time; pt currently agreeable to plan.  PERFORMANCE DEFICITS in functional skills including coordination, sensation, ROM, strength, pain, FMC, body mechanics, decreased knowledge of precautions, decreased knowledge of use of DME, and UE functional use.  IMPAIRMENTS are limiting patient from ADLs, IADLs, work, and leisure.   COMORBIDITIES has co-morbidities such as White Sutton syndrome w/ developmental delay, intellectual disability, and h/o seizures   that affects occupational performance. Patient will benefit from skilled OT to address above impairments and improve overall function.   PLAN: OT FREQUENCY: 1-2x/week (2x/week for first 2 weeks, then 1x/week for additional 2 weeks)  OT DURATION: 4 weeks  PLANNED INTERVENTIONS: self care/ADL training, therapeutic exercise, therapeutic activity, manual therapy, splinting, ultrasound, paraffin, fluidotherapy, moist heat, cryotherapy, patient/family education, and DME and/or AE instructions  RECOMMENDED OTHER SERVICES:  Currently receiving PT at another facility  CONSULTED AND AGREED WITH PLAN OF CARE: Patient and family member/caregiver  PLAN FOR NEXT SESSION: Pt to be put on hold after today until after completing EMG/NCS; after that time, may return to therapy to progress to light (1-3 lb strengthening) and continue w/ pain management and considerations for body mechanics   Kathrine Cords, MSOT, OTR/L 09/24/2021, 9:08 AM

## 2021-09-26 NOTE — Progress Notes (Unsigned)
09/28/21- 75 yoF with developmental delay and Allergic Rhinitis, referred for sleep evaluation courtesy of Dr Erin Fulling with concern of snoring and OSA Medical problem list includes Allergic Rhinitis,Allergic Conjunctivitis,  Colitis, DM2, absence Seizure Disorder, Hidradinitis, Obesity, White-Sutton Syndrome,  HST 10/27/20- AHI 5.2/ hr, desaturation to 75%, Body weight 218 lbs -----Consult: Snoring more, has seizure throughout the night.  Epworth score-  Body weight today-211 lbs Covid vax 2 [employer] Here with parents.  Had sleep study due to loud snoring, labored breathing during sleep, witnessed apnea. ENT surgery+tonsils as child. Mother is my patient on CPAP. Given symptoms and parent awareness of issues, it may be most efficient to try treating first with CPAP. Consider updating sleep study later as appropriate.   Prior to Admission medications   Medication Sig Start Date End Date Taking? Authorizing Provider  azelastine (ASTELIN) 0.1 % nasal spray Place 1 spray into both nostrils 2 (two) times daily. Use in each nostril as directed Patient taking differently: Place 1 spray into both nostrils 2 (two) times daily as needed for rhinitis. Use in each nostril as directed 07/16/20  Yes Dewald, Cheryle Horsfall, MD  cetirizine (ZYRTEC) 10 MG tablet Take 10 mg by mouth daily as needed for allergies.   Yes [provider]  fluticasone (FLONASE) 50 MCG/ACT nasal spray SHAKE LIQUID AND USE 2 SPRAYS IN EACH NOSTRIL DAILY 09/03/21  Yes Freddi Starr, MD  acetaminophen (TYLENOL) 160 MG/5ML elixir Take 960 mg by mouth every 4 (four) hours as needed for fever.    [provider]  chlorhexidine (HIBICLENS) 4 % external liquid Apply topically daily as needed. 05/26/20   [name], MD  cholecalciferol (VITAMIN D3) 25 MCG (1000 UNIT) tablet Take 1,000 Units by mouth daily.    [provider]  Clindamycin-Benzoyl Per, Refr, gel Use as directed up to twice a day 08/24/21   Lelon Huh, MD  Continuous Blood Gluc Receiver ([place]) [place] 1 each by Does not apply route as directed. Use with G7 sensor to check blood sugars 05/11/21   Lelon Huh, MD  Continuous Blood Gluc Sensor (DEXCOM G7 SENSOR) MISC Inject 1 Device into the skin as directed. Change sensor every 10 days. 05/05/21   Lelon Huh, MD  dicyclomine (BENTYL) 20 MG tablet Take a half tablet 4 times daily before meals. 05/22/21   Thornton Park, MD  etonogestrel (NEXPLANON) 68 MG IMPL implant 1 each (68 mg total) by Subdermal route once.    Trude Mcburney, FNP  meloxicam (MOBIC) 15 MG tablet Take 15 mg by mouth daily. 04/17/21   [provider]  Menaquinone-7 (VITAMIN K2) 100 MCG CAPS Take 100 mcg by mouth daily.    [provider]  mesalamine (LIALDA) 1.2 g EC tablet Take 2 tablets (2.4 g total) by mouth daily with breakfast. 08/27/21   Noralyn Pick, NP  Metamucil Fiber CHEW Chew 1 tablet by mouth daily.    [provider]  Multiple Vitamins-Minerals (MULTIVITAMIN ADULT) CHEW Chew 1 tablet by mouth daily.    [provider]  Na Sulfate-K Sulfate-Mg Sulf 17.5-3.13-1.6 GM/177ML SOLN See admin instructions. 05/20/21   [provider]  norethindrone (AYGESTIN) 5 MG tablet Take 1 tablet (5 mg total) by mouth 2 (two) times daily as needed (bleeding). For spotting take 1 tab daily. If flow take 2 tabs daily. If heavy bleeding take 3 tabs daily. When bleeding has stopped you can stop the medication. 05/14/21   Lelon Huh, MD  olopatadine (  PATANOL) 0.1 % ophthalmic solution Place 1 drop into both eyes 2 (two) times daily as needed for allergies.    [provider]  spironolactone (ALDACTONE) 50 MG tablet Take 50 mg by mouth daily. 08/01/21   [provider]  sulfamethoxazole-trimethoprim (BACTRIM) 200-40 MG/5ML suspension Take 20 mLs by mouth 2 (two) times daily. Patient not taking: Reported on 09/15/2021    [provider]  tirzepatide [facility]) 12.5 MG/0.5ML Pen Inject 12.5 mg into the skin once a week. 08/31/21   Lelon Huh, MD  Zonisamide 100 MG/5ML SUSP Take 53m every night 08/18/21   ACameron Sprang MD   Past Medical History:  Diagnosis Date   Absence seizure disorder ([facility]    Allergy    Colitis 025/06/3974  Complication of anesthesia    slow to wake up from anesthesia, occ HA with anesthesia    Diabetes mellitus without complication ([facility]    type 2   Difficulty swallowing pills    Dyspraxia    Eczema    Global developmental delay    Obesity (BMI 30-39.9)    PONV (postoperative nausea and vomiting)    Precocious female puberty    Seizures ([facility]    last seizure 2012 - no current med   Sleep apnea    White-Sutton syndrome    Past Surgical History:  Procedure Laterality Date   ADENOIDECTOMY     BIOPSY  07/16/2021   Procedure: BIOPSY;  Surgeon: BThornton Park MD;  [place]  Service: Gastroenterology;;   bone removal Left 01/16/2021   foot   COLONOSCOPY WITH PROPOFOL N/A 07/16/2021   Procedure: COLONOSCOPY WITH PROPOFOL;  Surgeon: BThornton Park MD;  [place] [facility]  Service: Gastroenterology;  Laterality: N/A;   GASTROCNEMIUS RECESSION Bilateral 04/25/2020   Procedure: BILATERAL GASTROCNEMIUS RECESSION;  Surgeon: DNewt Minion MD;  [place] [facility]  Service: Orthopedics;  Laterality: Bilateral;   SUPPRELIN IMPLANT  05/06/2011   Procedure: SUPPRELIN IMPLANT;  Surgeon: MJerilynn Mages SGerald Stabs MD;  [place] [facility]  Service: Pediatrics;  Laterality: Right;   SUPPRELIN IMPLANT Right 06/14/2013   Procedure: REMOVAL OF SUPPRELIN IMPLANT FROM RIGHT UPPER ARM;  Surgeon: MJerilynn Mages SGerald Stabs MD;  [place] [facility]  Service: Pediatrics;  Laterality: Right;   TONSILLECTOMY     TONSILLECTOMY AND ADENOIDECTOMY     TYMPANOSTOMY TUBE PLACEMENT     x 2   Family History  Problem Relation Age of Onset   Diabetes Mother        steroid  induced; hx. colitis   Anesthesia problems Mother        severe headache lasting 2-3 days   Rheum arthritis Mother    Ulcerative colitis Mother    Fibromyalgia Mother    Henoch-Schonlein purpura Brother        in remission   Hypertension Maternal Grandmother    Hyperlipidemia Maternal Grandmother    Diabetes Maternal Grandmother    Kidney disease Maternal Grandfather    Hypertension Maternal Grandfather    Heart disease Maternal Grandfather    Hyperlipidemia Maternal Grandfather    Kidney disease Paternal Grandmother    Hypertension Paternal Grandmother    Diabetes Paternal Grandmother    Heart disease Paternal Grandmother    Hyperlipidemia Paternal Grandmother    Hypertension Paternal Grandfather    Hyperlipidemia Paternal Grandfather    Asthma Maternal Aunt    Seizures Maternal Uncle    Colon cancer Other        great  aunt   Esophageal cancer Neg Hx    Esophageal varices Neg Hx    Pancreatic disease Neg Hx    Social History   Socioeconomic History   Marital status: Single    Spouse name: Not on file   Number of children: 12   Years of education: Not on file   Highest education level: Not on file  Occupational History   Occupation: [employer]  Tobacco Use   Smoking status: Never    Passive exposure: Never   Smokeless tobacco: Never  Vaping Use   Vaping Use: Never used  Substance and Sexual Activity   Alcohol use: No    Alcohol/week: 0.0 standard drinks of alcohol   Drug use: No   Sexual activity: Never    Birth control/protection: None  Other Topics Concern   Not on file  Social History Narrative   Deanna Levine is graduated. [name] doing average.    She enjoys playing softball and basketball.   Lives with her parents.    Not working.   Left handed   Two story home      Had surgery on her legs in Feb and is physical therapy twice a week.   Left handed   No caffeine   Social Determinants of Health   Financial Resource Strain: Not on file  Food  Insecurity: Not on file  Transportation Needs: Not on file  Physical Activity: Not on file  Stress: Not on file  Social Connections: Not on file  Intimate Partner Violence: Not on file   ROS-see HPI   + = positive Constitutional:    weight loss, night sweats, fevers, chills, fatigue, lassitude. HEENT:    headaches, difficulty swallowing, tooth/dental problems, sore throat,       sneezing, itching, ear ache, +nasal congestion, post nasal drip, snoring CV:    +chest pain, orthopnea, PND, +swelling in lower extremities, anasarca,               dizziness, palpitations Resp:   shortness of breath with exertion or at rest.                productive cough,   non-productive cough, coughing up of blood.              change in color of mucus.  wheezing.   Skin:    +rash or lesions. GI:  No-   heartburn, indigestion, +abdominal pain, nausea, vomiting, diarrhea,                 change in bowel habits, loss of appetite GU: dysuria, change in color of urine, no urgency or frequency.   flank pain. MS:   +joint pain, stiffness, decreased range of motion, back pain. Neuro-    HPI Psych:  change in mood or affect.  depression or +anxiety.   memory loss.  OBJ- Physical Exam    reported White-Sutton Synd.             +overweight General- +Alert,/ pleasant/ responsive,  Oriented, Affect-appropriate, Distress- none acute Skin- rash-none, lesions- none, excoriation- none Lymphadenopathy- none Head- atraumatic            Eyes- Gross vision intact, PERRLA, conjunctivae and secretions clear            Ears- Hearing, canals-normal            Nose- Clear, no-Septal dev, mucus, polyps, erosion, perforation             Throat- Mallampati IV ,  mucosa clear , drainage- none, tonsils-absent, +teeth Neck- flexible , trachea midline, no stridor , thyroid nl, carotid no bruit Chest - symmetrical excursion , unlabored           Heart/CV- RRR , no murmur , no gallop  , no rub, nl s1 s2                           - JVD-  none , edema- none, stasis changes- none, varices- none           Lung- clear to P&A, wheeze- none, cough- none , dullness-none, rub- none           Chest wall-  Abd-  Br/ Gen/ Rectal- Not done, not indicated Extrem- cyanosis- none, clubbing, none, atrophy- none, strength- nl Neuro- grossly intact to observation

## 2021-09-28 ENCOUNTER — Encounter: Payer: Self-pay | Admitting: Internal Medicine

## 2021-09-28 ENCOUNTER — Ambulatory Visit (INDEPENDENT_AMBULATORY_CARE_PROVIDER_SITE_OTHER): Payer: 59 | Admitting: Internal Medicine

## 2021-09-28 DIAGNOSIS — G4733 Obstructive sleep apnea (adult) (pediatric): Secondary | ICD-10-CM | POA: Diagnosis not present

## 2021-09-28 DIAGNOSIS — G40A09 Absence epileptic syndrome, not intractable, without status epilepticus: Secondary | ICD-10-CM | POA: Diagnosis not present

## 2021-09-28 NOTE — Patient Instructions (Signed)
Order- new DME [employer] (parents both use this one). New CPAP auto 5-15, mask of choice, humidifier, supplies, AirView/ card

## 2021-09-30 ENCOUNTER — Encounter: Payer: Self-pay | Admitting: Internal Medicine

## 2021-09-30 DIAGNOSIS — G4733 Obstructive sleep apnea (adult) (pediatric): Secondary | ICD-10-CM | POA: Insufficient documentation

## 2021-09-30 NOTE — Assessment & Plan Note (Signed)
Seizures managed by Neurology.  I don't see expressed concern that sleep apnea may be aggravating.

## 2021-09-30 NOTE — Assessment & Plan Note (Addendum)
Minimal OSA when she weighed more. Long palate, overweight and reported loud snore, witnessed apnea. Parents are familiar with OSA. Not clear if there is concern for seizure control. Plan- after discussion of options, we will try CPAP. If necessary we will update sleep study.

## 2021-10-01 ENCOUNTER — Encounter (INDEPENDENT_AMBULATORY_CARE_PROVIDER_SITE_OTHER): Payer: Self-pay

## 2021-10-02 ENCOUNTER — Encounter: Payer: Self-pay | Admitting: Internal Medicine

## 2021-10-02 DIAGNOSIS — G4733 Obstructive sleep apnea (adult) (pediatric): Secondary | ICD-10-CM

## 2021-10-07 ENCOUNTER — Encounter: Payer: Self-pay | Admitting: Gastroenterology

## 2021-10-07 ENCOUNTER — Ambulatory Visit (INDEPENDENT_AMBULATORY_CARE_PROVIDER_SITE_OTHER): Payer: 59 | Admitting: Obstetrics and Gynecology

## 2021-10-07 ENCOUNTER — Ambulatory Visit (INDEPENDENT_AMBULATORY_CARE_PROVIDER_SITE_OTHER): Payer: 59 | Admitting: Gastroenterology

## 2021-10-07 ENCOUNTER — Other Ambulatory Visit: Payer: Self-pay

## 2021-10-07 ENCOUNTER — Other Ambulatory Visit: Payer: 59

## 2021-10-07 ENCOUNTER — Encounter: Payer: Self-pay | Admitting: Obstetrics and Gynecology

## 2021-10-07 VITALS — BP 126/78 | HR 98 | Ht 64.0 in | Wt 212.4 lb

## 2021-10-07 VITALS — BP 115/76 | HR 98 | Wt 212.0 lb

## 2021-10-07 DIAGNOSIS — Z975 Presence of (intrauterine) contraceptive device: Secondary | ICD-10-CM | POA: Diagnosis not present

## 2021-10-07 DIAGNOSIS — K5190 Ulcerative colitis, unspecified, without complications: Secondary | ICD-10-CM

## 2021-10-07 DIAGNOSIS — R197 Diarrhea, unspecified: Secondary | ICD-10-CM | POA: Diagnosis not present

## 2021-10-07 DIAGNOSIS — N939 Abnormal uterine and vaginal bleeding, unspecified: Secondary | ICD-10-CM | POA: Diagnosis not present

## 2021-10-07 DIAGNOSIS — Z124 Encounter for screening for malignant neoplasm of cervix: Secondary | ICD-10-CM

## 2021-10-07 DIAGNOSIS — L732 Hidradenitis suppurativa: Secondary | ICD-10-CM | POA: Diagnosis not present

## 2021-10-07 MED ORDER — PENTASA 250 MG PO CPCR: 1000.0000 mg | ORAL_CAPSULE | Freq: Three times a day (TID) | ORAL | 5 refills | Status: DC

## 2021-10-07 NOTE — Progress Notes (Signed)
Obstetrics and Gynecology New Patient Evaluation  Appointment Date: 10/07/2021  OBGYN Clinic: [facility]  Primary Care Provider: Pricilla Holm Levine  Referring Provider: Lelon Huh, MD  Chief Complaint: breast bumps, nexplanon and AUB, pap smear History of Present Illness: Deanna Levine is Levine 21 y.o. G0 with above CC  Breast bumps: history of them in the past and has had these for about the past month or two. Patient states PCP recommended GYN evaluate  Nexplanon: placed to help with history of heavy periods and b/c she was maybe going to start being sexually active Levine few years ago but pt never became sexually active and has never been sexually active. Current nexplanon in place for about the past two years. Periods were qmonth and regular, approximately one month with it but when she started [facility] late last year she said her periods now last about two weeks. PCP tried bid Aygestin Levine few months ago but no help per patient and mom   Pap smear: patient has never had Levine pap smear before  Review of Systems: Pertinent items noted in HPI and remainder of comprehensive ROS otherwise negative.    Patient Active Problem List   Diagnosis Date Noted   OSA (obstructive sleep apnea) 09/30/2021   Bilateral hand numbness 09/15/2021   Acne vulgaris 08/26/2021   Noninfectious gastroenteritis    Bilateral arm pain 06/12/2021   DUB (dysfunctional uterine bleeding) 05/14/2021   Diarrhea 04/24/2021   Dysuria 04/24/2021   Sinusitis 04/24/2021   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   Other adverse food reactions, not elsewhere classified, subsequent encounter 08/20/2020   Other allergic rhinitis 08/20/2020   Allergic conjunctivitis of both eyes 08/20/2020   Encounter for general adult medical examination with abnormal findings 07/03/2020   Leg length discrepancy 06/05/2020   Hidradenitis suppurativa 06/05/2020   Intellectual disability 06/05/2020    Achilles tendon contracture, bilateral    Elevated sedimentation rate 02/07/2020   Pain in right ankle and joints of right foot 02/07/2020   Anemia 12/29/2018   Type 2 diabetes mellitus ([place]) 08/15/2017   Elevated triglycerides with high cholesterol 12/09/2016   Partial epilepsy with impairment of consciousness ([place]) 08/16/2012   Mild intellectual disability 08/16/2012   Laxity of ligament 08/16/2012   Delayed milestones 08/16/2012   Obesity 06/07/2011   Dyspepsia 02/11/2011   Acanthosis nigricans 02/11/2011   Global developmental delay    Petit mal without grand mal seizures ([place])    Dyspraxia    Past Medical History:  Past Medical History:  Diagnosis Date   Absence seizure disorder ([place])    Allergy    Colitis 71/24/5809   Complication of anesthesia    slow to wake up from anesthesia, occ HA with anesthesia    Diabetes mellitus without complication ([place])    type 2   Difficulty swallowing pills    Dyspraxia    Eczema    Global developmental delay    Obesity (BMI 30-39.9)    PONV (postoperative nausea and vomiting)    Precocious female puberty    Seizures ([place])    last seizure 2012 - no current med   Sleep apnea    White-Sutton syndrome     Past Surgical History:  Past Surgical History:  Procedure Laterality Date   ADENOIDECTOMY     BIOPSY  07/16/2021   Procedure: BIOPSY;  Surgeon: Thornton Park, MD;  Location: [place];  Service: Gastroenterology;;   bone removal Left 01/16/2021   foot   COLONOSCOPY WITH PROPOFOL  N/Levine 07/16/2021   Procedure: COLONOSCOPY WITH PROPOFOL;  Surgeon: Thornton Park, MD;  Location: [place];  Service: Gastroenterology;  Laterality: N/Levine;   GASTROCNEMIUS RECESSION Bilateral 04/25/2020   Procedure: BILATERAL GASTROCNEMIUS RECESSION;  Surgeon: Newt Minion, MD;  Location: [place];  Service: Orthopedics;  Laterality: Bilateral;   SUPPRELIN IMPLANT  05/06/2011   Procedure: SUPPRELIN IMPLANT;  Surgeon: Jerilynn Mages. Gerald Stabs, MD;   Location: [place];  Service: Pediatrics;  Laterality: Right;   SUPPRELIN IMPLANT Right 06/14/2013   Procedure: REMOVAL OF SUPPRELIN IMPLANT FROM RIGHT UPPER ARM;  Surgeon: Jerilynn Mages. Gerald Stabs, MD;  Location: [place];  Service: Pediatrics;  Laterality: Right;   TONSILLECTOMY     TONSILLECTOMY AND ADENOIDECTOMY     TYMPANOSTOMY TUBE PLACEMENT     x 2    Past Obstetrical History:  OB History  Gravida Para Term Preterm AB Living  0 0 0 0 0 0  SAB IAB Ectopic Multiple Live Births  0 0 0 0 0    Past Gynecological History: As per HPI.  Social History:  Social History   Socioeconomic History   Marital status: Single    Spouse name: Not on file   Number of children: 0   Years of education: 12   Highest education level: Not on file  Occupational History   Occupation: [employer]  Tobacco Use   Smoking status: Never    Passive exposure: Never   Smokeless tobacco: Never  Vaping Use   Vaping Use: Never used  Substance and Sexual Activity   Alcohol use: No    Alcohol/week: 0.0 standard drinks of alcohol   Drug use: No   Sexual activity: Never    Birth control/protection: None  Other Topics Concern   Not on file  Social History Narrative   [name] is graduated. [name] doing average.    She enjoys playing softball and basketball.   Lives with her parents.    Not working.   Left handed   Two story home      Had surgery on her legs in Feb and is physical therapy twice Levine week.   Left handed   No caffeine   Social Determinants of Health   Financial Resource Strain: Not on file  Food Insecurity: Not on file  Transportation Needs: Not on file  Physical Activity: Not on file  Stress: Not on file  Social Connections: Not on file  Intimate Partner Violence: Not on file    Family History:  Family History  Problem Relation Age of Onset   Diabetes Mother        steroid induced; hx. colitis   Anesthesia problems Mother        severe  headache lasting 2-3 days   Rheum arthritis Mother    Ulcerative colitis Mother    Fibromyalgia Mother    Henoch-Schonlein purpura Brother        in remission   Hypertension Maternal Grandmother    Hyperlipidemia Maternal Grandmother    Diabetes Maternal Grandmother    Kidney disease Maternal Grandfather    Hypertension Maternal Grandfather    Heart disease Maternal Grandfather    Hyperlipidemia Maternal Grandfather    Kidney disease Paternal Grandmother    Hypertension Paternal Grandmother    Diabetes Paternal Grandmother    Heart disease Paternal Grandmother    Hyperlipidemia Paternal Grandmother    Hypertension Paternal Grandfather    Hyperlipidemia Paternal Grandfather    Asthma Maternal Aunt  Seizures Maternal Uncle    Colon cancer Other        great aunt   Esophageal cancer Neg Hx    Esophageal varices Neg Hx    Pancreatic disease Neg Hx      Medications Lilliemae Fruge had no medications administered during this visit. Current Outpatient Medications  Medication Sig Dispense Refill   acetaminophen (TYLENOL) 160 MG/5ML elixir Take 960 mg by mouth every 4 (four) hours as needed for fever.     azelastine (ASTELIN) 0.1 % nasal spray Place 1 spray into both nostrils 2 (two) times daily. Use in each nostril as directed (Patient taking differently: Place 1 spray into both nostrils 2 (two) times daily as needed for rhinitis. Use in each nostril as directed) 30 mL 12   cetirizine (ZYRTEC) 10 MG tablet Take 10 mg by mouth daily as needed for allergies.     chlorhexidine (HIBICLENS) 4 % external liquid Apply topically daily as needed. 120 mL 0   cholecalciferol (VITAMIN D3) 25 MCG (1000 UNIT) tablet Take 1,000 Units by mouth daily.     Clindamycin-Benzoyl Per, Refr, gel Use as directed up to twice Levine day 45 g 3   Continuous Blood Gluc Receiver ([place]) [place] 1 each by Does not apply route as directed. Use with G7 sensor to check blood sugars 1 each 1   Continuous Blood  Gluc Sensor (DEXCOM G7 SENSOR) MISC Inject 1 Device into the skin as directed. Change sensor every 10 days. 3 each 5   dicyclomine (BENTYL) 20 MG tablet Take Levine half tablet 4 times daily before meals. 60 tablet 3   etonogestrel (NEXPLANON) 68 MG IMPL implant 1 each (68 mg total) by Subdermal route once. 1 each 0   fluticasone (FLONASE) 50 MCG/ACT nasal spray SHAKE LIQUID AND USE 2 SPRAYS IN EACH NOSTRIL DAILY 48 g 1   meloxicam (MOBIC) 15 MG tablet Take 15 mg by mouth daily.     Menaquinone-7 (VITAMIN K2) 100 MCG CAPS Take 100 mcg by mouth daily.     mesalamine ([place]AS[place]) 250 MG CR capsule Take 4 capsules (1,000 mg total) by mouth 3 (three) times daily. 360 capsule 5   Metamucil Fiber CHEW Chew 1 tablet by mouth daily.     Multiple Vitamins-Minerals (MULTIVITAMIN ADULT) CHEW Chew 1 tablet by mouth daily.     norethindrone (AYGESTIN) 5 MG tablet Take 1 tablet (5 mg total) by mouth 2 (two) times daily as needed (bleeding). For spotting take 1 tab daily. If flow take 2 tabs daily. If heavy bleeding take 3 tabs daily. When bleeding has stopped you can stop the medication. 180 tablet 3   olopatadine (PATANOL) 0.1 % ophthalmic solution Place 1 drop into both eyes 2 (two) times daily as needed for allergies.     spironolactone (ALDACTONE) 50 MG tablet Take 50 mg by mouth daily.     tirzepatide ([place]) 12.5 MG/0.5ML Pen Inject 12.5 mg into the skin once Levine week. 2 mL 2   Na Sulfate-K Sulfate-Mg Sulf 17.5-3.13-1.6 GM/177ML SOLN See admin instructions.     sulfamethoxazole-trimethoprim (BACTRIM) 200-40 MG/5ML suspension Take 20 mLs by mouth 2 (two) times daily. (Patient not taking: Reported on 10/07/2021)     Zonisamide 100 MG/5ML SUSP Take 14m every night 150 mL 11   No current facility-administered medications for this visit.    Allergies Patient has no known allergies.   Physical Exam:  BP 115/76   Pulse 98   Wt 212 lb (96.2 kg)  BMI 36.39 kg/m  Body mass index is 36.39 kg/m. General  appearance: Well nourished, well developed female in no acute distress.  Cardiovascular: normal s1 and s2.  No murmurs, rubs or gallops. Respiratory:  Clear to auscultation bilateral. Normal respiratory effort Abdomen: obese, soft, nttp Breasts: no lumps, bumps, nipple changes or abnormalities except healing lesions c/w hidranitis under the breasts and near the sternum. Healed ones and old scars seen in the axilla bilaterally Neuro/Psych:  Normal mood and affect.  Skin:  Warm and dry.  Lymphatic:  No inguinal lymphadenopathy.   Pelvic exam: is limited by body habitus EGBUS: within normal limits Vagina: unable to view completely b/c patient very intolerant of exam Cervix: not seen Bimanual: very limited by discomfort but cervix felt normal and no CMT. I was unable to get Levine spatula to do Levine blind pap due to patient discomfort.  Laboratory: none  Radiology: none  Assessment: pt stable  Plan:  1. Cervical cancer screening Unable to do pap smear. [name] to have Levine 9/25 upper endoscopy and the mom says it will be under General. I will let her PCP know that recommend doing exam under anesthesia and pap smear at that time. I'm actually on call at [facility] that week. Please call [phone] if patient is able to do her exam and pap then.   2. Nexplanon in place I told her that I haven't heard any AUB related to this class of medication, and it's Levine tough situation because she had no issues with the nexplanon prior to starting this medication.  They state they were told that it is not best for her to ever get pregnant.  I d/w her and mom re: Levine BTL as Levine non hormone contraception option, but that it would not affect her periods/improve them her and her mom.   I said that switching to any other method may or may not improve her s/s but I would recommend keeping the nexplanon in as her H/H have been stable over the past few years. She has difficulty taking pills but states she did okay trying the  aygestin Levine few months ago; another option could be provera 10 po bid x 3-5d starting from the 1st day of period.   3. Abnormal uterine bleeding (AUB)  4. Breast They state she's seen Levine dermatologist before and I recommend following up for the HS at her breast sites as well.   RTC PRN  Durene Romans MD Attending [facility] for [employer] [facility])

## 2021-10-07 NOTE — Patient Instructions (Addendum)
It was my pleasure to provide care to you today. Based on our discussion, I am providing you with my recommendations below:  RECOMMENDATION(S):   We discussed changing Lialda to something easier to swallow.   Do not cut, break, crush, or chew them. The contents of the Pentasa capsule may be sprinkled onto soft foods (eg, applesauce or yogurt) if you have trouble swallowing the capsule. The mixture must be swallowed right away without chewing.  Please stop in the lab for a stool test to monitor for inflammation. We will use this to monitor your response to therapy.  Avoid all non-steroidal anti-inflammatory medicines.   I'd like to see you back in the office in 4-6 weeks. However, please let me know if you need anything before then.  FOLLOW UP:  I would like for you to follow up with me in 4-6 weeks. Please call the office at [phone] to schedule your appointment.  BMI:  If you are age 21 or older, your body mass index should be between 23-30. Your Body mass index is 36.45 kg/m. If this is out of the aforementioned range listed, please consider follow up with your Primary Care Provider.  If you are age 27 or younger, your body mass index should be between 19-25. Your Body mass index is 36.45 kg/m. If this is out of the aformentioned range listed, please consider follow up with your Primary Care Provider.   MY CHART:  The [facility] providers would like to encourage you to use [facility] to communicate with providers for non-urgent requests or questions.  Due to long hold times on the telephone, sending your provider a message by [facility] may be a faster and more efficient way to get a response.  Please allow 48 business hours for a response.  Please remember that this is for non-urgent requests.   Thank you for trusting me with your gastrointestinal care!    Thornton Park, MD, MPH

## 2021-10-07 NOTE — Progress Notes (Signed)
Patient informed me that she has "sores" on breasts. She has experienced this before, however, she states this is the worst flair up, which started in July

## 2021-10-07 NOTE — Progress Notes (Signed)
Referring Provider: [name], * Primary Care Physician:  [name], MD  Chief Comp:  Diarrhea, joint pain, Mom with UC   IMPRESSION:  Cecal patch of chronic colitis with biopsies of TI and colon    - CRP and fecal calprotectin elevated    - CTE negative Microcytic anemia Thrombocytosis  PLAN: - Switch Lialda 4.8g daily to Pentasa - Follow fecal calprotectin, CRP - Avoid NSAIDs - Dicyclomine 10 mg QID taken prior to meals PRN - Low threshold to consider capsule endoscopy - Office follow-up in 4-6 weeks, earlier if needed   HPI: Deanna Levine is a 21 y.o. female initially referred by Dr. Sharlet Salina for evaluation of diarrhea and abdominal cramping. She has concurrent joint pain and her Mom is followed by Dr. Fuller Plan for ulcerative colitis. [name] seen today in the company of her father, who contributes to the history. 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   Initially seen 05/20/21 for a one month history of change in bowel habits. Having several BM daily - up to 7 if not more - described as "drippy stools" with associated lower abdominal cramping. No blood or mucous in the stool. Drinks zero-sugar gatorade and diet V8. Eating bananas may have provided relief.   TSH normal 1/23 CRP 20.8 2021 Hemoglobin 11.2, MCV 67.8, RDW 17.7, platelets 409  Colonoscopy 07/16/21: - Erythematous mucosa in the cecum. This is of unclear clinical significance. Biopsied. - The entire examined colon is normal. Biopsied. - The examined portion of the ileum was normal. Biopsied. - The examination was otherwise normal on direct and retroflexion views.  Pathology results:  - Normal terminal ileum biopsies - Severely active chronic nonspecific colitis with ulceration without granulomas in the ascending colon - Other colon biopsies were normal  Fecal calprotectin 137 CTE  negative except for diverticulosis  Started on Lialda. However, she has not been taking it. [name] unable to swallow the mesalamine and did not actually start the medicine.  Having 3 bowel movements daily and associated abdominal pain. No new complaints or concerns.      Past Medical History:  Diagnosis Date   Absence seizure disorder ([place])    Allergy    Colitis 92/42/6834   Complication of anesthesia    slow to wake up from anesthesia, occ HA with anesthesia    Diabetes mellitus without complication (HCC)    type 2   Difficulty swallowing pills    Dyspraxia    Eczema    Global developmental delay    Obesity (BMI 30-39.9)    PONV (postoperative nausea and vomiting)    Precocious female puberty    Seizures ([place])    last seizure 2012 - no current med   Sleep apnea    White-Sutton syndrome     Past Surgical History:  Procedure Laterality Date   ADENOIDECTOMY     BIOPSY  07/16/2021   Procedure: BIOPSY;  Surgeon: Thornton Park, MD;  Location: [place];  Service: Gastroenterology;;   bone removal Left 01/16/2021   foot   COLONOSCOPY WITH PROPOFOL N/A 07/16/2021   Procedure: COLONOSCOPY WITH PROPOFOL;  Surgeon: Thornton Park, MD;  Location: [place];  Service: Gastroenterology;  Laterality: N/A;   GASTROCNEMIUS RECESSION Bilateral 04/25/2020   Procedure: BILATERAL GASTROCNEMIUS RECESSION;  Surgeon: Newt Minion, MD;  Location: [place];  Service: Orthopedics;  Laterality: Bilateral;   SUPPRELIN IMPLANT  05/06/2011   Procedure:  SUPPRELIN IMPLANT;  Surgeon: Jerilynn Mages. Gerald Stabs, MD;  Location: [place];  Service: Pediatrics;  Laterality: Right;   SUPPRELIN IMPLANT Right 06/14/2013   Procedure: REMOVAL OF SUPPRELIN IMPLANT FROM RIGHT UPPER ARM;  Surgeon: Jerilynn Mages. Gerald Stabs, MD;  Location: [place];  Service: Pediatrics;  Laterality: Right;   TONSILLECTOMY     TONSILLECTOMY AND ADENOIDECTOMY     TYMPANOSTOMY TUBE PLACEMENT     x 2       Current Outpatient Medications  Medication Sig Dispense Refill   acetaminophen (TYLENOL) 160 MG/5ML elixir Take 960 mg by mouth every 4 (four) hours as needed for fever.     azelastine (ASTELIN) 0.1 % nasal spray Place 1 spray into both nostrils 2 (two) times daily. Use in each nostril as directed (Patient taking differently: Place 1 spray into both nostrils 2 (two) times daily as needed for rhinitis. Use in each nostril as directed) 30 mL 12   cetirizine (ZYRTEC) 10 MG tablet Take 10 mg by mouth daily as needed for allergies.     chlorhexidine (HIBICLENS) 4 % external liquid Apply topically daily as needed. 120 mL 0   cholecalciferol (VITAMIN D3) 25 MCG (1000 UNIT) tablet Take 1,000 Units by mouth daily.     Clindamycin-Benzoyl Per, Refr, gel Use as directed up to twice a day 45 g 3   Continuous Blood Gluc Receiver ([place]) Arthur 1 each by Does not apply route as directed. Use with G7 sensor to check blood sugars 1 each 1   Continuous Blood Gluc Sensor (DEXCOM G7 SENSOR) MISC Inject 1 Device into the skin as directed. Change sensor every 10 days. 3 each 5   dicyclomine (BENTYL) 20 MG tablet Take a half tablet 4 times daily before meals. 60 tablet 3   etonogestrel (NEXPLANON) 68 MG IMPL implant 1 each (68 mg total) by Subdermal route once. 1 each 0   fluticasone (FLONASE) 50 MCG/ACT nasal spray SHAKE LIQUID AND USE 2 SPRAYS IN EACH NOSTRIL DAILY 48 g 1   meloxicam (MOBIC) 15 MG tablet Take 15 mg by mouth daily.     Menaquinone-7 (VITAMIN K2) 100 MCG CAPS Take 100 mcg by mouth daily.     mesalamine (PENTASA) 250 MG CR capsule Take 4 capsules (1,000 mg total) by mouth 3 (three) times daily. 360 capsule 5   Metamucil Fiber CHEW Chew 1 tablet by mouth daily.     Multiple Vitamins-Minerals (MULTIVITAMIN ADULT) CHEW Chew 1 tablet by mouth daily.     Na Sulfate-K Sulfate-Mg Sulf 17.5-3.13-1.6 GM/177ML SOLN See admin instructions.     norethindrone (AYGESTIN) 5 MG tablet Take 1 tablet  (5 mg total) by mouth 2 (two) times daily as needed (bleeding). For spotting take 1 tab daily. If flow take 2 tabs daily. If heavy bleeding take 3 tabs daily. When bleeding has stopped you can stop the medication. 180 tablet 3   olopatadine (PATANOL) 0.1 % ophthalmic solution Place 1 drop into both eyes 2 (two) times daily as needed for allergies.     spironolactone (ALDACTONE) 50 MG tablet Take 50 mg by mouth daily.     sulfamethoxazole-trimethoprim (BACTRIM) 200-40 MG/5ML suspension Take 20 mLs by mouth 2 (two) times daily.     tirzepatide (MOUNJARO) 12.5 MG/0.5ML Pen Inject 12.5 mg into the skin once a week. 2 mL 2   Zonisamide 100 MG/5ML SUSP Take 55m every night 150 mL 11   No current facility-administered medications for this visit.  Allergies as of 10/07/2021   (No Known Allergies)    Family History  Problem Relation Age of Onset   Diabetes Mother        steroid induced; hx. colitis   Anesthesia problems Mother        severe headache lasting 2-3 days   Rheum arthritis Mother    Ulcerative colitis Mother    Fibromyalgia Mother    Henoch-Schonlein purpura Brother        in remission   Hypertension Maternal Grandmother    Hyperlipidemia Maternal Grandmother    Diabetes Maternal Grandmother    Kidney disease Maternal Grandfather    Hypertension Maternal Grandfather    Heart disease Maternal Grandfather    Hyperlipidemia Maternal Grandfather    Kidney disease Paternal Grandmother    Hypertension Paternal Grandmother    Diabetes Paternal Grandmother    Heart disease Paternal Grandmother    Hyperlipidemia Paternal Grandmother    Hypertension Paternal Grandfather    Hyperlipidemia Paternal Grandfather    Asthma Maternal Aunt    Seizures Maternal Uncle    Colon cancer Other        great aunt   Esophageal cancer Neg Hx    Esophageal varices Neg Hx    Pancreatic disease Neg Hx      Physical Exam: General:   Alert,  well-nourished, pleasant and cooperative in NAD Head:   Normocephalic and atraumatic. Eyes:  Sclera clear, no icterus.   Conjunctiva pink. Abdomen:  Soft, nontender, nondistended, normal bowel sounds, no rebound or guarding. No hepatosplenomegaly.   Neurologic:  Alert and  oriented x4;  grossly nonfocal Skin:  Intact without significant lesions or rashes. Psych:  Alert and cooperative. Normal mood and affect.     Jaymien Landin L. Tarri Glenn, MD, MPH 10/07/2021, 11:38 AM

## 2021-10-09 LAB — CALPROTECTIN, FECAL: Calprotectin, Fecal: 366 ug/g — ABNORMAL HIGH (ref 0–120)

## 2021-10-12 ENCOUNTER — Encounter: Payer: Self-pay | Admitting: Internal Medicine

## 2021-10-12 ENCOUNTER — Telehealth: Payer: Self-pay | Admitting: Internal Medicine

## 2021-10-12 NOTE — Telephone Encounter (Signed)
Made in error

## 2021-10-13 ENCOUNTER — Ambulatory Visit (INDEPENDENT_AMBULATORY_CARE_PROVIDER_SITE_OTHER): Payer: 59 | Admitting: Physical Medicine and Rehabilitation

## 2021-10-13 DIAGNOSIS — R202 Paresthesia of skin: Secondary | ICD-10-CM

## 2021-10-13 NOTE — Telephone Encounter (Signed)
Called and spoke to [place] at [facility] and was advised they need the face to face notes. Fax to [place] at [phone]. She states she will call back with more info if pt needs a new sleep study. Sleep study has been faxed.

## 2021-10-13 NOTE — Progress Notes (Unsigned)
   Deanna Levine - 21 y.o. female MRN [id number]  Date of birth: 2000/04/08  Office Visit Note: Visit Date: 10/13/2021 PCP: [name], MD Referred by: Sherilyn Cooter, MD  Subjective: Chief Complaint  Patient presents with   Left Hand - Pain, Numbness    Numbness and tingling in fingers    Right Hand - Pain, Numbness   HPI:  Deanna Levine is a 21 y.o. female who comes in today at the request of Dr. Sherilyn Cooter for electrodiagnostic study of the Bilateral upper extremities.  Patient is Right hand dominant.    ROS Otherwise per HPI.  Assessment & Plan: Visit Diagnoses:    ICD-10-CM   1. Paresthesia of skin  R20.2 NCV with EMG (electromyography)      Plan: No additional findings.   Meds & Orders: No orders of the defined types were placed in this encounter.   Orders Placed This Encounter  Procedures   NCV with EMG (electromyography)    Follow-up: No follow-ups on file.   Procedures: No procedures performed      Clinical History: No specialty comments available.     Objective:  VS:  HT:    WT:   BMI:     BP:   HR: bpm  TEMP: ( )  RESP:  Physical Exam   Imaging: No results found.

## 2021-10-13 NOTE — Progress Notes (Unsigned)
Numbness and tingling in both hands / fingers. Doing OT. It helps.

## 2021-10-14 NOTE — Procedures (Signed)
EMG & NCV Findings: Evaluation of the left median (across palm) sensory nerve showed prolonged distal peak latency (Palm, 2.8 ms).  The left ulnar sensory nerve showed reduced amplitude (11.2 V).  All remaining nerves (as indicated in the following tables) were within normal limits.  Left vs. Right side comparison data for the ulnar sensory nerve indicates abnormal L-R amplitude difference (78.4 %).  All remaining left vs. right side differences were within normal limits.    All examined muscles (as indicated in the following table) showed no evidence of electrical instability.    Impression: Essentially NORMAL electrodiagnostic study of both upper limbs.  There is no significant electrodiagnostic evidence of nerve entrapment, brachial plexopathy or cervical radiculopathy.    As you know, purely sensory or demyelinating radiculopathies and chemical radiculitis may not be detected with this particular electrodiagnostic study.  **As you know, this particular electrodiagnostic study cannot rule out chemical radiculitis or sensory only radiculopathy.  Recommendations: 1.  Follow-up with referring physician. 2.  Continue current management of symptoms.  ___________________________ Laurence Spates FAAPMR Board Certified, American Board of Physical Medicine and Rehabilitation    Nerve Conduction Studies Anti Sensory Summary Table   Stim Site NR Peak (ms) Norm Peak (ms) P-T Amp (V) Norm P-T Amp Site1 Site2 Delta-P (ms) Dist (cm) Vel (m/s) Norm Vel (m/s)  Left Median Acr Palm Anti Sensory (2nd Digit)  31.7C  Wrist    3.0 <3.6 38.5 >10 Wrist Palm 0.2 0.0    Palm    *2.8 <2.0 2.7         Right Median Acr Palm Anti Sensory (2nd Digit)  30.1C  Wrist    2.9 <3.6 46.7 >10 Wrist Palm 1.3 0.0    Palm    1.6 <2.0 30.6         Left Radial Anti Sensory (Base 1st Digit)  31.5C  Wrist    1.9 <3.1 67.7  Wrist Base 1st Digit 1.9 0.0    Right Radial Anti Sensory (Base 1st Digit)  30.4C  Wrist    1.8 <3.1  29.7  Wrist Base 1st Digit 1.8 0.0    Left Ulnar Anti Sensory (5th Digit)  31.8C  Wrist    3.1 <3.7 *11.2 >15.0 Wrist 5th Digit 3.1 14.0 45 >38  Right Ulnar Anti Sensory (5th Digit)  30.4C  Wrist    2.8 <3.7 51.8 >15.0 Wrist 5th Digit 2.8 14.0 50 >38   Motor Summary Table   Stim Site NR Onset (ms) Norm Onset (ms) O-P Amp (mV) Norm O-P Amp Site1 Site2 Delta-0 (ms) Dist (cm) Vel (m/s) Norm Vel (m/s)  Left Median Motor (Abd Poll Brev)  31.5C  Wrist    2.8 <4.2 9.5 >5 Elbow Wrist 3.8 21.0 55 >50  Elbow    6.6  4.6         Right Median Motor (Abd Poll Brev)  30.5C  Wrist    2.9 <4.2 10.8 >5 Elbow Wrist 3.6 19.0 53 >50  Elbow    6.5  2.9         Left Ulnar Motor (Abd Dig Min)  31.5C  Wrist    2.7 <4.2 4.7 >3 B Elbow Wrist 3.1 18.5 60 >53  B Elbow    5.8  11.3  A Elbow B Elbow 1.2 10.0 83 >53  A Elbow    7.0  11.2         Right Ulnar Motor (Abd Dig Min)  30.7C  Wrist    2.9 <4.2  5.9 >3 B Elbow Wrist 3.1 19.0 61 >53  B Elbow    6.0  10.2  A Elbow B Elbow 1.0 10.0 100 >53  A Elbow    7.0  10.4          EMG   Side Muscle Nerve Root Ins Act Fibs Psw Amp Dur Poly Recrt Int Fraser Din Comment  Left Abd Poll Brev Median C8-T1 Nml Nml Nml Nml Nml 0 Nml Nml   Left 1stDorInt Ulnar C8-T1 Nml Nml Nml Nml Nml 0 Nml Nml   Left PronatorTeres Median C6-7 Nml Nml Nml Nml Nml 0 Nml Nml   Left Biceps Musculocut C5-6 Nml Nml Nml Nml Nml 0 Nml Nml   Left Deltoid Axillary C5-6 Nml Nml Nml Nml Nml 0 Nml Nml     Nerve Conduction Studies Anti Sensory Left/Right Comparison   Stim Site L Lat (ms) R Lat (ms) L-R Lat (ms) L Amp (V) R Amp (V) L-R Amp (%) Site1 Site2 L Vel (m/s) R Vel (m/s) L-R Vel (m/s)  Median Acr Palm Anti Sensory (2nd Digit)  31.7C  Wrist 3.0 2.9 0.1 38.5 46.7 17.6 Wrist Palm     Palm *2.8 1.6 1.2 2.7 30.6 91.2       Radial Anti Sensory (Base 1st Digit)  31.5C  Wrist 1.9 1.8 0.1 67.7 29.7 56.1 Wrist Base 1st Digit     Ulnar Anti Sensory (5th Digit)  31.8C  Wrist 3.1 2.8 0.3 *11.2 51.8  *78.4 Wrist 5th Digit 45 50 5   Motor Left/Right Comparison   Stim Site L Lat (ms) R Lat (ms) L-R Lat (ms) L Amp (mV) R Amp (mV) L-R Amp (%) Site1 Site2 L Vel (m/s) R Vel (m/s) L-R Vel (m/s)  Median Motor (Abd Poll Brev)  31.5C  Wrist 2.8 2.9 0.1 9.5 10.8 12.0 Elbow Wrist 55 53 2  Elbow 6.6 6.5 0.1 4.6 2.9 37.0       Ulnar Motor (Abd Dig Min)  31.5C  Wrist 2.7 2.9 0.2 4.7 5.9 20.3 B Elbow Wrist 60 61 1  B Elbow 5.8 6.0 0.2 11.3 10.2 9.7 A Elbow B Elbow 83 100 17  A Elbow 7.0 7.0 0.0 11.2 10.4 7.1          Waveforms:

## 2021-10-15 ENCOUNTER — Ambulatory Visit: Payer: 59 | Attending: Internal Medicine | Admitting: Audiologist

## 2021-10-15 DIAGNOSIS — Q8789 Other specified congenital malformation syndromes, not elsewhere classified: Secondary | ICD-10-CM | POA: Diagnosis present

## 2021-10-15 DIAGNOSIS — H9193 Unspecified hearing loss, bilateral: Secondary | ICD-10-CM | POA: Insufficient documentation

## 2021-10-15 NOTE — Procedures (Signed)
  Outpatient Audiology and [place], [place]  [phone]  AUDIOLOGICAL  EVALUATION  NAME: Deanna Levine     DOB:   19-Mar-2000      MRN: [id number]                                                                                     DATE: 10/15/2021     REFERENT: [name], MD STATUS: Outpatient DIAGNOSIS: White-Sutton Syndrome     History: Anikah was seen for an audiological evaluation.  Rosangelica is receiving a hearing evaluation due to concerns for possible hearing loss due to her White-Sutton Syndrome. Mai says the testing was recommended by her genetic doctor. Tyesha denies difficulty hearing, but says she does make a lot of earwax. No pain or pressure reported in either ear. Tinnitus denied for both ears. Mariana has no concerns for her hearing. Parents not present for history.  Medical history positive for White Sutton syndrome and type II diabetes which are risk factors for hearing loss. No other relevant case history reported.   Evaluation:  Otoscopy showed a clear view of the tympanic membranes, bilaterally Tympanometry results were consistent with normal middle ear pressure, bilaterally   Audiometric testing was completed using conventional audiometry with supraural transducer. Speech Recognition Thresholds were  10dB in the right ear and 10dB in the left ear. Word Recognition was performed 40dB SL, scored 100% in the right ear and 100% in the left ear. Pure tone thresholds show normal hearing loss in each ear. DPOAEs present in each ear 1.5-7kHz, absent 8-12kHz bilaterally    Results:  The test results were reviewed with [name]. She has normal hearing in each ear. Recommend follow up if she every perceives a change in hearing.    Recommendations: 1.   No further audiologic testing is needed unless future hearing concerns arise.     23 minutes spent testing and counseling on results.   Alfonse Alpers   Audiologist, Au.D., CCC-A 10/15/2021  11:21 AM  Cc: [name], MD

## 2021-10-16 ENCOUNTER — Encounter: Payer: Self-pay | Admitting: Occupational Therapy

## 2021-10-16 ENCOUNTER — Ambulatory Visit: Payer: 59 | Admitting: Orthopedic Surgery

## 2021-10-16 ENCOUNTER — Telehealth: Payer: Self-pay | Admitting: Podiatry

## 2021-10-16 NOTE — Telephone Encounter (Signed)
LVM for Stephanie at [facility] to call back in regards to casting that were sent off last month. When call is returned please secure chat and teams me for direct response as this call back is urgent.

## 2021-10-19 ENCOUNTER — Other Ambulatory Visit (HOSPITAL_BASED_OUTPATIENT_CLINIC_OR_DEPARTMENT_OTHER): Payer: Self-pay

## 2021-10-20 ENCOUNTER — Telehealth (HOSPITAL_COMMUNITY): Payer: Self-pay | Admitting: Pharmacy Technician

## 2021-10-20 NOTE — Telephone Encounter (Signed)
Patient Advocate Encounter   Received notification that prior authorization for Zonisade 100MG/5ML suspension is required.   PA submitted on 10/20/2021 Key [id number] Status is pending       Lyndel Safe, [place] Patient Advocate Specialist [place] Patient Advocate Team Direct Number: [phone]  Fax: [phone]

## 2021-10-20 NOTE — Telephone Encounter (Signed)
A Prior Authorization Approval already on File.  No Prior Authorization needed.  Patient Advocate Encounter   Prior Authorization for  Zonisade 100MG/5ML suspension   has been approved.     PA# [id number] Effective dates: 08/18/2021 through 08/21/2022           Lyndel Safe, [place] Patient Advocate Specialist [place] Patient Advocate Team Direct Number: [phone]  Fax: [phone]

## 2021-10-21 ENCOUNTER — Other Ambulatory Visit (HOSPITAL_COMMUNITY): Payer: Self-pay

## 2021-10-21 ENCOUNTER — Other Ambulatory Visit (HOSPITAL_BASED_OUTPATIENT_CLINIC_OR_DEPARTMENT_OTHER): Payer: Self-pay

## 2021-10-22 ENCOUNTER — Ambulatory Visit (HOSPITAL_COMMUNITY): Payer: 59 | Attending: Cardiology

## 2021-10-22 ENCOUNTER — Ambulatory Visit (INDEPENDENT_AMBULATORY_CARE_PROVIDER_SITE_OTHER): Payer: 59 | Admitting: Orthopedic Surgery

## 2021-10-22 ENCOUNTER — Encounter: Payer: Self-pay | Admitting: Orthopedic Surgery

## 2021-10-22 DIAGNOSIS — Q8789 Other specified congenital malformation syndromes, not elsewhere classified: Secondary | ICD-10-CM | POA: Diagnosis present

## 2021-10-22 DIAGNOSIS — R200 Anesthesia of skin: Secondary | ICD-10-CM

## 2021-10-22 LAB — ECHOCARDIOGRAM COMPLETE
Area-P 1/2: 4.85 cm2
S' Lateral: 2.7 cm

## 2021-10-22 NOTE — Progress Notes (Signed)
Office Visit Note   Patient: Deanna Levine           Date of Birth: [birth year]           MRN: [id number] Visit Date: 10/22/2021              Requested by: [name], MD [address] [place],  [place] [address] PCP: [name], MD   Assessment & Plan: Visit Diagnoses:  1. Bilateral hand numbness     Plan: Patient presents today to review her recent EMG/NCS which was unremakable.  Patient continues to complain of intermittent numbness in the whole hand.  Her mom also notes that she will occasionally describe numbness in her feet.  Discussed that I do not think her numbness is related to carpal tunnel syndrome given the normal electrodiagnostic studies and involvement of her feet.  She does have a history of DM w/ questionable control in the past.  Her A1c has been as high as 11-12 so there may be some component of neuropathy.  She notes that her symptoms were greatly improved by therapy and she would like to try this again.  Another referral will be made to the [facility] OT department.  I can see her back again as needed.   Follow-Up Instructions: No follow-ups on file.   Orders:  No orders of the defined types were placed in this encounter.  No orders of the defined types were placed in this encounter.     Procedures: No procedures performed   Clinical Data: No additional findings.   Subjective: Chief Complaint  Patient presents with   Right Hand - Numbness, Follow-up   Left Hand - Numbness, Follow-up    This is a 21 yo F who presents for follow up bilateral hand and wrist pain w/ numbness.  Mom notes taht patient will complain of numbness in her hands intermittently with some days/weeks with no symptoms and some days in which symptoms are quite bothersome.  Patient describes numbness in all of her fingers and entire hand. She recently underwent EMG/NCS which was normal.  Mom notes taht patient will also complain of intermittent numbness in her  feet.  She was previously treated by OT at [facility] and notes that her symptoms were much better when she was going to therapy.  She would like to continue therapy.      Review of Systems   Objective: Vital Signs: There were no vitals taken for this visit.  Physical Exam  Right Hand Exam   Other  Erythema: absent Sensation: normal Pulse: present  Comments:  Questionable Tinel, Phalen, Durkan signs today. 5/5 thenar motor strength.    Left Hand Exam   Other  Erythema: absent Sensation: normal Pulse: present  Comments:  Questionable Tinel, Phalen, Durkan signs today. 5/5 thenar motor strength.       Specialty Comments:  No specialty comments available.  Imaging: No results found.   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   Other adverse food reactions, not elsewhere classified, subsequent encounter 08/20/2020   Other allergic rhinitis 08/20/2020   Allergic conjunctivitis of both eyes 08/20/2020   Encounter  for general adult medical examination with abnormal findings 07/03/2020   Leg length discrepancy 06/05/2020   Hidradenitis suppurativa 06/05/2020   Intellectual disability 06/05/2020   Achilles tendon contracture, bilateral    Elevated sedimentation rate 02/07/2020   Pain in right ankle and joints of right foot 02/07/2020   Anemia 12/29/2018   Type 2 diabetes mellitus ([place]) 08/15/2017   Elevated triglycerides with high cholesterol 12/09/2016   Partial epilepsy with impairment of consciousness ([place]) 08/16/2012   Mild intellectual disability 08/16/2012   Laxity of ligament 08/16/2012   Delayed  milestones 08/16/2012   Obesity 06/07/2011   Dyspepsia 02/11/2011   Acanthosis nigricans 02/11/2011   Global developmental delay    Petit mal without grand mal seizures ([place])    Dyspraxia    Past Medical History:  Diagnosis Date   Absence seizure disorder ([place])    Allergy    Colitis 56/86/1683   Complication of anesthesia    slow to wake up from anesthesia, occ HA with anesthesia    Diabetes mellitus without complication ([place])    type 2   Difficulty swallowing pills    Dyspraxia    Eczema    Global developmental delay    Obesity (BMI 30-39.9)    PONV (postoperative nausea and vomiting)    Precocious female puberty    Seizures ([place])    last seizure 2012 - no current med   Sleep apnea    White-Sutton syndrome     Family History  Problem Relation Age of Onset   Diabetes Mother        steroid induced; hx. colitis   Anesthesia problems Mother        severe headache lasting 2-3 days   Rheum arthritis Mother    Ulcerative colitis Mother    Fibromyalgia Mother    Henoch-Schonlein purpura Brother        in remission   Hypertension Maternal Grandmother    Hyperlipidemia Maternal Grandmother    Diabetes Maternal Grandmother    Kidney disease Maternal Grandfather    Hypertension Maternal Grandfather    Heart disease Maternal Grandfather    Hyperlipidemia Maternal Grandfather    Kidney disease Paternal Grandmother    Hypertension Paternal Grandmother    Diabetes Paternal Grandmother    Heart disease Paternal Grandmother    Hyperlipidemia Paternal Grandmother    Hypertension Paternal Grandfather    Hyperlipidemia Paternal Grandfather    Asthma Maternal Aunt    Seizures Maternal Uncle    Colon cancer Other        great aunt   Esophageal cancer Neg Hx    Esophageal varices Neg Hx    Pancreatic disease Neg Hx     Past Surgical History:  Procedure Laterality Date   ADENOIDECTOMY     BIOPSY  07/16/2021   Procedure: BIOPSY;  Surgeon: Thornton Park, MD;  Location: [place];  Service: Gastroenterology;;   bone removal Left 01/16/2021   foot   COLONOSCOPY WITH PROPOFOL N/A 07/16/2021   Procedure: COLONOSCOPY WITH PROPOFOL;  Surgeon: Thornton Park, MD;  Location: [place];  Service: Gastroenterology;  Laterality: N/A;   GASTROCNEMIUS RECESSION Bilateral 04/25/2020   Procedure: BILATERAL GASTROCNEMIUS RECESSION;  Surgeon: Newt Minion, MD;  Location: [place];  Service: Orthopedics;  Laterality: Bilateral;   SUPPRELIN IMPLANT  05/06/2011   Procedure: SUPPRELIN IMPLANT;  Surgeon: Jerilynn Mages. Gerald Stabs, MD;  Location: [place];  Service: Pediatrics;  Laterality: Right;   SUPPRELIN IMPLANT Right 06/14/2013   Procedure: REMOVAL OF  SUPPRELIN IMPLANT FROM RIGHT UPPER ARM;  Surgeon: M. Gerald Stabs, MD;  Location: [place];  Service: Pediatrics;  Laterality: Right;   TONSILLECTOMY     TONSILLECTOMY AND ADENOIDECTOMY     TYMPANOSTOMY TUBE PLACEMENT     x 2   Social History   Occupational History   Occupation: [employer]  Tobacco Use   Smoking status: Never    Passive exposure: Never   Smokeless tobacco: Never  Vaping Use   Vaping Use: Never used  Substance and Sexual Activity   Alcohol use: No    Alcohol/week: 0.0 standard drinks of alcohol   Drug use: No   Sexual activity: Never    Birth control/protection: None

## 2021-10-26 ENCOUNTER — Encounter (INDEPENDENT_AMBULATORY_CARE_PROVIDER_SITE_OTHER): Payer: Self-pay | Admitting: Pharmacist

## 2021-10-26 DIAGNOSIS — E11649 Type 2 diabetes mellitus with hypoglycemia without coma: Secondary | ICD-10-CM

## 2021-10-26 MED ORDER — MOUNJARO 12.5 MG/0.5ML ~~LOC~~ SOAJ: 12.5000 mg | SUBCUTANEOUS | 2 refills | Status: DC

## 2021-10-28 ENCOUNTER — Other Ambulatory Visit: Payer: Self-pay | Admitting: Obstetrics and Gynecology

## 2021-10-28 ENCOUNTER — Encounter: Payer: Self-pay | Admitting: Internal Medicine

## 2021-10-28 ENCOUNTER — Encounter: Payer: 59 | Admitting: Family Medicine

## 2021-10-29 ENCOUNTER — Telehealth (INDEPENDENT_AMBULATORY_CARE_PROVIDER_SITE_OTHER): Payer: Self-pay

## 2021-10-29 MED ORDER — MOUNJARO 12.5 MG/0.5ML ~~LOC~~ SOAJ: SUBCUTANEOUS | 1 refills | Status: DC

## 2021-10-29 NOTE — Telephone Encounter (Signed)
Received fax stating that Mounjaro 12.5MG/0.5ML pen-injectors had previously been approved from 04/29/2021 thru 04/30/2022.

## 2021-10-29 NOTE — Addendum Note (Signed)
Addended by: Mike Gip A on: 10/29/2021 09:45 AM   Modules accepted: Orders

## 2021-10-29 NOTE — Telephone Encounter (Signed)
Received fax to initiate PA for Mounjaro 12.5MG/0.5ML pen-injectors. Initiated in covermymeds:

## 2021-11-06 ENCOUNTER — Other Ambulatory Visit (HOSPITAL_COMMUNITY): Payer: Self-pay

## 2021-11-06 NOTE — Progress Notes (Signed)
Sent message, via epic in basket, requesting orders in epic from surgeon.  

## 2021-11-09 ENCOUNTER — Other Ambulatory Visit: Payer: Self-pay | Admitting: Obstetrics and Gynecology

## 2021-11-11 ENCOUNTER — Ambulatory Visit (INDEPENDENT_AMBULATORY_CARE_PROVIDER_SITE_OTHER): Payer: 59 | Admitting: Internal Medicine

## 2021-11-11 ENCOUNTER — Encounter: Payer: Self-pay | Admitting: Internal Medicine

## 2021-11-11 VITALS — BP 112/68 | HR 88 | Ht 64.5 in | Wt 207.0 lb

## 2021-11-11 DIAGNOSIS — E11628 Type 2 diabetes mellitus with other skin complications: Secondary | ICD-10-CM | POA: Diagnosis not present

## 2021-11-11 DIAGNOSIS — Z23 Encounter for immunization: Secondary | ICD-10-CM

## 2021-11-11 DIAGNOSIS — R620 Delayed milestone in childhood: Secondary | ICD-10-CM

## 2021-11-11 DIAGNOSIS — Z0001 Encounter for general adult medical examination with abnormal findings: Secondary | ICD-10-CM | POA: Diagnosis not present

## 2021-11-11 MED ORDER — AMOXICILLIN 250 MG/5ML PO SUSR: 500.0000 mg | Freq: Two times a day (BID) | ORAL | 0 refills | Status: AC

## 2021-11-11 MED ORDER — ONDANSETRON 4 MG PO TBDP: 4.0000 mg | ORAL_TABLET | Freq: Three times a day (TID) | ORAL | 3 refills | Status: DC | PRN

## 2021-11-11 NOTE — Progress Notes (Signed)
   Subjective:   Patient ID: Deanna Levine, female    DOB: October 09, 2000, 21 y.o.   MRN: [id number]  HPI The patient is here for physical.  PMH, [facility], social history reviewed and updated  Review of Systems  Constitutional: Negative.   HENT: Negative.    Eyes: Negative.   Respiratory:  Negative for cough, chest tightness and shortness of breath.   Cardiovascular:  Negative for chest pain, palpitations and leg swelling.  Gastrointestinal:  Positive for diarrhea. Negative for abdominal distention, abdominal pain, constipation, nausea and vomiting.  Musculoskeletal:  Positive for arthralgias, gait problem and myalgias.  Skin:  Positive for wound.  Psychiatric/Behavioral:  Positive for decreased concentration and dysphoric mood.     Objective:  Physical Exam Constitutional:      Appearance: [name] well-developed.  HENT:     Head: Normocephalic and atraumatic.  Cardiovascular:     Rate and Rhythm: Normal rate and regular rhythm.  Pulmonary:     Effort: Pulmonary effort is normal. No respiratory distress.     Breath sounds: Normal breath sounds. No wheezing or rales.  Abdominal:     General: Bowel sounds are normal. There is no distension.     Palpations: Abdomen is soft.     Tenderness: There is no abdominal tenderness. There is no rebound.  Musculoskeletal:        General: Tenderness present.     Cervical back: Normal range of motion.  Skin:    General: Skin is warm and dry.  Neurological:     Mental Status: [name] alert and oriented to person, place, and time. Mental status is at baseline.     Coordination: Coordination normal.     Vitals:   11/11/21 0832  BP: 112/68  Pulse: 88  SpO2: 98%  Weight: 207 lb (93.9 kg)  Height: 5' 4.5" (1.638 m)    Assessment & Plan:  Flu shot given at visit

## 2021-11-11 NOTE — Patient Instructions (Signed)
We have sent in amoxicillin to take 10 mL twice a day for 5 days.  We have sent in the zofran dissolvable to use if needed for nausea with refills.

## 2021-11-11 NOTE — Patient Instructions (Signed)
DUE TO COVID-19 ONLY TWO VISITORS  (aged 21 and older)  ARE ALLOWED TO COME WITH YOU AND STAY IN THE WAITING ROOM ONLY DURING PRE OP AND PROCEDURE.   **NO VISITORS ARE ALLOWED IN THE SHORT STAY AREA OR RECOVERY ROOM!!**  IF YOU WILL BE ADMITTED INTO THE HOSPITAL YOU ARE ALLOWED ONLY FOUR SUPPORT PEOPLE DURING VISITATION HOURS ONLY (7 AM -8PM)   The support person(s) must pass our screening, gel in and out, and wear a mask at all times, including in the patient's room. Patients must also wear a mask when staff or their support person are in the room. Visitors GUEST BADGE MUST BE WORN VISIBLY  One adult visitor may remain with you overnight and MUST be in the room by 8 P.M.     Your procedure is scheduled on: 11/23/21   Report to [facility] Main Entrance    Report to admitting at 8:00 AM   Call this number if you have problems the morning of surgery [phone]   Do not eat food or drink:After Midnight.               If you have questions, please contact your surgeon's office.   FOLLOW BOWEL PREP AND ANY ADDITIONAL PRE OP INSTRUCTIONS YOU RECEIVED FROM YOUR SURGEON'S OFFICE!!!     Oral Hygiene is also important to reduce your risk of infection.                                    Remember - BRUSH YOUR TEETH THE MORNING OF SURGERY WITH YOUR REGULAR TOOTHPASTE   Do NOT smoke after Midnight   Take these medicines the morning of surgery with A SIP OF WATER: none  DO NOT TAKE ANY ORAL DIABETIC MEDICATIONS DAY OF YOUR SURGERY  Bring CPAP mask and tubing day of surgery.                              You may not have any metal on your body including hair pins, jewelry, and body piercing             Do not wear make-up, lotions, powders, perfumes/cologne, or deodorant  Do not wear nail polish including gel and S&S, artificial/acrylic nails, or any other type of covering on natural nails including finger and toenails. If you have artificial nails, gel coating, etc. that needs  to be removed by a nail salon please have this removed prior to surgery or surgery may need to be canceled/ delayed if the surgeon/ anesthesia feels like they are unable to be safely monitored.   Do not shave  48 hours prior to surgery.    Do not bring valuables to the hospital. [place].   Contacts, dentures or bridgework may not be worn into surgery.   Bring small overnight bag day of surgery.   DO NOT [place]. PHARMACY WILL DISPENSE MEDICATIONS LISTED ON YOUR MEDICATION LIST TO YOU DURING YOUR ADMISSION [place]!    Patients discharged on the day of surgery will not be allowed to drive home.  Someone NEEDS to stay with you for the first 24 hours after anesthesia.   Special Instructions: Bring a copy of your healthcare power  of attorney and living will documents  the day of surgery if you haven't scanned them before.              Please read over the following fact sheets you were given: IF YOU HAVE QUESTIONS ABOUT YOUR PRE-OP INSTRUCTIONS PLEASE CALL [phone]     [facility] - Preparing for Surgery Before surgery, you can play an important role.  Because skin is not sterile, your skin needs to be as free of germs as possible.  You can reduce the number of germs on your skin by washing with CHG (chlorahexidine gluconate) soap before surgery.  CHG is an antiseptic cleaner which kills germs and bonds with the skin to continue killing germs even after washing. Please DO NOT use if you have an allergy to CHG or antibacterial soaps.  If your skin becomes reddened/irritated stop using the CHG and inform your nurse when you arrive at Short Stay. Do not shave (including legs and underarms) for at least 48 hours prior to the first CHG shower.   Please follow these instructions carefully:  1.  Shower with CHG Soap the night before surgery and the  morning of Surgery.  2.  If you choose to wash your  hair, wash your hair first as usual with your  normal  shampoo.  3.  After you shampoo, rinse your hair and body thoroughly to remove the  shampoo.                            4.  Use CHG as you would any other liquid soap.  You can apply chg directly  to the skin and wash                       Gently with a scrungie or clean washcloth.  5.  Apply the CHG Soap to your body ONLY FROM THE NECK DOWN.   Do not use on face/ open                           Wound or open sores. Avoid contact with eyes, ears mouth and genitals (private parts).                       Wash face,  Genitals (private parts) with your normal soap.             6.  Wash thoroughly, paying special attention to the area where your surgery  will be performed.  7.  Thoroughly rinse your body with warm water from the neck down.  8.  DO NOT shower/wash with your normal soap after using and rinsing off  the CHG Soap.                9.  Pat yourself dry with a clean towel.            10.  Wear clean pajamas.            11.  Place clean sheets on your bed the night of your first shower and do not  sleep with pets. Day of Surgery : Do not apply any lotions/deodorants the morning of surgery.  Please wear clean clothes to the hospital/surgery center.  FAILURE TO FOLLOW THESE INSTRUCTIONS MAY RESULT IN THE CANCELLATION OF YOUR SURGERY    ________________________________________________________________________

## 2021-11-12 ENCOUNTER — Ambulatory Visit (INDEPENDENT_AMBULATORY_CARE_PROVIDER_SITE_OTHER): Payer: 59 | Admitting: Podiatry

## 2021-11-12 DIAGNOSIS — M25571 Pain in right ankle and joints of right foot: Secondary | ICD-10-CM

## 2021-11-13 ENCOUNTER — Encounter (HOSPITAL_COMMUNITY)
Admission: RE | Admit: 2021-11-13 | Discharge: 2021-11-13 | Disposition: A | Discharge status: Home / Self Care | Payer: 59 | Source: Ambulatory Visit | Attending: Internal Medicine | Admitting: Internal Medicine

## 2021-11-13 ENCOUNTER — Other Ambulatory Visit: Payer: Self-pay | Admitting: Obstetrics and Gynecology

## 2021-11-13 NOTE — Assessment & Plan Note (Signed)
Seeing endo and doing well with control. Using mounjaro currently.

## 2021-11-13 NOTE — Assessment & Plan Note (Signed)
Overall stable and they are still struggling with getting disability. She does have medicaid now which is helping. This is causing stress for patient and her mom.

## 2021-11-13 NOTE — Assessment & Plan Note (Signed)
Flu shot given. Covid-19 counseled. Tetanus up to date. Pap smear will be done under sedation upcoming. Counseled about sun safety and mole surveillance. Counseled about the dangers of distracted driving. Given 10 year screening recommendations.

## 2021-11-16 ENCOUNTER — Ambulatory Visit: Payer: 59 | Attending: Orthopedic Surgery | Admitting: Occupational Therapy

## 2021-11-16 DIAGNOSIS — M79641 Pain in right hand: Secondary | ICD-10-CM | POA: Diagnosis present

## 2021-11-16 DIAGNOSIS — M6281 Muscle weakness (generalized): Secondary | ICD-10-CM | POA: Diagnosis present

## 2021-11-16 DIAGNOSIS — R278 Other lack of coordination: Secondary | ICD-10-CM | POA: Diagnosis present

## 2021-11-16 DIAGNOSIS — R208 Other disturbances of skin sensation: Secondary | ICD-10-CM

## 2021-11-16 DIAGNOSIS — M79642 Pain in left hand: Secondary | ICD-10-CM | POA: Diagnosis present

## 2021-11-16 NOTE — Therapy (Signed)
OUTPATIENT OCCUPATIONAL THERAPY ORTHO EVALUATION  Patient Name: Deanna Levine MRN: [id number] DOB:08/03/2000, 21 y.o., female Today's Date: 11/16/2021  PCP: [name], MD REFERRING PROVIDER: Sherilyn Cooter, MD    OT End of Session - 11/16/21 1020     Visit Number 1    Authorization Type [employer] & M[employer]    Authorization Time Period VL: VM    OT Start Time 1018    OT Stop Time 1050    OT Time Calculation (min) 32 min    Activity Tolerance Patient tolerated treatment well    Behavior During Therapy WFL for tasks assessed/performed            Past Medical History:  Diagnosis Date   Absence seizure disorder ([place])    Allergy    Colitis 01/74/9449   Complication of anesthesia    slow to wake up from anesthesia, occ HA with anesthesia    Diabetes mellitus without complication (HCC)    type 2   Difficulty swallowing pills    Dyspraxia    Eczema    Global developmental delay    Obesity (BMI 30-39.9)    PONV (postoperative nausea and vomiting)    Precocious female puberty    Seizures ([place])    last seizure 2012 - no current med   Sleep apnea    White-Sutton syndrome    Past Surgical History:  Procedure Laterality Date   ADENOIDECTOMY     BIOPSY  07/16/2021   Procedure: BIOPSY;  Surgeon: Thornton Park, MD;  Location: [place];  Service: Gastroenterology;;   bone removal Left 01/16/2021   foot   COLONOSCOPY WITH PROPOFOL N/A 07/16/2021   Procedure: COLONOSCOPY WITH PROPOFOL;  Surgeon: Thornton Park, MD;  Location: [place];  Service: Gastroenterology;  Laterality: N/A;   GASTROCNEMIUS RECESSION Bilateral 04/25/2020   Procedure: BILATERAL GASTROCNEMIUS RECESSION;  Surgeon: Newt Minion, MD;  Location: [place];  Service: Orthopedics;  Laterality: Bilateral;   SUPPRELIN IMPLANT  05/06/2011   Procedure: SUPPRELIN IMPLANT;  Surgeon: Jerilynn Mages. Gerald Stabs, MD;  Location: [place];  Service: Pediatrics;  Laterality: Right;   SUPPRELIN  IMPLANT Right 06/14/2013   Procedure: REMOVAL OF SUPPRELIN IMPLANT FROM RIGHT UPPER ARM;  Surgeon: Jerilynn Mages. Gerald Stabs, MD;  Location: [place];  Service: Pediatrics;  Laterality: Right;   TONSILLECTOMY     TONSILLECTOMY AND ADENOIDECTOMY     TYMPANOSTOMY TUBE PLACEMENT     x 2   Patient Active Problem List   Diagnosis Date Noted   OSA (obstructive sleep apnea) 09/30/2021   Bilateral hand numbness 09/15/2021   Acne vulgaris 08/26/2021   Noninfectious gastroenteritis    Bilateral arm pain 06/12/2021   DUB (dysfunctional uterine bleeding) 05/14/2021   Diarrhea 04/24/2021   Dysuria 04/24/2021   Sinusitis 04/24/2021   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   Other adverse food reactions, not elsewhere classified, subsequent encounter 08/20/2020   Other allergic rhinitis 08/20/2020   Allergic conjunctivitis of both eyes 08/20/2020   Encounter for general adult medical examination with abnormal findings 07/03/2020   Leg length discrepancy 06/05/2020   Hidradenitis suppurativa 06/05/2020   Intellectual disability 06/05/2020   Achilles tendon contracture, bilateral    Elevated sedimentation rate 02/07/2020   Pain in right ankle and joints of right foot 02/07/2020   Anemia 12/29/2018   Type 2 diabetes mellitus ([place]) 08/15/2017   Elevated triglycerides with high cholesterol 12/09/2016   Mild intellectual disability 08/16/2012   Laxity of ligament 08/16/2012   Delayed milestones 08/16/2012  Obesity 06/07/2011   Dyspepsia 02/11/2011   Acanthosis nigricans 02/11/2011   Global developmental delay    Petit mal without grand mal seizures ([place])    Dyspraxia     ONSET DATE: 10/22/21 (date of OT order)  REFERRING DIAG: R20.0 (ICD-10-CM) - Bilateral hand numbness   THERAPY DIAG:  Pain in left hand  Pain in right hand  Muscle weakness (generalized)  Other disturbances of skin sensation  Other lack of coordination  Rationale for Evaluation and Treatment  Rehabilitation  SUBJECTIVE:   SUBJECTIVE STATEMENT: Pt arrives to OP OT evaluation w/ primary concern related to persistent bilateral arm and hand pain. States she received the NCS/EMG about 1 month ago, which did not reveal any significant results. Pt reports OT was helpful and would like to return to "help her hands get better." Pt accompanied by: self and family member, who did not come back w/ pt to treatment room  PERTINENT HISTORY: Bilateral arm/hand pain and numbness; PMH includes White Sutton syndrome dx Sept 2022 w/ h/o developmental delay, intellectual disability, seizures, and obesity; OSA; DMT2; leg length discrepancy and congenital valgus deformity; bilateral gastrocnemius recession 2/2 achilles tendon contractures Feb 2022; sx of L foot Nov 2022 for calcaneal tarsal coalitions bilaterally  PRECAUTIONS: Fall  WEIGHT BEARING RESTRICTIONS No  PAIN: Are you having pain? Yes: NPRS scale: 7/10 Pain location: right hand Pain description: constant tingling Aggravating factors: "working around the house;" worse at night Relieving factors: nothing  FALLS: Has patient fallen in last 6 months? No  LIVING ENVIRONMENT: Lives with: lives with their family Lives in: House/apartment Stairs: Multi-level Has following equipment at home: Single point cane  PLOF: Independent with basic ADLs and Independent with gait  PATIENT GOALS: "Helping my hands get better...stop tingling"  OBJECTIVE:   HAND DOMINANCE: Left  ADLs: Overall ADLs: Pt reports Mod Ind w/ all BADLs  FUNCTIONAL OUTCOME MEASURES: Quick Dash: 45.5/100 Slight difficulty: doing heavy household chores; w/ work or other regular daily activities; sleeping Moderate difficulty: washing back Severe difficulty: opening a tight or new jar; performing recreational activities in which force or impact taken through UE; w/ impact on normal social activities No difficulty carrying a shopping bag or briefcase; using a knife to cut  food  UPPER EXTREMITY ROM: AROM of BUEs WFL (shoulder, elbow, wrist, hand)   UPPER EXTREMITY MMT:     MMT Right Eval - 9/18 Left Eval - 9/18  Wrist flexion 4/5 4/5  Wrist extension 4/5 4/5  Wrist ulnar deviation 4/5 4/5  Wrist radial deviation 4/5 4/5  (Blank rows = not tested)  HAND FUNCTION: Grip strength: Right: 39 lbs; Left: 35 lbs Lateral pinch: Right: 10 lbs, Left 15 lbs 3 point pinch: Right: 6 lbs, Left 8 lbs  COORDINATION: 9 Hole Peg test: Right: 32.77 sec; Left: 33.04 sec  SENSATION: WFL; within 3-4 mm via 2-point discrimination test Reports constant tingling and occasional numbness  EDEMA: No observable edema  COGNITION: Overall cognitive status: No family/caregiver present to determine baseline cognitive functioning   TODAY'S TREATMENT:  N/A   PATIENT EDUCATION: Educated on role and purpose of OT as well as potential interventions and goals for therapy based on initial evaluation findings. Reviewed full HEP and introduced wrist PROM/stretching. Person educated: Patient Education method: Explanation, Demonstration, and Handouts Education comprehension: verbalized understanding and needs further education   HOME EXERCISE PROGRAM: Isolated tendon excursion exercises (printed handout provided); - Hook fist exercise - Isolated PIPJ flexion/extension of each digit - Isolated DIPJ flexion/extension of  each digit - Composite thumb opposition w/ flexion/extension  GOALS: Goals reviewed with patient? Yes  SHORT TERM GOALS: Target date: 12/07/21   STG  Status:  1 Pt will demonstrate understanding of HEP designed for bilateral hand strength, ROM, and symptom management  Initial    LONG TERM GOALS: Target date: 12/28/21  LTG  Status:  1 Pt will report overall pain within past week at < 4/10 by d/c for improved activity tolerance  Initial  2 Pt will report being able to open a tight or new jar w/ moderate difficulty or less to indicate decreased impact of  BUE pain on functional activities Baseline: severe difficulty Initial  3 Pt will demonstrate independence w/ compensatory strategies, including AE prn (built-up handles, ergonomic tools, gloves), to decrease impact of pain on functional tasks  Initial  4 Pt will improve grip strength in both hands by at least 5 lbs for increased functional use in housekeeping tasks and other IADLs  Baseline: Right: 39 lbs; Left: 35 lbs   09/07/21: Right: 57 lbs; Left: 43 lbs Initial    ASSESSMENT:  CLINICAL IMPRESSION: Pt is a 21 y/o who presents to OP OT due to bilateral arm and hand pain. PMH includes White Angela Adam syndrome dx Sept 2022 w/ developmental delay, intellectual disability, seizures, and obesity; h/o seizures; OSA; DMT2; leg length discrepancy and congenital valgus deformity; bilateral gastrocnemius recession 2/2 achilles tendon contractures Feb 2022; sx of L foot Nov 2022 for calcaneal tarsal coalitions bilaterally. Pt previously received OP OT in July of this year, but was not making significant progress so therapy was put on-hold until after receiving NCS/EMG. This procedure did not suggest evidence of nerve entrapment, brachial plexopathy, or cervical radiculopathy. OT discussed this w/ pt, providing education on shifting of OT focus from restoration to primarily compensation and modification; pt verbalized understanding. Pt will benefit from short course of skilled occupational therapy services to address strength, ROM, pain management, altered sensation, introduction of compensatory strategies/AE prn, and implementation of an HEP for symptom management and to improve functional use of BUEs during both BADLs and IADLs.  PERFORMANCE DEFICITS in functional skills including coordination, sensation, ROM, strength, pain, body mechanics, decreased knowledge of use of DME, and UE functional use.  IMPAIRMENTS are limiting patient from ADLs, IADLs, and leisure.   COMORBIDITIES has co-morbidities such as  White Sutton syndrome w/ developmental delay, intellectual disability, and h/o seizures   that affects occupational performance. Patient will benefit from skilled OT to address above impairments and improve overall function.  MODIFICATION OR ASSISTANCE TO COMPLETE EVALUATION: No modification of tasks or assist necessary to complete an evaluation.  OT OCCUPATIONAL PROFILE AND HISTORY: Problem focused assessment: Including review of records relating to presenting problem.  CLINICAL DECISION MAKING: LOW - limited treatment options, no task modification necessary  REHAB POTENTIAL: Fair - due to persistent of pain w/ no clear etiology and limitations w/ carryover of learned strategies  EVALUATION COMPLEXITY: Low    PLAN: OT FREQUENCY: 1x/week  OT DURATION: 6 weeks  PLANNED INTERVENTIONS: self care/ADL training, therapeutic exercise, therapeutic activity, manual therapy, splinting, ultrasound, paraffin, fluidotherapy, moist heat, cryotherapy, patient/family education, and DME and/or AE instructions  RECOMMENDED OTHER SERVICES: Currently receiving PT at another facility  CONSULTED AND AGREED WITH PLAN OF CARE: Patient and family member/caregiver  PLAN FOR NEXT SESSION: Review HEP; introduce body mechanics and AE/tools prn   Kathrine Cords, MSOT, OTR/L 11/16/2021, 10:52 AM

## 2021-11-16 NOTE — Progress Notes (Signed)
  Subjective:  Patient ID: Deanna Levine, female    DOB: 04/07/2000,  MRN: [id number]  Chief Complaint  Patient presents with   Follow-up    53mh for follow up on foot pain / subtalar joint arthritis.     21y.o. female returns for follow-up of right foot pain.  Right foot remains painful.  The left is doing well for her  Review of Systems: Negative except as noted in the HPI. Denies N/V/F/Ch.   Objective:  There were no vitals filed for this visit. There is no height or weight on file to calculate BMI. Constitutional Well developed. Well nourished.  Vascular Foot warm and well perfused. Capillary refill normal to all digits.   Neurologic Normal speech. Oriented to person, place, and time. Epicritic sensation to light touch grossly present bilaterally.  Dermatologic Incisions are well-healed on the left foot  Orthopedic: Today she has no pain with palpation of the sinus tarsi or range of motion of the subtalar joint on the left, right subtalar joint range of motion remains stiff and limited, painful and tender in the sinus tarsi   Multiple view plain film radiographs: No new films taken today Assessment:   1. Sinus tarsitis, right      Plan:  Patient was evaluated and treated and all questions answered.  Overall has had some improvement since our last visit.  The left side is relatively asymptomatic now.  I do hope that we can get her braces sorted out and get these to her soon.  I will have our office follow-up on this.  The right side is still tender and most of the pain was in the sinus tarsi area.  I recommended corticosteroid injection which we have not tried yet.  Advised on the risks of the hyperglycemia following a cortisone injection.  They wish to proceed with this today.  Following sterile prep with Betadine the right sinus tarsi was injected with 5 mg of Kenalog, 2 mg of dexamethasone and 0.5 cc each of 2% lidocaine and 0.5% Marcaine plain.  She tolerated well was  dressed with Band-Aid.  I will see her back in 3 months for follow-up  Return in about 3 months (around 02/11/2022) for follow up subtalar arthritis .

## 2021-11-17 ENCOUNTER — Encounter (HOSPITAL_COMMUNITY): Payer: Self-pay | Admitting: Gastroenterology

## 2021-11-17 ENCOUNTER — Telehealth: Payer: Self-pay | Admitting: Internal Medicine

## 2021-11-17 NOTE — Telephone Encounter (Signed)
Please advise on sleep study?

## 2021-11-17 NOTE — Telephone Encounter (Signed)
[facility] has notified us that insurance won't cover CPAP based on the low AHI recorded on HST.   Please order Split night sleep study at sleep center for dx OSA     Home Test was unreliable  Ask patient to call us for results 2 weeks after this test is done.

## 2021-11-17 NOTE — Telephone Encounter (Signed)
Please advise if ok to order a new sleep study?

## 2021-11-21 NOTE — Anesthesia Preprocedure Evaluation (Signed)
Anesthesia Evaluation    Reviewed: Allergy & Precautions, Patient's Chart, lab work & pertinent test results  History of Anesthesia Complications (+) PONV, PROLONGED EMERGENCE and history of anesthetic complications  Airway Mallampati: II  TM Distance: >3 FB Neck ROM: Full    Dental  (+) Dental Advisory Given   Pulmonary sleep apnea ,    Pulmonary exam normal        Cardiovascular negative cardio ROS Normal cardiovascular exam     Neuro/Psych Seizures -, Well Controlled,   White-Sutton syndrome  negative psych ROS   GI/Hepatic negative GI ROS, Neg liver ROS,   Endo/Other  diabetes, Type 2 Obesity   Renal/GU negative Renal ROS     Musculoskeletal negative musculoskeletal ROS (+)   Abdominal   Peds  (+) mental retardation Hematology negative hematology ROS (+)   Anesthesia Other Findings   Reproductive/Obstetrics  Precocious female puberty                            Anesthesia Physical Anesthesia Plan  ASA: 3  Anesthesia Plan: MAC   Post-op Pain Management: Minimal or no pain anticipated   Induction: Intravenous  PONV Risk Score and Plan: 3 and Ondansetron, Treatment may vary due to age or medical condition and Dexamethasone  Airway Management Planned: Nasal Cannula and Natural Airway  Additional Equipment: None  Intra-op Plan:   Post-operative Plan:   Informed Consent: I have reviewed the patients History and Physical, chart, labs and discussed the procedure including the risks, benefits and alternatives for the proposed anesthesia with the patient or authorized representative who has indicated his/her understanding and acceptance.       Plan Discussed with: CRNA and Anesthesiologist  Anesthesia Plan Comments: ( )      Anesthesia Quick Evaluation

## 2021-11-23 ENCOUNTER — Encounter (HOSPITAL_COMMUNITY): Payer: Self-pay | Admitting: Gastroenterology

## 2021-11-23 ENCOUNTER — Ambulatory Visit (HOSPITAL_COMMUNITY)
Admission: RE | Admit: 2021-11-23 | Discharge: 2021-11-23 | Disposition: A | Discharge status: Home / Self Care | Payer: 59 | Attending: Gastroenterology | Admitting: Gastroenterology

## 2021-11-23 ENCOUNTER — Encounter (HOSPITAL_COMMUNITY): Payer: Self-pay | Admitting: Certified Registered Nurse Anesthetist

## 2021-11-23 ENCOUNTER — Other Ambulatory Visit (HOSPITAL_COMMUNITY)
Admission: RE | Admit: 2021-11-23 | Discharge: 2021-11-23 | Disposition: A | Discharge status: Home / Self Care | Payer: 59 | Source: Ambulatory Visit | Attending: Obstetrics and Gynecology | Admitting: Obstetrics and Gynecology

## 2021-11-23 ENCOUNTER — Ambulatory Visit (HOSPITAL_COMMUNITY): Payer: 59 | Admitting: Anesthesiology

## 2021-11-23 ENCOUNTER — Telehealth: Payer: Self-pay

## 2021-11-23 ENCOUNTER — Other Ambulatory Visit: Payer: Self-pay

## 2021-11-23 ENCOUNTER — Encounter (HOSPITAL_COMMUNITY)
Admission: RE | Disposition: A | Discharge status: Home / Self Care | Payer: Self-pay | Source: Home / Self Care | Attending: Gastroenterology

## 2021-11-23 ENCOUNTER — Ambulatory Visit (HOSPITAL_BASED_OUTPATIENT_CLINIC_OR_DEPARTMENT_OTHER): Payer: 59 | Admitting: Anesthesiology

## 2021-11-23 DIAGNOSIS — R1310 Dysphagia, unspecified: Secondary | ICD-10-CM | POA: Diagnosis not present

## 2021-11-23 DIAGNOSIS — D509 Iron deficiency anemia, unspecified: Secondary | ICD-10-CM | POA: Diagnosis not present

## 2021-11-23 DIAGNOSIS — G4730 Sleep apnea, unspecified: Secondary | ICD-10-CM

## 2021-11-23 DIAGNOSIS — Z01419 Encounter for gynecological examination (general) (routine) without abnormal findings: Secondary | ICD-10-CM

## 2021-11-23 DIAGNOSIS — Z6835 Body mass index (BMI) 35.0-35.9, adult: Secondary | ICD-10-CM | POA: Insufficient documentation

## 2021-11-23 DIAGNOSIS — R933 Abnormal findings on diagnostic imaging of other parts of digestive tract: Secondary | ICD-10-CM | POA: Insufficient documentation

## 2021-11-23 DIAGNOSIS — R935 Abnormal findings on diagnostic imaging of other abdominal regions, including retroperitoneum: Secondary | ICD-10-CM

## 2021-11-23 DIAGNOSIS — Z113 Encounter for screening for infections with a predominantly sexual mode of transmission: Secondary | ICD-10-CM | POA: Insufficient documentation

## 2021-11-23 DIAGNOSIS — E11628 Type 2 diabetes mellitus with other skin complications: Secondary | ICD-10-CM

## 2021-11-23 DIAGNOSIS — E119 Type 2 diabetes mellitus without complications: Secondary | ICD-10-CM | POA: Insufficient documentation

## 2021-11-23 DIAGNOSIS — R197 Diarrhea, unspecified: Secondary | ICD-10-CM

## 2021-11-23 DIAGNOSIS — R1084 Generalized abdominal pain: Secondary | ICD-10-CM | POA: Diagnosis not present

## 2021-11-23 DIAGNOSIS — R109 Unspecified abdominal pain: Secondary | ICD-10-CM

## 2021-11-23 DIAGNOSIS — G40A09 Absence epileptic syndrome, not intractable, without status epilepticus: Secondary | ICD-10-CM | POA: Diagnosis not present

## 2021-11-23 DIAGNOSIS — E669 Obesity, unspecified: Secondary | ICD-10-CM | POA: Insufficient documentation

## 2021-11-23 HISTORY — PX: ESOPHAGOGASTRODUODENOSCOPY (EGD) WITH PROPOFOL: SHX5813

## 2021-11-23 HISTORY — PX: BALLOON DILATION: SHX5330

## 2021-11-23 HISTORY — PX: BIOPSY: SHX5522

## 2021-11-23 LAB — GLUCOSE, CAPILLARY: Glucose-Capillary: 128 mg/dL — ABNORMAL HIGH (ref 70–99)

## 2021-11-23 SURGERY — ESOPHAGOGASTRODUODENOSCOPY (EGD) WITH PROPOFOL
Anesthesia: General

## 2021-11-23 SURGERY — EXAM UNDER ANESTHESIA
Anesthesia: Choice

## 2021-11-23 MED ORDER — SODIUM CHLORIDE 0.9 % IV SOLN: INTRAVENOUS | Status: DC

## 2021-11-23 MED ORDER — PROPOFOL 10 MG/ML IV BOLUS
INTRAVENOUS | Status: DC | PRN
  Administered 2021-11-23: 20 mg via INTRAVENOUS
  Administered 2021-11-23: 50 mg via INTRAVENOUS
  Administered 2021-11-23: 20 mg via INTRAVENOUS

## 2021-11-23 MED ORDER — LACTATED RINGERS IV SOLN: INTRAVENOUS | Status: DC | PRN

## 2021-11-23 MED ORDER — FENTANYL CITRATE (PF) 100 MCG/2ML IJ SOLN
INTRAMUSCULAR | Status: DC | PRN
  Administered 2021-11-23 (×3): 25 ug via INTRAVENOUS

## 2021-11-23 MED ORDER — PROPOFOL 500 MG/50ML IV EMUL
INTRAVENOUS | Status: DC | PRN
  Administered 2021-11-23: 100 ug/kg/min via INTRAVENOUS

## 2021-11-23 MED ORDER — FENTANYL CITRATE (PF) 100 MCG/2ML IJ SOLN
INTRAMUSCULAR | Status: AC
  Filled 2021-11-23: qty 2

## 2021-11-23 MED ORDER — PROPOFOL 10 MG/ML IV BOLUS
INTRAVENOUS | Status: AC
  Filled 2021-11-23: qty 20

## 2021-11-23 MED ORDER — LIDOCAINE 2% (20 MG/ML) 5 ML SYRINGE
INTRAMUSCULAR | Status: DC | PRN
  Administered 2021-11-23: 100 mg via INTRAVENOUS

## 2021-11-23 MED ORDER — PHENYLEPHRINE 80 MCG/ML (10ML) SYRINGE FOR IV PUSH (FOR BLOOD PRESSURE SUPPORT)
PREFILLED_SYRINGE | INTRAVENOUS | Status: DC | PRN
  Administered 2021-11-23: 80 ug via INTRAVENOUS

## 2021-11-23 SURGICAL SUPPLY — 15 items

## 2021-11-23 NOTE — H&P (Signed)
Gynecology Surgical H&P   Date of Surgery: 11/23/2021    Primary OBGYN: [facility]  Reason for Admission: scheduled pap smear under anesthesia.   History of Present Illness: Ms. Deanna Levine is a 21 y.o. G0 (No LMP recorded. Patient has had an implant.), with the above CC.   Patient seen by me on 8/9 for new patient visit to establish care, AUB with nexplanon  and unable to tolerate pap smear, which was attempted because she is now 21.   ROS: A 12-point review of systems was performed and negative, except as stated in the above HPI.  OBGYN History: As per HPI. OB History  Gravida Para Term Preterm AB Living  0 0 0 0 0 0  SAB IAB Ectopic Multiple Live Births  0 0 0 0 0  Nexplanon in place  Past Medical History: Past Medical History:  Diagnosis Date   Absence seizure disorder ([place])    Allergy    Colitis 41/96/2229   Complication of anesthesia    slow to wake up from anesthesia, occ HA with anesthesia    Diabetes mellitus without complication (HCC)    type 2   Difficulty swallowing pills    Dyspraxia    Eczema    Global developmental delay    Obesity (BMI 30-39.9)    PONV (postoperative nausea and vomiting)    Precocious female puberty    Seizures ([place])    last seizure 2012 - no current med   Sleep apnea    White-Sutton syndrome     Past Surgical History: Past Surgical History:  Procedure Laterality Date   ADENOIDECTOMY     BIOPSY  07/16/2021   Procedure: BIOPSY;  Surgeon: Thornton Park, MD;  Location: [place];  Service: Gastroenterology;;   bone removal Left 01/16/2021   foot   COLONOSCOPY WITH PROPOFOL N/A 07/16/2021   Procedure: COLONOSCOPY WITH PROPOFOL;  Surgeon: Thornton Park, MD;  Location: [place];  Service: Gastroenterology;  Laterality: N/A;   GASTROCNEMIUS RECESSION Bilateral 04/25/2020   Procedure: BILATERAL GASTROCNEMIUS RECESSION;  Surgeon: Newt Minion, MD;  Location: [place];  Service:  Orthopedics;  Laterality: Bilateral;   SUPPRELIN IMPLANT  05/06/2011   Procedure: SUPPRELIN IMPLANT;  Surgeon: Jerilynn Mages. Gerald Stabs, MD;  Location: [place];  Service: Pediatrics;  Laterality: Right;   SUPPRELIN IMPLANT Right 06/14/2013   Procedure: REMOVAL OF SUPPRELIN IMPLANT FROM RIGHT UPPER ARM;  Surgeon: Jerilynn Mages. Gerald Stabs, MD;  Location: [place];  Service: Pediatrics;  Laterality: Right;   TONSILLECTOMY     TONSILLECTOMY AND ADENOIDECTOMY     TYMPANOSTOMY TUBE PLACEMENT     x 2    Family History:  Family History  Problem Relation Age of Onset   Diabetes Mother        steroid induced; hx. colitis   Anesthesia problems Mother        severe headache lasting 2-3 days   Rheum arthritis Mother    Ulcerative colitis Mother    Fibromyalgia Mother    Henoch-Schonlein purpura Brother        in remission   Hypertension Maternal Grandmother    Hyperlipidemia Maternal Grandmother    Diabetes Maternal Grandmother    Kidney disease Maternal Grandfather    Hypertension Maternal Grandfather    Heart disease Maternal Grandfather    Hyperlipidemia Maternal Grandfather    Kidney disease Paternal Grandmother    Hypertension Paternal Grandmother    Diabetes Paternal Grandmother    Heart  disease Paternal Grandmother    Hyperlipidemia Paternal Grandmother    Hypertension Paternal Grandfather    Hyperlipidemia Paternal Grandfather    Asthma Maternal Aunt    Seizures Maternal Uncle    Colon cancer Other        great aunt   Esophageal cancer Neg Hx    Esophageal varices Neg Hx    Pancreatic disease Neg Hx     Social History:  Social History   Socioeconomic History   Marital status: Single    Spouse name: Not on file   Number of children: 0   Years of education: 12   Highest education level: Not on file  Occupational History   Occupation: [employer]  Tobacco Use   Smoking status: Never    Passive exposure: Never   Smokeless tobacco: Never   Vaping Use   Vaping Use: Never used  Substance and Sexual Activity   Alcohol use: No    Alcohol/week: 0.0 standard drinks of alcohol   Drug use: No   Sexual activity: Never    Birth control/protection: None  Other Topics Concern   Not on file  Social History Narrative   Deanna Levine is graduated. [name] doing average.    She enjoys playing softball and basketball.   Lives with her parents.    Not working.   Left handed   Two story home      Had surgery on her legs in Feb and is physical therapy twice a week.   Left handed   No caffeine   Social Determinants of Health   Financial Resource Strain: Not on file  Food Insecurity: Not on file  Transportation Needs: Not on file  Physical Activity: Not on file  Stress: Not on file  Social Connections: Not on file  Intimate Partner Violence: Not on file   Allergy: No Known Allergies  Current Outpatient Medications: Medications Prior to Admission  Medication Sig Dispense Refill Last Dose   acetaminophen (TYLENOL) 160 MG/5ML elixir Take 960 mg by mouth every 4 (four) hours as needed (headache).      azelastine (ASTELIN) 0.1 % nasal spray Place 1 spray into both nostrils 2 (two) times daily. Use in each nostril as directed (Patient taking differently: Place 1 spray into both nostrils 2 (two) times daily as needed for rhinitis. Use in each nostril as directed) 30 mL 12    cetirizine (ZYRTEC) 10 MG tablet Take 10 mg by mouth daily as needed for allergies.      chlorhexidine (HIBICLENS) 4 % external liquid Apply topically daily as needed. (Patient taking differently: Apply topically in the morning and at bedtime.) 120 mL 0    Cholecalciferol (VITAMIN D3 GUMMIES ADULT PO) Take 2 tablets by mouth daily.      Clindamycin-Benzoyl Per, Refr, gel Use as directed up to twice a day (Patient taking differently: Apply 1 Application topically in the morning.) 45 g 3    dicyclomine (BENTYL) 20 MG tablet Take a half tablet 4 times daily before meals. 60  tablet 3    etonogestrel (NEXPLANON) 68 MG IMPL implant 1 each (68 mg total) by Subdermal route once. 1 each 0    fluticasone (FLONASE) 50 MCG/ACT nasal spray SHAKE LIQUID AND USE 2 SPRAYS IN EACH NOSTRIL DAILY 48 g 1    ibuprofen (ADVIL) 200 MG tablet Take 600 mg by mouth every 6 (six) hours as needed for moderate pain.      meloxicam (MOBIC) 15 MG tablet Take 15 mg by  mouth daily.      Menaquinone-7 (VITAMIN K2) 100 MCG CAPS Take 100 mcg by mouth daily.      mesalamine (PENTASA) 250 MG CR capsule Take 4 capsules (1,000 mg total) by mouth 3 (three) times daily. 360 capsule 5    Metamucil Fiber CHEW Chew 1 tablet by mouth daily.      norethindrone (AYGESTIN) 5 MG tablet Take 1 tablet (5 mg total) by mouth 2 (two) times daily as needed (bleeding). For spotting take 1 tab daily. If flow take 2 tabs daily. If heavy bleeding take 3 tabs daily. When bleeding has stopped you can stop the medication. 180 tablet 3    olopatadine (PATANOL) 0.1 % ophthalmic solution Place 1 drop into both eyes 2 (two) times daily as needed for allergies.      spironolactone (ALDACTONE) 50 MG tablet Take 50 mg by mouth daily.      tirzepatide (MOUNJARO) 12.5 MG/0.5ML Pen Inject 12.5 mg into the skin once a week 6 mL 1    triamcinolone cream (KENALOG) 0.1 % Apply 1 Application topically 2 (two) times daily.      Zonisamide 100 MG/5ML SUSP Take 68m every night 150 mL 11    Continuous Blood Gluc Receiver ([place] DEVI 1 each by Does not apply route as directed. Use with G7 sensor to check blood sugars 1 each 1    Continuous Blood Gluc Sensor (DEXCOM G7 SENSOR) MISC Inject 1 Device into the skin as directed. Change sensor every 10 days. 3 each 5    Na Sulfate-K Sulfate-Mg Sulf 17.5-3.13-1.6 GM/177ML SOLN See admin instructions.      ondansetron (ZOFRAN-ODT) 4 MG disintegrating tablet Take 1 tablet (4 mg total) by mouth every 8 (eight) hours as needed for nausea or vomiting. 30 tablet 3      Hospital  Medications: Current Facility-Administered Medications  Medication Dose Route Frequency Provider Last Rate Last Admin   0.9 %  sodium chloride infusion   Intravenous Continuous BThornton Park MD         Physical Exam:  Current Vital Signs 24h Vital Sign Ranges  T (!) 97.5 F (36.4 C) Temp  Avg: 97.5 F (36.4 C)  Min: 97.5 F (36.4 C)  Max: 97.5 F (36.4 C)  BP 138/81 BP  Min: 138/81  Max: 138/81  HR 87 Pulse  Avg: 87  Min: 87  Max: 87  RR (!) 25 Resp  Avg: 25  Min: 25  Max: 25  SaO2 99 % Room Air SpO2  Avg: 99 %  Min: 99 %  Max: 99 %       24 Hour I/O Current Shift I/O  Time Ins Outs No intake/output data recorded. No intake/output data recorded.    Body mass index is 35.19 kg/m. From 8/9 General appearance: Well nourished, well developed female in no acute distress.  Cardiovascular: normal s1 and s2.  No murmurs, rubs or gallops. Respiratory:  Clear to auscultation bilateral. Normal respiratory effort Abdomen: obese, soft, nttp Breasts: no lumps, bumps, nipple changes or abnormalities except healing lesions c/w hidranitis under the breasts and near the sternum. Healed ones and old scars seen in the axilla bilaterally Neuro/Psych:  Normal mood and affect.  Skin:  Warm and dry.  Lymphatic:  No inguinal lymphadenopathy.    Pelvic exam: is limited by body habitus EGBUS: within normal limits Vagina: unable to view completely b/c patient very intolerant of exam Cervix: not seen Bimanual: very limited by discomfort but cervix felt  normal and no CMT. I was unable to get a spatula to do a blind pap due to patient discomfort.     Laboratory: none  Imaging:  none  Assessment: Deanna Levine is a 21 y.o. G0P0000 (No LMP recorded. Patient has had an implant.) here for pap under anesthesia  Plan: D/w patient and mother. Can proceed when OR is ready   Durene Romans MD Cell [phone] Attending Center for [facility])

## 2021-11-23 NOTE — Brief Op Note (Signed)
11/23/2021  9:34 AM  PATIENT:  Deanna Levine  21 y.o. female  PRE-OPERATIVE DIAGNOSIS:  Abdominal pain, Abnormal CT scan  POST-OPERATIVE DIAGNOSIS:  * No post-op diagnosis entered *  PROCEDURE:  Procedure(s): ESOPHAGOGASTRODUODENOSCOPY (EGD) WITH PROPOFOL (N/A) EXAM UNDER ANESTHESIA PAP SMEAR  SURGEON:  Surgeon(s) and Role: Panel 1:    Thornton Park, MD - Primary Panel 2:    * Aletha Halim, MD - Primary  PHYSICIAN ASSISTANT:   ASSISTANTS: none   ANESTHESIA:   IV sedation  EBL:  none   BLOOD ADMINISTERED:none  DRAINS: none   LOCAL MEDICATIONS USED:  NONE  SPECIMEN:  pap smear  DISPOSITION OF SPECIMEN:  PATHOLOGY  COUNTS:  NO    TOURNIQUET:  * No tourniquets in log *  DICTATION: .Note written in EPIC  PLAN OF CARE: Discharge to home after PACU  PATIENT DISPOSITION:  PACU - hemodynamically stable.   Delay start of Pharmacological VTE agent (>24hrs) due to surgical blood loss or risk of bleeding: not applicable  Durene Romans MD Attending Center for [employer] (Faculty Practice) 11/23/2021 Time: [id number]

## 2021-11-23 NOTE — Anesthesia Procedure Notes (Signed)
Procedure Name: MAC Date/Time: 11/23/2021 9:24 AM  Performed by: Deliah Boston, CRNAPre-anesthesia Checklist: Patient identified, Emergency Drugs available, Suction available and Patient being monitored Patient Re-evaluated:Patient Re-evaluated prior to induction Oxygen Delivery Method: Nasal cannula Induction Type: IV induction Placement Confirmation: positive ETCO2 and breath sounds checked- equal and bilateral

## 2021-11-23 NOTE — Discharge Instructions (Signed)
YOU HAD AN ENDOSCOPIC PROCEDURE TODAY: Refer to the procedure report and other information in the discharge instructions given to you for any specific questions about what was found during the examination. If this information does not answer your questions, please call [place] office at [phone] to clarify.  ° °YOU SHOULD EXPECT: Some feelings of bloating in the abdomen. Passage of more gas than usual. Walking can help get rid of the air that was put into your GI tract during the procedure and reduce the bloating. If you had a lower endoscopy (such as a colonoscopy or flexible sigmoidoscopy) you may notice spotting of blood in your stool or on the toilet paper. Some abdominal soreness may be present for a day or two, also. ° °DIET: Your first meal following the procedure should be a light meal and then it is ok to progress to your normal diet. A half-sandwich or bowl of soup is an example of a good first meal. Heavy or fried foods are harder to digest and may make you feel nauseous or bloated. Drink plenty of fluids but you should avoid alcoholic beverages for 24 hours. If you had a esophageal dilation, please see attached instructions for diet.   ° °ACTIVITY: Your care partner should take you home directly after the procedure. You should plan to take it easy, moving slowly for the rest of the day. You can resume normal activity the day after the procedure however YOU SHOULD NOT DRIVE, use power tools, machinery or perform tasks that involve climbing or major physical exertion for 24 hours (because of the sedation medicines used during the test).  ° °SYMPTOMS TO REPORT IMMEDIATELY: °A gastroenterologist can be reached at any hour. Please call [phone]  for any of the following symptoms:  °Following lower endoscopy (colonoscopy, flexible sigmoidoscopy) °Excessive amounts of blood in the stool  °Significant tenderness, worsening of abdominal pains  °Swelling of the abdomen that is new, acute  °Fever of 100° or  higher  °Following upper endoscopy (EGD, EUS, ERCP, esophageal dilation) °Vomiting of blood or coffee ground material  °New, significant abdominal pain  °New, significant chest pain or pain under the shoulder blades  °Painful or persistently difficult swallowing  °New shortness of breath  °Black, tarry-looking or red, bloody stools ° °FOLLOW UP:  °If any biopsies were taken you will be contacted by phone or by letter within the next 1-3 weeks. Call [phone]  if you have not heard about the biopsies in 3 weeks.  °Please also call with any specific questions about appointments or follow up tests. ° °

## 2021-11-23 NOTE — Transfer of Care (Signed)
Immediate Anesthesia Transfer of Care Note  Patient: Deanna Levine  Procedure(s) Performed: Procedure(s): ESOPHAGOGASTRODUODENOSCOPY (EGD) WITH PROPOFOL (N/A) EXAM UNDER ANESTHESIA PAP SMEAR BALLOON DILATION (N/A) BIOPSY  Patient Location: PACU  Anesthesia Type:MAC  Level of Consciousness: Patient easily awoken, sedated, comfortable, cooperative, following commands, responds to stimulation.   Airway & Oxygen Therapy: Patient spontaneously breathing, ventilating well, oxygen via simple oxygen mask.  Post-op Assessment: Report given to PACU RN, vital signs reviewed and stable, moving all extremities.   Post vital signs: Reviewed and stable.  Complications: No apparent anesthesia complications  Last Vitals:  Vitals Value Taken Time  BP 160/89 11/23/21 1000  Temp    Pulse 87 11/23/21 1002  Resp 25 11/23/21 1002  SpO2 99 % 11/23/21 1002  Vitals shown include unvalidated device data.  Last Pain:  Vitals:   11/23/21 0829  TempSrc: Temporal  PainSc: 0-No pain         Complications: No notable events documented.

## 2021-11-23 NOTE — Op Note (Addendum)
[facility] Patient Name: Deanna Levine Procedure Date: 11/23/2021 MRN: [id number] Attending MD: Thornton Park MD, MD Date of Birth: 10-29-2000 CSN: [id number] Age: 21 Admit Type: Outpatient Procedure:                Upper GI endoscopy Indications:              Abdominal pain, Dysphagia, Abnormal CT of the GI                            tract (wall thickening noted in the pylorus/gastric                            antrum), Diarrhea Providers:                Thornton Park MD, MD, Benay Pillow, RN,                            Fransico Setters Mbumina, Technician Referring MD:              Medicines:                Monitored Anesthesia Care Complications:            No immediate complications. Estimated Blood Loss:     Estimated blood loss was minimal. Procedure:                Pre-Anesthesia Assessment:                           - Prior to the procedure, a History and Physical                            was performed, and patient medications and                            allergies were reviewed. The patient's tolerance of                            previous anesthesia was also reviewed. The risks                            and benefits of the procedure and the sedation                            options and risks were discussed with the patient.                            All questions were answered, and informed consent                            was obtained. Prior Anticoagulants: The patient has                            taken no previous anticoagulant or antiplatelet  agents. ASA Grade Assessment: III - A patient with                            severe systemic disease. After reviewing the risks                            and benefits, the patient was deemed in                            satisfactory condition to undergo the procedure.                           After obtaining informed consent, the endoscope was                             passed under direct vision. Throughout the                            procedure, the patient's blood pressure, pulse, and                            oxygen saturations were monitored continuously. The                            GIF-H190 ([id number]) Olympus endoscope was introduced                            through the mouth, and advanced to the third part                            of duodenum. The upper GI endoscopy was                            accomplished without difficulty. The patient                            tolerated the procedure well. Scope In: Scope Out: Findings:      No endoscopic abnormality was evident in the esophagus to explain the       patient's complaint of dysphagia. It was decided, however, to proceed       with dilation of the lower third of the esophagus. A TTS dilator was       passed through the scope. Dilation with a 15-16.5-18 mm balloon dilator       was performed to 18 mm. There was no resistance to the fully inflated       balloon. The dilation site was examined and showed no change. After       dilation, biopsies were obtained from the proximal and distal esophagus       with cold forceps for histology of suspected eosinophilic esophagitis.      The entire examined stomach was normal except for a small amount of       residual food fibers. There was no obvious mucosal abnormality in the       area of question from the recent CT scan. Biopsies were  taken from the       antrum, body, and fundus with a cold forceps for histology. Estimated       blood loss was minimal.      The examined duodenum was normal. Biopsies were taken with a cold       forceps for histology. Estimated blood loss was minimal.      The cardia and gastric fundus were normal on retroflexion.      The exam was otherwise without abnormality. Impression:               - No endoscopic esophageal abnormality to explain                            patient's dysphagia. Esophagus dilated.  Biopsied.                           - Residual foods fibers in the stomach.                           - Normal stomach. Biopsied.                           - Normal examined duodenum. Biopsied.                           - The examination was otherwise normal. Moderate Sedation:      Not Applicable - Patient had care per Anesthesia. Recommendation:           - Patient has a contact number available for                            emergencies. The signs and symptoms of potential                            delayed complications were discussed with the                            patient. Return to normal activities tomorrow.                            Written discharge instructions were provided to the                            patient.                           - Resume previous diet.                           - Continue present medications. Please start taking                            your Lialda.                           - Await pathology results.                           -  I will arrange office follow-up at [facility]. Procedure Code(s):        --- Professional ---                           [phone], Esophagogastroduodenoscopy, flexible,                            transoral; with transendoscopic balloon dilation of                            esophagus (less than 30 mm diameter)                           43239, 59,51, Esophagogastroduodenoscopy, flexible,                            transoral; with biopsy, single or multiple Diagnosis Code(s):        --- Professional ---                           R13.10, Dysphagia, unspecified                           R10.9, Unspecified abdominal pain                           R19.7, Diarrhea, unspecified                           R93.3, Abnormal findings on diagnostic imaging of                            other parts of digestive tract CPT copyright 2019 American Medical Association. All rights reserved. The codes documented in this report are preliminary  and upon coder review may  be revised to meet current compliance requirements. Thornton Park MD, MD 11/23/2021 10:07:54 AM This report has been signed electronically. Number of Addenda: 0

## 2021-11-23 NOTE — Telephone Encounter (Signed)
Patient's appointment for next week has been rescheduled to 12/28/21 at 9:00 am with Jaclyn Shaggy, NP. Pt notified via mychart.

## 2021-11-23 NOTE — Telephone Encounter (Signed)
-----   Message from Thornton Park, MD sent at 11/23/2021  9:56 AM EDT ----- Please arrange office follow-up with me or Colleen in 4-6 weeks.  Thanks.  KLB

## 2021-11-23 NOTE — Anesthesia Postprocedure Evaluation (Signed)
Anesthesia Post Note  Patient: Deanna Levine  Procedure(s) Performed: ESOPHAGOGASTRODUODENOSCOPY (EGD) WITH PROPOFOL BALLOON DILATION BIOPSY EXAM UNDER ANESTHESIA PAP SMEAR     Patient location during evaluation: PACU Anesthesia Type: MAC Level of consciousness: awake and alert Pain management: pain level controlled Vital Signs Assessment: post-procedure vital signs reviewed and stable Respiratory status: spontaneous breathing, nonlabored ventilation and respiratory function stable Cardiovascular status: stable and blood pressure returned to baseline Anesthetic complications: no   No notable events documented.  Last Vitals:  Vitals:   11/23/21 1003 11/23/21 1021  BP: 135/76 137/83  Pulse: 88 77  Resp: (!) 24 19  Temp:    SpO2: 99% 97%    Last Pain:  Vitals:   11/23/21 1021  TempSrc:   PainSc: 0-No pain                 Audry Pili

## 2021-11-23 NOTE — Op Note (Signed)
Operative Note   11/24/2021  PRE-OP DIAGNOSIS: Inability to tolerate in office pap smear   POST-OP DIAGNOSIS: Same  SURGEON:    Aletha Halim, MD  ASSISTANT: None  PROCEDURE:  Exam under anesthesia, pap smear  ANESTHESIA: IV sedation  ESTIMATED BLOOD LOSS: none  DRAINS: none  TOTAL IV FLUIDS: per anesthesia note  SPECIMENS: pap smear to pathology  VTE PROPHYLAXIS: None  ANTIBIOTICS: Not indicated  COMPLICATIONS: none  DISPOSITION: Remainder of case turned over to GI  CONDITION: stable  FINDINGS: Exam under anesthesia revealed small, anteverted, mobile uterus with no masses and bilateral adnexa without masses or fullness. Normal EGBUS. Speculum exam with scant, brown discharge, normal vagina, normal cervix  PROCEDURE IN DETAIL:  After informed consent was obtained, the patient was taken to the operating room where anesthesia was administered. She was then "frog-legged" and the above exam and pap smear done. Excellent hemostasis was noted, and all instruments were removed; she was then placed back into dorsal supine. The patient tolerated the procedure well, and the remainder of the procedure was turned over to GI  Durene Romans MD Attending Center for [facility])

## 2021-11-23 NOTE — H&P (Addendum)
Referring Provider: No ref. provider found Primary Care Physician:  [name], MD   Indication for EGD:  Abnormal CT scan    IMPRESSION:  Abnormal CT scan  PLAN: EGD   HPI: Deanna Levine is a 21 y.o. female presents for endoscopic evaluation of an abdominal pain and abnormal CT in the setting of microcytic anemia and chronic pill dysphagia.   Elevated CRP and fecal calprotectin levels. Colonoscopy 07/16/2021 showed evidence of colitis to the cecum, indeterminant IBD. She was prescribed Lialda but has not yet started it. Small bowel enterography 08/06/2021 showed the stomach was distended by ingested material with wall thickening versus focal peristalsis at the pylorus/gastric antrum suggestive of possible gastric hypomotility or outlet obstruction  She has chronic pill dysphagia and is easily gagged. Also reporting some solid food dysphagia.   She also has a history of mild microcytic anemia, Hg 11.2, MCV 67.8 with normal iron levels 01/2021. History of menorrhagia, recently had Nexplanon implant place.     Past Medical History:  Diagnosis Date   Absence seizure disorder ([place])    Allergy    Colitis 19/50/9326   Complication of anesthesia    slow to wake up from anesthesia, occ HA with anesthesia    Diabetes mellitus without complication (HCC)    type 2   Difficulty swallowing pills    Dyspraxia    Eczema    Global developmental delay    Obesity (BMI 30-39.9)    PONV (postoperative nausea and vomiting)    Precocious female puberty    Seizures ([place])    last seizure 2012 - no current med   Sleep apnea    White-Sutton syndrome     Past Surgical History:  Procedure Laterality Date   ADENOIDECTOMY     BIOPSY  07/16/2021   Procedure: BIOPSY;  Surgeon: Thornton Park, MD;  Location: [place];  Service: Gastroenterology;;   bone removal Left 01/16/2021   foot   COLONOSCOPY WITH PROPOFOL N/A 07/16/2021   Procedure: COLONOSCOPY WITH PROPOFOL;  Surgeon:  Thornton Park, MD;  Location: [place];  Service: Gastroenterology;  Laterality: N/A;   GASTROCNEMIUS RECESSION Bilateral 04/25/2020   Procedure: BILATERAL GASTROCNEMIUS RECESSION;  Surgeon: Newt Minion, MD;  Location: [place];  Service: Orthopedics;  Laterality: Bilateral;   SUPPRELIN IMPLANT  05/06/2011   Procedure: SUPPRELIN IMPLANT;  Surgeon: Jerilynn Mages. Gerald Stabs, MD;  Location: [place];  Service: Pediatrics;  Laterality: Right;   SUPPRELIN IMPLANT Right 06/14/2013   Procedure: REMOVAL OF SUPPRELIN IMPLANT FROM RIGHT UPPER ARM;  Surgeon: Jerilynn Mages. Gerald Stabs, MD;  Location: [facility];  Service: Pediatrics;  Laterality: Right;   TONSILLECTOMY     TONSILLECTOMY AND ADENOIDECTOMY     TYMPANOSTOMY TUBE PLACEMENT     x 2    Current Facility-Administered Medications  Medication Dose Route Frequency Provider Last Rate Last Admin   0.9 %  sodium chloride infusion   Intravenous Continuous Thornton Park, MD        Allergies as of 09/08/2021   (No Known Allergies)    Family History  Problem Relation Age of Onset   Diabetes Mother        steroid induced; hx. colitis   Anesthesia problems Mother        severe headache lasting 2-3 days   Rheum arthritis Mother    Ulcerative colitis Mother    Fibromyalgia Mother    Henoch-Schonlein purpura Brother        in remission  Hypertension Maternal Grandmother    Hyperlipidemia Maternal Grandmother    Diabetes Maternal Grandmother    Kidney disease Maternal Grandfather    Hypertension Maternal Grandfather    Heart disease Maternal Grandfather    Hyperlipidemia Maternal Grandfather    Kidney disease Paternal Grandmother    Hypertension Paternal Grandmother    Diabetes Paternal Grandmother    Heart disease Paternal Grandmother    Hyperlipidemia Paternal Grandmother    Hypertension Paternal Grandfather    Hyperlipidemia Paternal Grandfather    Asthma Maternal Aunt    Seizures Maternal Uncle     Colon cancer Other        great aunt   Esophageal cancer Neg Hx    Esophageal varices Neg Hx    Pancreatic disease Neg Hx      Physical Exam: General:   Alert,  well-nourished, pleasant and cooperative in NAD Head:  Normocephalic and atraumatic. Eyes:  Sclera clear, no icterus.   Conjunctiva pink. Mouth:  No deformity or lesions.   Neck:  Supple; no masses or thyromegaly. Lungs:  Clear throughout to auscultation.   No wheezes. Heart:  Regular rate and rhythm; no murmurs. Abdomen:  Soft, non-tender, nondistended, normal bowel sounds, no rebound or guarding.  Msk:  Symmetrical. No boney deformities LAD: No inguinal or umbilical LAD Extremities:  No clubbing or edema. Neurologic:  Alert and  oriented x4;  grossly nonfocal Skin:  No obvious rash or bruise. Psych:  Alert and cooperative. Normal mood and affect.     Studies/Results: No results found.    Deanna Levine L. Tarri Glenn, MD, MPH 11/23/2021, 8:28 AM

## 2021-11-24 ENCOUNTER — Ambulatory Visit (INDEPENDENT_AMBULATORY_CARE_PROVIDER_SITE_OTHER): Payer: 59 | Admitting: Pediatric Endocrinology

## 2021-11-24 ENCOUNTER — Encounter (INDEPENDENT_AMBULATORY_CARE_PROVIDER_SITE_OTHER): Payer: Self-pay | Admitting: Pediatric Endocrinology

## 2021-11-24 VITALS — BP 122/78 | HR 80 | Ht 64.29 in | Wt 205.8 lb

## 2021-11-24 DIAGNOSIS — Z978 Presence of other specified devices: Secondary | ICD-10-CM

## 2021-11-24 DIAGNOSIS — Z7985 Long-term (current) use of injectable non-insulin antidiabetic drugs: Secondary | ICD-10-CM

## 2021-11-24 DIAGNOSIS — E11649 Type 2 diabetes mellitus with hypoglycemia without coma: Secondary | ICD-10-CM

## 2021-11-24 DIAGNOSIS — N938 Other specified abnormal uterine and vaginal bleeding: Secondary | ICD-10-CM

## 2021-11-24 DIAGNOSIS — R569 Unspecified convulsions: Secondary | ICD-10-CM

## 2021-11-24 LAB — POCT GLYCOSYLATED HEMOGLOBIN (HGB A1C): HbA1c, POC (prediabetic range): 5.9 % (ref 5.7–6.4)

## 2021-11-24 LAB — SURGICAL PATHOLOGY

## 2021-11-24 LAB — POCT GLUCOSE (DEVICE FOR HOME USE): Glucose Fasting, POC: 146 mg/dL — AB (ref 70–99)

## 2021-11-24 NOTE — Progress Notes (Signed)
Subjective:  Subjective  Patient Name: Deanna Levine Date of Birth: 2000/09/25  MRN: [id number]  Deanna Levine  presents to clinic today for follow-up evaluation and management of her  obesity, prediabetes, acanthosis   HISTORY OF PRESENT ILLNESS:   Deanna Levine is a 21 y.o. [place] female   Adrionna is accompanied by dad today  1. Deanna Levine was initially followed in pediatric endocrine clinic for early puberty complicated by profound developmental delay. [name] now post menarchal and follows in clinic for Type 2 Diabetes and metabolic syndrome.    2. The patient's last [facility] visit was on 08/24/21  [name] now taking 12.5 mg of Mounjaro weekly. She feels that it is working well. Dad feels that she is still eating sufficiently to nourish her body.   She skipped the dose last week due to a scheduled endoscopy yesterday. She says that they were able to coordinate doing her Pap Smear at the same time while she was sedated.   [name] drinking water and zero sugar gatorade.   [name] doing workout videos from [employer] 1-2 times a week.   [name] only meant to spent 1-2 hours a day on her feet due to her Deanna Levine Syndrome and foot issues. Dad feels that she is on her feet more than that. [name] still having pain some but it has overall improved.   [name] not able to have a job. [name] also still waiting on disability.   [name] not wearing a CGM.   Her wrist is better. [name] wearing a brace at night. She has OT.   [name] unsure if she is having seizures at night. She had a 24 hour home EEG in February 2023 which showed that she was having bilateral nocturnal seizures. [name] on different medication now. [name] not sure when she will be having a repeat EEG. [name] no longer allowed to drive.   [name] due for a new sleep study. She was previously meant to be on CPAP but her insurance wouldn't cover it.   Her dermatologist prescribed Spironolactone - she is taking this now. [name] starting to see some  changes in her face hair.   [name] still having some breakthrough bleeding even with her Nexplanon and her Norethindrone. She has prolonged bleeding. She says that her GYN doc doesn't know what to do.   She will be seeing a virtual therapist for the first time on Friday.   She had the endoscopy due to gagging with taking medication and wanting to stretch the muscles in her throat. They have also been following her for ulcerative colitis.  -----   She has been diagnosed with White-Sutton Syndrome by genetics at [facility]. 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     3. Pertinent Review of Systems:  Constitutional: The patient feels "good". The patient seems healthy and active. Eyes: Vision seems to be good. There are no recognized eye problems. Wears glasses.  Neck: The patient has no complaints of anterior neck swelling, soreness, tenderness, pressure, discomfort, or difficulty swallowing.   Heart: Heart rate increases with exercise or other physical activity. The patient has no complaints of palpitations, irregular heart beats, chest pain, or chest pressure.   Lungs: no asthma or wheezing.  Gastrointestinal: Bowel movents seem normal. The patient has no complaints of excessive hunger, acid reflux, upset stomach, stomach aches or pains, diarrhea. Some constipation. [name] seeing a new GI doc  for increased gas.  Legs: Muscle mass and strength seem normal. There are no complaints of numbness, tingling, burning, or pain. No edema is noted.  Feet: Per HPI Neurologic: Has been having staring spells. [name] followed by [name] in adult neurology GYN/GU: Menarche 07/2014, age 3. LMP 9/16- she is still having flow.  Skin: sores on her chest. - under her breasts and her armpits. Derm gave her some cream and it is maybe helping. [name] seeing wound care on 9/27 due to bleeding from open wound on her chest.   Annual labs- done with PCP     PAST MEDICAL, FAMILY, AND SOCIAL HISTORY  Past Medical  History:  Diagnosis Date   Absence seizure disorder ([place])    Allergy    Colitis 78/67/5449   Complication of anesthesia    slow to wake up from anesthesia, occ HA with anesthesia    Diabetes mellitus without complication (HCC)    type 2   Difficulty swallowing pills    Dyspraxia    Eczema    Global developmental delay    Obesity (BMI 30-39.9)    PONV (postoperative nausea and vomiting)    Precocious female puberty    Seizures ([place])    last seizure 2012 - no current med   Sleep apnea    White-Sutton syndrome     Family History  Problem Relation Age of Onset   Diabetes Mother        steroid induced; hx. colitis   Anesthesia problems Mother        severe headache lasting 2-3 days   Rheum arthritis Mother    Ulcerative colitis Mother    Fibromyalgia Mother    Henoch-Schonlein purpura Brother        in remission   Hypertension Maternal Grandmother    Hyperlipidemia Maternal Grandmother    Diabetes Maternal Grandmother    Kidney disease Maternal Grandfather    Hypertension Maternal Grandfather    Heart disease Maternal Grandfather    Hyperlipidemia Maternal Grandfather    Kidney disease Paternal Grandmother    Hypertension Paternal Grandmother    Diabetes Paternal Grandmother    Heart disease Paternal Grandmother    Hyperlipidemia Paternal Grandmother    Hypertension Paternal Grandfather    Hyperlipidemia Paternal Grandfather    Asthma Maternal Aunt    Seizures Maternal Uncle    Colon cancer Other        great aunt   Esophageal cancer Neg Hx    Esophageal varices Neg Hx    Pancreatic disease Neg Hx      Current Outpatient Medications:    acetaminophen (TYLENOL) 160 MG/5ML elixir, Take 960 mg by mouth every 4 (four) hours as needed (headache)., Disp: , Rfl:    azelastine (ASTELIN) 0.1 % nasal spray, Place 1 spray into both nostrils 2 (two) times daily. Use in each nostril as directed (Patient taking differently: Place 1 spray into both nostrils 2 (two) times daily  as needed for rhinitis. Use in each nostril as directed), Disp: 30 mL, Rfl: 12   cetirizine (ZYRTEC) 10 MG tablet, Take 10 mg by mouth daily as needed for allergies., Disp: , Rfl:    chlorhexidine (HIBICLENS) 4 % external liquid, Apply topically daily as needed. (Patient taking differently: Apply topically in the morning and at bedtime.), Disp: 120 mL, Rfl: 0   Cholecalciferol (VITAMIN D3 GUMMIES ADULT PO), Take 2 tablets by mouth daily., Disp: , Rfl:    etonogestrel (NEXPLANON) 68 MG IMPL implant, 1  each (68 mg total) by Subdermal route once., Disp: 1 each, Rfl: 0   fluticasone (FLONASE) 50 MCG/ACT nasal spray, SHAKE LIQUID AND USE 2 SPRAYS IN EACH NOSTRIL DAILY, Disp: 48 g, Rfl: 1   ibuprofen (ADVIL) 200 MG tablet, Take 600 mg by mouth every 6 (six) hours as needed for moderate pain., Disp: , Rfl:    meloxicam (MOBIC) 15 MG tablet, Take 15 mg by mouth daily., Disp: , Rfl:    Menaquinone-7 (VITAMIN K2) 100 MCG CAPS, Take 100 mcg by mouth daily., Disp: , Rfl:    mesalamine (PENTASA) 250 MG CR capsule, Take 4 capsules (1,000 mg total) by mouth 3 (three) times daily., Disp: 360 capsule, Rfl: 5   Metamucil Fiber CHEW, Chew 1 tablet by mouth daily., Disp: , Rfl:    Na Sulfate-K Sulfate-Mg Sulf 17.5-3.13-1.6 GM/177ML SOLN, See admin instructions., Disp: , Rfl:    norethindrone (AYGESTIN) 5 MG tablet, Take 1 tablet (5 mg total) by mouth 2 (two) times daily as needed (bleeding). For spotting take 1 tab daily. If flow take 2 tabs daily. If heavy bleeding take 3 tabs daily. When bleeding has stopped you can stop the medication., Disp: 180 tablet, Rfl: 3   olopatadine (PATANOL) 0.1 % ophthalmic solution, Place 1 drop into both eyes 2 (two) times daily as needed for allergies., Disp: , Rfl:    ondansetron (ZOFRAN-ODT) 4 MG disintegrating tablet, Take 1 tablet (4 mg total) by mouth every 8 (eight) hours as needed for nausea or vomiting., Disp: 30 tablet, Rfl: 3   spironolactone (ALDACTONE) 50 MG tablet, Take 50  mg by mouth daily., Disp: , Rfl:    tirzepatide (MOUNJARO) 12.5 MG/0.5ML Pen, Inject 12.5 mg into the skin once a week, Disp: 6 mL, Rfl: 1   triamcinolone cream (KENALOG) 0.1 %, Apply 1 Application topically 2 (two) times daily., Disp: , Rfl:    Zonisamide 100 MG/5ML SUSP, Take 28m every night, Disp: 150 mL, Rfl: 11   Clindamycin-Benzoyl Per, Refr, gel, Use as directed up to twice a day (Patient not taking: Reported on 11/24/2021), Disp: 45 g, Rfl: 3   Continuous Blood Gluc Receiver (DD[place] 1 each by Does not apply route as directed. Use with G7 sensor to check blood sugars (Patient not taking: Reported on 11/24/2021), Disp: 1 each, Rfl: 1   Continuous Blood Gluc Sensor (DEXCOM G7 SENSOR) MISC, Inject 1 Device into the skin as directed. Change sensor every 10 days. (Patient not taking: Reported on 11/24/2021), Disp: 3 each, Rfl: 5   dicyclomine (BENTYL) 20 MG tablet, Take a half tablet 4 times daily before meals., Disp: 60 tablet, Rfl: 3  Allergies as of 11/24/2021   (No Known Allergies)     reports that she has never smoked. She has never been exposed to tobacco smoke. She has never used smokeless tobacco. She reports that she does not drink alcohol and does not use drugs. Pediatric History  Patient Parents   FGenola, Yuille(Mother)   FTharon AquasJr,Clifton (Father)   Other Topics Concern   Not on file  Social History Narrative   [employer] graduated. [name] doing average.    She enjoys playing softball and basketball.   Lives with her parents.    Not working.   Left handed   Two story home      Had surgery on her legs in Feb and is physical therapy twice a week.   Left handed   No caffeine   Unable to work.  Working on getting disability.   Primary Care Provider: [name], MD     Objective:  Objective   Vital Signs:      08/24/21 13:21  BP 112/74  Pulse Rate 80  Weight 217 lb  Height 5' 4.17" (1.63 m)  BMI (Calculated) 37.05     BP  122/78 (BP Location: Left Arm, Patient Position: Sitting, Cuff Size: Large)   Pulse 80   Ht 5' 4.29" (1.633 m)   Wt 205 lb 12.8 oz (93.4 kg)   LMP  (Exact Date)   BMI 35.01 kg/m  Growth %ile SmartLinks can only be used for patients less than 61 years old.    Ht Readings from Last 3 Encounters:  11/24/21 5' 4.29" (1.633 m)  11/23/21 5' 4"  (1.626 m)  11/11/21 5' 4.5" (1.638 m)   Wt Readings from Last 3 Encounters:  11/24/21 205 lb 12.8 oz (93.4 kg)  11/23/21 205 lb (93 kg)  11/11/21 207 lb (93.9 kg)   HC Readings from Last 3 Encounters:  07/30/21 26.85" (68.2 cm)   Body surface area is 2.06 meters squared. Facility age limit for growth %iles is 20 years. Facility age limit for growth %iles is 20 years.   PHYSICAL EXAM:    Constitutional: The patient appears healthy and well nourished. The patient's height and weight are advanced for age.  She has lost 12 pounds since last visit.  Head: The head is normocephalic. Face: The face appears normal. There are no obvious dysmorphic features. Eyes: The eyes appear to be normally formed and spaced. Gaze is conjugate. There is no obvious arcus or proptosis. Moisture appears normal. Ears: The ears are normally placed and appear externally normal. Mouth: The oropharynx and tongue appear normal. Dentition appears to be normal for age. Oral moisture is normal. Neck: The neck appears to be visibly normal. The thyroid gland is 13 grams in size. The consistency of the thyroid gland is normal. The thyroid gland is not tender to palpation. +2 acanthosis with thick scaling Lungs: no increased work of breathing Heart: regular pulses and peripheral perfusion Abdomen: The abdomen appears to be enlarged in size for the patient's age. There is no obvious hepatomegaly, splenomegaly, or other mass effect.  Arms: Muscle size and bulk are normal for age. Axillary acanthosis and hydradenitis (scarring) Hands: There is no obvious tremor. Palmar muscles are  normal for age. Palmar skin is normal. Palmar moisture is also normal. Swelling noted into proximal phalanges  Legs: Muscles appear normal for age. No edema is present.  Neurologic: Strength is normal for age in both the upper and lower extremities. Muscle tone is normal. Sensation to touch is normal in both the legs and feet.   GYN/GU: normal female Skin: She has multiple scars and a few open lesions under her breasts. She has scarring but no open lesions (one or two with white pus under the surface) in her axillae.    LAB DATA:    Lab Results  Component Value Date   HGBA1C 5.9 11/24/2021   HGBA1C 6.3 (H) 08/24/2021   HGBA1C 6.4 02/12/2021   HGBA1C 6.3 (A) 11/13/2020   HGBA1C 6.3 (A) 07/24/2020   HGBA1C 6.2 04/24/2020   HGBA1C 6.6 (A) 12/11/2019   HGBA1C 6.3 (A) 09/20/2019    Results for orders placed or performed in visit on 11/24/21  POCT Glucose (Device for Home Use)  Result Value Ref Range   Glucose Fasting, POC 146 (A) 70 - 99 mg/dL   POC  Glucose    POCT glycosylated hemoglobin (Hb A1C)  Result Value Ref Range   Hemoglobin A1C     HbA1c POC (<> result, manual entry)     HbA1c, POC (prediabetic range) 5.9 5.7 - 6.4 %   HbA1c, POC (controlled diabetic range)          Assessment and Plan:  Assessment  ASSESSMENT:   Kaleah is a 21 y.o. AA female with history of precocious puberty (which increases risk of W2BJ, PCOS, Metabolic syndrome) who now meets criteria for type 2 diabetes   Type 2 diabetes / hypoglycemic unawareness? / nocturnal seizures/ Skin issues - Diagnosed in January 2021 - Has the Dexcom G7 but has had issues starting it - New sensor placed in clinic today - A1C has been stable and within target of <6.5% - Currently on Mounjaro 12.5 mg.weekly.  - Current diagnosis of nocturnal seizure- not currently wearing cgm. Concern for nocturnal hypoglycemia. - will try again with CGM  DUB  - She has a Nexplanon in place but she is having DUB with this - Reviewed   that GLP1 medications decrease efficacy of contraceptive hormone - Norethindrone for breakthrough bleeding has helped some- but even with this she is bleeding.  - Discussed option for IUD- they will need to follow up with her GYN  Disability - Letter provided for family at her last visit stating that [place] is permanently disabled.  - Her application is in process.    PLAN:   1. Diagnostic: Orders Placed This Encounter  Procedures   POCT Glucose (Device for Home Use)   POCT glycosylated hemoglobin (Hb A1C)   COLLECTION CAPILLARY BLOOD SPECIMEN    2. Therapeutic: Continue Mounjaro 12.5 mg weekly  Dexcom G7 started in clinic AGAIN today.   3. Patient education: Lengthy discussion as above.  4. Follow-up: Return in about 3 months (around 02/23/2022).      Lelon Huh, MD  Level of Service: >40 minutes spent today reviewing the medical chart, counseling the patient/family, and documenting today's encounter.

## 2021-11-25 ENCOUNTER — Encounter (HOSPITAL_COMMUNITY): Payer: Self-pay | Admitting: Gastroenterology

## 2021-11-25 ENCOUNTER — Telehealth: Payer: Self-pay | Admitting: General Practice

## 2021-11-25 ENCOUNTER — Encounter (HOSPITAL_BASED_OUTPATIENT_CLINIC_OR_DEPARTMENT_OTHER): Payer: 59 | Attending: General Surgery | Admitting: General Surgery

## 2021-11-25 DIAGNOSIS — L732 Hidradenitis suppurativa: Secondary | ICD-10-CM | POA: Insufficient documentation

## 2021-11-25 DIAGNOSIS — E119 Type 2 diabetes mellitus without complications: Secondary | ICD-10-CM | POA: Insufficient documentation

## 2021-11-25 DIAGNOSIS — Q8789 Other specified congenital malformation syndromes, not elsewhere classified: Secondary | ICD-10-CM | POA: Insufficient documentation

## 2021-11-25 LAB — CYTOLOGY - PAP
Chlamydia: NEGATIVE
Comment: NEGATIVE
Comment: NEGATIVE
Comment: NEGATIVE
Comment: NORMAL
Diagnosis: UNDETERMINED — AB
High risk HPV: NEGATIVE
Neisseria Gonorrhea: NEGATIVE
Trichomonas: NEGATIVE

## 2021-11-25 NOTE — Telephone Encounter (Signed)
Called patient and informed her of results. Patient verbalized understanding.

## 2021-11-25 NOTE — Telephone Encounter (Signed)
-----   Message from Aletha Halim, MD sent at 11/25/2021 12:24 PM EDT ----- Can you let her mom and her know that everything came back fine and she will just need a routine pap smear in 3 years? thanks

## 2021-11-27 ENCOUNTER — Ambulatory Visit (INDEPENDENT_AMBULATORY_CARE_PROVIDER_SITE_OTHER): Payer: 59 | Admitting: Behavioral Health

## 2021-11-27 DIAGNOSIS — F419 Anxiety disorder, unspecified: Secondary | ICD-10-CM | POA: Diagnosis not present

## 2021-11-27 NOTE — Progress Notes (Signed)
Deanna Levine, Deanna Levine ([id number]) Visit Report for 11/25/2021 Abuse Risk Screen Details Patient Name: Date of Service: Deanna Levine, Deanna Levine 11/25/2021 2:00 PM Medical Record Number: [id number] Patient Account Number: [id number] Date of Birth/Sex: Treating RN: 2000-07-01 (21 y.o. Marta Lamas Primary Care Sara Keys: Pricilla Holm Other Clinician: Referring Chantee Cerino: Treating Ciella Obi/Extender: Durenda Age in Treatment: 0 Abuse Risk Screen Items Answer ABUSE RISK SCREEN: Has anyone close to you tried to hurt or harm you recentlyo No Do you feel uncomfortable with anyone in your familyo No Has anyone forced you do things that you didnt want to doo No Electronic Signature(s) Signed: 11/27/2021 4:38:59 PM By: Blanche East RN Entered By: Blanche East on 11/25/2021 14:05:36 -------------------------------------------------------------------------------- Activities of Daily Living Details Patient Name: Date of Service: Deanna Levine, Deanna Levine 11/25/2021 2:00 PM Medical Record Number: [id number] Patient Account Number: [id number] Date of Birth/Sex: Treating RN: June 15, 2000 (21 y.o. Marta Lamas Primary Care Babe Anthis: Pricilla Holm Other Clinician: Referring Aigner Horseman: Treating Debria Broecker/Extender: Durenda Age in Treatment: 0 Activities of Daily Living Items Answer Activities of Daily Living (Please select one for each item) Drive Automobile Need Assistance T Medications ake Need Assistance Use T elephone Completely Able Care for Appearance Completely Able Use T oilet Completely Able Bath / Shower Completely Able Dress Self Completely Able Feed Self Completely Able Walk Completely Able Get In / Out Bed Completely Able Housework Need Assistance Prepare Meals Need Assistance Handle Money Need Assistance Shop for Self Need Assistance Electronic Signature(s) Signed: 11/27/2021 4:38:59 PM By: Blanche East RN Entered By:  Blanche East on 11/25/2021 14:06:24 -------------------------------------------------------------------------------- Education Screening Details Patient Name: Date of Service: Deanna Levine, Deanna Levine 11/25/2021 2:00 PM Medical Record Number: [id number] Patient Account Number: [id number] Date of Birth/Sex: Treating RN: 31-May-2000 (21 y.o. Marta Lamas Primary Care Panzy Bubeck: Pricilla Holm Other Clinician: Referring Zeya Balles: Treating Simrah Chatham/Extender: Durenda Age in Treatment: 0 Learning Preferences/Education Level/Primary Language Learning Preference: Explanation Highest Education Level: High School Preferred Language: English Cognitive Barrier Language Barrier: No Translator Needed: No Memory Deficit: No Emotional Barrier: No Cultural/Religious Beliefs Affecting Medical Care: No Physical Barrier Impaired Vision: Yes Glasses Impaired Hearing: No Decreased Hand dexterity: No Knowledge/Comprehension Knowledge Level: Medium Comprehension Level: Medium Ability to understand written instructions: Medium Ability to understand verbal instructions: Medium Motivation Anxiety Level: Calm Cooperation: Cooperative Education Importance: Acknowledges Need Interest in Health Problems: Asks Questions Perception: Coherent Willingness to Engage in Self-Management Medium Activities: Readiness to Engage in Self-Management Medium Activities: Electronic Signature(s) Signed: 11/27/2021 4:38:59 PM By: Blanche East RN Entered By: Blanche East on 11/25/2021 14:07:16 -------------------------------------------------------------------------------- Fall Risk Assessment Details Patient Name: Date of Service: Deanna Levine, Deanna Levine 11/25/2021 2:00 PM Medical Record Number: [id number] Patient Account Number: [id number] Date of Birth/Sex: Treating RN: 02/12/2001 (21 y.o. Iver Nestle, Jamie Primary Care Jacory Kamel: Pricilla Holm Other Clinician: Referring  Bracy Pepper: Treating Aquita Simmering/Extender: Durenda Age in Treatment: 0 Fall Risk Assessment Items Have you had 2 or more falls in the last 12 monthso 0 No Have you had any fall that resulted in injury in the last 12 monthso 0 No FALLS RISK SCREEN History of falling - immediate or within 3 months 0 No Secondary diagnosis (Do you have 2 or more medical diagnoseso) 0 No Ambulatory aid None/bed rest/wheelchair/nurse 0 No Crutches/cane/walker 0 No Furniture 0 No Intravenous therapy Access/Saline/Heparin Lock 0 No Gait/Transferring Normal/ bed rest/ wheelchair 0 No Weak (short steps with or without shuffle, stooped but able to lift head while walking, may seek 0  No support from furniture) Impaired (short steps with shuffle, may have difficulty arising from chair, head down, impaired 0 No balance) Mental Status Oriented to own ability 0 Yes Electronic Signature(s) Signed: 11/27/2021 4:38:59 PM By: Blanche East RN Entered By: Blanche East on 11/25/2021 14:07:35 -------------------------------------------------------------------------------- Foot Assessment Details Patient Name: Date of Service: Deanna Levine, Deanna Levine 11/25/2021 2:00 PM Medical Record Number: [id number] Patient Account Number: [id number] Date of Birth/Sex: Treating RN: 06-23-2000 (21 y.o. Marta Lamas Primary Care Ashey Tramontana: Pricilla Holm Other Clinician: Referring Allannah Kempen: Treating Mackynzie Woolford/Extender: Durenda Age in Treatment: 0 Foot Assessment Items Site Locations + = Sensation present, - = Sensation absent, C = Callus, U = Ulcer R = Redness, W = Warmth, M = Maceration, PU = Pre-ulcerative lesion F = Fissure, S = Swelling, D = Dryness Assessment Right: Left: Other Deformity: No No Prior Foot Ulcer: No No Prior Amputation: No No Charcot Joint: No No Ambulatory Status: Ambulatory Without Help Gait: Steady Electronic Signature(s) Signed: 11/27/2021  4:38:59 PM By: Blanche East RN Entered By: Blanche East on 11/25/2021 14:07:57 -------------------------------------------------------------------------------- Nutrition Risk Screening Details Patient Name: Date of Service: Deanna Levine, Deanna Levine 11/25/2021 2:00 PM Medical Record Number: [id number] Patient Account Number: [id number] Date of Birth/Sex: Treating RN: 25-Oct-2000 (21 y.o. Iver Nestle, [place] Primary Care Loretha Ure: Pricilla Holm Other Clinician: Referring Kasra Melvin: Treating Wilkins Elpers/Extender: Durenda Age in Treatment: 0 Height (in): 64 Weight (lbs): 205 Body Mass Index (BMI): 35.2 Nutrition Risk Screening Items Score Screening NUTRITION RISK SCREEN: I have an illness or condition that made me change the kind and/or amount of food I eat 0 No I eat fewer than two meals per day 0 No I eat few fruits and vegetables, or milk products 0 No I have three or more drinks of beer, liquor or wine almost every day 0 No I have tooth or mouth problems that make it hard for me to eat 0 No I don't always have enough money to buy the food I need 0 No I eat alone most of the time 0 No I take three or more different prescribed or over-the-counter drugs a day 1 Yes Without wanting to, I have lost or gained 10 pounds in the last six months 0 No I am not always physically able to shop, cook and/or feed myself 0 No Nutrition Protocols Good Risk Protocol 0 No interventions needed Moderate Risk Protocol High Risk Proctocol Risk Level: Good Risk Score: 1 Electronic Signature(s) Signed: 11/27/2021 4:38:59 PM By: Blanche East RN Entered By: Blanche East on 11/25/2021 14:07:49

## 2021-11-27 NOTE — Progress Notes (Signed)
                Donnetta Hutching, LMFT

## 2021-11-27 NOTE — Progress Notes (Signed)
Deanna Levine, Deanna Levine ([id number]) Visit Report for 11/25/2021 Allergy List Details Patient Name: Date of Service: Deanna Levine, Deanna Levine 11/25/2021 2:00 PM Medical Record Number: [id number] Patient Account Number: [id number] Date of Birth/Sex: Treating RN: Jun 08, 2000 (21 y.o. Marta Lamas Primary Care Shaday Rayborn: Pricilla Holm Other Clinician: Referring Adamarys Shall: Treating Brylea Pita/Extender: Durenda Age in Treatment: 0 Allergies Active Allergies No Known Drug Allergies Allergy Notes Electronic Signature(s) Signed: 11/27/2021 4:38:59 PM By: Blanche East RN Entered By: Blanche East on 11/25/2021 14:04:28 -------------------------------------------------------------------------------- Arrival Information Details Patient Name: Date of Service: Deanna Levine, Deanna Levine 11/25/2021 2:00 PM Medical Record Number: [id number] Patient Account Number: [id number] Date of Birth/Sex: Treating RN: 2000/04/07 (21 y.o. Marta Lamas Primary Care Rui Wordell: Pricilla Holm Other Clinician: Referring Atlas Crossland: Treating Shayden Bobier/Extender: Durenda Age in Treatment: 0 Visit Information Patient Arrived: Ambulatory Arrival Time: 13:57 Accompanied By: mom Transfer Assistance: None History Since Last Visit All ordered tests and consults were completed: Yes Added or deleted any medications: No Any new allergies or adverse reactions: No Had a fall or experienced change in activities of daily living that may affect risk of falls: No Signs or symptoms of abuse/neglect since last visito No Hospitalized since last visit: No Implantable device outside of the clinic excluding cellular tissue based products placed in the center since last visit: No Has Dressing in Place as Prescribed: Yes Electronic Signature(s) Signed: 11/27/2021 4:38:59 PM By: Blanche East RN Entered By: Blanche East on 11/25/2021  13:58:13 -------------------------------------------------------------------------------- Clinic Level of Care Assessment Details Patient Name: Date of Service: Deanna Levine, Deanna Levine 11/25/2021 2:00 PM Medical Record Number: [id number] Patient Account Number: [id number] Date of Birth/Sex: Treating RN: [birth year] (21 y.o. America Brown Primary Care Aaralynn Shepheard: Pricilla Holm Other Clinician: Referring Tetsuo Coppola: Treating Vivan Vanderveer/Extender: Durenda Age in Treatment: 0 Clinic Level of Care Assessment Items TOOL 1 Quantity Score X- 1 0 Use when EandM and Procedure is performed on INITIAL visit ASSESSMENTS - Nursing Assessment / Reassessment X- 1 20 General Physical Exam (combine w/ comprehensive assessment (listed just below) when performed on new pt. evals) X- 1 25 Comprehensive Assessment (HX, ROS, Risk Assessments, Wounds Hx, etc.) ASSESSMENTS - Wound and Skin Assessment / Reassessment X- 1 10 Dermatologic / Skin Assessment (not related to wound area) ASSESSMENTS - Ostomy and/or Continence Assessment and Care []  - 0 Incontinence Assessment and Management []  - 0 Ostomy Care Assessment and Management (repouching, etc.) PROCESS - Coordination of Care X - Simple Patient / Family Education for ongoing care 1 15 []  - 0 Complex (extensive) Patient / Family Education for ongoing care X- 1 10 Staff obtains Programmer, systems, Records, T Results / Process Orders est X- 1 10 Staff telephones HHA, Nursing Homes / Clarify orders / etc []  - 0 Routine Transfer to another Facility (non-emergent condition) []  - 0 Routine Hospital Admission (non-emergent condition) X- 1 15 New Admissions / Biomedical engineer / Ordering NPWT Apligraf, etc. , []  - 0 Emergency Hospital Admission (emergent condition) PROCESS - Special Needs []  - 0 Pediatric / Minor Patient Management []  - 0 Isolation Patient Management []  - 0 Hearing / Language / Visual special needs []  -  0 Assessment of Community assistance (transportation, D/C planning, etc.) []  - 0 Additional assistance / Altered mentation []  - 0 Support Surface(s) Assessment (bed, cushion, seat, etc.) INTERVENTIONS - Miscellaneous []  - 0 External ear exam []  - 0 Patient Transfer (multiple staff / Civil Service fast streamer / Similar devices) []  - 0 Simple Staple / Suture removal (25 or less) []  -  0 Complex Staple / Suture removal (26 or more) []  - 0 Hypo/Hyperglycemic Management (do not check if billed separately) []  - 0 Ankle / Brachial Index (ABI) - do not check if billed separately Has the patient been seen at the hospital within the last three years: Yes Total Score: 105 Level Of Care: New/Established - Level 3 Electronic Signature(s) Signed: 11/25/2021 5:24:51 PM By: Dellie Catholic RN Signed: 11/25/2021 5:24:51 PM By: Dellie Catholic RN Entered By: Dellie Catholic on 11/25/2021 16:42:49 -------------------------------------------------------------------------------- Encounter Discharge Information Details Patient Name: Date of Service: Deanna Levine, Deanna Levine 11/25/2021 2:00 PM Medical Record Number: [id number] Patient Account Number: [id number] Date of Birth/Sex: Treating RN: [birth year] (21 y.o. America Brown Primary Care Arcadia Gorgas: Pricilla Holm Other Clinician: Referring Merryn Thaker: Treating Comer Devins/Extender: Durenda Age in Treatment: 0 Encounter Discharge Information Items Discharge Condition: Stable Ambulatory Status: Ambulatory Discharge Destination: Home Transportation: Private Auto Accompanied By: mother Schedule Follow-up Appointment: Yes Clinical Summary of Care: Patient Declined Electronic Signature(s) Signed: 11/25/2021 5:24:51 PM By: Dellie Catholic RN Entered By: Dellie Catholic on 11/25/2021 16:40:08 -------------------------------------------------------------------------------- Lower Extremity Assessment Details Patient Name: Date of  Service: Deanna Levine, Deanna Levine 11/25/2021 2:00 PM Medical Record Number: [id number] Patient Account Number: [id number] Date of Birth/Sex: Treating RN: 2000-11-01 (21 y.o. Marta Lamas Primary Care Merton Wadlow: Pricilla Holm Other Clinician: Referring Izyan Ezzell: Treating Arraya Buck/Extender: Durenda Age in Treatment: 0 Electronic Signature(s) Signed: 11/27/2021 4:38:59 PM By: Blanche East RN Entered By: Blanche East on 11/25/2021 14:08:04 -------------------------------------------------------------------------------- Multi Wound Chart Details Patient Name: Date of Service: Deanna Levine, Deanna Levine 11/25/2021 2:00 PM Medical Record Number: [id number] Patient Account Number: [id number] Date of Birth/Sex: Treating RN: 2000/04/24 (21 y.o. F) Primary Care Adarrius Graeff: Pricilla Holm Other Clinician: Referring Izzie Geers: Treating Carsin Randazzo/Extender: Durenda Age in Treatment: 0 Vital Signs Height(in): 64 Capillary Blood Glucose(mg/dl): 149 Weight(lbs): 205 Pulse(bpm): 91 Body Mass Index(BMI): 35.2 Blood Pressure(mmHg): 118/80 Temperature(F): 98.2 Respiratory Rate(breaths/min): 18 Photos: Left Chest Right, Medial Chest Right Breast Wound Location: Other Lesion Other Lesion Other Lesion Wounding Event: Hidradenitis Hidradenitis Hidradenitis Primary Etiology: Chronic sinus problems/congestion, Chronic sinus problems/congestion, Chronic sinus problems/congestion, Comorbid History: Type II Diabetes, Osteoarthritis, Type II Diabetes, Osteoarthritis, Type II Diabetes, Osteoarthritis, Seizure Disorder Seizure Disorder Seizure Disorder 10/06/2021 10/06/2021 10/06/2021 Date Acquired: 0 0 0 Weeks of Treatment: Open Open Open Wound Status: No No No Wound Recurrence: 0.7x0.6x0.1 0.8x1x0.1 0.5x0.6x0.1 Measurements L x W x D (cm) 0.33 0.628 0.236 A (cm) : rea 0.033 0.063 0.024 Volume (cm) : Full Thickness With Exposed Support  Full Thickness Without Exposed Full Thickness Without Exposed Classification: Structures Support Structures Support Structures Medium Medium Medium Exudate Amount: Serosanguineous Serosanguineous Serosanguineous Exudate Type: red, brown red, brown red, brown Exudate Color: Medium (34-66%) Medium (34-66%) Large (67-100%) Granulation Amount: Red N/A N/A Granulation Quality: Medium (34-66%) Medium (34-66%) Small (1-33%) Necrotic Amount: Fat Layer (Subcutaneous Tissue): Yes Fat Layer (Subcutaneous Tissue): Yes Fat Layer (Subcutaneous Tissue): Yes Exposed Structures: Fascia: No Fascia: No Fascia: No Tendon: No Tendon: No Tendon: No Muscle: No Muscle: No Muscle: No Joint: No Joint: No Joint: No Bone: No Bone: No Bone: No Small (1-33%) Small (1-33%) Small (1-33%) Epithelialization: Chemical Cauterization Chemical Cauterization Chemical Cauterization Procedures Performed: Treatment Notes Electronic Signature(s) Signed: 11/25/2021 3:05:05 PM By: Fredirick Maudlin MD FACS Entered By: Fredirick Maudlin on 11/25/2021 15:05:04 -------------------------------------------------------------------------------- Multi-Disciplinary Care Plan Details Patient Name: Date of Service: Deanna Levine, Deanna Levine 11/25/2021 2:00 PM Medical Record Number: [id number] Patient Account Number: [id number] Date of Birth/Sex: Treating RN: September 03, 2000 (21  y.o. Marta Lamas Primary Care Kilah Drahos: Pricilla Holm Other Clinician: Referring Mirabel Ahlgren: Treating Davinity Fanara/Extender: Durenda Age in Treatment: 0 Active Inactive Orientation to the Wound Care Program Nursing Diagnoses: Knowledge deficit related to the wound healing center program Goals: Patient/caregiver will verbalize understanding of the [facility] Program Date Initiated: 11/25/2021 Target Resolution Date: 12/02/2021 Goal Status: Active Interventions: Provide education on orientation to the wound  center Notes: Wound/Skin Impairment Nursing Diagnoses: Knowledge deficit related to ulceration/compromised skin integrity Goals: Ulcer/skin breakdown will have a volume reduction of 30% by week 4 Date Initiated: 11/25/2021 Target Resolution Date: 12/23/2021 Goal Status: Active Interventions: Assess ulceration(s) every visit Provide education on ulcer and skin care Treatment Activities: Skin care regimen initiated : 11/25/2021 Notes: Electronic Signature(s) Signed: 11/27/2021 4:38:59 PM By: Blanche East RN Entered By: Blanche East on 11/25/2021 14:23:01 -------------------------------------------------------------------------------- Pain Assessment Details Patient Name: Date of Service: Deanna Levine, Deanna Levine 11/25/2021 2:00 PM Medical Record Number: [id number] Patient Account Number: [id number] Date of Birth/Sex: Treating RN: [birth year] (21 y.o. Marta Lamas Primary Care Claude Swendsen: Pricilla Holm Other Clinician: Referring Dechelle Attaway: Treating Niomie Englert/Extender: Durenda Age in Treatment: 0 Active Problems Location of Pain Severity and Description of Pain Patient Has Paino Yes Site Locations Pain Location: Generalized Pain, Pain in Ulcers Rate the pain. Current Pain Level: 8 Character of Pain Describe the Pain: Burning Pain Management and Medication Current Pain Management: Electronic Signature(s) Signed: 11/27/2021 4:38:59 PM By: Blanche East RN Entered By: Blanche East on 11/25/2021 14:22:14 -------------------------------------------------------------------------------- Patient/Caregiver Education Details Patient Name: Date of Service: SHEKIRA, DRUMMER 9/27/2023andnbsp2:00 PM Medical Record Number: [id number] Patient Account Number: [id number] Date of Birth/Gender: Treating RN: February 16, 2001 (21 y.o. Marta Lamas Primary Care Physician: Pricilla Holm Other Clinician: Referring Physician: Treating Physician/Extender:  Durenda Age in Treatment: 0 Education Assessment Education Provided To: Patient Education Topics Provided Welcome T [facility]: o Methods: Explain/Verbal Responses: Reinforcements needed, State content correctly Wound/Skin Impairment: Methods: Explain/Verbal Responses: Reinforcements needed, State content correctly Electronic Signature(s) Signed: 11/27/2021 4:38:59 PM By: Blanche East RN Entered By: Blanche East on 11/25/2021 14:23:14 -------------------------------------------------------------------------------- Wound Assessment Details Patient Name: Date of Service: Deanna Levine, Deanna Levine 11/25/2021 2:00 PM Medical Record Number: [id number] Patient Account Number: [id number] Date of Birth/Sex: Treating RN: September 28, 2000 (21 y.o. Iver Nestle, Jamie Primary Care Chasity Outten: Pricilla Holm Other Clinician: Referring Alysse Rathe: Treating Malarie Tappen/Extender: Durenda Age in Treatment: 0 Wound Status Wound Number: 7 Primary Hidradenitis Etiology: Wound Location: Left Chest Wound Open Wounding Event: Other Lesion Status: Date Acquired: 10/06/2021 Comorbid Chronic sinus problems/congestion, Type II Diabetes, Weeks Of Treatment: 0 History: Osteoarthritis, Seizure Disorder Clustered Wound: No Photos Wound Measurements Length: (cm) 0.7 Width: (cm) 0.6 Depth: (cm) 0.1 Area: (cm) 0.33 Volume: (cm) 0.033 % Reduction in Area: % Reduction in Volume: Epithelialization: Small (1-33%) Tunneling: No Wound Description Classification: Full Thickness With Exposed Support Structures Exudate Amount: Medium Exudate Type: Serosanguineous Exudate Color: red, brown Foul Odor After Cleansing: No Slough/Fibrino Yes Wound Bed Granulation Amount: Medium (34-66%) Exposed Structure Granulation Quality: Red Fascia Exposed: No Necrotic Amount: Medium (34-66%) Fat Layer (Subcutaneous Tissue) Exposed: Yes Necrotic Quality:  Adherent Slough Tendon Exposed: No Muscle Exposed: No Joint Exposed: No Bone Exposed: No Treatment Notes Wound #7 (Chest) Wound Laterality: Left Cleanser Peri-Wound Care Topical Primary Dressing KerraCel Ag Gelling Fiber Dressing, 2x2 in (silver alginate) Discharge Instruction: Apply silver alginate to wound bed as instructed Secondary Dressing Bordered Gauze, 2x2 in Discharge Instruction: Apply over primary dressing  as directed. Secured With Compression Wrap Compression Stockings Environmental education officer) Signed: 11/27/2021 4:38:59 PM By: Blanche East RN Entered By: Blanche East on 11/25/2021 14:20:38 -------------------------------------------------------------------------------- Wound Assessment Details Patient Name: Date of Service: Deanna Levine, Deanna Levine 11/25/2021 2:00 PM Medical Record Number: [id number] Patient Account Number: [id number] Date of Birth/Sex: Treating RN: 02/16/2001 (21 y.o. Iver Nestle, Jamie Primary Care Darryll Raju: Pricilla Holm Other Clinician: Referring Thomasenia Dowse: Treating Chasitie Passey/Extender: Durenda Age in Treatment: 0 Wound Status Wound Number: 8 Primary Hidradenitis Etiology: Wound Location: Right, Medial Chest Wound Open Wounding Event: Other Lesion Status: Date Acquired: 10/06/2021 Comorbid Chronic sinus problems/congestion, Type II Diabetes, Weeks Of Treatment: 0 History: Osteoarthritis, Seizure Disorder Clustered Wound: No Photos Wound Measurements Length: (cm) 0.8 Width: (cm) 1 Depth: (cm) 0.1 Area: (cm) 0.628 Volume: (cm) 0.063 % Reduction in Area: % Reduction in Volume: Epithelialization: Small (1-33%) Tunneling: No Undermining: No Wound Description Classification: Full Thickness Without Exposed Support Structures Exudate Amount: Medium Exudate Type: Serosanguineous Exudate Color: red, brown Foul Odor After Cleansing: No Slough/Fibrino Yes Wound Bed Granulation Amount: Medium  (34-66%) Exposed Structure Necrotic Amount: Medium (34-66%) Fascia Exposed: No Necrotic Quality: Adherent Slough Fat Layer (Subcutaneous Tissue) Exposed: Yes Tendon Exposed: No Muscle Exposed: No Joint Exposed: No Bone Exposed: No Treatment Notes Wound #8 (Chest) Wound Laterality: Right, Medial Cleanser Peri-Wound Care Topical Primary Dressing KerraCel Ag Gelling Fiber Dressing, 2x2 in (silver alginate) Discharge Instruction: Apply silver alginate to wound bed as instructed Secondary Dressing Bordered Gauze, 2x2 in Discharge Instruction: Apply over primary dressing as directed. Secured With Compression Wrap Compression Stockings Environmental education officer) Signed: 11/27/2021 4:38:59 PM By: Blanche East RN Entered By: Blanche East on 11/25/2021 14:21:03 -------------------------------------------------------------------------------- Wound Assessment Details Patient Name: Date of Service: Deanna Levine, Deanna Levine 11/25/2021 2:00 PM Medical Record Number: [id number] Patient Account Number: [id number] Date of Birth/Sex: Treating RN: [birth year] (21 y.o. Iver Nestle, Jamie Primary Care Jarelly Rinck: Pricilla Holm Other Clinician: Referring Macauley Mossberg: Treating Brody Bonneau/Extender: Durenda Age in Treatment: 0 Wound Status Wound Number: 9 Primary Hidradenitis Etiology: Wound Location: Right Breast Wound Open Wounding Event: Other Lesion Status: Date Acquired: 10/06/2021 Comorbid Chronic sinus problems/congestion, Type II Diabetes, Weeks Of Treatment: 0 History: Osteoarthritis, Seizure Disorder Clustered Wound: No Photos Wound Measurements Length: (cm) 0.5 Width: (cm) 0.6 Depth: (cm) 0.1 Area: (cm) 0.236 Volume: (cm) 0.024 % Reduction in Area: % Reduction in Volume: Epithelialization: Small (1-33%) Tunneling: No Undermining: No Wound Description Classification: Full Thickness Without Exposed Support Structures Exudate Amount:  Medium Exudate Type: Serosanguineous Exudate Color: red, brown Foul Odor After Cleansing: No Slough/Fibrino Yes Wound Bed Granulation Amount: Large (67-100%) Exposed Structure Necrotic Amount: Small (1-33%) Fascia Exposed: No Necrotic Quality: Adherent Slough Fat Layer (Subcutaneous Tissue) Exposed: Yes Tendon Exposed: No Muscle Exposed: No Joint Exposed: No Bone Exposed: No Treatment Notes Wound #9 (Breast) Wound Laterality: Right Cleanser Peri-Wound Care Topical Primary Dressing KerraCel Ag Gelling Fiber Dressing, 2x2 in (silver alginate) Discharge Instruction: Apply silver alginate to wound bed as instructed Secondary Dressing Bordered Gauze, 2x2 in Discharge Instruction: Apply over primary dressing as directed. Secured With Compression Wrap Compression Stockings Environmental education officer) Signed: 11/27/2021 4:38:59 PM By: Blanche East RN Entered By: Blanche East on 11/25/2021 14:21:41 -------------------------------------------------------------------------------- Vitals Details Patient Name: Date of Service: MAKYIA, ERXLEBEN 11/25/2021 2:00 PM Medical Record Number: [id number] Patient Account Number: [id number] Date of Birth/Sex: Treating RN: April 03, 2000 (21 y.o. Marta Lamas Primary Care Ellowyn Rieves: Pricilla Holm Other Clinician: Referring Clayburn Weekly: Treating Phares Zaccone/Extender: Durenda Age in  Treatment: 0 Vital Signs Time Taken: 13:58 Temperature (F): 98.2 Height (in): 64 Pulse (bpm): 91 Source: Stated Respiratory Rate (breaths/min): 18 Weight (lbs): 205 Blood Pressure (mmHg): 118/80 Source: Stated Capillary Blood Glucose (mg/dl): 149 Body Mass Index (BMI): 35.2 Reference Range: 80 - 120 mg / dl Electronic Signature(s) Signed: 11/27/2021 4:38:59 PM By: Blanche East RN Entered By: Blanche East on 11/25/2021 14:03:49

## 2021-11-27 NOTE — Progress Notes (Signed)
RADA, ZEGERS ([id number]) Visit Report for 11/25/2021 Chief Complaint Document Details Patient Name: Date of Service: Deanna Levine, Deanna Levine 11/25/2021 2:00 PM Medical Record Number: [id number] Patient Account Number: [id number] Date of Birth/Sex: Treating RN: 2000/12/26 (21 y.o. F) Primary Care Provider: Pricilla Holm Other Clinician: Referring Provider: Treating Provider/Extender: Durenda Age in Treatment: 0 Information Obtained from: Patient Chief Complaint Hx of hiadrenitis Electronic Signature(s) Signed: 11/25/2021 3:05:11 PM By: Fredirick Maudlin MD FACS Entered By: Fredirick Maudlin on 11/25/2021 15:05:11 -------------------------------------------------------------------------------- HPI Details Patient Name: Date of Service: Deanna Levine, Deanna Levine 11/25/2021 2:00 PM Medical Record Number: [id number] Patient Account Number: [id number] Date of Birth/Sex: Treating RN: [birth year] (21 y.o. F) Primary Care Provider: Pricilla Holm Other Clinician: Referring Provider: Treating Provider/Extender: Durenda Age in Treatment: 0 History of Present Illness HPI Description: 06/04/2020 patient presents today for initial evaluation here in clinic concerning issues she has been having with her under her arms, under the breast, in the suprapubic region, and on her upper back as well with issues which are consistent with hidradenitis though I think it is more on the milder side. Her and her mother have been on top of this trying to keep things under control. Her mother also has issues with hidradenitis though not severely either. This does seem to potentially be something that runs in the family. Nonetheless fortunately there does not appear to be any evidence of significant infection at this point. The wound which is actually under the right breast location actually does not appear to be infected right now which is great news. With that  being said I do see evidence currently of scarring in multiple locations which show that the patient has obviously had issues in the past that several other locations as well. All in the same regions. Nonetheless she does not have a tremendous amount of pain noted at this point which is good news. She does have some mild mental disability according to her mother she does have mild dysphagia as well which requires that she has to either take liquid or crushed antibiotics when she does get those. Her most recent hemoglobin A1c was 6.6. Currently this episode has been going on for about 6 months ago again 3 years as the total length of time she has been dealing with this. She has never seen a hidradenitis clinic. 06/18/20 upon evaluation today patient appears to be doing in my opinion a little better compared to her previous evaluation. There does not appear to be any signs of infection which is great news and overall very pleased with where things stand today. She has not heard from the hidradenitis clinic at [facility] yet although we made that referral last time she was here 2 weeks ago. 5//22 upon evaluation today patient appears to be doing well with regard to her wounds. I do think the Hydrofera Blue has been doing well for her. She has a new area open up on the left/medial chest just inside of her breast. With that being said this does not appear to be too significant at this point which is great news. With that being said she unfortunately is continuing to have new areas show up but again these are minimal and seem to be under fairly good control which is great as well. I do not see any signs of active infection at this point which is great news. She did get a call from [facility] from the hidradenitis clinic. With that being said she is on a wait  list it sounds like at this point. 07/30/2020 upon evaluation today patient appears to be doing well with regard to her wounds for the most part. There are some areas  that have actually healed quite nicely. With that being said she has some regions that do not seem to be healing nearly as effectively at this point. Overall I am pleased with what we are seeing for the most part but at the same time I do feel like that the new areas that pop up or not good she has an area underneath her underarm on the left as well. She has been on doxycycline. She still on the wait list at [facility] she has heard nothing from that according to her mother. In regard to the dermatologist that they have through [facility] that she will be seeing in November they are on a wait list there in case anybody cancels but have not heard anything from that. 09/03/2020 upon evaluation today patient appears to be doing well with regard to her the hidradenitis areas underneath the breast location. She has been tolerating the dressing changes without complication with been using Hydrofera Blue which is doing a great job. She does not have any open areas under the underarms either at this point. Everything seems to be doing quite well which is great. 10/01/2020 upon evaluation today patient appears to be doing well with regard to her wounds. Again she has mainly been seen for issues with hidradenitis and to be honest everything appears to be pretty much filled out at this point. There does not appear to be any evidence of active infection which is great news and overall I am extremely pleased with where things stand today. No fevers, chills, nausea, vomiting, or diarrhea. Readmission 03/09/2021; patient presents because she states that a few weeks ago she had reopening of areas underneath her breast and right axilla related to her hidradenitis. She follows with dermatology for this issue. She states these areas have healed and she has no open wounds today. She states she uses Hibiclens and clindamycin solution to help maintain the area clean. She was supposed to be on Keflex suspension however because she had  surgery this was held and when she went back to retake it it had expired. She has not followed up with her dermatologist for this. She has not heard back from the hidradenitis clinic at [facility] that we referred her to. 1/18; patient was readmitted to the clinic by Dr. Heber [place] last week at which time she had no open areas. She came back because some areas on the undersurface of both breasts have opened. She has fairly clear hidradenitis for which she has seen Dr. Leonie Green at [facility] dermatology. She has been using Hibiclens on the areas. She has an appointment with Dr. Leonie Green in March 2/2; patient has no open wounds on the undersurface of either breast. May be an impending small open area in the left axilla but I did not see anything else worth looking at. I have left her with a prescription at her pharmacy for topical clindamycin to use before she gets into see Dr. Leonie Green at [facility] dermatology in March. I do not think this is severe enough for continue with systemic therapy at present [place] 11/25/2021 The patient returns to clinic today with 3 new open lesions. One between her breasts and to under the right breast. She and her mother reports that they were bigger before and it burst open, rather than contacting dermatology, they contacted the wound care  center for further evaluation and management. She did see Dr. Leonie Green at [facility] and was prescribed topical clindamycin and Hibiclens. Apparently the patient is unable to swallow oral antibiotics so she is not on any suppressive therapy. Humira has apparently been entertained but not implemented. According to electronic medical record, the patient is scheduled to follow-up with dermatology next month. On exam, there are 3 tiny wounds on the patient's chest. There is no abscess or fluctuance, nor any drainage. They all actually look a little bit hyper granulated. No concern for infection. Electronic Signature(s) Signed: 11/25/2021  3:07:29 PM By: Fredirick Maudlin MD FACS Entered By: Fredirick Maudlin on 11/25/2021 15:07:29 -------------------------------------------------------------------------------- Chemical Cauterization Details Patient Name: Date of Service: Deanna Levine, Deanna Levine 11/25/2021 2:00 PM Medical Record Number: [id number] Patient Account Number: [id number] Date of Birth/Sex: Treating RN: 05/06/2000 (21 y.o. America Brown Primary Care Provider: Pricilla Holm Other Clinician: Referring Provider: Treating Provider/Extender: Durenda Age in Treatment: 0 Procedure Performed for: Wound #7 Left Chest Performed By: Physician Fredirick Maudlin, MD Post Procedure Diagnosis Same as Pre-procedure Electronic Signature(s) Signed: 11/25/2021 3:45:58 PM By: Fredirick Maudlin MD FACS Signed: 11/25/2021 5:24:51 PM By: Dellie Catholic RN Entered By: Dellie Catholic on 11/25/2021 14:39:37 -------------------------------------------------------------------------------- Chemical Cauterization Details Patient Name: Date of Service: Deanna Levine, Deanna Levine 11/25/2021 2:00 PM Medical Record Number: [id number] Patient Account Number: [id number] Date of Birth/Sex: Treating RN: 05-22-2000 (21 y.o. America Brown Primary Care Provider: Pricilla Holm Other Clinician: Referring Provider: Treating Provider/Extender: Durenda Age in Treatment: 0 Procedure Performed for: Wound #8 Right,Medial Chest Performed By: Physician Fredirick Maudlin, MD Post Procedure Diagnosis Same as Pre-procedure Electronic Signature(s) Signed: 11/25/2021 3:45:58 PM By: Fredirick Maudlin MD FACS Signed: 11/25/2021 5:24:51 PM By: Dellie Catholic RN Entered By: Dellie Catholic on 11/25/2021 14:39:48 -------------------------------------------------------------------------------- Chemical Cauterization Details Patient Name: Date of Service: Deanna Levine, Deanna Levine 11/25/2021 2:00 PM Medical  Record Number: [id number] Patient Account Number: [id number] Date of Birth/Sex: Treating RN: 11-23-2000 (21 y.o. America Brown Primary Care Provider: Pricilla Holm Other Clinician: Referring Provider: Treating Provider/Extender: Durenda Age in Treatment: 0 Procedure Performed for: Wound #9 Right Breast Performed By: Physician Fredirick Maudlin, MD Post Procedure Diagnosis Same as Pre-procedure Electronic Signature(s) Signed: 11/25/2021 3:45:58 PM By: Fredirick Maudlin MD FACS Signed: 11/25/2021 5:24:51 PM By: Dellie Catholic RN Entered By: Dellie Catholic on 11/25/2021 14:40:02 -------------------------------------------------------------------------------- Physical Exam Details Patient Name: Date of Service: Deanna Levine, Deanna Levine 11/25/2021 2:00 PM Medical Record Number: [id number] Patient Account Number: [id number] Date of Birth/Sex: Treating RN: 03/08/2000 (21 y.o. F) Primary Care Provider: Pricilla Holm Other Clinician: Referring Provider: Treating Provider/Extender: Durenda Age in Treatment: 0 Constitutional . . . . No acute distress. Respiratory Normal work of breathing on room air.. Notes 11/25/2021: On exam, there are 3 tiny wounds on the patient's chest. There is no abscess or fluctuance, nor any drainage. They all actually look a little bit hyper granulated. No concern for infection. Electronic Signature(s) Signed: 11/25/2021 3:09:43 PM By: Fredirick Maudlin MD FACS Entered By: Fredirick Maudlin on 11/25/2021 15:09:43 -------------------------------------------------------------------------------- Physician Orders Details Patient Name: Date of Service: Deanna Levine, Deanna Levine 11/25/2021 2:00 PM Medical Record Number: [id number] Patient Account Number: [id number] Date of Birth/Sex: Treating RN: Apr 18, 2000 (21 y.o. Marta Lamas Primary Care Provider: Pricilla Holm Other Clinician: Referring  Provider: Treating Provider/Extender: Durenda Age in Treatment: 0 Verbal / Phone Orders: No Diagnosis Coding ICD-10 Coding Code Description L73.2 Hidradenitis suppurativa E11.622 Type 2 diabetes  mellitus with other skin ulcer Q87.89 Other specified congenital malformation syndromes, not elsewhere classified Follow-up Appointments ppointment in 2 weeks. - Dr Celine Ahr Room 3 Wednesday October 11th Return A Discharge From [facility] Discharge from [place] to use Clindamycin ointment as needed on flare ups, continue to follow Dermatology. Anesthetic Wound #7 Left Chest (In clinic) Topical Lidocaine 4% applied to wound bed - in clinic, prior to debridement Wound #8 Right,Medial Chest (In clinic) Topical Lidocaine 4% applied to wound bed - in clinic, prior to debridement Wound #9 Right Breast (In clinic) Topical Lidocaine 4% applied to wound bed - in clinic, prior to debridement Wound Treatment Wound #7 - Chest Wound Laterality: Left Prim Dressing: KerraCel Ag Gelling Fiber Dressing, 2x2 in (silver alginate) 1 x Per Day/30 Days ary Discharge Instructions: Apply silver alginate to wound bed as instructed Secondary Dressing: Bordered Gauze, 2x2 in 1 x Per Day/30 Days Discharge Instructions: Apply over primary dressing as directed. Wound #8 - Chest Wound Laterality: Right, Medial Prim Dressing: KerraCel Ag Gelling Fiber Dressing, 2x2 in (silver alginate) 1 x Per Day/30 Days ary Discharge Instructions: Apply silver alginate to wound bed as instructed Secondary Dressing: Bordered Gauze, 2x2 in 1 x Per Day/30 Days Discharge Instructions: Apply over primary dressing as directed. Wound #9 - Breast Wound Laterality: Right Prim Dressing: KerraCel Ag Gelling Fiber Dressing, 2x2 in (silver alginate) 1 x Per Day/30 Days ary Discharge Instructions: Apply silver alginate to wound bed as instructed Secondary Dressing: Bordered Gauze, 2x2 in 1 x  Per Day/30 Days Discharge Instructions: Apply over primary dressing as directed. Electronic Signature(s) Signed: 11/25/2021 3:45:58 PM By: Fredirick Maudlin MD FACS Entered By: Fredirick Maudlin on 11/25/2021 15:09:57 -------------------------------------------------------------------------------- Problem List Details Patient Name: Date of Service: Deanna Levine, Deanna Levine 11/25/2021 2:00 PM Medical Record Number: [id number] Patient Account Number: [id number] Date of Birth/Sex: Treating RN: 2001-01-01 (21 y.o. F) Primary Care Provider: Pricilla Holm Other Clinician: Referring Provider: Treating Provider/Extender: Durenda Age in Treatment: 0 Active Problems ICD-10 Encounter Code Description Active Date MDM Diagnosis L73.2 Hidradenitis suppurativa 11/25/2021 No Yes E11.622 Type 2 diabetes mellitus with other skin ulcer 11/25/2021 No Yes Q87.89 Other specified congenital malformation syndromes, not elsewhere classified 11/25/2021 No Yes Inactive Problems Resolved Problems Electronic Signature(s) Signed: 11/25/2021 3:04:05 PM By: Fredirick Maudlin MD FACS Entered By: Fredirick Maudlin on 11/25/2021 15:04:05 -------------------------------------------------------------------------------- Progress Note Details Patient Name: Date of Service: Deanna Levine, Deanna Levine 11/25/2021 2:00 PM Medical Record Number: [id number] Patient Account Number: [id number] Date of Birth/Sex: Treating RN: 03/18/2000 (21 y.o. F) Primary Care Provider: Pricilla Holm Other Clinician: Referring Provider: Treating Provider/Extender: Durenda Age in Treatment: 0 Subjective Chief Complaint Information obtained from Patient Hx of hiadrenitis History of Present Illness (HPI) 06/04/2020 patient presents today for initial evaluation here in clinic concerning issues she has been having with her under her arms, under the breast, in the suprapubic region, and on her  upper back as well with issues which are consistent with hidradenitis though I think it is more on the milder side. Her and her mother have been on top of this trying to keep things under control. Her mother also has issues with hidradenitis though not severely either. This does seem to potentially be something that runs in the family. Nonetheless fortunately there does not appear to be any evidence of significant infection at this point. The wound which is actually under the right breast location actually does not appear to be infected right now which  is great news. With that being said I do see evidence currently of scarring in multiple locations which show that the patient has obviously had issues in the past that several other locations as well. All in the same regions. Nonetheless she does not have a tremendous amount of pain noted at this point which is good news. She does have some mild mental disability according to her mother she does have mild dysphagia as well which requires that she has to either take liquid or crushed antibiotics when she does get those. Her most recent hemoglobin A1c was 6.6. Currently this episode has been going on for about 6 months ago again 3 years as the total length of time she has been dealing with this. She has never seen a hidradenitis clinic. 06/18/20 upon evaluation today patient appears to be doing in my opinion a little better compared to her previous evaluation. There does not appear to be any signs of infection which is great news and overall very pleased with where things stand today. She has not heard from the hidradenitis clinic at [facility] yet although we made that referral last time she was here 2 weeks ago. 5//22 upon evaluation today patient appears to be doing well with regard to her wounds. I do think the Hydrofera Blue has been doing well for her. She has a new area open up on the left/medial chest just inside of her breast. With that being said this  does not appear to be too significant at this point which is great news. With that being said she unfortunately is continuing to have new areas show up but again these are minimal and seem to be under fairly good control which is great as well. I do not see any signs of active infection at this point which is great news. She did get a call from [facility] from the hidradenitis clinic. With that being said she is on a wait list it sounds like at this point. 07/30/2020 upon evaluation today patient appears to be doing well with regard to her wounds for the most part. There are some areas that have actually healed quite nicely. With that being said she has some regions that do not seem to be healing nearly as effectively at this point. Overall I am pleased with what we are seeing for the most part but at the same time I do feel like that the new areas that pop up or not good she has an area underneath her underarm on the left as well. She has been on doxycycline. She still on the wait list at [facility] she has heard nothing from that according to her mother. In regard to the dermatologist that they have through [facility] that she will be seeing in November they are on a wait list there in case anybody cancels but have not heard anything from that. 09/03/2020 upon evaluation today patient appears to be doing well with regard to her the hidradenitis areas underneath the breast location. She has been tolerating the dressing changes without complication with been using Hydrofera Blue which is doing a great job. She does not have any open areas under the underarms either at this point. Everything seems to be doing quite well which is great. 10/01/2020 upon evaluation today patient appears to be doing well with regard to her wounds. Again she has mainly been seen for issues with hidradenitis and to be honest everything appears to be pretty much filled out at this point. There does not appear  to be any evidence of active infection  which is great news and overall I am extremely pleased with where things stand today. No fevers, chills, nausea, vomiting, or diarrhea. Readmission 03/09/2021; patient presents because she states that a few weeks ago she had reopening of areas underneath her breast and right axilla related to her hidradenitis. She follows with dermatology for this issue. She states these areas have healed and she has no open wounds today. She states she uses Hibiclens and clindamycin solution to help maintain the area clean. She was supposed to be on Keflex suspension however because she had surgery this was held and when she went back to retake it it had expired. She has not followed up with her dermatologist for this. She has not heard back from the hidradenitis clinic at [facility] that we referred her to. 1/18; patient was readmitted to the clinic by Dr. Heber [place] last week at which time she had no open areas. She came back because some areas on the undersurface of both breasts have opened. She has fairly clear hidradenitis for which she has seen Dr. Leonie Green at [facility] dermatology. She has been using Hibiclens on the areas. She has an appointment with Dr. Leonie Green in March 2/2; patient has no open wounds on the undersurface of either breast. May be an impending small open area in the left axilla but I did not see anything else worth looking at. I have left her with a prescription at her pharmacy for topical clindamycin to use before she gets into see Dr. Leonie Green at [facility] dermatology in March. I do not think this is severe enough for continue with systemic therapy at present [place] 11/25/2021 The patient returns to clinic today with 3 new open lesions. One between her breasts and to under the right breast. She and her mother reports that they were bigger before and it burst open, rather than contacting dermatology, they contacted the wound care center for further evaluation and management. She did see Dr.  Leonie Green at [facility] and was prescribed topical clindamycin and Hibiclens. Apparently the patient is unable to swallow oral antibiotics so she is not on any suppressive therapy. Humira has apparently been entertained but not implemented. According to electronic medical record, the patient is scheduled to follow-up with dermatology next month. On exam, there are 3 tiny wounds on the patient's chest. There is no abscess or fluctuance, nor any drainage. They all actually look a little bit hyper granulated. No concern for infection. Patient History Information obtained from Patient. Allergies No Known Drug Allergies Family History Diabetes - Maternal Grandparents,Paternal Grandparents,Mother,Father, Heart Disease - Mother, Hypertension - Paternal Aeronautical engineer, Stroke - Paternal Grandparents, No family history of Cancer, Hereditary Spherocytosis, Kidney Disease, Lung Disease, Seizures, Thyroid Problems, Tuberculosis. Social History Never smoker, Marital Status - Single, Alcohol Use - Never, Drug Use - No History, Caffeine Use - Never. Medical History Eyes Denies history of Cataracts, Glaucoma, Optic Neuritis Ear/Nose/Mouth/Throat Patient has history of Chronic sinus problems/congestion Denies history of Middle ear problems Hematologic/Lymphatic Denies history of Anemia, Hemophilia, Human Immunodeficiency Virus, Lymphedema, Sickle Cell Disease Respiratory Denies history of Aspiration, Asthma, Chronic Obstructive Pulmonary Disease (COPD), Pneumothorax, Sleep Apnea, Tuberculosis Cardiovascular Denies history of Angina, Arrhythmia, Congestive Heart Failure, Coronary Artery Disease, Deep Vein Thrombosis, Hypertension, Hypotension, Myocardial Infarction, Peripheral Arterial Disease, Peripheral Venous Disease, Phlebitis, Vasculitis Gastrointestinal Denies history of Cirrhosis , Colitis, Crohnoos, Hepatitis A, Hepatitis B, Hepatitis C Endocrine Patient has history  of Type II Diabetes Denies history of Type I  Diabetes Genitourinary Denies history of End Stage Renal Disease Immunological Denies history of Lupus Erythematosus, Raynaudoos, Scleroderma Integumentary (Skin) Denies history of History of Burn Musculoskeletal Patient has history of Osteoarthritis - right foot Denies history of Gout, Rheumatoid Arthritis, Osteomyelitis Neurologic Patient has history of Seizure Disorder Denies history of Dementia, Neuropathy, Quadriplegia, Paraplegia Oncologic Denies history of Received Chemotherapy, Received Radiation Psychiatric Denies history of Anorexia/bulimia, Confinement Anxiety Hospitalization/Surgery History - 04/25/2020 bilateral achills tendon release. - 2005 tonsil and adenoids removed. 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 Vitals Time Taken: 1:58 PM, Height: 64 in, Source: Stated, Weight: 205 lbs, Source: Stated, BMI: 35.2, Temperature: 98.2 F, Pulse: 91 bpm, Respiratory Rate: 18 breaths/min, Blood Pressure: 118/80 mmHg, Capillary Blood Glucose: 149 mg/dl. Respiratory Normal work of breathing on room air.. General Notes: 11/25/2021: On exam, there are 3 tiny wounds on the patient's chest. There is no abscess or fluctuance, nor any drainage. They all actually look a little bit hyper granulated. No concern for infection. Integumentary (Hair, Skin) Wound #7 status is Open. Original cause of wound was Other Lesion. The date acquired was: 10/06/2021. The wound is located on the Left Chest. The wound measures 0.7cm length x 0.6cm width x 0.1cm depth; 0.33cm^2 area and 0.033cm^3 volume. There is Fat Layer (Subcutaneous Tissue) exposed. There is no tunneling noted. There is a medium amount of serosanguineous drainage noted. There is medium  (34-66%) red granulation within the wound bed. There is a medium (34-66%) amount of necrotic tissue within the wound bed including Adherent Slough. Wound #8 status is Open. Original cause of wound was Other Lesion. The date acquired was: 10/06/2021. The wound is located on the Right,Medial Chest. The wound measures 0.8cm length x 1cm width x 0.1cm depth; 0.628cm^2 area and 0.063cm^3 volume. There is Fat Layer (Subcutaneous Tissue) exposed. There is no tunneling or undermining noted. There is a medium amount of serosanguineous drainage noted. There is medium (34-66%) granulation within the wound bed. There is a medium (34-66%) amount of necrotic tissue within the wound bed including Adherent Slough. Wound #9 status is Open. Original cause of wound was Other Lesion. The date acquired was: 10/06/2021. The wound is located on the Right Breast. The wound measures 0.5cm length x 0.6cm width x 0.1cm depth; 0.236cm^2 area and 0.024cm^3 volume. There is Fat Layer (Subcutaneous Tissue) exposed. There is no tunneling or undermining noted. There is a medium amount of serosanguineous drainage noted. There is large (67-100%) granulation within the wound bed. There is a small (1-33%) amount of necrotic tissue within the wound bed including Adherent Slough. Assessment Active Problems ICD-10 Hidradenitis suppurativa Type 2 diabetes mellitus with other skin ulcer Other specified congenital malformation syndromes, not elsewhere classified Procedures Wound #7 Pre-procedure diagnosis of Wound #7 is a Hidradenitis located on the Left Chest . An Chemical Cauterization procedure was performed by Fredirick Maudlin, MD. Post procedure Diagnosis Wound #7: Same as Pre-Procedure Wound #8 Pre-procedure diagnosis of Wound #8 is a Hidradenitis located on the Right,Medial Chest . An Chemical Cauterization procedure was performed by Fredirick Maudlin, MD. Post procedure Diagnosis Wound #8: Same as Pre-Procedure Wound  #9 Pre-procedure diagnosis of Wound #9 is a Hidradenitis located on the Right Breast . An Chemical Cauterization procedure was performed by Fredirick Maudlin, MD. Post procedure Diagnosis Wound #9: Same as Pre-Procedure Plan Follow-up Appointments: Return Appointment in 2 weeks. - Dr Celine Ahr Room 3  Wednesday October 11th Discharge From [facility] Services: Discharge from [place] to use Clindamycin ointment as needed on flare ups, continue to follow Dermatology. Anesthetic: Wound #7 Left Chest: (In clinic) Topical Lidocaine 4% applied to wound bed - in clinic, prior to debridement Wound #8 Right,Medial Chest: (In clinic) Topical Lidocaine 4% applied to wound bed - in clinic, prior to debridement Wound #9 Right Breast: (In clinic) Topical Lidocaine 4% applied to wound bed - in clinic, prior to debridement WOUND #7: - Chest Wound Laterality: Left Prim Dressing: KerraCel Ag Gelling Fiber Dressing, 2x2 in (silver alginate) 1 x Per Day/30 Days ary Discharge Instructions: Apply silver alginate to wound bed as instructed Secondary Dressing: Bordered Gauze, 2x2 in 1 x Per Day/30 Days Discharge Instructions: Apply over primary dressing as directed. WOUND #8: - Chest Wound Laterality: Right, Medial Prim Dressing: KerraCel Ag Gelling Fiber Dressing, 2x2 in (silver alginate) 1 x Per Day/30 Days ary Discharge Instructions: Apply silver alginate to wound bed as instructed Secondary Dressing: Bordered Gauze, 2x2 in 1 x Per Day/30 Days Discharge Instructions: Apply over primary dressing as directed. WOUND #9: - Breast Wound Laterality: Right Prim Dressing: KerraCel Ag Gelling Fiber Dressing, 2x2 in (silver alginate) 1 x Per Day/30 Days ary Discharge Instructions: Apply silver alginate to wound bed as instructed Secondary Dressing: Bordered Gauze, 2x2 in 1 x Per Day/30 Days Discharge Instructions: Apply over primary dressing as directed. 11/25/2021: On exam, there are 3 tiny wounds on the  patient's chest. There is no abscess or fluctuance, nor any drainage. They all actually look a little bit hyper granulated. No concern for infection. I used silver nitrate to chemically cauterize each of the wounds. They should continue the regimen of Hibiclens washes with topical clindamycin. We will also apply some silver alginate and foam border dressings to each of the wounds. She will follow-up in 2 weeks. I also encouraged them to contact her dermatologist for further guidance. Electronic Signature(s) Signed: 11/25/2021 3:10:46 PM By: Fredirick Maudlin MD FACS Entered By: Fredirick Maudlin on 11/25/2021 15:10:46 -------------------------------------------------------------------------------- HxROS Details Patient Name: Date of Service: Deanna Levine, Deanna Levine 11/25/2021 2:00 PM Medical Record Number: [id number] Patient Account Number: [id number] Date of Birth/Sex: Treating RN: Jun 09, 2000 (21 y.o. 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 Eyes Medical History: Negative for: Cataracts; Glaucoma; Optic Neuritis Ear/Nose/Mouth/Throat Medical History: Positive for: Chronic sinus problems/congestion Negative for: Middle ear problems Hematologic/Lymphatic Medical History: Negative for: Anemia; Hemophilia; Human Immunodeficiency Virus; Lymphedema; Sickle Cell Disease Respiratory Medical History: Negative for: Aspiration; Asthma; Chronic Obstructive Pulmonary Disease (COPD); Pneumothorax; Sleep Apnea; Tuberculosis Cardiovascular Medical History: Negative for: Angina; Arrhythmia; Congestive Heart Failure; Coronary Artery Disease; Deep Vein  Thrombosis; Hypertension; Hypotension; Myocardial Infarction; Peripheral Arterial Disease; Peripheral Venous Disease; Phlebitis; Vasculitis Gastrointestinal Medical History: Negative for: Cirrhosis ; Colitis; Crohns; Hepatitis A; Hepatitis B; Hepatitis C Endocrine Medical History: Positive for: Type II Diabetes Negative for: Type I Diabetes Time with diabetes: 3 years ago Treated with: Insulin Blood sugar tested every day: Yes Tested : continuous Genitourinary Medical History: Negative for: End Stage Renal Disease Immunological Medical History: Negative for: Lupus Erythematosus; Raynauds; Scleroderma Integumentary (Skin) Medical History: Negative for: History of Burn Musculoskeletal Medical History: Positive for: Osteoarthritis - right foot Negative for: Gout; Rheumatoid Arthritis; Osteomyelitis Neurologic Medical History:  Positive for: Seizure Disorder Negative for: Dementia; Neuropathy; Quadriplegia; Paraplegia Past Medical History Notes: White-Sutton Syndrome Oncologic Medical History: Negative for: Received Chemotherapy; Received Radiation Psychiatric Medical History: Negative for: Anorexia/bulimia; Confinement Anxiety HBO Extended History Items Ear/Nose/Mouth/Throat: Chronic sinus problems/congestion Immunizations Pneumococcal Vaccine: Received Pneumococcal Vaccination: Yes Received Pneumococcal Vaccination On or After 60th Birthday: No Implantable Devices No devices added Hospitalization / Surgery History Type of Hospitalization/Surgery 04/25/2020 bilateral achills tendon release 2005 tonsil and adenoids removed Family and Social History Cancer: No; Diabetes: Yes - Maternal Grandparents,Paternal Grandparents,Mother,Father; Heart Disease: Yes - Mother; Hereditary Spherocytosis: No; Hypertension: Yes - Paternal Grandparents,Maternal Grandparents,Father; Kidney Disease: No; Lung Disease: No; Seizures: No; Stroke: Yes - Paternal Grandparents; Thyroid Problems:  No; Tuberculosis: No; Never smoker; Marital Status - Single; Alcohol Use: Never; Drug Use: No History; Caffeine Use: Never; Financial Concerns: No; Food, Clothing or Shelter Needs: No; Support System Lacking: No; Transportation Concerns: No Physician Affirmation I have reviewed and agree with the above information. Electronic Signature(s) Signed: 11/25/2021 3:45:58 PM By: Fredirick Maudlin MD FACS Signed: 11/27/2021 4:38:59 PM By: Blanche East RN Entered By: Blanche East on 11/25/2021 14:05:13 -------------------------------------------------------------------------------- SuperBill Details Patient Name: Date of Service: Deanna Levine, Deanna Levine 11/25/2021 Medical Record Number: [id number] Patient Account Number: [id number] Date of Birth/Sex: Treating RN: Jul 19, 2000 (21 y.o. F) Primary Care Provider: Pricilla Holm Other Clinician: Referring Provider: Treating Provider/Extender: Durenda Age in Treatment: 0 Diagnosis Coding ICD-10 Codes Code Description L73.2 Hidradenitis suppurativa E11.622 Type 2 diabetes mellitus with other skin ulcer Q87.89 Other specified congenital malformation syndromes, not elsewhere classified Facility Procedures CPT4 Code: 93552174 Description: 99213 - WOUND CARE VISIT-LEV 3 EST PT Modifier: Quantity: 1 CPT4 Code: 71595396 Description: 72897 - CHEM CAUT GRANULATION TISS ICD-10 Diagnosis Description L73.2 Hidradenitis suppurativa Modifier: Quantity: 3 Physician Procedures : CPT4 Code Description Modifier [id number] - WC PHYS LEVEL 4 - EST PT ICD-10 Diagnosis Description L73.2 Hidradenitis suppurativa E11.622 Type 2 diabetes mellitus with other skin ulcer Q87.89 Other specified congenital malformation syndromes, not  elsewhere classified Quantity: 1 : [id number] 86484 - WC PHYS CHEM CAUT GRAN TISSUE 3 ICD-10 Diagnosis Description L73.2 Hidradenitis suppurativa Quantity: Electronic Signature(s) Signed: 11/25/2021 5:24:51 PM  By: Dellie Catholic RN Signed: 11/26/2021 9:55:51 AM By: Fredirick Maudlin MD FACS Previous Signature: 11/25/2021 3:11:11 PM Version By: Fredirick Maudlin MD FACS Entered By: Dellie Catholic on 11/25/2021 16:43:00

## 2021-11-27 NOTE — Progress Notes (Signed)
[facility] Counselor Initial Adult Exam  Name: Deanna Levine Date: 11/27/2021 MRN: [id number] DOB: [birth year] PCP: [name], MD  Time spent: 60 min Caregility video; Pt is in her backyard in private & Provider is working remote in [place]:  [place] requested: No   Reason for Visit /Presenting Problem: elevated anx/dep due to recent out of work status bc of her foot issues & surgery for bone on bone situation in L foot.  Mental Status Exam: Appearance:   Casual     Behavior:  Appropriate and Sharing  Motor:  Normal  Speech/Language:   Clear and Coherent and Slow  Affect:  Appropriate  Mood:  anxious  Thought process:  normal  Thought content:    WNL  Sensory/Perceptual disturbances:    WNL  Orientation:  oriented to person, place, and time/date  Attention:  Good  Concentration:  Good  Memory:  WNL  Fund of knowledge:   Good  Insight:    Good  Judgment:   Good  Impulse Control:  Good    Risk Assessment: Danger to Self:  No Self-injurious Behavior: No Danger to Others: No Duty to Warn:no Physical Aggression / Violence:No  Access to Firearms a concern: No  Gang Involvement:No  Patient / guardian was educated about steps to take if suicide or homicide risk level increases between visits: yes; appropriate resources provided While future psychiatric events cannot be accurately predicted, the patient does not currently require acute inpatient psychiatric care and does not currently meet [facility] involuntary commitment criteria.  Substance Abuse History: Current substance abuse: No     Past Psychiatric History:   No previous psychological problems have been observed Outpatient Providers: Dr. Lesly Rubenstein, MD History of Psych Hospitalization: No  Psychological Testing:  NA    Abuse History:  Victim of: No.,  Na    Report needed: No. Victim of Neglect:No. Perpetrator of  NA   Witness / Exposure to  Domestic Violence: No   Protective Services Involvement: No  Witness to [employer] Violence:  No   Family History:  Family History  Problem Relation Age of Onset   Diabetes Mother        steroid induced; hx. colitis   Anesthesia problems Mother        severe headache lasting 2-3 days   Rheum arthritis Mother    Ulcerative colitis Mother    Fibromyalgia Mother    Henoch-Schonlein purpura Brother        in remission   Hypertension Maternal Grandmother    Hyperlipidemia Maternal Grandmother    Diabetes Maternal Grandmother    Kidney disease Maternal Grandfather    Hypertension Maternal Grandfather    Heart disease Maternal Grandfather    Hyperlipidemia Maternal Grandfather    Kidney disease Paternal Grandmother    Hypertension Paternal Grandmother    Diabetes Paternal Grandmother    Heart disease Paternal Grandmother    Hyperlipidemia Paternal Grandmother    Hypertension Paternal Grandfather    Hyperlipidemia Paternal Grandfather    Asthma Maternal Aunt    Seizures Maternal Uncle    Colon cancer Other        great aunt   Esophageal cancer Neg Hx    Esophageal varices Neg Hx    Pancreatic disease Neg Hx     Living situation: the patient lives with their family  Sexual Orientation: Straight  Relationship Status: single  Name of spouse / other:NA If a parent, number of children /  ages:NA  Support Systems: friends parents Adult nurse Stress:  Yes   Income/Employment/Disability: Application for Disability was initiated in Dec 2022; 90% complete  Military Service: No   Educational History: Education: high school diploma/GED  Religion/Sprituality/[place]: Darrick Meigs & attends [employer] - pray daily  Any cultural differences that may affect / interfere with treatment:  None noted  Recreation/Hobbies: video games, chores @ home to help  Stressors: Educational concerns   Health problems   Loss of health status that is positive    Strengths: Supportive  Relationships, Family, BF [place], Spirituality, Conservator, museum/gallery, and Able to Communicate Effectively  Barriers:  None noted   Legal History: Pending legal issue / charges: The patient has no significant history of legal issues. History of legal issue / charges:  NA  Medical History/Surgical History: reviewed Past Medical History:  Diagnosis Date   Absence seizure disorder ([place])    Allergy    Colitis 76/81/1572   Complication of anesthesia    slow to wake up from anesthesia, occ HA with anesthesia    Diabetes mellitus without complication (HCC)    type 2   Difficulty swallowing pills    Dyspraxia    Eczema    Global developmental delay    Obesity (BMI 30-39.9)    PONV (postoperative nausea and vomiting)    Precocious female puberty    Seizures ([place])    last seizure 2012 - no current med   Sleep apnea    White-Sutton syndrome     Past Surgical History:  Procedure Laterality Date   ADENOIDECTOMY     BALLOON DILATION N/A 11/23/2021   Procedure: BALLOON DILATION;  Surgeon: Thornton Park, MD;  Location: [facility] ENDOSCOPY;  Service: Gastroenterology;  Laterality: N/A;   BIOPSY  07/16/2021   Procedure: BIOPSY;  Surgeon: Thornton Park, MD;  Location: [facility] ENDOSCOPY;  Service: Gastroenterology;;   BIOPSY  11/23/2021   Procedure: BIOPSY;  Surgeon: Thornton Park, MD;  Location: [facility] ENDOS[facility];  Service: Gastroenterology;;   bone removal Left 01/16/2021   foot   COLONOSCOPY WITH PROPOFOL N/A 07/16/2021   Procedure: COLONOSCOPY WITH PROPOFOL;  Surgeon: Thornton Park, MD;  Location: [place];  Service: Gastroenterology;  Laterality: N/A;   ESOPHAGOGASTRODUODENOSCOPY (EGD) WITH PROPOFOL N/A 11/23/2021   Procedure: ESOPHAGOGASTRODUODENOSCOPY (EGD) WITH PROPOFOL;  Surgeon: Thornton Park, MD;  Location: [facility]OS[facility];  Service: Gastroenterology;  Laterality: N/A;   GASTROCNEMIUS RECESSION Bilateral 04/25/2020   Procedure: BILATERAL GASTROCNEMIUS RECESSION;  Surgeon: Newt Minion, MD;  Location: [place];  Service: Orthopedics;  Laterality: Bilateral;   SUPPRELIN IMPLANT  05/06/2011   Procedure: SUPPRELIN IMPLANT;  Surgeon: Jerilynn Mages. Gerald Stabs, MD;  Location: [place];  Service: Pediatrics;  Laterality: Right;   SUPPRELIN IMPLANT Right 06/14/2013   Procedure: REMOVAL OF SUPPRELIN IMPLANT FROM RIGHT UPPER ARM;  Surgeon: Jerilynn Mages. Gerald Stabs, MD;  Location: [place];  Service: Pediatrics;  Laterality: Right;   TONSILLECTOMY     TONSILLECTOMY AND ADENOIDECTOMY     TYMPANOSTOMY TUBE PLACEMENT     x 2    Medications: Current Outpatient Medications  Medication Sig Dispense Refill   acetaminophen (TYLENOL) 160 MG/5ML elixir Take 960 mg by mouth every 4 (four) hours as needed (headache).     azelastine (ASTELIN) 0.1 % nasal spray Place 1 spray into both nostrils 2 (two) times daily. Use in each nostril as directed (Patient taking differently: Place 1 spray into both nostrils 2 (two) times daily as needed for rhinitis.  Use in each nostril as directed) 30 mL 12   cetirizine (ZYRTEC) 10 MG tablet Take 10 mg by mouth daily as needed for allergies.     chlorhexidine (HIBICLENS) 4 % external liquid Apply topically daily as needed. (Patient taking differently: Apply topically in the morning and at bedtime.) 120 mL 0   Cholecalciferol (VITAMIN D3 GUMMIES ADULT PO) Take 2 tablets by mouth daily.     Clindamycin-Benzoyl Per, Refr, gel Use as directed up to twice a day (Patient not taking: Reported on 11/24/2021) 45 g 3   Continuous Blood Gluc Receiver (Craig) [place] 1 each by Does not apply route as directed. Use with G7 sensor to check blood sugars (Patient not taking: Reported on 11/24/2021) 1 each 1   Continuous Blood Gluc Sensor (DEXCOM G7 SENSOR) MISC Inject 1 Device into the skin as directed. Change sensor every 10 days. (Patient not taking: Reported on 11/24/2021) 3 each 5   dicyclomine (BENTYL) 20 MG tablet Take a half tablet 4 times  daily before meals. 60 tablet 3   etonogestrel (NEXPLANON) 68 MG IMPL implant 1 each (68 mg total) by Subdermal route once. 1 each 0   fluticasone (FLONASE) 50 MCG/ACT nasal spray SHAKE LIQUID AND USE 2 SPRAYS IN EACH NOSTRIL DAILY 48 g 1   ibuprofen (ADVIL) 200 MG tablet Take 600 mg by mouth every 6 (six) hours as needed for moderate pain.     meloxicam (MOBIC) 15 MG tablet Take 15 mg by mouth daily.     Menaquinone-7 (VITAMIN K2) 100 MCG CAPS Take 100 mcg by mouth daily.     mesalamine (PENTASA) 250 MG CR capsule Take 4 capsules (1,000 mg total) by mouth 3 (three) times daily. 360 capsule 5   Metamucil Fiber CHEW Chew 1 tablet by mouth daily.     Na Sulfate-K Sulfate-Mg Sulf 17.5-3.13-1.6 GM/177ML SOLN See admin instructions.     norethindrone (AYGESTIN) 5 MG tablet Take 1 tablet (5 mg total) by mouth 2 (two) times daily as needed (bleeding). For spotting take 1 tab daily. If flow take 2 tabs daily. If heavy bleeding take 3 tabs daily. When bleeding has stopped you can stop the medication. 180 tablet 3   olopatadine (PATANOL) 0.1 % ophthalmic solution Place 1 drop into both eyes 2 (two) times daily as needed for allergies.     ondansetron (ZOFRAN-ODT) 4 MG disintegrating tablet Take 1 tablet (4 mg total) by mouth every 8 (eight) hours as needed for nausea or vomiting. 30 tablet 3   spironolactone (ALDACTONE) 50 MG tablet Take 50 mg by mouth daily.     tirzepatide (MOUNJARO) 12.5 MG/0.5ML Pen Inject 12.5 mg into the skin once a week 6 mL 1   triamcinolone cream (KENALOG) 0.1 % Apply 1 Application topically 2 (two) times daily.     Zonisamide 100 MG/5ML SUSP Take 38m every night 150 mL 11   No current facility-administered medications for this visit.    Pt is allergic to pet dander  Diagnoses:  Anxiety  Plan of Care: Pt is worried for her anxiety. Provided tools to address this today. Pt will use & explore the tools given & provide feedback next session.  Target Date: 12/04/2021  Progress:  0  Frequency: Twice monthly  Modality: IBoykin Reaper LMFT

## 2021-12-01 ENCOUNTER — Ambulatory Visit: Payer: 59 | Admitting: Gastroenterology

## 2021-12-03 ENCOUNTER — Encounter: Payer: Self-pay | Admitting: Occupational Therapy

## 2021-12-03 ENCOUNTER — Ambulatory Visit: Payer: 59 | Attending: Orthopedic Surgery | Admitting: Occupational Therapy

## 2021-12-03 DIAGNOSIS — M6281 Muscle weakness (generalized): Secondary | ICD-10-CM | POA: Diagnosis present

## 2021-12-03 DIAGNOSIS — R278 Other lack of coordination: Secondary | ICD-10-CM | POA: Diagnosis present

## 2021-12-03 DIAGNOSIS — M79641 Pain in right hand: Secondary | ICD-10-CM

## 2021-12-03 DIAGNOSIS — R208 Other disturbances of skin sensation: Secondary | ICD-10-CM

## 2021-12-03 DIAGNOSIS — M79642 Pain in left hand: Secondary | ICD-10-CM | POA: Diagnosis present

## 2021-12-03 NOTE — Therapy (Signed)
OUTPATIENT OCCUPATIONAL THERAPY TREATMENT NOTE  Patient Name: Deanna Levine MRN: [id number] DOB:Apr 28, 2000, 21 y.o., female Today's Date: 12/03/2021  PCP: [name], MD REFERRING PROVIDER: Sherilyn Cooter, MD    OT End of Session - 12/03/21 1018     Visit Number 2    Number of Visits 5    Date for OT Re-Evaluation 12/25/21    Authorization Type [employer] & MCD    Authorization Time Period VL: VM    OT Start Time 1015    OT Stop Time 1047    OT Time Calculation (min) 32 min    Activity Tolerance Patient tolerated treatment well    Behavior During Therapy WFL for tasks assessed/performed            Past Medical History:  Diagnosis Date   Absence seizure disorder ([place])    Allergy    Colitis 03/55/9741   Complication of anesthesia    slow to wake up from anesthesia, occ HA with anesthesia    Diabetes mellitus without complication (HCC)    type 2   Difficulty swallowing pills    Dyspraxia    Eczema    Global developmental delay    Obesity (BMI 30-39.9)    PONV (postoperative nausea and vomiting)    Precocious female puberty    Seizures ([place])    last seizure 2012 - no current med   Sleep apnea    White-Sutton syndrome    Past Surgical History:  Procedure Laterality Date   ADENOIDECTOMY     BALLOON DILATION N/A 11/23/2021   Procedure: BALLOON DILATION;  Surgeon: Thornton Park, MD;  Location: [facility];  Service: Gastroenterology;  Laterality: N/A;   BIOPSY  07/16/2021   Procedure: BIOPSY;  Surgeon: Thornton Park, MD;  Location: [facility];  Service: Gastroenterology;;   BIOPSY  11/23/2021   Procedure: BIOPSY;  Surgeon: Thornton Park, MD;  Location: [facility];  Service: Gastroenterology;;   bone removal Left 01/16/2021   foot   COLONOSCOPY WITH PROPOFOL N/A 07/16/2021   Procedure: COLONOSCOPY WITH PROPOFOL;  Surgeon: Thornton Park, MD;  Location: [place];  Service: Gastroenterology;  Laterality: N/A;   ESOPHAGOGASTRODUODENOSCOPY  (EGD) WITH PROPOFOL N/A 11/23/2021   Procedure: ESOPHAGOGASTRODUODENOSCOPY (EGD) WITH PROPOFOL;  Surgeon: Thornton Park, MD;  Location: [facility];  Service: Gastroenterology;  Laterality: N/A;   GASTROCNEMIUS RECESSION Bilateral 04/25/2020   Procedure: BILATERAL GASTROCNEMIUS RECESSION;  Surgeon: Newt Minion, MD;  Location: [place];  Service: Orthopedics;  Laterality: Bilateral;   SUPPRELIN IMPLANT  05/06/2011   Procedure: SUPPRELIN IMPLANT;  Surgeon: Jerilynn Mages. Gerald Stabs, MD;  Location: [place];  Service: Pediatrics;  Laterality: Right;   SUPPRELIN IMPLANT Right 06/14/2013   Procedure: REMOVAL OF SUPPRELIN IMPLANT FROM RIGHT UPPER ARM;  Surgeon: Jerilynn Mages. Gerald Stabs, MD;  Location: [place];  Service: Pediatrics;  Laterality: Right;   TONSILLECTOMY     TONSILLECTOMY AND ADENOIDECTOMY     TYMPANOSTOMY TUBE PLACEMENT     x 2   Patient Active Problem List   Diagnosis Date Noted   Dysphagia    Abdominal pain    OSA (obstructive sleep apnea) 09/30/2021   Bilateral hand numbness 09/15/2021   Acne vulgaris 08/26/2021   Noninfectious gastroenteritis    Bilateral arm pain 06/12/2021   DUB (dysfunctional uterine bleeding) 05/14/2021   Diarrhea 04/24/2021   Dysuria 04/24/2021   Sinusitis 04/24/2021   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   Other adverse food reactions, not elsewhere classified, subsequent encounter 08/20/2020   Other  allergic rhinitis 08/20/2020   Allergic conjunctivitis of both eyes 08/20/2020   Encounter for general adult medical examination with abnormal findings 07/03/2020   Leg length discrepancy 06/05/2020   Hidradenitis suppurativa 06/05/2020   Intellectual disability 06/05/2020   Achilles tendon contracture, bilateral    Elevated sedimentation rate 02/07/2020   Pain in right ankle and joints of right foot 02/07/2020   Anemia 12/29/2018   Type 2 diabetes mellitus ([place]) 08/15/2017   Elevated triglycerides with high cholesterol  12/09/2016   Mild intellectual disability 08/16/2012   Laxity of ligament 08/16/2012   Delayed milestones 08/16/2012   Obesity 06/07/2011   Dyspepsia 02/11/2011   Acanthosis nigricans 02/11/2011   Global developmental delay    Petit mal without grand mal seizures ([place])    Dyspraxia     ONSET DATE: 10/22/21 (date of OT order)  REFERRING DIAG: R20.0 (ICD-10-CM) - Bilateral hand numbness   THERAPY DIAG:  Pain in left hand  Pain in right hand  Muscle weakness (generalized)  Other disturbances of skin sensation  Other lack of coordination  Rationale for Evaluation and Treatment Rehabilitation  SUBJECTIVE:   SUBJECTIVE STATEMENT: Pt reports numbness in her L hand (ring and little fingers) that started about 2 weeks ago; states tingling in her R hand is gone Pt accompanied by: self and family member, who did not come back w/ pt to treatment room  PERTINENT HISTORY: Bilateral arm/hand pain and numbness; PMH includes White Sutton syndrome dx Sept 2022 w/ h/o developmental delay, intellectual disability, seizures, and obesity; OSA; DMT2; leg length discrepancy and congenital valgus deformity; bilateral gastrocnemius recession 2/2 achilles tendon contractures Feb 2022; sx of L foot Nov 2022 for calcaneal tarsal coalitions bilaterally  PRECAUTIONS: Fall  PAIN: Are you having pain? Yes: NPRS scale: 7/10 Pain location: left hand Pain description: numbness Aggravating factors: washing clothes Relieving factors: ice/heat  PATIENT GOALS: "Helping my hands get better...stop tingling"   OBJECTIVE:   HAND DOMINANCE: Left  FUNCTIONAL OUTCOME MEASURES: Quick Dash: 45.5/100 Slight difficulty: doing heavy household chores; w/ work or other regular daily activities; sleeping Moderate difficulty: washing back Severe difficulty: opening a tight or new jar; performing recreational activities in which force or impact taken through UE; w/ impact on normal social activities No difficulty  carrying a shopping bag or briefcase; using a knife to cut food  HAND FUNCTION: Grip strength: Right: 39 lbs; Left: 35 lbs Lateral pinch: Right: 10 lbs, Left 15 lbs 3 point pinch: Right: 6 lbs, Left 8 lbs  COORDINATION: 9 Hole Peg test: Right: 32.77 sec; Left: 33.04 sec  SENSATION: WFL; within 3-4 mm via 2-point discrimination test Reports constant tingling and occasional numbness   TODAY'S TREATMENT:   Self-Care/ADLs Discussed potentially beneficial compensatory strategies for opening cans, recommended switching hands and stabilizing w/ R hand to avoid prolonged grasp of can w/ L hand due to pt's report that this task causes incr pain. Also discussed using non-slip/gripper pads to open jars and discussed adaptive jar/bottle opener w/ corresponding handouts provided.  Practiced folding laundry incorporating joint protection strategies to decrease demand on hands. Pt able to return demonstration w/out difficulty and verbalized understanding of strategies.  Therapeutic Exercise Ulnar nerve gliding w/ wrist and 4th/5th finger extension; OT facilitated positioning w/ forearm supinated and appropriate hand placement on digits. Able to complete 5x w/out difficulty  Ulnar nerve mobilization, bending L arm upward to touch palm to ear w/ head tilted toward L shoulder before straightening back down and tilting head toward R shoulder.  OT provided demonstration throughout and verbal cues prn for technique. Completed 10x w/ no incr pain/symptoms.  Using power grip exerciser w/ L hand to pick up 1" blocks and place in a container for hand strengthening. Completed 20 blocks w/ elbow extension for functional reach; no drops  Manual Therapy Gentle soft tissue mobilization w/ lotion to 4th and 5th fingers, hand, and posterior forearm in order to facilitate mobility within tissue structures and decrease tension and pain; pt tolerated intervention w/out discomfort .     PATIENT EDUCATION: Ongoing  condition-specific education; see tx section above Person educated: Patient Education method: Explanation, Demonstration, and Handouts Education comprehension: verbalized understanding and needs further education   HOME EXERCISE PROGRAM: Isolated tendon excursion exercises (printed handout provided); - Hook fist exercise - Isolated PIPJ flexion/extension of each digit - Isolated DIPJ flexion/extension of each digit - Composite thumb opposition w/ flexion/extension  GOALS: Goals reviewed with patient? Yes  SHORT TERM GOALS: Target date: 12/07/21   STG  Status:  1 Pt will demonstrate understanding of HEP designed for bilateral hand strength, ROM, and symptom management  Progressing    LONG TERM GOALS: Target date: 12/28/21  LTG  Status:  1 Pt will report overall pain within past week at < 4/10 by d/c for improved activity tolerance  Progressing  2 Pt will report being able to open a tight or new jar w/ moderate difficulty or less to indicate decreased impact of BUE pain on functional activities Baseline: severe difficulty Progressing  3 Pt will demonstrate independence w/ compensatory strategies, including AE prn (built-up handles, ergonomic tools, gloves), to decrease impact of pain on functional tasks  Progressing  4 Pt will improve grip strength in both hands by at least 5 lbs for increased functional use in housekeeping tasks and other IADLs  Baseline: Right: 39 lbs; Left: 35 lbs   09/07/21: Right: 57 lbs; Left: 43 lbs Progressing    ASSESSMENT:  CLINICAL IMPRESSION: Pt arrives for first treatment session following initial evaluation on 11/16/21. OT reviewed goals w/ pt who is agreeable to plan of care at this time. Considering inconsistency of symptoms, OT focused session on compensatory and adaptive strategies for ADLs that aggravate pain and numbness/tingling. OT also incorporated pain management strategies and introduced ulnar nerve mobilizations due to report of numbness  along ulnar nerve distribution w/ pt able to return demonstration of exercises w/out difficulty or incr symptoms. Will continue w/ short-course of OT to address strength, ROM, pain management, altered sensation, introduction of compensatory strategies/AE prn, and implementation of an HEP for symptom management and to improve functional use of BUEs during both BADLs and IADLs.  PERFORMANCE DEFICITS in functional skills including coordination, sensation, ROM, strength, pain, body mechanics, decreased knowledge of use of DME, and UE functional use.  IMPAIRMENTS are limiting patient from ADLs, IADLs, and leisure.   COMORBIDITIES has co-morbidities such as White Sutton syndrome w/ developmental delay, intellectual disability, and h/o seizures   that affects occupational performance. Patient will benefit from skilled OT to address above impairments and improve overall function.  REHAB POTENTIAL: Fair - due to persistent of pain w/ no clear etiology and limitations w/ carryover of learned strategies   PLAN: OT FREQUENCY: 1x/week  OT DURATION: 6 weeks (4 visits within 6 weeks)  PLANNED INTERVENTIONS: self care/ADL training, therapeutic exercise, therapeutic activity, manual therapy, splinting, ultrasound, paraffin, fluidotherapy, moist heat, cryotherapy, patient/family education, and DME and/or AE instructions  RECOMMENDED OTHER SERVICES: None  CONSULTED AND AGREED WITH PLAN OF CARE:  Patient and family member/caregiver  PLAN FOR NEXT SESSION: Review HEP; introduce body mechanics and AE/tools prn   Kathrine Cords, MSOT, OTR/L 12/03/2021, 10:55 AM

## 2021-12-04 ENCOUNTER — Ambulatory Visit (INDEPENDENT_AMBULATORY_CARE_PROVIDER_SITE_OTHER): Payer: 59 | Admitting: Behavioral Health

## 2021-12-04 DIAGNOSIS — F419 Anxiety disorder, unspecified: Secondary | ICD-10-CM | POA: Diagnosis not present

## 2021-12-04 NOTE — Progress Notes (Signed)
                Donnetta Hutching, LMFT

## 2021-12-04 NOTE — Progress Notes (Addendum)
[facility] Counselor/Therapist Progress Note  Patient ID: Deanna Levine, MRN: [id number],    Date: 12/04/2021  Time Spent: 30 min Caregility video; Pt is in car @ home to provide privacy & Provider is remote from Home Office   Treatment Type: Individual Therapy  Reported Symptoms: Pt is concerned for her foot care & the surgery needs she has presently. [name] awaiting AFO's. She has been addressing her Sx of anxiety using video games, Gospel music & white noise.   Pt foot is hurting & this hampers her ability to contribute to her Parent's home. She wants to plan a beach trip w/her Parents, but does not know how.  Mental Status Exam: Appearance:  Casual     Behavior: Appropriate and Sharing  Motor: Normal  Speech/Language:  Clear and Coherent  Affect: Appropriate  Mood: anxious  Thought process: normal  Thought content:   WNL  Sensory/Perceptual disturbances:   WNL  Orientation: oriented to person, place, and time/date  Attention: Fair  Concentration: Fair  Memory: [place] of knowledge:  Fair  Insight:   Fair  Judgment:  Fair  Impulse Control: Fair   Risk Assessment: Danger to Self:  No Self-injurious Behavior: No Danger to Others: No Duty to Warn:no Physical Aggression / Violence:No  Access to Firearms a concern: No  Gang Involvement:No   Subjective: [name] is trying to deal w/her Px health limitations & also keep mgmt over her anxiety.   Interventions: Solution-Oriented/Positive Psychology, Psycho-education/Bibliotherapy, and Family Systems  Diagnosis:Anxiety  Plan: Kieli wants to plan a vacation beach trip w/her Parents so everyone can relax. Everyone is worrying. [name] going to [employer] how to do this so she can assist them to plan.  Target Date: 12/31/2021  Progress: 0  Frequency: Twice monthly   Modality: Indiv Pt hopes to have a Family Therapy session w/her Parents next visit.  Target Date: 12/31/2021  Progress: 0  Frequency:  prn   Modality: [place], LMFT

## 2021-12-08 ENCOUNTER — Ambulatory Visit: Payer: 59 | Admitting: Podiatry

## 2021-12-08 ENCOUNTER — Encounter: Payer: Self-pay | Admitting: Podiatry

## 2021-12-09 ENCOUNTER — Encounter (HOSPITAL_BASED_OUTPATIENT_CLINIC_OR_DEPARTMENT_OTHER): Payer: 59 | Admitting: General Surgery

## 2021-12-10 ENCOUNTER — Ambulatory Visit: Payer: 59 | Admitting: Occupational Therapy

## 2021-12-11 ENCOUNTER — Other Ambulatory Visit: Payer: Self-pay

## 2021-12-11 DIAGNOSIS — G4733 Obstructive sleep apnea (adult) (pediatric): Secondary | ICD-10-CM

## 2021-12-17 ENCOUNTER — Encounter: Payer: Self-pay | Admitting: Occupational Therapy

## 2021-12-17 ENCOUNTER — Ambulatory Visit: Payer: 59 | Admitting: Occupational Therapy

## 2021-12-17 DIAGNOSIS — M79641 Pain in right hand: Secondary | ICD-10-CM

## 2021-12-17 DIAGNOSIS — R278 Other lack of coordination: Secondary | ICD-10-CM

## 2021-12-17 DIAGNOSIS — M79642 Pain in left hand: Secondary | ICD-10-CM

## 2021-12-17 DIAGNOSIS — R208 Other disturbances of skin sensation: Secondary | ICD-10-CM

## 2021-12-17 DIAGNOSIS — M6281 Muscle weakness (generalized): Secondary | ICD-10-CM

## 2021-12-17 NOTE — Therapy (Signed)
OUTPATIENT OCCUPATIONAL THERAPY TREATMENT NOTE  Patient Name: Deanna Levine MRN: [id number] DOB:28-Jan-2001, 21 y.o., female Today's Date: 12/17/2021  PCP: [name], MD REFERRING PROVIDER: Sherilyn Cooter, MD    OT End of Session - 12/17/21 1018     Visit Number 3    Number of Visits 5    Date for OT Re-Evaluation 12/25/21    Authorization Type [employer] & M[employer]    Authorization Time Period VL: VM    OT Start Time 1015    OT Stop Time 1055    OT Time Calculation (min) 40 min    Activity Tolerance Patient tolerated treatment well    Behavior During Therapy WFL for tasks assessed/performed            Past Medical History:  Diagnosis Date   Absence seizure disorder ([place])    Allergy    Colitis 32/35/5732   Complication of anesthesia    slow to wake up from anesthesia, occ HA with anesthesia    Diabetes mellitus without complication (HCC)    type 2   Difficulty swallowing pills    Dyspraxia    Eczema    Global developmental delay    Obesity (BMI 30-39.9)    PONV (postoperative nausea and vomiting)    Precocious female puberty    Seizures ([place])    last seizure 2012 - no current med   Sleep apnea    White-Sutton syndrome    Past Surgical History:  Procedure Laterality Date   ADENOIDECTOMY     BALLOON DILATION N/A 11/23/2021   Procedure: BALLOON DILATION;  Surgeon: Thornton Park, MD;  Location: [facility];  Service: Gastroenterology;  Laterality: N/A;   BIOPSY  07/16/2021   Procedure: BIOPSY;  Surgeon: Thornton Park, MD;  Location: [facility];  Service: Gastroenterology;;   BIOPSY  11/23/2021   Procedure: BIOPSY;  Surgeon: Thornton Park, MD;  Location: [facility];  Service: Gastroenterology;;   bone removal Left 01/16/2021   foot   COLONOSCOPY WITH PROPOFOL N/A 07/16/2021   Procedure: COLONOSCOPY WITH PROPOFOL;  Surgeon: Thornton Park, MD;  Location: [place];  Service: Gastroenterology;  Laterality: N/A;    ESOPHAGOGASTRODUODENOSCOPY (EGD) WITH PROPOFOL N/A 11/23/2021   Procedure: ESOPHAGOGASTRODUODENOSCOPY (EGD) WITH PROPOFOL;  Surgeon: Thornton Park, MD;  Location: [facility];  Service: Gastroenterology;  Laterality: N/A;   GASTROCNEMIUS RECESSION Bilateral 04/25/2020   Procedure: BILATERAL GASTROCNEMIUS RECESSION;  Surgeon: Newt Minion, MD;  Location: [place];  Service: Orthopedics;  Laterality: Bilateral;   SUPPRELIN IMPLANT  05/06/2011   Procedure: SUPPRELIN IMPLANT;  Surgeon: Jerilynn Mages. Gerald Stabs, MD;  Location: [place];  Service: Pediatrics;  Laterality: Right;   SUPPRELIN IMPLANT Right 06/14/2013   Procedure: REMOVAL OF SUPPRELIN IMPLANT FROM RIGHT UPPER ARM;  Surgeon: Jerilynn Mages. Gerald Stabs, MD;  Location: [place];  Service: Pediatrics;  Laterality: Right;   TONSILLECTOMY     TONSILLECTOMY AND ADENOIDECTOMY     TYMPANOSTOMY TUBE PLACEMENT     x 2   Patient Active Problem List   Diagnosis Date Noted   Dysphagia    Abdominal pain    OSA (obstructive sleep apnea) 09/30/2021   Bilateral hand numbness 09/15/2021   Acne vulgaris 08/26/2021   Noninfectious gastroenteritis    Bilateral arm pain 06/12/2021   DUB (dysfunctional uterine bleeding) 05/14/2021   Diarrhea 04/24/2021   Dysuria 04/24/2021   Sinusitis 04/24/2021   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   Other adverse food reactions, not elsewhere classified, subsequent encounter 08/20/2020   Other  allergic rhinitis 08/20/2020   Allergic conjunctivitis of both eyes 08/20/2020   Encounter for general adult medical examination with abnormal findings 07/03/2020   Leg length discrepancy 06/05/2020   Hidradenitis suppurativa 06/05/2020   Intellectual disability 06/05/2020   Achilles tendon contracture, bilateral    Elevated sedimentation rate 02/07/2020   Pain in right ankle and joints of right foot 02/07/2020   Anemia 12/29/2018   Type 2 diabetes mellitus ([place]) 08/15/2017   Elevated triglycerides  with high cholesterol 12/09/2016   Mild intellectual disability 08/16/2012   Laxity of ligament 08/16/2012   Delayed milestones 08/16/2012   Obesity 06/07/2011   Dyspepsia 02/11/2011   Acanthosis nigricans 02/11/2011   Global developmental delay    Petit mal without grand mal seizures ([place])    Dyspraxia     ONSET DATE: 10/22/21 (date of OT order)  REFERRING DIAG: R20.0 (ICD-10-CM) - Bilateral hand numbness   THERAPY DIAG:  Pain in left hand  Pain in right hand  Muscle weakness (generalized)  Other disturbances of skin sensation  Other lack of coordination  Rationale for Evaluation and Treatment Rehabilitation  SUBJECTIVE:   SUBJECTIVE STATEMENT: Pt reporting having a follow up appt w/ her neurologist 12/23/21 and is going to ask about the numbness in her hands and feet Pt accompanied by: self and family member (did not attend session)  PERTINENT HISTORY: Bilateral arm/hand pain and numbness; PMH includes White Sutton syndrome dx Sept 2022 w/ h/o developmental delay, intellectual disability, seizures, and obesity; OSA; DMT2; leg length discrepancy and congenital valgus deformity; bilateral gastrocnemius recession 2/2 achilles tendon contractures Feb 2022; sx of L foot Nov 2022 for calcaneal tarsal coalitions bilaterally  PRECAUTIONS: None  PAIN: Are you having pain? Yes: NPRS scale: 5/10 Pain location: right hand (5/10); left hand (2/10) Pain description: numbness Aggravating factors: activity Relieving factors: ice/heat  PATIENT GOALS: "Helping my hands get better...stop tingling"   OBJECTIVE:   HAND DOMINANCE: Left  FUNCTIONAL OUTCOME MEASURES: Quick Dash: 45.5/100 Slight difficulty: doing heavy household chores; w/ work or other regular daily activities; sleeping Moderate difficulty: washing back Severe difficulty: opening a tight or new jar; performing recreational activities in which force or impact taken through UE; w/ impact on normal social  activities No difficulty carrying a shopping bag or briefcase; using a knife to cut food  HAND FUNCTION: Grip strength: Right: 39 lbs; Left: 35 lbs Lateral pinch: Right: 10 lbs, Left 15 lbs 3 point pinch: Right: 6 lbs, Left 8 lbs  COORDINATION: 9 Hole Peg test: Right: 32.77 sec; Left: 33.04 sec  SENSATION: WFL; within 3-4 mm via 2-point discrimination test Reports paresthesias, numbness, and tingling that are intermittent and inconsistent   TODAY'S TREATMENT:  12/17/21 Therapeutic Exercise Tendon gliding (open hand, hook fist, full fist, MPJ flexion, straight/flat fist) x10 for each hand; OT providing demonstration throughout and verbal/tactile cues for understanding w/ hook fist vs full fist vs flat fist  Isolated finger PIPJ flex AROM w/ self-blocking of other digits; completed w/ both hands 10x each finger. OT provided demo and tactile/verbal cues for hand positioning  AROM of isolated finger ext w/ both hands 10x each finger; difficulty achieving isolated extension w/ OT providing verbal cues for pacing and occ blocking to facilitate isolation of digits  Finger flex and finger ext stretch completed w/ each digit, holding positions at end range each rep; OT provided initial facilitation of stretch w/ pt returning demo w/ good technique  Thumb opposition to each finger, sliding down to base of little  finger at MCPJ completed 10x w/ each hand    12/03/21 Self-Care/ADLs Discussed potentially beneficial compensatory strategies for opening cans, recommended switching hands and stabilizing w/ R hand to avoid prolonged grasp of can w/ L hand due to pt's report that this task causes incr pain. Also discussed using non-slip/gripper pads to open jars and discussed adaptive jar/bottle opener w/ corresponding handouts provided.  Practiced folding laundry incorporating joint protection strategies to decrease demand on hands. Pt able to return demonstration w/out difficulty and verbalized  understanding of strategies.  Therapeutic Exercise Ulnar nerve gliding w/ wrist and 4th/5th finger extension; OT facilitated positioning w/ forearm supinated and appropriate hand placement on digits. Able to complete 5x w/out difficulty  Ulnar nerve mobilization, bending L arm upward to touch palm to ear w/ head tilted toward L shoulder before straightening back down and tilting head toward R shoulder. OT provided demonstration throughout and verbal cues prn for technique. Completed 10x w/ no incr pain/symptoms.  Using power grip exerciser w/ L hand to pick up 1" blocks and place in a container for hand strengthening. Completed 20 blocks w/ elbow extension for functional reach; no drops  Manual Therapy Gentle soft tissue massage w/ lotion to 4th and 5th fingers, hand, and posterior forearm in order to facilitate mobility within tissue structures and decrease tension and pain; pt tolerated intervention w/out discomfort     PATIENT EDUCATION: Reviewed full HEP (see below) and discussed progress toward goals, reiterating benefit of compensatory strategies to be further addressed in next session Person educated: Patient Education method: Explanation, Demonstration, and Handouts Education comprehension: verbalized understanding and needs further education   HOME EXERCISE PROGRAM: Tendon Gliding (printed handout provided); Median and ulnar nerve glides (printed handout provided); MedBridge Access Code: [email or web address] URL: [email or web address] - Thumb Opposition with Slide  - 2 x daily - 10 reps - Seated Isolated Finger PIP Flexion AROM  - 2 x daily - 10 reps - Seated Finger MP Extension AROM with Blocking  - 2 x daily - 10 reps - Seated Finger Composite Flexion Stretch  - 2 x daily - 5 reps - 5-10 sec hold - Hand PROM Finger Extension  - 2 x daily - 5 reps - 5-10 sec hold - Seated Wrist Flexion Extension PROM  - 2 x daily - 5 reps - 5-10 sec hold   GOALS: Goals reviewed with patient?  Yes  SHORT TERM GOALS: Target date: 12/07/21   STG  Status:  1 Pt will demonstrate understanding of HEP designed for bilateral hand strength, ROM, and symptom management  Met - 12/17/21    LONG TERM GOALS: Target date: 12/28/21  LTG  Status:  1 Pt will report overall pain within past week at < 4/10 by d/c for improved activity tolerance  Progressing  2 Pt will report being able to open a tight or new jar w/ moderate difficulty or less to indicate decreased impact of BUE pain on functional activities Baseline: severe difficulty Progressing  3 Pt will demonstrate independence w/ compensatory strategies, including AE prn (built-up handles, ergonomic tools, gloves), to decrease impact of pain on functional tasks  Progressing  4 Pt will improve grip strength in both hands by at least 5 lbs for increased functional use in housekeeping tasks and other IADLs  Baseline: 11/16/21: Right: 39 lbs; Left: 35 lbs  12/17/21: Right: 36 lbs; Left: 38 lbs Progressing    ASSESSMENT:  CLINICAL IMPRESSION: Pt arrives for session 2 weeks after previous session on 12/03/21 w/ no significant  improvement in symptoms. Symptoms continue to be inconsistent both in location and type of pain ("hurting" vs numbness/tingling), and are difficult to reproduce, which makes it challenging in targeting specific structures w/ therapeutic interventions. Considering this, OT addressed review and updating of HEP, focusing on tendon gliding, AROM of isolated finger flexion and extension, as well as gentle stretching in specific planes of movement to encourage tissue mobility and light strengthening for hopeful impact on symptoms.  PERFORMANCE DEFICITS in functional skills including coordination, sensation, ROM, strength, pain, body mechanics, decreased knowledge of use of DME, and UE functional use.  IMPAIRMENTS are limiting patient from ADLs, IADLs, and leisure.   COMORBIDITIES has co-morbidities such as White Sutton syndrome w/  developmental delay, intellectual disability, and h/o seizures   that affects occupational performance. Patient will benefit from skilled OT to address above impairments and improve overall function.  REHAB POTENTIAL: Fair - due to persistent of pain w/ no clear etiology and limitations w/ carryover of learned strategies   PLAN: OT FREQUENCY: 1x/week  OT DURATION: 6 weeks (4 visits within 6 weeks)  PLANNED INTERVENTIONS: self care/ADL training, therapeutic exercise, therapeutic activity, manual therapy, splinting, ultrasound, paraffin, fluidotherapy, moist heat, cryotherapy, patient/family education, and DME and/or AE instructions  RECOMMENDED OTHER SERVICES: None  CONSULTED AND AGREED WITH PLAN OF CARE: Patient and family member/caregiver  PLAN FOR NEXT SESSION: Introduce body mechanics and AE/tools prn; review nerve glides   Kathrine Cords, MSOT, OTR/L 12/17/2021, 11:44 AM

## 2021-12-23 ENCOUNTER — Encounter: Payer: Self-pay | Admitting: Neurology

## 2021-12-23 ENCOUNTER — Ambulatory Visit (INDEPENDENT_AMBULATORY_CARE_PROVIDER_SITE_OTHER): Payer: 59 | Admitting: Neurology

## 2021-12-23 VITALS — BP 103/71 | HR 94 | Ht 64.0 in | Wt 204.4 lb

## 2021-12-23 DIAGNOSIS — G43009 Migraine without aura, not intractable, without status migrainosus: Secondary | ICD-10-CM | POA: Diagnosis not present

## 2021-12-23 DIAGNOSIS — G40409 Other generalized epilepsy and epileptic syndromes, not intractable, without status epilepticus: Secondary | ICD-10-CM | POA: Diagnosis not present

## 2021-12-23 MED ORDER — ZONISAMIDE 100 MG/5ML PO SUSP: ORAL | 11 refills | Status: DC

## 2021-12-23 NOTE — Progress Notes (Signed)
NEUROLOGY FOLLOW UP OFFICE NOTE  Deanna Levine [id number] 03/21/2000  HISTORY OF PRESENT ILLNESS: I had the pleasure of seeing Deanna Levine in follow-up in the neurology clinic on 12/23/2021.  The patient was last seen 4 months ago for primary generalized epilepsy. [name] again accompanied by her mother who helps supplement the history today.  Her 24-hour EEG in 04/2021 showed rare generalized 4 Hz spike and wave discharges with frontal predominance seen in sleep. EEG was ordered due to report of brief staring spells. On her last visit, she was started on low dose Zonisamide 36m qhs (1083mqhs). Her mother continues to notice staring off occurring twice every other day. Deanna Levine not notice it herself, her mother reminds her that she would not remember conversations or would say she had not said something. [name] still having headaches, she reports they occur once a week, her mother reminds her she complains of headaches around three times a week and takes prn Tylenol. Dizziness is better, she states it is only happening "a little." She continues to have pain in her hands and feet, EMG done with Ortho was normal. [name] now doing PT/OT. She gets 6-7 hours of interrupted sleep, waking up every 2 hours. She does see Sleep Medicine and has been diagnosed with OSA, awaiting CPAP. Mood is good, she gets sad/depressed from time to time and now sees a therapist. No falls. Her jaw on both sides hurt last night, better today.    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 She was seen by Dr. PaPosey Pronton August 2021 for concern for seizures. She was  previously followed by pediatric neurologist Dr. HiGaynell Facenotes were reviewed. Seizures began at age [age]ith multiple absence seizures daily where she would drop her hand to her side and begin drooling, unresponsive for around 2 minutes followed by sleepiness. No convulsive seizures. At that time, MRI brain and initial EEG were normal. She was treated with carbamazepine with no further seizures after 2010, however repeat EEG in 2012 reported diphasic sharply contoured slow wave activity seen bilaterally synchronously in the central, temporal, and parietal region so she was continued on medication. She had another EEG in 2015 which was normal and she was tapered off carbamazepine. Of note, on her 2017 clinic visit, her mother reported that she stares sometimes but it seems to be daydreaming or not paying attention rather than seizure activity because she would respond when called. She presented to Dr. PaPosey Pronton 2021 with her mother reporting very brief staring spells lasting a few seconds, different from her typical seizures because they were much shorter and would not recur. They appeared as if she was daydreaming or not paying attention, worse with stress. [name] accompanied today by her father who has not seen the staring episodes but states that she does daydream and sometimes would not respond, saying she does not hear them calling her. She states she feels good. She denies any gaps in time, olfactory/gustatory hallucinations, focal numbness/tingling, myoclonic jerks. No headaches, dizziness, vision changes, no falls. She has been having orthopedic difficulties in her feet and hands and was ultimately diagnosed with WSS a few months ago. She  was previously working but has been taken out of work because she is unable to stand for prolonged periods and it affects her muscle skills. She manages her own medications, her father reports she is pretty good with this. Sleep is good, she sleeps 9-10 hours. She states her  mood is okay, her father notes there has been some stress recently.   Epilepsy Risk Factors:  White-Sutton Syndrome. She had global developmental delay since infancy. There is no history of febrile convulsions, CNS infections such as meningitis/encephalitis, significant traumatic brain injury, neurosurgical procedures, or family history of seizures.  Prior AEDs: carbamazepine  Laboratory Data: EEGs: EEG 2012 reported diphasic sharply contoured slow wave activity seen bilaterally synchronously in the central, temporal, and parietal region EEG normal 2015 and 2018 48-hour EEG 04/2021 abnormal with rare generalized 4 Hz spike and wave discharges with frontal predominance seen in sleep.  MRI brain in 2003 unremarkable.  PAST MEDICAL HISTORY: Past Medical History:  Diagnosis Date   Absence seizure disorder ([place])    Allergy    Colitis 93/26/7124   Complication of anesthesia    slow to wake up from anesthesia, occ HA with anesthesia    Diabetes mellitus without complication (HCC)    type 2   Difficulty swallowing pills    Dyspraxia    Eczema    Global developmental delay    Obesity (BMI 30-39.9)    PONV (postoperative nausea and vomiting)    Precocious female puberty    Seizures ([place])    last seizure 2012 - no current med   Sleep apnea    White-Sutton syndrome     MEDICATIONS: Current Outpatient Medications on File Prior to Visit  Medication Sig Dispense Refill   acetaminophen (TYLENOL) 160 MG/5ML elixir Take 960 mg by mouth every 4 (four) hours as needed (headache).     azelastine (ASTELIN) 0.1 % nasal spray Place 1 spray into both nostrils 2 (two) times daily. Use in each nostril as directed (Patient taking differently: Place 1 spray into both nostrils 2 (two) times daily as needed for rhinitis. Use in each nostril as directed) 30 mL 12   cetirizine (ZYRTEC) 10 MG tablet Take 10 mg by mouth daily as needed for allergies.     chlorhexidine (HIBICLENS) 4 % external liquid Apply  topically daily as needed. (Patient taking differently: Apply topically in the morning and at bedtime.) 120 mL 0   Cholecalciferol (VITAMIN D3 GUMMIES ADULT PO) Take 2 tablets by mouth daily.     Clindamycin-Benzoyl Per, Refr, gel Use as directed up to twice a day (Patient not taking: Reported on 11/24/2021) 45 g 3   Continuous Blood Gluc Receiver ([place]) Winner 1 each by Does not apply route as directed. Use with G7 sensor to check blood sugars (Patient not taking: Reported on 11/24/2021) 1 each 1   Continuous Blood Gluc Sensor (DEXCOM G7 SENSOR) MISC Inject 1 Device into the skin as directed. Change sensor every 10 days. (Patient not taking: Reported on 11/24/2021) 3 each 5   dicyclomine (BENTYL) 20 MG tablet Take a half tablet 4 times daily before meals. 60 tablet 3   etonogestrel (NEXPLANON) 68 MG IMPL implant 1 each (68 mg total) by Subdermal route once. 1 each 0   fluticasone (FLONASE) 50 MCG/ACT nasal spray SHAKE LIQUID AND USE 2 SPRAYS IN EACH NOSTRIL DAILY 48 g 1   ibuprofen (ADVIL) 200 MG tablet Take 600 mg by mouth every 6 (six) hours as needed for moderate pain.  meloxicam (MOBIC) 15 MG tablet Take 15 mg by mouth daily.     Menaquinone-7 (VITAMIN K2) 100 MCG CAPS Take 100 mcg by mouth daily.     mesalamine (PENTASA) 250 MG CR capsule Take 4 capsules (1,000 mg total) by mouth 3 (three) times daily. 360 capsule 5   Metamucil Fiber CHEW Chew 1 tablet by mouth daily.     Na Sulfate-K Sulfate-Mg Sulf 17.5-3.13-1.6 GM/177ML SOLN See admin instructions.     norethindrone (AYGESTIN) 5 MG tablet Take 1 tablet (5 mg total) by mouth 2 (two) times daily as needed (bleeding). For spotting take 1 tab daily. If flow take 2 tabs daily. If heavy bleeding take 3 tabs daily. When bleeding has stopped you can stop the medication. 180 tablet 3   olopatadine (PATANOL) 0.1 % ophthalmic solution Place 1 drop into both eyes 2 (two) times daily as needed for allergies.     ondansetron (ZOFRAN-ODT) 4 MG  disintegrating tablet Take 1 tablet (4 mg total) by mouth every 8 (eight) hours as needed for nausea or vomiting. 30 tablet 3   spironolactone (ALDACTONE) 50 MG tablet Take 50 mg by mouth daily.     tirzepatide (MOUNJARO) 12.5 MG/0.5ML Pen Inject 12.5 mg into the skin once a week 6 mL 1   triamcinolone cream (KENALOG) 0.1 % Apply 1 Application topically 2 (two) times daily.     Zonisamide 100 MG/5ML SUSP Take 53m every night 150 mL 11   No current facility-administered medications on file prior to visit.    ALLERGIES: No Known Allergies  FAMILY HISTORY: Family History  Problem Relation Age of Onset   Diabetes Mother        steroid induced; hx. colitis   Anesthesia problems Mother        severe headache lasting 2-3 days   Rheum arthritis Mother    Ulcerative colitis Mother    Fibromyalgia Mother    Henoch-Schonlein purpura Brother        in remission   Hypertension Maternal Grandmother    Hyperlipidemia Maternal Grandmother    Diabetes Maternal Grandmother    Kidney disease Maternal Grandfather    Hypertension Maternal Grandfather    Heart disease Maternal Grandfather    Hyperlipidemia Maternal Grandfather    Kidney disease Paternal Grandmother    Hypertension Paternal Grandmother    Diabetes Paternal Grandmother    Heart disease Paternal Grandmother    Hyperlipidemia Paternal Grandmother    Hypertension Paternal Grandfather    Hyperlipidemia Paternal Grandfather    Asthma Maternal Aunt    Seizures Maternal Uncle    Colon cancer Other        great aunt   Esophageal cancer Neg Hx    Esophageal varices Neg Hx    Pancreatic disease Neg Hx     SOCIAL HISTORY: Social History   Socioeconomic History   Marital status: Single    Spouse name: Not on file   Number of children: 0   Years of education: 12   Highest education level: Not on file  Occupational History   Occupation: [employer]  Tobacco Use   Smoking status: Never    Passive exposure: Never   Smokeless  tobacco: Never  Vaping Use   Vaping Use: Never used  Substance and Sexual Activity   Alcohol use: No    Alcohol/week: 0.0 standard drinks of alcohol   Drug use: No   Sexual activity: Never    Birth control/protection: None  Other Topics Concern   Not  on file  Social History Narrative   Caasi is graduated. [name] doing average.    She enjoys playing softball and basketball.   Lives with her parents.    Not working.   Left handed   Two story home      Had surgery on her legs in Feb and is physical therapy twice a week.   Left handed   No caffeine   Social Determinants of Health   Financial Resource Strain: Not on file  Food Insecurity: Not on file  Transportation Needs: Not on file  Physical Activity: Not on file  Stress: Not on file  Social Connections: Not on file  Intimate Partner Violence: Not on file     PHYSICAL EXAM: Vitals:   12/23/21 0845  BP: 103/71  Pulse: 94  SpO2: 99%   General: No acute distress, flat affect Head:  Normocephalic/atraumatic Skin/Extremities: No rash, no edema Neurological Exam: alert and awake. No aphasia or dysarthria. Fund of knowledge is appropriate.  Attention and concentration are normal.   Cranial nerves: Pupils equal, round. Extraocular movements intact with no nystagmus. Visual fields full.  No facial asymmetry.  Motor: Bulk and tone normal, muscle strength 5/5 throughout with no pronator drift.   Finger to nose testing intact.  Gait narrow-based and steady, able to tandem walk adequately.  Romberg negative.   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 Her 24-hour EEG showed rare generalized 4 Hz spike and wave discharges with frontal predominance in sleep. They continue to report  staring episodes and headaches, increase Zonisamide to 29m qhs (2031mqhs). She was advised to keep a calendar of her seizures and headaches. Proceed with CPAP as planned, improved sleep hygiene will also help with headaches and seizures. She does not drive. Follow-up in 4 months, call for any changes.   Thank you for allowing me to participate in her care.  Please do not hesitate to call for any questions or concerns.    KaEllouise NewerM.D.   CC: Dr. CrSharlet Salina

## 2021-12-23 NOTE — Patient Instructions (Signed)
Good to see you.  Increase Zonisamide 173m/5mL suspension: take 181mevery night  2. Proceed with CPAP as planned  3. Keep a seizure and headache calendar  4. Follow-up in 4 months, call for any changes   Seizure Precautions: 1. If medication has been prescribed for you to prevent seizures, take it exactly as directed.  Do not stop taking the medicine without talking to your doctor first, even if you have not had a seizure in a long time.   2. Avoid activities in which a seizure would cause danger to yourself or to others.  Don't operate dangerous machinery, swim alone, or climb in high or dangerous places, such as on ladders, roofs, or girders.  Do not drive unless your doctor says you may.  3. If you have any warning that you may have a seizure, lay down in a safe place where you can't hurt yourself.    4.  No driving for 6 months from last seizure, as per [facility]  Please refer to the following link on the [facility] for more information: [email or web address]   5.  Maintain good sleep hygiene.  6.  Notify your neurology if you are planning pregnancy or if you become pregnant.  7.  Contact your doctor if you have any problems that may be related to the medicine you are taking.  8.  Call 911 and bring the patient back to the ED if:        A.  The seizure lasts longer than 5 minutes.       B.  The patient doesn't awaken shortly after the seizure  C.  The patient has new problems such as difficulty seeing, speaking or moving  D.  The patient was injured during the seizure  E.  The patient has a temperature over 102 F (39C)  F.  The patient vomited and now is having trouble breathing

## 2021-12-24 ENCOUNTER — Other Ambulatory Visit (HOSPITAL_COMMUNITY): Payer: Self-pay

## 2021-12-24 ENCOUNTER — Ambulatory Visit: Payer: 59 | Admitting: Occupational Therapy

## 2021-12-24 ENCOUNTER — Ambulatory Visit (INDEPENDENT_AMBULATORY_CARE_PROVIDER_SITE_OTHER): Payer: 59 | Admitting: Podiatry

## 2021-12-24 DIAGNOSIS — M25571 Pain in right ankle and joints of right foot: Secondary | ICD-10-CM | POA: Diagnosis not present

## 2021-12-24 DIAGNOSIS — M24573 Contracture, unspecified ankle: Secondary | ICD-10-CM | POA: Diagnosis not present

## 2021-12-24 DIAGNOSIS — B351 Tinea unguium: Secondary | ICD-10-CM | POA: Diagnosis not present

## 2021-12-24 DIAGNOSIS — Q6689 Other  specified congenital deformities of feet: Secondary | ICD-10-CM | POA: Diagnosis not present

## 2021-12-24 DIAGNOSIS — M79675 Pain in left toe(s): Secondary | ICD-10-CM | POA: Diagnosis not present

## 2021-12-24 DIAGNOSIS — M79674 Pain in right toe(s): Secondary | ICD-10-CM | POA: Diagnosis not present

## 2021-12-24 MED ORDER — DICLOFENAC SODIUM 1 % EX GEL: 4.0000 g | Freq: Four times a day (QID) | CUTANEOUS | 4 refills | Status: DC

## 2021-12-24 NOTE — Progress Notes (Signed)
  Subjective:  Patient ID: Deanna Levine, female    DOB: [birth year],  MRN: [id number]  Chief Complaint  Patient presents with   Arthritis     follow up subtalar arthritis/   Nail Problem    diabetic nail trim      21 y.o. female returns for follow-up of right foot pain.  She says the injection did not help much it may have helped for couple days  Review of Systems: Negative except as noted in the HPI. Denies N/V/F/Ch.   Objective:  There were no vitals filed for this visit. There is no height or weight on file to calculate BMI. Constitutional Well developed. Well nourished.  Vascular Foot warm and well perfused. Capillary refill normal to all digits.   Neurologic Normal speech. Oriented to person, place, and time. Epicritic sensation to light touch grossly present bilaterally.  Dermatologic Incisions are well-healed on the left foot.  She has thickened elongated dystrophic discolored nails x10  Orthopedic: The right subtalar joint range of motion remains stiff and limited, direct palpation of the sinus tarsi does not elicit pain.  She describes nonspecific pain in the dorsal midfoot, unable to reproduce this pain on physical exam, she does not seem to react directly to palpation   Multiple view plain film radiographs: No new films taken today Assessment:   1. Sinus tarsitis, right   2. Coalition, calcaneal tarsal   3. Equinus contracture of ankle   4. Pain due to onychomycosis of toenails of both feet      Plan:  Patient was evaluated and treated and all questions answered.  I do see some improvement, she did not react to any pain to palpation on the sinus tarsi or with attempted range of motion today.  Again her pain and response to physical exam is nonspecific and seems to migrate and is not any consistent place or position.  I am hard pressed to consider major surgery such as subtalar arthrodesis with finding such as this.  Her MRI did not indicate any abnormalities in  the midfoot.  I do think bracing and anti-inflammatory treatment is still in her best interest.  We spent quite a bit of time on the phone with [place] [facility] today in order to help facilitate approval for her braces which has been very difficult and her insurance has not been helpful in this matter.  Likely it appears that we will should have approval and be able to have the braces created ASAP.  Continue WBAT in either CAM boot or regular shoe gear as tolerated.  Rx diclofenac gel sent to pharmacy   Recommended debridement of the painful nails today. Sharp and mechanical debridement performed of all painful and mycotic nails today. Nails debrided in length and thickness using a nail nipper to level of comfort. Discussed treatment options including appropriate shoe gear. Follow up as needed for painful nails.    Return in about 2 months (around 02/23/2022) for follow up right foot .

## 2021-12-26 NOTE — Progress Notes (Deleted)
09/28/21- 86 yoF with developmental delay and Allergic Rhinitis, referred for sleep evaluation courtesy of Dr Erin Fulling with concern of snoring and OSA Medical problem list includes Allergic Rhinitis,Allergic Conjunctivitis,  Colitis, DM2, absence Seizure Disorder, Hidradinitis, Obesity, White-Sutton Syndrome,  HST 10/27/20- AHI 5.2/ hr, desaturation to 75%, Body weight 218 lbs -----Consult: Snoring more, has seizure throughout the night.  Epworth score-  Body weight today-211 lbs Covid vax 2 Ph[employer] Here with parents.  Had sleep study due to loud snoring, labored breathing during sleep, witnessed apnea. ENT surgery+tonsils as child. Mother is my patient on CPAP. Given symptoms and parent awareness of issues, it may be most efficient to try treating first with CPAP. Consider updating sleep study later as appropriate.   12/29/21- 84 yoF with developmental delay  followed for minimal OSA/ snoring, complicated by   Allergic Rhinitis,Allergic Conjunctivitis,  Colitis, DM2, absence Seizure Disorder, Hidradinitis, Obesity, White-Sutton Syndrome,  CPAP auto 5-15/ [facility] ordered 10/02/21 Download compliance- Body weight today- Covid vax- Flu vax-   ROS-see HPI   + = positive Constitutional:    weight loss, night sweats, fevers, chills, fatigue, lassitude. HEENT:    headaches, difficulty swallowing, tooth/dental problems, sore throat,       sneezing, itching, ear ache, +nasal congestion, post nasal drip, snoring CV:    +chest pain, orthopnea, PND, +swelling in lower extremities, anasarca,               dizziness, palpitations Resp:   shortness of breath with exertion or at rest.                productive cough,   non-productive cough, coughing up of blood.              change in color of mucus.  wheezing.   Skin:    +rash or lesions. GI:  No-   heartburn, indigestion, +abdominal pain, nausea, vomiting, diarrhea,                 change in bowel habits, loss of appetite GU: dysuria, change  in color of urine, no urgency or frequency.   flank pain. MS:   +joint pain, stiffness, decreased range of motion, back pain. Neuro-    HPI Psych:  change in mood or affect.  depression or +anxiety.   memory loss.  OBJ- Physical Exam    reported White-Sutton Synd.             +overweight General- +Alert,/ pleasant/ responsive,  Oriented, Affect-appropriate, Distress- none acute Skin- rash-none, lesions- none, excoriation- none Lymphadenopathy- none Head- atraumatic            Eyes- Gross vision intact, PERRLA, conjunctivae and secretions clear            Ears- Hearing, canals-normal            Nose- Clear, no-Septal dev, mucus, polyps, erosion, perforation             Throat- Mallampati IV , mucosa clear , drainage- none, tonsils-absent, +teeth Neck- flexible , trachea midline, no stridor , thyroid nl, carotid no bruit Chest - symmetrical excursion , unlabored           Heart/CV- RRR , no murmur , no gallop  , no rub, nl s1 s2                           - JVD- none , edema- none, stasis changes- none, varices- none  Lung- clear to P&A, wheeze- none, cough- none , dullness-none, rub- none           Chest wall-  Abd-  Br/ Gen/ Rectal- Not done, not indicated Extrem- cyanosis- none, clubbing, none, atrophy- none, strength- nl Neuro- grossly intact to observation

## 2021-12-28 ENCOUNTER — Ambulatory Visit: Payer: 59 | Admitting: Nurse Practitioner

## 2021-12-29 ENCOUNTER — Ambulatory Visit: Payer: 59 | Admitting: Internal Medicine

## 2021-12-30 DIAGNOSIS — R61 Generalized hyperhidrosis: Secondary | ICD-10-CM | POA: Insufficient documentation

## 2021-12-31 ENCOUNTER — Ambulatory Visit (INDEPENDENT_AMBULATORY_CARE_PROVIDER_SITE_OTHER): Payer: 59 | Admitting: Behavioral Health

## 2021-12-31 DIAGNOSIS — F418 Other specified anxiety disorders: Secondary | ICD-10-CM | POA: Diagnosis not present

## 2021-12-31 NOTE — Progress Notes (Signed)
                Donnetta Hutching, LMFT

## 2021-12-31 NOTE — Progress Notes (Signed)
[place] Counselor/Therapist Progress Note  Patient ID: Deanna Levine, MRN: [id number],    Date: 12/31/2021  Time Spent: 30 min Caregility video; Pt is in the car in private & Provider is remote @ [place] Office   Treatment Type: Individual Therapy  Reported Symptoms: Anx/dep due to health issues that are causing Pt concern. [name] having excess drooling @ night w/focal Sz activity happening.  Pt is in the process of her Disability determination & knows she will be asked to attend many Dr appts. Her Mother is her Advocate.  Mental Status Exam: Appearance:  Casual     Behavior: Appropriate and Sharing  Motor: Normal  Speech/Language:  Clear and Coherent and Slowed  Affect: Appropriate  Mood: anxious  Thought process: normal  Thought content:   WNL  Sensory/Perceptual disturbances:   Pt is bothered by bright light @ times  Orientation: oriented to person, place, and time/date  Attention: Good  Concentration: Good  Memory: WNL  Fund of knowledge:  Good  Insight:   Fair  Judgment:  Fair  Impulse Control: Unable to determine    Risk Assessment: Danger to Self:  No Self-injurious Behavior: No Danger to Others: No Duty to Warn:no Physical Aggression / Violence:No  Access to Firearms a concern: No  Gang Involvement:No   Subjective: Pt's Family is cooking today for the big [employer] wknd when her entire Family will share the festivities. Her good female friend whom she describes as an Aunt is visiting & staying the wknd. [name] excited for this Family time.    Interventions: Cognitive Behavioral Therapy  Diagnosis:Anxiety about health  Plan: [name] has concerns about her new Sx overnight w/the Sz activity & her excess saliva. She will alert her Mother & possibly secure a visit to the Neurologist as indicated.  Target Date: 01/30/2022  Progress: 0  Frequency: Twice monthly  Modality: [name], LMFT

## 2022-01-04 ENCOUNTER — Ambulatory Visit (INDEPENDENT_AMBULATORY_CARE_PROVIDER_SITE_OTHER): Payer: 59 | Admitting: Family

## 2022-01-04 ENCOUNTER — Encounter: Payer: Self-pay | Admitting: Family

## 2022-01-04 VITALS — BP 112/72 | HR 85 | Ht 64.27 in | Wt 195.0 lb

## 2022-01-04 DIAGNOSIS — Z975 Presence of (intrauterine) contraceptive device: Secondary | ICD-10-CM | POA: Diagnosis not present

## 2022-01-04 DIAGNOSIS — Q8789 Other specified congenital malformation syndromes, not elsewhere classified: Secondary | ICD-10-CM

## 2022-01-04 DIAGNOSIS — N921 Excessive and frequent menstruation with irregular cycle: Secondary | ICD-10-CM

## 2022-01-04 LAB — POCT HEMOGLOBIN: Hemoglobin: 11.1 g/dL (ref 11–14.6)

## 2022-01-04 NOTE — Progress Notes (Unsigned)
History was provided by the {relatives:[id number]}.  Lateria Alderman is a 21 y.o. female who is here for ***.   PCP confirmed? {yes [id number]}  [name], MD  Plan from last visit 07/14/21:   2 small lesions, swabbed for culture; ddx include HSV, folliculitis. Continue with Aygestin for BTB.    1. Vaginal lesion 2. Menorrhagia with regular cycle 3. 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   Other adverse food reactions, not elsewhere classified, subsequent encounter 08/20/2020   Other allergic rhinitis 08/20/2020   Allergic conjunctivitis of both eyes 08/20/2020   Encounter for general adult medical examination with abnormal findings 07/03/2020   Leg length discrepancy 06/05/2020   Hidradenitis suppurativa 06/05/2020   Intellectual disability 06/05/2020   Achilles tendon contracture, bilateral    Elevated sedimentation rate 02/07/2020   Pain in right ankle and joints of right foot 02/07/2020   Anemia 12/29/2018   Type 2 diabetes mellitus ([place]) 08/15/2017   Elevated triglycerides with high cholesterol 12/09/2016   Mild intellectual disability 08/16/2012   Laxity of ligament 08/16/2012   Delayed milestones 08/16/2012   Obesity 06/07/2011   Dyspepsia 02/11/2011   Acanthosis nigricans 02/11/2011   Global developmental delay    Petit mal without grand mal seizures ([place])     Dyspraxia     Current Outpatient Medications on File Prior to Visit  Medication Sig Dispense Refill   acetaminophen (TYLENOL) 160 MG/5ML elixir Take 960 mg by mouth every 4 (four) hours as needed (headache).     azelastine (ASTELIN) 0.1 % nasal spray Place 1 spray into both nostrils 2 (two) times daily. Use in each nostril as directed (Patient taking differently: Place 1 spray into both nostrils 2 (two) times daily as needed for rhinitis. Use in each nostril as directed) 30 mL 12   cetirizine (ZYRTEC) 10 MG tablet Take 10 mg by mouth daily as needed for allergies.     chlorhexidine (HIBICLENS) 4 % external liquid Apply topically daily as needed. (Patient taking differently: Apply topically in the morning and at bedtime.) 120 mL 0   Cholecalciferol (VITAMIN D3 GUMMIES ADULT PO) Take 2 tablets by mouth daily.     Clindamycin-Benzoyl Per, Refr, gel Use as directed up to twice a day 45 g 3   Continuous Blood Gluc Receiver ([place]) [place] 1 each by Does not apply route as directed. Use with G7 sensor to check blood sugars (Patient not taking: Reported on 11/24/2021) 1 each 1   Continuous Blood Gluc Sensor (DEXCOM G7 SENSOR) MISC Inject 1 Device into the skin as directed. Change sensor every 10 days. (Patient not taking: Reported on 11/24/2021) 3 each 5   diclofenac Sodium (VOLTAREN) 1 % GEL Apply 4 g topically 4 (four) times daily. 100 g 4   dicyclomine (BENTYL) 20 MG tablet Take a half tablet 4 times daily before meals. 60 tablet 3  etonogestrel (NEXPLANON) 68 MG IMPL implant 1 each (68 mg total) by Subdermal route once. 1 each 0   fluticasone (FLONASE) 50 MCG/ACT nasal spray SHAKE LIQUID AND USE 2 SPRAYS IN EACH NOSTRIL DAILY 48 g 1   ibuprofen (ADVIL) 200 MG tablet Take 600 mg by mouth every 6 (six) hours as needed for moderate pain.     meloxicam (MOBIC) 15 MG tablet Take 15 mg by mouth daily.     Menaquinone-7 (VITAMIN K2) 100 MCG CAPS Take 100 mcg by mouth daily.     mesalamine  (PENTASA) 250 MG CR capsule Take 4 capsules (1,000 mg total) by mouth 3 (three) times daily. 360 capsule 5   Metamucil Fiber CHEW Chew 1 tablet by mouth daily.     Na Sulfate-K Sulfate-Mg Sulf 17.5-3.13-1.6 GM/177ML SOLN See admin instructions.     norethindrone (AYGESTIN) 5 MG tablet Take 1 tablet (5 mg total) by mouth 2 (two) times daily as needed (bleeding). For spotting take 1 tab daily. If flow take 2 tabs daily. If heavy bleeding take 3 tabs daily. When bleeding has stopped you can stop the medication. 180 tablet 3   ondansetron (ZOFRAN-ODT) 4 MG disintegrating tablet Take 1 tablet (4 mg total) by mouth every 8 (eight) hours as needed for nausea or vomiting. 30 tablet 3   Propylene Glycol (SYSTANE BALANCE OP) Apply to eye.     spironolactone (ALDACTONE) 50 MG tablet Take 50 mg by mouth daily.     tirzepatide (MOUNJARO) 12.5 MG/0.5ML Pen Inject 12.5 mg into the skin once a week 6 mL 1   triamcinolone cream (KENALOG) 0.1 % Apply 1 Application topically 2 (two) times daily.     Zonisamide 100 MG/5ML SUSP Take 21m every night 300 mL 11   No current facility-administered medications on file prior to visit.    No Known Allergies  Physical Exam:   There were no vitals filed for this visit.  Growth %ile SmartLinks can only be used for patients less than 259years old. No LMP recorded. Patient has had an implant.  Physical Exam   Assessment/Plan: ***

## 2022-01-06 ENCOUNTER — Encounter: Payer: Self-pay | Admitting: Family

## 2022-01-06 NOTE — Progress Notes (Signed)
Mezzo brace would be best which we discussed this morning

## 2022-01-14 ENCOUNTER — Ambulatory Visit (INDEPENDENT_AMBULATORY_CARE_PROVIDER_SITE_OTHER): Payer: 59 | Admitting: Behavioral Health

## 2022-01-14 DIAGNOSIS — F418 Other specified anxiety disorders: Secondary | ICD-10-CM | POA: Diagnosis not present

## 2022-01-14 NOTE — Progress Notes (Signed)
[facility] Counselor/Therapist Progress Note  Patient ID: Deanna Levine, MRN: [id number],    Date: 01/14/2022  Time Spent: 67 min Caregility video; Pt in car w/privacy & Provider in Home Office   Treatment Type: Individual Therapy  Reported Symptoms: Feeling ok today; visited the SSA due to denial of Disability Appl & w/Mother's informed  Mental Status Exam: Appearance:  Casual     Behavior: Appropriate and Sharing  Motor: Normal  Speech/Language:  Clear and Coherent  Affect: Appropriate  Mood: normal  Thought process: normal  Thought content:   WNL  Sensory/Perceptual disturbances:   WNL  Orientation: oriented to person, place, and time/date  Attention: Good  Concentration: Good  Memory: WNL  Fund of knowledge:  Fair  Insight:   Fair  Judgment:  Fair  Impulse Control: Fair   Risk Assessment: Danger to Self:  No Self-injurious Behavior: No Danger to Others: No Duty to Warn:no Physical Aggression / Violence:No  Access to Firearms a concern: No  Gang Involvement:No   Subjective: Pt is doing well per self report & wants to handle [employer] @ home well.   Interventions: Solution-Oriented/Positive Psychology  Diagnosis:Anxiety about health  Plan: Deanna Levine is hoping to exp the Holidays in a positive way. [name] listening to Mother about cooking.  [employer] Date: 02/13/2022  Progress: 3  Frequency: Twice monthly  Modality: Deanna Reaper, LMFT

## 2022-01-14 NOTE — Progress Notes (Signed)
                Donnetta Hutching, LMFT

## 2022-01-15 ENCOUNTER — Ambulatory Visit (INDEPENDENT_AMBULATORY_CARE_PROVIDER_SITE_OTHER): Payer: 59 | Admitting: Pediatric Genetics

## 2022-01-15 ENCOUNTER — Encounter (INDEPENDENT_AMBULATORY_CARE_PROVIDER_SITE_OTHER): Payer: Self-pay | Admitting: Pediatric Genetics

## 2022-01-15 VITALS — Wt 192.6 lb

## 2022-01-15 DIAGNOSIS — Q8789 Other specified congenital malformation syndromes, not elsewhere classified: Secondary | ICD-10-CM | POA: Diagnosis not present

## 2022-01-15 NOTE — Progress Notes (Signed)
MEDICAL GENETICS FOLLOW-UP VISIT  Patient name: Deanna Levine DOB: 11-08-2000 Age: 21 y.o. MRN: [id number]  Initial Referring Provider/Specialty: Lelon Huh, MD / Pediatric Endocrinology  Date of Evaluation: 01/15/2022 Chief Complaint: White-Sutton syndrome   HPI: Deanna Levine is a 21 y.o. female who presents today for follow-up with Genetics pertaining to her known diagnosis of White-Sutton syndrome. [name] accompanied by her mother at today's visit.   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  Her prior visits with [name] in [facility] were 07/2021 and 08/2021. They return today to continue discussions with the main goal being to help them enroll in the [facility] online registry.  Since that visit: Saw Audiology in August 2023- normal hearing and no further testing recommended. Cardiology- reportedly saw earlier this year and evaluation was normal. Orthopedics- following for hand numbness (BL) and occasionally in feet. EMG/NCS was unremarkable. Referred to OT. GI- repeat EGD 10/2021 Podiatry- working on getting braces before needing to do any further surgeries. She continues to have pain in her feet that is reportedly limiting her desire to do things. Neuro- last saw in October. Still having staring spells a couple times a day. Mom feels she is having them throughout the night. Endocrine- still having bleeding for 2 weeks. Considering removing Nexplanon and then placing IUD (under anesthesia).  Sleep study- OSA, waiting on CPAP. Has lost weight- 215 lbs to 192 lbs. She feels it is somewhat unintentional and possibly due to decreased appetite from one of her medications. Family working to get disability, doing paperwork. Deanna Levine continues to spend her days at home, not working separately from jobs her  parents bring her to. There are frustrations on the mother's end of wanting Deanna Levine to take more initiative to learn life skills and not lay in bed all day. Seeing therapist weekly (virtual). Parents plan to join a future session.  Past Medical History: Past Medical History:  Diagnosis Date   Absence seizure disorder ([place])    Allergy    Colitis 01/77/9390   Complication of anesthesia    slow to wake up from anesthesia, occ HA with anesthesia    Diabetes mellitus without complication (HCC)    type 2   Difficulty swallowing pills    Dyspraxia    Eczema    Global developmental delay    Obesity (BMI 30-39.9)    PONV (postoperative nausea and vomiting)    Precocious female puberty    Seizures ([place])    last seizure 2012 - no current med   Sleep apnea    White-Sutton syndrome    Patient Active Problem List   Diagnosis Date Noted   Dysphagia    Abdominal pain    OSA (obstructive sleep apnea) 09/30/2021   Bilateral hand numbness 09/15/2021   Acne vulgaris 08/26/2021   Noninfectious gastroenteritis    Bilateral arm pain 06/12/2021   DUB (dysfunctional uterine bleeding) 05/14/2021   Diarrhea 04/24/2021   Dysuria 04/24/2021   Sinusitis 04/24/2021   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   Other adverse food reactions, not elsewhere classified, subsequent encounter 08/20/2020   Other allergic rhinitis 08/20/2020   Allergic conjunctivitis of both eyes 08/20/2020   Encounter for general adult medical examination with abnormal findings 07/03/2020   Leg length discrepancy 06/05/2020   Hidradenitis suppurativa 06/05/2020   Intellectual disability 06/05/2020   Achilles tendon contracture, bilateral  Elevated sedimentation rate 02/07/2020   Pain in right ankle and joints of right foot 02/07/2020   Anemia 12/29/2018   Type 2 diabetes mellitus ([place]) 08/15/2017   Elevated triglycerides with high cholesterol 12/09/2016   Mild intellectual disability 08/16/2012   Laxity of ligament 08/16/2012    Delayed milestones 08/16/2012   Obesity 06/07/2011   Dyspepsia 02/11/2011   Acanthosis nigricans 02/11/2011   Global developmental delay    Petit mal without grand mal seizures ([place])    Dyspraxia     Past Surgical History:  Past Surgical History:  Procedure Laterality Date   ADENOIDECTOMY     BALLOON DILATION N/A 11/23/2021   Procedure: BALLOON DILATION;  Surgeon: Thornton Park, MD;  Location: [facility];  Service: Gastroenterology;  Laterality: N/A;   BIOPSY  07/16/2021   Procedure: BIOPSY;  Surgeon: Thornton Park, MD;  [facility];  Service: Gastroenterology;;   BIOPSY  11/23/2021   Procedure: BIOPSY;  Surgeon: Thornton Park, MD;  Location: [facility];  Service: Gastroenterology;;   bone removal Left 01/16/2021   foot   COLONOSCOPY WITH PROPOFOL N/A 07/16/2021   Procedure: COLONOSCOPY WITH PROPOFOL;  Surgeon: Thornton Park, MD;  Location: [place];  Service: Gastroenterology;  Laterality: N/A;   ESOPHAGOGASTRODUODENOSCOPY (EGD) WITH PROPOFOL N/A 11/23/2021   Procedure: ESOPHAGOGASTRODUODENOSCOPY (EGD) WITH PROPOFOL;  Surgeon: Thornton Park, MD;  Location: [facility];  Service: Gastroenterology;  Laterality: N/A;   GASTROCNEMIUS RECESSION Bilateral 04/25/2020   Procedure: BILATERAL GASTROCNEMIUS RECESSION;  Surgeon: Newt Minion, MD;  Location: [place];  Service: Orthopedics;  Laterality: Bilateral;   SUPPRELIN IMPLANT  05/06/2011   Procedure: SUPPRELIN IMPLANT;  Surgeon: Jerilynn Mages. Gerald Stabs, MD;  Location: [place];  Service: Pediatrics;  Laterality: Right;   SUPPRELIN IMPLANT Right 06/14/2013   Procedure: REMOVAL OF SUPPRELIN IMPLANT FROM RIGHT UPPER ARM;  Surgeon: Jerilynn Mages. Gerald Stabs, MD;  Location: [place];  Service: Pediatrics;  Laterality: Right;   TONSILLECTOMY     TONSILLECTOMY AND ADENOIDECTOMY     TYMPANOSTOMY TUBE PLACEMENT     x 2    Social History: Social History   Social History Narrative    Deanna Levine is graduated. [name] doing average.    She enjoys playing softball and basketball.   Lives with her parents.    Not working.   Left handed   Two story home      Had surgery on her legs in Feb and is physical therapy twice a week.   Left handed   No caffeine    Medications: Current Outpatient Medications on File Prior to Visit  Medication Sig Dispense Refill   acetaminophen (TYLENOL) 160 MG/5ML elixir Take 960 mg by mouth every 4 (four) hours as needed (headache).     cetirizine (ZYRTEC) 10 MG tablet Take 10 mg by mouth daily as needed for allergies.     Cholecalciferol (VITAMIN D3 GUMMIES ADULT PO) Take 2 tablets by mouth daily.     Clindamycin-Benzoyl Per, Refr, gel Use as directed up to twice a day 45 g 3   diclofenac Sodium (VOLTAREN) 1 % GEL Apply 4 g topically 4 (four) times daily. 100 g 4   dicyclomine (BENTYL) 20 MG tablet Take a half tablet 4 times daily before meals. 60 tablet 3   etonogestrel (NEXPLANON) 68 MG IMPL implant 1 each (68 mg total) by Subdermal route once. 1 each 0   fluticasone (FLONASE) 50 MCG/ACT nasal spray SHAKE LIQUID AND USE 2 SPRAYS IN EACH NOSTRIL DAILY  48 g 1   ibuprofen (ADVIL) 200 MG tablet Take 600 mg by mouth every 6 (six) hours as needed for moderate pain.     meloxicam (MOBIC) 15 MG tablet Take 15 mg by mouth daily.     Menaquinone-7 (VITAMIN K2) 100 MCG CAPS Take 100 mcg by mouth daily.     mesalamine (PENTASA) 250 MG CR capsule Take 4 capsules (1,000 mg total) by mouth 3 (three) times daily. 360 capsule 5   Metamucil Fiber CHEW Chew 1 tablet by mouth daily.     Na Sulfate-K Sulfate-Mg Sulf 17.5-3.13-1.6 GM/177ML SOLN See admin instructions.     norethindrone (AYGESTIN) 5 MG tablet Take 1 tablet (5 mg total) by mouth 2 (two) times daily as needed (bleeding). For spotting take 1 tab daily. If flow take 2 tabs daily. If heavy bleeding take 3 tabs daily. When bleeding has stopped you can stop the medication. 180 tablet 3   ondansetron  (ZOFRAN-ODT) 4 MG disintegrating tablet Take 1 tablet (4 mg total) by mouth every 8 (eight) hours as needed for nausea or vomiting. 30 tablet 3   Propylene Glycol (SYSTANE BALANCE OP) Apply to eye.     spironolactone (ALDACTONE) 50 MG tablet Take 50 mg by mouth daily.     tirzepatide (MOUNJARO) 12.5 MG/0.5ML Pen Inject 12.5 mg into the skin once a week 6 mL 1   triamcinolone cream (KENALOG) 0.1 % Apply 1 Application topically 2 (two) times daily.     Zonisamide 100 MG/5ML SUSP Take 50m every night 300 mL 11   azelastine (ASTELIN) 0.1 % nasal spray Place 1 spray into both nostrils 2 (two) times daily. Use in each nostril as directed (Patient not taking: Reported on 01/15/2022) 30 mL 12   chlorhexidine (HIBICLENS) 4 % external liquid Apply topically daily as needed. (Patient not taking: Reported on 01/15/2022) 120 mL 0   Continuous Blood Gluc Receiver ([place]VI 1 each by Does not apply route as directed. Use with G7 sensor to check blood sugars (Patient not taking: Reported on 11/24/2021) 1 each 1   Continuous Blood Gluc Sensor (DEXCOM G7 SENSOR) MISC Inject 1 Device into the skin as directed. Change sensor every 10 days. (Patient not taking: Reported on 11/24/2021) 3 each 5   No current facility-administered medications on file prior to visit.    Allergies:  No Known Allergies  Immunizations: Up to date  Review of Systems (updates in bold): General: White Sutton syndrome. Obesity -- weight loss of ~20lb since last visit 4 months ago (decreased appetite from a medication). Eyes/vision: glasses- farsighted. Eyes crossed when younger.  Ears/hearing: tonsils and adenoids removed, tubes placed due to recurrent ear infections as child. Excessive wax in ears. Normal eval 09/2021. Dental: no concerns. Respiratory: sleep apnea, getting CPAP soon. Cardiovascular: no concerns. Normal eval 09/2021. Gastrointestinal: chronic diarrhea- colitis. Genitourinary: no concerns. Normal kidneys on  CT abdomen. Endocrine: h/o precocious puberty. Type 2 diabetes. Metabolic syndrome. Has nexplanon implant and norethindrone but continues to have breakthrough bleeding, will f/u with OB GYN for possible IUD under anesthesia. Had pap smear done under anesthesia 10/2021. Hematologic: no concerns. Immunologic: no concerns. Neurological: Intellectual disability. History of seizures- recent EEG (04/2021) abnormal, considering medications but not currently on. Has had normal brain MRI in the past. Psychiatric: Anxiety. Musculoskeletal: "Limber"/flexible as a child. Leg length discrepancy. H/o bilateral achilles tendon contractures- s/p bilateral gastrocnemius recessions. Bilateral calcaneal tarsal coalitions- s/p surgery on left foot in November 2022. Foot pain. Skin, Hair,  Nails: constantly has sores. Hidradentitis supparativa. Sees dermatology.  Family History: No updates to family history since last visit  Physical Examination: Wt 192 lb 9.6 oz (87.4 kg)   BMI 32.78 kg/m   General: Alert but quiet and speaks mainly when prompted or when a question is directed at her; weight loss since last visit Head: Normocephalic, full cheeks Eyes: Mild hypertelorism otherwise normoset, Normal lids, lashes, brows, wearing glasses Nose: Normal appearance Lips/Mouth/Teeth: Normal appearance; central maxillary incisors are rotated inward Ears: Normoset and normally formed, no pits, tags or creases Neck: Normal appearance Heart: Warm and well perfused Lungs: No increased work of breathing Hair: Normal anterior and posterior hairline, normal texture Neurologic: Normal gross motor by observation, no abnormal movements Psych: Straightforward responses, friendly demeanor, able to help participate in filling out the registry by typing in email/making password and providing answers to some basic questions  Updated Genetic testing: None  Pertinent New Labs: Elevated fecal calprotectin HgbA1c 5.9 Hemoglobin  11.1  Pertinent New Imaging/Studies: NCV/EMG 09/2021: Impression: Essentially NORMAL electrodiagnostic study of both upper limbs.  There is no significant electrodiagnostic evidence of nerve entrapment, brachial plexopathy or cervical radiculopathy.     As you know, purely sensory or demyelinating radiculopathies and chemical radiculitis may not be detected with this particular electrodiagnostic study.  **As you know, this particular electrodiagnostic study cannot rule out chemical radiculitis or sensory only radiculopathy.   ECHO 09/2021:  1. Left ventricular ejection fraction, by estimation, is 60 to 65%. The  left ventricle has normal function. The left ventricle has no regional  wall motion abnormalities. There is mild left ventricular hypertrophy.  Left ventricular diastolic parameters  were normal.   2. Right ventricular systolic function is normal. The right ventricular  size is normal. Tricuspid regurgitation signal is inadequate for assessing  PA pressure.   3. The mitral valve is normal in structure. No evidence of mitral valve  regurgitation.   4. The aortic valve was not well visualized. Aortic valve regurgitation  is not visualized. No aortic stenosis is present.   5. The inferior vena cava is normal in size with greater than 50%  respiratory variability, suggesting right atrial pressure of 3 mmHg.   Assessment: Deanna Levine is a 21 y.o. 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 This diagnosis is consistent with much of her medical and developmental history, including developmental delays, intellectual disability, seizures, obesity (and comorbidities such as sleep apnea and type 2 diabetes) and GI concerns. She additionally has skin concerns and chronic foot pain that are not necessarily due to this genetic condition from what is currently known.   At our prior 2 visits this year, we have discussed at length the diagnosis of White-Sutton  syndrome and current management/surveillance recommendations. [name] currently up to date on all these areas now that she has had baseline audiology evaluation and ECHO.  Because this is a relatively newly described condition, there is still much to be learned about White-Sutton syndrome. There is a POGZ registry through [employer] ([email or web address]) that we helped enroll Deanna Levine in today and assisted in entering her medical information. She may be contacted through the registry for updated research opportunities.   One of the family's greatest frustrations and challenges at the moment continues to be navigating how to best support and help Deanna Levine learn independent skills and safely navigate transportation, having a job, etc, now that she is an adult. We will continue to look for any resources for the family. An  adult day program would be beneficial for her to give Bich some independence in a supervised setting to learn ADLs and socialize with peers while taking some of this responsibility off her mother.  Recommendations: Now up to date on surveillance recommendations POGZ Registry completed today We will continue to look for resources for the family   Follow-up in 2 years (12/2023).   Heidi Dach, MS, [facility] Certified Genetic Counselor  Artist Pais, D.O. Attending Physician Medical Genetics Date: 01/26/2022 Time: 1:53pm  Total time spent: 60 minutes Time spent includes face to face and non-face to face care for the patient on the date of this encounter (history and physical, genetic counseling, coordination of care, data gathering and/or documentation as outlined)

## 2022-01-15 NOTE — Patient Instructions (Signed)
At [facility], we are committed to providing exceptional care. You will receive a patient satisfaction survey through text or email regarding your visit today. Your opinion is important to me. Comments are appreciated.   Thank you for joining the [facility] registry. Please let us know if they contact you with any new information or studies that we could help with.

## 2022-01-28 ENCOUNTER — Encounter (INDEPENDENT_AMBULATORY_CARE_PROVIDER_SITE_OTHER): Payer: Self-pay | Admitting: Pediatric Endocrinology

## 2022-01-28 NOTE — Telephone Encounter (Signed)
Called pt and R/s pt on Dec 21st at 11:45.

## 2022-01-31 NOTE — Progress Notes (Unsigned)
01/31/2022 Bridgette Wolden [id number] 10/03/2000   Chief Complaint:  History of Present Illness:  Deanna Levine is a 21  year old female with a past medical history of anxiety, obesity, developmental delay, expressive speech disorder, DM II, sleep apnea, epilepsy/absence seizures, (White Angela Adam Syndrome), chronic diarrhea, microcytic anemia, thrombocytosis, chronic diarrhea with lower abdominal pain.     She underwent a colonoscopy at [facility] 07/16/2021 which identified erythematous mucosa to the cecum otherwise showed a normal colon and TI.  Biopsies of the cecum showed severely active chronic nonspecific colitis with ulceration without granulomas or dysplasia.  Biopsies of the TI showed prominent lymphoid aggregates otherwise was unremarkable.  A repeat fecal calprotectin level was elevated at 137 on 07/20/2021.   She underwent a CT enterography with contrast 08/06/2021 showed diverticulosis without evidence of diverticulitis or colitis.  The stomach was distended by ingested material with wall thickening versus focal peristalsis at the pylorus/gastric antrum possibly suggestive of gastric hypomotility versus gastric outlet obstruction.    still having bleeding for 2 weeks. Considering removing Nexplanon and then placing IUD (under anesthesia). OSA, waiting on CPAP. Has lost weight- 215 lbs to 192 lbs. She feels it is somewhat unintentional and possibly due to decreased appetite from one of her medications.     Latest Ref Rng & Units 01/04/2022   11:25 AM 08/28/2021    9:50 AM 02/12/2021   11:04 AM  CBC  WBC 4.0 - 10.5 K/uL  8.5  8.6   Hemoglobin 11 - 14.6 g/dL 11.1  11.1  11.2   Hematocrit 36.0 - 46.0 %  35.1  36.6   Platelets 150.0 - 400.0 K/uL  347.0  409        Latest Ref Rng & Units 08/24/2021    2:18 PM 08/06/2021    8:46 AM 02/12/2021   11:04 AM  CMP  Glucose 65 - 139 mg/dL 126   111   BUN 7 - 25 mg/dL 10   14   Creatinine 0.50 - 0.96 mg/dL 0.70  0.70  0.64    Sodium 135 - 146 mmol/L 139   138   Potassium 3.5 - 5.3 mmol/L 3.9   4.1   Chloride 98 - 110 mmol/L 105   102   CO2 20 - 32 mmol/L 23   22   Calcium 8.6 - 10.2 mg/dL 9.7   10.7   Total Protein 6.1 - 8.1 g/dL 7.4   8.4   Total Bilirubin 0.2 - 1.2 mg/dL 0.6   0.5   AST 10 - 30 U/L 11   15   ALT 6 - 29 U/L 11   15      GI PROCEDURES:  Colonoscopy 07/16/2021 at [facility]: otherwise without abnormality on direct and retroflexion views. - Erythematous mucosa in the cecum. This is of unclear clinical significance. Biopsied. - The entire examined colon is normal. Biopsied. - The examined portion of the ileum was normal. Biopsied. - The examination was otherwise normal on direct and retroflexion views.   A. TERMINAL ILEUM, BIOPSY:  - Ileal mucosa with prominent lymphoid aggregates (Peyer's patches)  otherwise unremarkable  - Negative for increased intraepithelial lymphocytes or villous  architectural changes  - Negative for acute inflammation, features of chronicity or granulomas   B. COLON, CECUM, BIOPSY:  - Severely active chronic nonspecific colitis with ulceration, see  comment  - Negative for granulomas or dysplasia   C. COLON, ASCENDING, BIOPSY:  - Colonic mucosa with no  specific histopathologic changes  - Negative for acute inflammation, features of chronicity, granulomas or  dysplasia   D. COLON, TRANSVERSE, BIOPSY:  - Colonic mucosa with no specific histopathologic changes  - Negative for acute inflammation, features of chronicity, granulomas or  dysplasia   E. COLON, DESCENDING, BIOPSY:  - Colonic mucosa with no specific histopathologic changes  - Negative for acute inflammation, features of chronicity, granulomas or  dysplasia   EGD 11/23/2021: - No endoscopic esophageal abnormality to explain patient's dysphagia. Esophagus dilated. Biopsied. - Residual foods fibers in the stomach. - Normal stomach. Biopsied. - Normal examined duodenum. Biopsied. - The  examination was otherwise normal.  IMAGE STUDIES:   CT enterography 08/07/2021: FINDINGS: Lower chest:  No acute abnormality.   Hepatobiliary: Hypodensity along the falciform ligament commonly reflects hepatic steatosis or differential perfusion (veins of [place]). Gallbladder is unremarkable. No biliary ductal dilation.   Pancreas: No pancreatic ductal dilation or evidence of acute inflammation.   Spleen: No splenomegaly or focal splenic lesion.   Adrenals/Urinary Tract: Bilateral adrenal glands appear normal. No hydronephrosis. Kidneys demonstrate symmetric enhancement. Urinary bladder is unremarkable for degree of distension.   Stomach/Bowel: Stomach is distended by ingested material with wall thickening versus focal peristalsis at the pylorus/gastric antrum. No pathologic dilation of small or large bowel. Appendix is not confidently identified however there is no pericecal inflammation. Terminal ileum appears normal. No evidence of acute bowel inflammation. Colonic diverticulosis without findings of acute diverticulitis.   Vascular/Lymphatic: Normal caliber abdominal aorta. No pathologically enlarged abdominal or pelvic lymph nodes.   Reproductive: No mass or other significant abnormality.   Other: Significant abdominopelvic free fluid.  No pneumoperitoneum.   Musculoskeletal: No suspicious bone lesions identified.   IMPRESSION: 1. No convincing evidence of acute bowel inflammation on this slightly motion degraded examination.   2.  Colonic diverticulosis without findings of acute diverticulitis.   3. Stomach is distended by ingested material with wall thickening versus focal peristalsis at the pylorus/gastric antrum, consider correlation for gastric hypomotility or outlet obstruction.  Current Medications, Allergies, Past Medical History, Past Surgical History, Family History and Social History were reviewed in [employer] record.   Review  of Systems:   Constitutional: Negative for fever, sweats, chills or weight loss.  Respiratory: Negative for shortness of breath.   Cardiovascular: Negative for chest pain, palpitations and leg swelling.  Gastrointestinal: See HPI.  Musculoskeletal: Negative for back pain or muscle aches.  Neurological: Negative for dizziness, headaches or paresthesias.    Physical Exam: There were no vitals taken for this visit. General: Well developed, w   ***female in no acute distress. Head: Normocephalic and atraumatic. Eyes: No scleral icterus. Conjunctiva pink . Ears: Normal auditory acuity. Mouth: Dentition intact. No ulcers or lesions.  Lungs: Clear throughout to auscultation. Heart: Regular rate and rhythm, no murmur. Abdomen: Soft, nontender and nondistended. No masses or hepatomegaly. Normal bowel sounds x 4 quadrants.  Rectal: *** Musculoskeletal: Symmetrical with no gross deformities. Extremities: No edema. Neurological: Alert oriented x 4. No focal deficits.  Psychological: Alert and cooperative. Normal mood and affect  Assessment and Recommendations: ***

## 2022-02-01 ENCOUNTER — Ambulatory Visit: Payer: 59 | Admitting: *Deleted

## 2022-02-01 ENCOUNTER — Encounter: Payer: Self-pay | Admitting: Nurse Practitioner

## 2022-02-01 ENCOUNTER — Ambulatory Visit (INDEPENDENT_AMBULATORY_CARE_PROVIDER_SITE_OTHER): Payer: 59 | Admitting: Nurse Practitioner

## 2022-02-01 VITALS — BP 110/78 | HR 101 | Ht 64.0 in | Wt 194.3 lb

## 2022-02-01 DIAGNOSIS — M24573 Contracture, unspecified ankle: Secondary | ICD-10-CM

## 2022-02-01 DIAGNOSIS — M2141 Flat foot [pes planus] (acquired), right foot: Secondary | ICD-10-CM

## 2022-02-01 DIAGNOSIS — M2142 Flat foot [pes planus] (acquired), left foot: Secondary | ICD-10-CM

## 2022-02-01 DIAGNOSIS — K5190 Ulcerative colitis, unspecified, without complications: Secondary | ICD-10-CM

## 2022-02-01 DIAGNOSIS — E119 Type 2 diabetes mellitus without complications: Secondary | ICD-10-CM

## 2022-02-01 DIAGNOSIS — Q6689 Other  specified congenital deformities of feet: Secondary | ICD-10-CM

## 2022-02-01 DIAGNOSIS — R1310 Dysphagia, unspecified: Secondary | ICD-10-CM

## 2022-02-01 NOTE — Progress Notes (Signed)
Patient presents today to pick up custom molded Deanna Levine brace recommended by Dr. Sherryle Lis.   Brace were dispensed and fit was satisfactory. Advised patient to go take braces with her to go purchase new shoes as they will not fit in her current width. Patient and her father verbalized understanding.   Patient will follow up as needed.

## 2022-02-01 NOTE — Patient Instructions (Addendum)
Please start Pentasa 281m 4 capsules by mouth as previously ordered by Dr. BTarri Glennfor you colitis. Ok to open up the Pentasa capsule and pour granules into 1 or 2 tablespoons of applesauce or yogurt.   Follow up with Dr. BTarri Glennin 3 months   Contact our office if your swallowing difficulties worsen.  Thank you for trusting me with your gastrointestinal care!   CCarl Levine CRNP

## 2022-02-05 ENCOUNTER — Ambulatory Visit (INDEPENDENT_AMBULATORY_CARE_PROVIDER_SITE_OTHER): Payer: 59 | Admitting: Behavioral Health

## 2022-02-05 DIAGNOSIS — F419 Anxiety disorder, unspecified: Secondary | ICD-10-CM

## 2022-02-05 DIAGNOSIS — F418 Other specified anxiety disorders: Secondary | ICD-10-CM | POA: Diagnosis not present

## 2022-02-05 NOTE — Progress Notes (Addendum)
[facility] Counselor/Therapist Progress Note  Patient ID: Deanna Levine, MRN: [id number],    Date: 02/05/2022  Time Spent: 55 min Caregility video: Pt is home in her room in private      & Provider is remote in Home Office   Treatment Type: Individual Therapy  Reported Symptoms: elevated anxiety;  Pt is sick today  Mental Status Exam: Appearance:  Casual     Behavior: Appropriate and Sharing  Motor: Normal  Speech/Language:  Clear and Coherent  Affect: Appropriate  Mood: normal  Thought process: concrete  Thought content:   WNL  Sensory/Perceptual disturbances:   WNL  Orientation: oriented to person, place, and time/date  Attention: Good  Concentration: Good  Memory: WNL  Fund of knowledge:  Good  Insight:   Fair  Judgment:  Fair  Impulse Control: Good   Risk Assessment: Danger to Self:  No Self-injurious Behavior: No Danger to Others: No Duty to Warn:no Physical Aggression / Violence:No  Access to Firearms a concern: No  Gang Involvement:No   Subjective: Pt is glad her Sleep Study next Wed will help her to stop snoring.    Interventions: Solution-Oriented/Positive Psychology  Diagnosis:Anxiety  Anxiety about health  Plan: Deanna Levine is smiling today & glad to be on the Virtual visit. She has been playing video games excessively. She will try to limit her time to 2-3 hrs/day. She will check w/her Parents , her PCP & Neurologist to ensure this amt of time is healthy. [employer] Date: 03/08/2022 Progress: 0 Frequency: Once monthly Modality: Deanna Levine is excited to go Christmas shopping. [name] going w/her Mom & also needs to purchase new shoes to fit her AFO's. She will do this prior to her appt on Dec 26th w/Dr. Sherryle Lis.  [employer] Date: 03/08/2022  Progress: 0  Frequency: Once monthly  Modality: Deanna Levine wants to drive around & see the Christmas lights. She will go w/her Parents right after Christmas.  [employer] Date: 03/08/2022  Progress:  0  Frequency: Once monthly  Modality: Deanna Reaper, LMFT

## 2022-02-05 NOTE — Progress Notes (Signed)
                Donnetta Hutching, LMFT

## 2022-02-10 ENCOUNTER — Ambulatory Visit (HOSPITAL_BASED_OUTPATIENT_CLINIC_OR_DEPARTMENT_OTHER): Payer: 59 | Attending: Internal Medicine | Admitting: Internal Medicine

## 2022-02-10 VITALS — Ht 64.0 in | Wt 187.0 lb

## 2022-02-10 DIAGNOSIS — R0683 Snoring: Secondary | ICD-10-CM | POA: Diagnosis not present

## 2022-02-10 DIAGNOSIS — E669 Obesity, unspecified: Secondary | ICD-10-CM | POA: Insufficient documentation

## 2022-02-10 DIAGNOSIS — Z6832 Body mass index (BMI) 32.0-32.9, adult: Secondary | ICD-10-CM | POA: Insufficient documentation

## 2022-02-10 DIAGNOSIS — G4733 Obstructive sleep apnea (adult) (pediatric): Secondary | ICD-10-CM

## 2022-02-10 DIAGNOSIS — R519 Headache, unspecified: Secondary | ICD-10-CM | POA: Diagnosis not present

## 2022-02-11 ENCOUNTER — Ambulatory Visit: Payer: 59 | Admitting: Podiatry

## 2022-02-18 ENCOUNTER — Encounter (INDEPENDENT_AMBULATORY_CARE_PROVIDER_SITE_OTHER): Payer: Self-pay | Admitting: Pediatric Endocrinology

## 2022-02-18 ENCOUNTER — Ambulatory Visit (INDEPENDENT_AMBULATORY_CARE_PROVIDER_SITE_OTHER): Payer: 59 | Admitting: Pediatric Endocrinology

## 2022-02-18 VITALS — BP 124/72 | HR 103 | Wt 185.8 lb

## 2022-02-18 DIAGNOSIS — L989 Disorder of the skin and subcutaneous tissue, unspecified: Secondary | ICD-10-CM | POA: Diagnosis not present

## 2022-02-18 DIAGNOSIS — E119 Type 2 diabetes mellitus without complications: Secondary | ICD-10-CM | POA: Diagnosis not present

## 2022-02-18 DIAGNOSIS — Z7985 Long-term (current) use of injectable non-insulin antidiabetic drugs: Secondary | ICD-10-CM

## 2022-02-18 DIAGNOSIS — R569 Unspecified convulsions: Secondary | ICD-10-CM

## 2022-02-18 DIAGNOSIS — N939 Abnormal uterine and vaginal bleeding, unspecified: Secondary | ICD-10-CM | POA: Diagnosis not present

## 2022-02-18 DIAGNOSIS — E11649 Type 2 diabetes mellitus with hypoglycemia without coma: Secondary | ICD-10-CM

## 2022-02-18 LAB — POCT GLYCOSYLATED HEMOGLOBIN (HGB A1C): HbA1c, POC (prediabetic range): 6.4 % (ref 5.7–6.4)

## 2022-02-18 LAB — POCT GLUCOSE (DEVICE FOR HOME USE): Glucose Fasting, POC: 90 mg/dL (ref 70–99)

## 2022-02-18 NOTE — Progress Notes (Signed)
Subjective:  Subjective  Patient Name: Deanna Levine Date of Birth: Oct 09, 2000  MRN: [id number]  Emaly Boschert  presents to clinic today for follow-up evaluation and management of her  obesity, prediabetes, acanthosis   HISTORY OF PRESENT ILLNESS:   Deanna Levine is a 21 y.o. AA female   Deanna Levine is accompanied by mom and dad (in [place]) today.   69. Deanna Levine was initially followed in pediatric endocrine clinic for early puberty complicated by profound developmental delay. [name] now post menarchal and follows in clinic for Type 2 Diabetes and metabolic syndrome.    2. The patient's last [facility] visit was on 11/24/21  She has continued on 12.5 mg of Mounjaro weekly. She has been pleased with the weight changes. She says that she is eating but just eating less.   [name] mostly staying at home and playing on the PS4. She has been walking around the neighborhood. [name] going walking 3 days a week in the neighborhood. [name] meant to limit time on her feet to less than 2 hours a day. Her orthopedist seems happy with how she is doing.   She says that she had to get new pants because her pants kept falling off. She got new shoes at [employer] for her new orthotic braces.   [name] not able to have a job. [name] now on disability.   She has continued to have concerns about nocturnal seizures. She had a sleep study last week. She hasn't got her results. [name] trying to get CPAP.   [name] unsure if she is having seizures at night. She had a 24 hour home EEG in February 2023 which showed that she was having bilateral nocturnal seizures. [name] on different medication now. [name] not sure when she will be having a repeat EEG. [name] no longer allowed to drive.   Her dermatologist prescribed Spironolactone - she is taking this now. [name] starting to see some changes in her face hair.   [name] still having some breakthrough bleeding even with her Nexplanon and her Norethindrone. She has prolonged bleeding.  She says that her GYN doc doesn't know what to do. She wants to have her Nexplanon removed.  [name] now seeing a therapist virtually. [name] seeing them about every other week.    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     3. Pertinent Review of Systems:  Constitutional: The patient feels "good". The patient seems healthy and active. Eyes: Vision seems to be good. There are no recognized eye problems. Wears glasses.  Neck: The patient has no complaints of anterior neck swelling, soreness, tenderness, pressure, discomfort, or difficulty swallowing.   Heart: Heart rate increases with exercise or other physical activity. The patient has no complaints of palpitations, irregular heart beats, chest pain, or chest pressure.   Lungs: no asthma or wheezing.  Gastrointestinal: Bowel movents seem normal. The patient has no complaints of excessive hunger, acid reflux, upset stomach, stomach aches or pains, diarrhea. Some constipation. [name] seeing a new GI doc for increased gas.  Legs: Muscle mass and strength seem normal. There are no complaints of numbness, tingling, burning, or pain. No edema is noted.  Feet: Per HPI Neurologic: Has been having staring spells. [name] followed by [name] in adult neurology GYN/GU: Menarche 07/2014, age 18. LMP 9/16- she is still having flow.  Skin: sores on her chest. - under her breasts and her armpits. Derm gave her some cream and it is maybe helping. [name] seeing wound care on 9/27 due to bleeding from open wound on her chest.   Annual labs- done with PCP  in 2023. Due Spring 2024    PAST MEDICAL, FAMILY, AND SOCIAL HISTORY  Past Medical History:  Diagnosis Date   Absence seizure disorder ([place])    Allergy    Colitis 17/61/6073   Complication of anesthesia    slow to wake up from anesthesia, occ HA with anesthesia    Diabetes mellitus without complication (HCC)    type 2    Difficulty swallowing pills    Dyspraxia    Eczema    Global developmental delay    Obesity (BMI 30-39.9)    PONV (postoperative nausea and vomiting)    Precocious female puberty    Seizures ([place])    last seizure 2012 - no current med   Sleep apnea    White-Sutton syndrome     Family History  Problem Relation Age of Onset   Diabetes Mother        steroid induced; hx. colitis   Anesthesia problems Mother        severe headache lasting 2-3 days   Rheum arthritis Mother    Ulcerative colitis Mother    Fibromyalgia Mother    Henoch-Schonlein purpura Brother        in remission   Hypertension Maternal Grandmother    Hyperlipidemia Maternal Grandmother    Diabetes Maternal Grandmother    Kidney disease Maternal Grandfather    Hypertension Maternal Grandfather    Heart disease Maternal Grandfather    Hyperlipidemia Maternal Grandfather    Kidney disease Paternal Grandmother    Hypertension Paternal Grandmother    Diabetes Paternal Grandmother    Heart disease Paternal Grandmother    Hyperlipidemia Paternal Grandmother    Hypertension Paternal Grandfather    Hyperlipidemia Paternal Grandfather    Asthma Maternal Aunt    Seizures Maternal Uncle    Colon cancer Other        great aunt   Esophageal cancer Neg Hx    Esophageal varices Neg Hx    Pancreatic disease Neg Hx      Current Outpatient Medications:    acetaminophen (TYLENOL) 160 MG/5ML elixir, Take 960 mg by mouth every 4 (four) hours as needed (headache)., Disp: , Rfl:    azelastine (ASTELIN) 0.1 % nasal spray, Place 1 spray into both nostrils 2 (two) times daily. Use in each nostril as directed, Disp: 30 mL, Rfl: 12   cetirizine (ZYRTEC) 10 MG tablet, Take 10 mg by mouth daily as needed for allergies., Disp: , Rfl:    chlorhexidine (HIBICLENS) 4 % external liquid, Apply topically daily as needed., Disp: 120 mL, Rfl: 0   Cholecalciferol (VITAMIN D3 GUMMIES ADULT PO), Take 2 tablets by mouth daily., Disp: , Rfl:     Clindamycin-Benzoyl Per, Refr, gel, Use as directed up to twice a day, Disp: 45 g, Rfl: 3   Continuous Blood Gluc Receiver ([place]) [place], 1 each by Does not apply route as directed. Use with G7 sensor to check blood sugars, Disp: 1 each, Rfl: 1   Continuous Blood Gluc Sensor (DEXCOM G7 SENSOR) MISC, Inject 1 Device into the skin as directed. Change sensor every 10 days., Disp: 3 each, Rfl: 5   diclofenac Sodium (VOLTAREN) 1 % GEL, Apply 4 g topically 4 (four) times daily.,  Disp: 100 g, Rfl: 4   dicyclomine (BENTYL) 20 MG tablet, Take a half tablet 4 times daily before meals., Disp: 60 tablet, Rfl: 3   etonogestrel (NEXPLANON) 68 MG IMPL implant, 1 each (68 mg total) by Subdermal route once., Disp: 1 each, Rfl: 0   fluticasone (FLONASE) 50 MCG/ACT nasal spray, SHAKE LIQUID AND USE 2 SPRAYS IN EACH NOSTRIL DAILY, Disp: 48 g, Rfl: 1   ibuprofen (ADVIL) 200 MG tablet, Take 600 mg by mouth every 6 (six) hours as needed for moderate pain., Disp: , Rfl:    meloxicam (MOBIC) 15 MG tablet, Take 15 mg by mouth daily., Disp: , Rfl:    Menaquinone-7 (VITAMIN K2) 100 MCG CAPS, Take 100 mcg by mouth daily., Disp: , Rfl:    mesalamine (PENTASA) 250 MG CR capsule, Take 4 capsules (1,000 mg total) by mouth 3 (three) times daily., Disp: 360 capsule, Rfl: 5   Metamucil Fiber CHEW, Chew 1 tablet by mouth daily., Disp: , Rfl:    Na Sulfate-K Sulfate-Mg Sulf 17.5-3.13-1.6 GM/177ML SOLN, See admin instructions., Disp: , Rfl:    norethindrone (AYGESTIN) 5 MG tablet, Take 1 tablet (5 mg total) by mouth 2 (two) times daily as needed (bleeding). For spotting take 1 tab daily. If flow take 2 tabs daily. If heavy bleeding take 3 tabs daily. When bleeding has stopped you can stop the medication., Disp: 180 tablet, Rfl: 3   ondansetron (ZOFRAN-ODT) 4 MG disintegrating tablet, Take 1 tablet (4 mg total) by mouth every 8 (eight) hours as needed for nausea or vomiting., Disp: 30 tablet, Rfl: 3   Propylene Glycol (SYSTANE  BALANCE OP), Apply to eye., Disp: , Rfl:    spironolactone (ALDACTONE) 50 MG tablet, Take 50 mg by mouth daily., Disp: , Rfl:    tirzepatide (MOUNJARO) 12.5 MG/0.5ML Pen, Inject 12.5 mg into the skin once a week, Disp: 6 mL, Rfl: 1   triamcinolone cream (KENALOG) 0.1 %, Apply 1 Application topically 2 (two) times daily., Disp: , Rfl:    Zonisamide 100 MG/5ML SUSP, Take 79m every night, Disp: 300 mL, Rfl: 11  Allergies as of 02/18/2022   (No Known Allergies)     reports that she has never smoked. She has never been exposed to tobacco smoke. She has never used smokeless tobacco. She reports that she does not drink alcohol and does not use drugs. Pediatric History  Patient Parents   FStacia, Feazell(Mother)   FTharon AquasJr,Clifton (Father)   Other Topics Concern   Not on file  Social History Narrative   AKathrinais graduated. [name] doing average.    She enjoys playing softball and basketball.   Lives with her parents.    Not working.   Left handed   Two story home      Had surgery on her legs in Feb and is physical therapy twice a week.   Left handed   No caffeine   Unable to work. Now has disability.   Primary Care Provider: CHoyt Koch MD     Objective:  Objective   Vital Signs:       08/24/21 13:21  BP 112/74  Pulse Rate 80  Weight 217 lb  Height 5' 4.17" (1.63 m)  BMI (Calculated) 37.05    11/24/21 09:14  BP 122/78  Pulse Rate 80  Weight 205 lb 12.8 oz  Height 5' 4.29" (1.633 m)  BMI (Calculated) 35.01    BP 124/72 (BP Location: Left Arm, Patient Position: Sitting, Cuff Size: Large)  Pulse (!) 103   Wt 185 lb 12.8 oz (84.3 kg)   LMP 02/10/2022 (Exact Date)   BMI 31.89 kg/m  Growth %ile SmartLinks can only be used for patients less than 63 years old.    Ht Readings from Last 3 Encounters:  02/10/22 5' 4"  (1.626 m)  02/01/22 5' 4"  (1.626 m)  01/04/22 5' 4.27" (1.632 m)   Wt Readings from Last 3 Encounters:  02/18/22 185 lb 12.8 oz (84.3  kg)  02/10/22 187 lb (84.8 kg)  02/01/22 194 lb 4.8 oz (88.1 kg)   HC Readings from Last 3 Encounters:  07/30/21 26.85" (68.2 cm)   Body surface area is 1.95 meters squared. Facility age limit for growth %iles is 20 years. Facility age limit for growth %iles is 20 years.   PHYSICAL EXAM:     Constitutional: The patient appears healthy and well nourished. The patient's height and weight are advanced for age.  She has lost 20 pounds since last visit.  Head: The head is normocephalic. Face: The face appears normal. There are no obvious dysmorphic features. Eyes: The eyes appear to be normally formed and spaced. Gaze is conjugate. There is no obvious arcus or proptosis. Moisture appears normal. Ears: The ears are normally placed and appear externally normal. Mouth: The oropharynx and tongue appear normal. Dentition appears to be normal for age. Oral moisture is normal. Neck: The neck appears to be visibly normal. The thyroid gland is 13 grams in size. The consistency of the thyroid gland is normal. The thyroid gland is not tender to palpation. +2 acanthosis with thick scaling Lungs: no increased work of breathing Heart: regular pulses and peripheral perfusion Abdomen: The abdomen appears to be enlarged in size for the patient's age. There is no obvious hepatomegaly, splenomegaly, or other mass effect.  Arms: Muscle size and bulk are normal for age. Axillary acanthosis and hydradenitis (scarring) Hands: There is no obvious tremor. Palmar muscles are normal for age. Palmar skin is normal. Palmar moisture is also normal. Swelling noted into proximal phalanges  Legs: Muscles appear normal for age. No edema is present.  Neurologic: Strength is normal for age in both the upper and lower extremities. Muscle tone is normal. Sensation to touch is normal in both the legs and feet.   GYN/GU: normal female Skin: She has multiple scars and a few open lesions under her breasts. She has scarring but no  open lesions (one or two with white pus under the surface) in her axillae.   LAB DATA:    Lab Results  Component Value Date   HGBA1C 6.4 02/18/2022   HGBA1C 5.9 11/24/2021   HGBA1C 6.3 (H) 08/24/2021   HGBA1C 6.4 02/12/2021   HGBA1C 6.3 (A) 11/13/2020   HGBA1C 6.3 (A) 07/24/2020   HGBA1C 6.2 04/24/2020   HGBA1C 6.6 (A) 12/11/2019    Results for orders placed or performed in visit on 02/18/22  POCT glycosylated hemoglobin (Hb A1C)  Result Value Ref Range   Hemoglobin A1C     HbA1c POC (<> result, manual entry)     HbA1c, POC (prediabetic range) 6.4 5.7 - 6.4 %   HbA1c, POC (controlled diabetic range)    POCT Glucose (Device for Home Use)  Result Value Ref Range   Glucose Fasting, POC 90 70 - 99 mg/dL   POC Glucose          Assessment and Plan:  Assessment  ASSESSMENT:   Deanna Levine is a 21 y.o. AA female with history of  precocious puberty (which increases risk of J6GE, PCOS, Metabolic syndrome) who now meets criteria for type 2 diabetes   Type 2 diabetes / hypoglycemic unawareness? / nocturnal seizures/ Skin issues - Diagnosed in January 2021 - A1C has been stable and within target of <6.5% - Currently on Mounjaro 12.5 mg.weekly.  - Current diagnosis of nocturnal seizure- not currently wearing cgm. Concern for nocturnal hypoglycemia. - will try again with CGM  DUB  - She has a Nexplanon in place but she is having DUB with this - Reviewed  that GLP1 medications decrease efficacy of contraceptive hormone - Norethindrone for breakthrough bleeding has helped some- but even with this she is bleeding.  - she wants to have her Nexplanon removed   Disability - She now has permanent disability status.    PLAN:   1. Diagnostic: Orders Placed This Encounter  Procedures   POCT glycosylated hemoglobin (Hb A1C)   POCT Glucose (Device for Home Use)   COLLECTION CAPILLARY BLOOD SPECIMEN    2. Therapeutic: Continue Mounjaro 12.5 mg weekly  Dexcom G7 will hold off for now- would  like to put one on before any future overnight studies (EEG or Sleep Study).   3. Patient education: Lengthy discussion as above.  4. Follow-up: Return in about 3 months (around 05/20/2022).      Lelon Huh, MD  Level of Service: >30 minutes spent today reviewing the medical chart, counseling the patient/family, and documenting today's encounter.

## 2022-02-21 DIAGNOSIS — G4733 Obstructive sleep apnea (adult) (pediatric): Secondary | ICD-10-CM

## 2022-02-21 NOTE — Procedures (Signed)
     Patient Name: Deanna Levine, Deanna Levine Date: 02/10/2022 Gender: Female D.O.B: 07-11-2000 Age (years): 21 Referring Provider: Baird Lyons MD, ABSM Height (inches): 64 Interpreting Physician: Baird Lyons MD, ABSM Weight (lbs): 187 RPSGT: Carolin Coy BMI: 32 MRN: [id number] Neck Size: 14.50  CLINICAL INFORMATION Sleep Study Type: NPSG Indication for sleep study: Excessive Daytime Sleepiness, Morning Headaches, Obesity, Snoring Epworth Sleepiness Score: 12  SLEEP STUDY TECHNIQUE As per the AASM Manual for the Scoring of Sleep and Associated Events v2.3 (April 2016) with a hypopnea requiring 4% desaturations.  The channels recorded and monitored were frontal, central and occipital EEG, electrooculogram (EOG), submentalis EMG (chin), nasal and oral airflow, thoracic and abdominal wall motion, anterior tibialis EMG, snore microphone, electrocardiogram, and pulse oximetry.  MEDICATIONS Medications self-administered by patient taken the night of the study : none reported  SLEEP ARCHITECTURE The study was initiated at 10:33:33 PM and ended at 5:15:58 AM.  Sleep onset time was 23.8 minutes and the sleep efficiency was 56.9%. The total sleep time was 229 minutes.  Stage REM latency was 298.0 minutes.  The patient spent 15.5% of the night in stage N1 sleep, 69.7% in stage N2 sleep, 0.0% in stage N3 and 14.9% in REM.  Alpha intrusion was absent.  Supine sleep was 68.11%.  RESPIRATORY PARAMETERS The overall apnea/hypopnea index (AHI) was 1.6 per hour. There were 0 total apneas, including 0 obstructive, 0 central and 0 mixed apneas. There were 6 hypopneas and 16 RERAs.  The AHI during Stage REM sleep was 10.6 per hour.  AHI while supine was 2.3 per hour.  The mean oxygen saturation was 98.0%. The minimum SpO2 during sleep was 91.0%.  soft snoring was noted during this study.  CARDIAC DATA The 2 lead EKG demonstrated sinus rhythm. The mean heart rate was 91.4 beats per  minute. Other EKG findings include: None.  LEG MOVEMENT DATA The total PLMS were 0 with a resulting PLMS index of 0.0. Associated arousal with leg movement index was 0.0 .  IMPRESSIONS - No significant obstructive sleep apnea occurred during this study (AHI = 1.6/h). - The patient had minimal or no oxygen desaturation during the study (Min O2 = 91.0%) - The patient snored with soft snoring volume. - No cardiac abnormalities were noted during this study. - No abnormal EEG events noted. - Clinically significant periodic limb movements did not occur during sleep. No significant associated arousals.  DIAGNOSIS - Normal study  RECOMMENDATIONS - Manage for symptoms based on clinical judgment. - Sleep hygiene should be reviewed to assess factors that may improve sleep quality. - Weight management and regular exercise should be initiated or continued if appropriate.  [Electronically signed] 02/21/2022 03:34 PM  Baird Lyons MD, [place], American Board of Sleep Medicine NPI: [id number]                        [place], [place] [facility]  ELECTRONICALLY SIGNED ON:  02/21/2022, 3:32 PM [place] PH: [phone]   FX: [phone] [place]

## 2022-02-23 ENCOUNTER — Ambulatory Visit (INDEPENDENT_AMBULATORY_CARE_PROVIDER_SITE_OTHER): Payer: 59 | Admitting: Pediatric Endocrinology

## 2022-02-23 ENCOUNTER — Encounter: Payer: Self-pay | Admitting: Podiatry

## 2022-02-23 ENCOUNTER — Ambulatory Visit (INDEPENDENT_AMBULATORY_CARE_PROVIDER_SITE_OTHER): Payer: 59 | Admitting: Podiatry

## 2022-02-23 VITALS — BP 102/71 | HR 83

## 2022-02-23 DIAGNOSIS — M2170 Unequal limb length (acquired), unspecified site: Secondary | ICD-10-CM | POA: Diagnosis not present

## 2022-02-23 DIAGNOSIS — Q6689 Other  specified congenital deformities of feet: Secondary | ICD-10-CM

## 2022-02-23 DIAGNOSIS — M25571 Pain in right ankle and joints of right foot: Secondary | ICD-10-CM | POA: Diagnosis not present

## 2022-02-23 NOTE — Progress Notes (Signed)
  Subjective:  Patient ID: Deanna Levine, female    DOB: 2000-05-25,  MRN: [id number]  Chief Complaint  Patient presents with   Foot Pain    2 month follow up right foot, patient states she is still having pain, rate of pain 4 out of 70    21 y.o. female presents with the above complaint. History confirmed with patient.  She returns for follow-up today says the pain is doing a little bit better.  They received a new pair of shoes that fit the brace now, she has not really worn them very much.  Objective:  Physical Exam: warm, good capillary refill, no trophic changes or ulcerative lesions, normal DP and PT pulses, normal sensory exam, and no pain to palpation in sinus tarsi or PT tendons today bilaterally.  Assessment:   1. Coalition, calcaneal tarsal   2. Sinus tarsitis, right   3. Acquired unequal limb length      Plan:  Patient was evaluated and treated and all questions answered.  Overall seems to have improved at this point.  I recommend they begin wearing the brace and shoes and use the next week as a break-in period to get used to them.  I gave them a prescription to add the three-quarter inch lift on the right shoe that she has on her current shoes to alleviate this discrepancy as well.  They will return as needed.  I am hopeful this will be a good long-term solution and avoiding further surgical intervention such as subtalar arthrodesis  Return if symptoms worsen or fail to improve.

## 2022-02-26 ENCOUNTER — Encounter: Payer: Self-pay | Admitting: Internal Medicine

## 2022-02-26 NOTE — Telephone Encounter (Signed)
Sleep study was normal. The breathing pattern and oxygen level were within normal limits.There was mild snoring. We do not need to start CPAP.

## 2022-03-03 ENCOUNTER — Other Ambulatory Visit: Payer: Self-pay | Admitting: Pulmonary Disease

## 2022-03-03 DIAGNOSIS — J301 Allergic rhinitis due to pollen: Secondary | ICD-10-CM

## 2022-03-04 NOTE — Progress Notes (Signed)
Reviewed and agree with management plans. ? ?Marylynne Keelin L. Fields Oros, MD, MPH  ?

## 2022-03-05 ENCOUNTER — Ambulatory Visit (INDEPENDENT_AMBULATORY_CARE_PROVIDER_SITE_OTHER): Payer: 59 | Admitting: Behavioral Health

## 2022-03-05 DIAGNOSIS — F419 Anxiety disorder, unspecified: Secondary | ICD-10-CM

## 2022-03-05 NOTE — Progress Notes (Addendum)
[place] Counselor/Therapist Progress Note  Patient ID: Deanna Levine, MRN: [id number],    Date: 03/05/2022  Time Spent: 72 min Caregility video; Pt is in room @ home in private & Provider working remote from [employer]   Treatment Type: Individual Therapy  Reported Symptoms: Reduction in anx/dep due to health concerns & positive Sleep Study results.  Mental Status Exam: Appearance:  Casual     Behavior: Appropriate and Sharing  Motor: Normal  Speech/Language:  Clear and Coherent  Affect: Appropriate  Mood: normal  Thought process: normal  Thought content:   WNL  Sensory/Perceptual disturbances:   WNL  Orientation: oriented to person, place, and time/date  Attention: Good  Concentration: Good  Memory: WNL  Fund of knowledge:  Good  Insight:   Good  Judgment:  Good  Impulse Control: Good   Risk Assessment: Danger to Self:  No Self-injurious Behavior: No Danger to Others: No Duty to Warn:no Physical Aggression / Violence:No  Access to Firearms a concern: No  Gang Involvement:No   Subjective: Pt is positive about the New Year. Her Sleep Study went well. She does not need a CPAP machine. [name] exp'g many headaches since the [place] has started & Mother wants a Neurology appt to get an MRI. [name] not nervous about this procedure.   Overall, her Holidays have been good.   Interventions: Solution-Oriented/Positive Psychology  Diagnosis:Anxiety  Plan: Deanna Levine is looking forward to eating healthy & exercising. She wants to lose some weight caused by medicines.   She will be starting PT again for her feet, having her Nexplanon implant in her arm removed, & using her new shoes in her braces regularly. She rec'd a Water Pik for Christmas & loves this floss technique.   [name] in the process of moving & using her new shoes. Her feet get sore & she has to rest.   Her Theodoro Kos is having a Fast next week until Jan 14th for prayer & new growth. Her Theodoro Kos is [facility].   Deanna Levine will cont to care for herself & report back on her progress w/our revised goals next session.  Target Date: 05/04/2022  Progress: 3  Frequency: Twice monthly or as schedule allows  Modality: [name], LMFT

## 2022-03-05 NOTE — Progress Notes (Signed)
                Carrolyn Hilmes L Rosemond Lyttle, LMFT 

## 2022-03-06 ENCOUNTER — Encounter: Payer: Self-pay | Admitting: Neurology

## 2022-03-10 ENCOUNTER — Encounter: Payer: Self-pay | Admitting: Family

## 2022-03-10 ENCOUNTER — Ambulatory Visit (INDEPENDENT_AMBULATORY_CARE_PROVIDER_SITE_OTHER): Payer: 59 | Admitting: Family

## 2022-03-10 VITALS — BP 107/71 | HR 93 | Ht 64.27 in | Wt 186.4 lb

## 2022-03-10 DIAGNOSIS — Q8789 Other specified congenital malformation syndromes, not elsewhere classified: Secondary | ICD-10-CM

## 2022-03-10 DIAGNOSIS — N920 Excessive and frequent menstruation with regular cycle: Secondary | ICD-10-CM

## 2022-03-10 NOTE — Progress Notes (Signed)
History was provided by the patient and mom.   Deanna Levine is a 22 y.o. female who is here for breakthrough bleeding on Nexplanon .   PCP confirmed? Yes.    [name], MD  Plan from last visit:  1. Breakthrough bleeding on Nexplanon   -discussed option for sedated IUD; mom sees Dr Garwin Brothers; will send referral for option for GYN/OB sedated IUD  -trial Aygestin 10 mg at bedtime for bleeding suppression  -no family history of thyroid disease; discussed with mom and agreed to not screen for thyroid etiology at this time, bleeding consistent with implant use side effect -hemoglobin stable at 11.1 POCT today; continue with iron supplement and DMV - POCT hemoglobin     HPI:    -hasn't had period yet this month  -last period was 2 weeks and it was heavy - Dec 15 through about Christmas  -taking Aygestin 10 mg (two 5 mg); dose that mom used -will need referral for Medicaid OB/GYN for sedated IUD  -having increased headaches; has reached to Neuro and plans to reply to my chart today  -not sexually active      Patient Active Problem List   Diagnosis Date Noted   Dysphagia    Abdominal pain    OSA (obstructive sleep apnea) 09/30/2021   Bilateral hand numbness 09/15/2021   Acne vulgaris 08/26/2021   Noninfectious gastroenteritis    Bilateral arm pain 06/12/2021   DUB (dysfunctional uterine bleeding) 05/14/2021   Diarrhea 04/24/2021   Dysuria 04/24/2021   Sinusitis 04/24/2021   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   Other adverse food reactions, not elsewhere classified, subsequent encounter 08/20/2020   Other allergic rhinitis 08/20/2020   Allergic conjunctivitis of both eyes 08/20/2020   Encounter for general adult medical examination with abnormal findings 07/03/2020   Leg length discrepancy 06/05/2020   Hidradenitis suppurativa 06/05/2020   Intellectual disability 06/05/2020   Achilles tendon contracture, bilateral    Elevated sedimentation rate 02/07/2020   Pain  in right ankle and joints of right foot 02/07/2020   Anemia 12/29/2018   Type 2 diabetes mellitus ([place]) 08/15/2017   Elevated triglycerides with high cholesterol 12/09/2016   Mild intellectual disability 08/16/2012   Laxity of ligament 08/16/2012   Delayed milestones 08/16/2012   Obesity 06/07/2011   Dyspepsia 02/11/2011   Acanthosis nigricans 02/11/2011   Global developmental delay    Petit mal without grand mal seizures ([place])    Dyspraxia     Current Outpatient Medications on File Prior to Visit  Medication Sig Dispense Refill   acetaminophen (TYLENOL) 160 MG/5ML elixir Take 960 mg by mouth every 4 (four) hours as needed (headache).     azelastine (ASTELIN) 0.1 % nasal spray Place 1 spray into both nostrils 2 (two) times daily. Use in each nostril as directed 30 mL 12   cetirizine (ZYRTEC) 10 MG tablet Take 10 mg by mouth daily as needed for allergies.     chlorhexidine (HIBICLENS) 4 % external liquid Apply topically daily as needed. 120 mL 0   Cholecalciferol (VITAMIN D3 GUMMIES ADULT PO) Take 2 tablets by mouth daily.     Clindamycin-Benzoyl Per, Refr, gel Use as directed up to twice a day 45 g 3   Continuous Blood Gluc Receiver ([place]) [place] 1 each by Does not apply route as directed. Use with G7 sensor to check blood sugars 1 each 1   Continuous Blood Gluc Sensor (DEXCOM G7 SENSOR) MISC Inject 1 Device into the skin as directed. Change  sensor every 10 days. 3 each 5   diclofenac Sodium (VOLTAREN) 1 % GEL Apply 4 g topically 4 (four) times daily. 100 g 4   dicyclomine (BENTYL) 20 MG tablet Take a half tablet 4 times daily before meals. 60 tablet 3   etonogestrel (NEXPLANON) 68 MG IMPL implant 1 each (68 mg total) by Subdermal route once. 1 each 0   ibuprofen (ADVIL) 200 MG tablet Take 600 mg by mouth every 6 (six) hours as needed for moderate pain.     mesalamine (PENTASA) 250 MG CR capsule Take 4 capsules (1,000 mg total) by mouth 3 (three) times daily. 360 capsule 5    Metamucil Fiber CHEW Chew 1 tablet by mouth daily.     Na Sulfate-K Sulfate-Mg Sulf 17.5-3.13-1.6 GM/177ML SOLN See admin instructions.     norethindrone (AYGESTIN) 5 MG tablet Take 1 tablet (5 mg total) by mouth 2 (two) times daily as needed (bleeding). For spotting take 1 tab daily. If flow take 2 tabs daily. If heavy bleeding take 3 tabs daily. When bleeding has stopped you can stop the medication. 180 tablet 3   ondansetron (ZOFRAN-ODT) 4 MG disintegrating tablet Take 1 tablet (4 mg total) by mouth every 8 (eight) hours as needed for nausea or vomiting. 30 tablet 3   Propylene Glycol (SYSTANE BALANCE OP) Apply to eye.     spironolactone (ALDACTONE) 50 MG tablet Take 50 mg by mouth daily.     tirzepatide (MOUNJARO) 12.5 MG/0.5ML Pen Inject 12.5 mg into the skin once a week 6 mL 1   triamcinolone cream (KENALOG) 0.1 % Apply 1 Application topically 2 (two) times daily.     Zonisamide 100 MG/5ML SUSP Take 50mL every night 300 mL 11   fluticasone (FLONASE) 50 MCG/ACT nasal spray SHAKE LIQUID AND USE 2 SPRAYS IN EACH NOSTRIL DAILY 48 g 1   meloxicam (MOBIC) 15 MG tablet Take 15 mg by mouth daily.     Menaquinone-7 (VITAMIN K2) 100 MCG CAPS Take 100 mcg by mouth daily.     No current facility-administered medications on file prior to visit.    No Known Allergies  Physical Exam:    Vitals:   03/10/22 1100  BP: 107/71  Pulse: 93  Weight: 186 lb 6.4 oz (84.6 kg)  Height: 5' 4.27" (1.632 m)    Growth %ile SmartLinks can only be used for patients less than 41 years old. Patient's last menstrual period was 02/10/2022 (exact date).  Physical Exam Constitutional:      General: [name] not in acute distress.    Appearance: [name] well-developed.  HENT:     Head: Normocephalic and atraumatic.     Comments: Wearing corrective lenses Eyes:     General: No scleral icterus.    Pupils: Pupils are equal, round, and reactive to light.  Neck:     Thyroid: No thyromegaly.  Cardiovascular:      Rate and Rhythm: Normal rate and regular rhythm.     Heart sounds: Normal heart sounds. No murmur heard. Pulmonary:     Effort: Pulmonary effort is normal.     Breath sounds: Normal breath sounds.  Musculoskeletal:        General: Normal range of motion.     Cervical back: Normal range of motion and neck supple.  Lymphadenopathy:     Cervical: No cervical adenopathy.  Skin:    General: Skin is warm and dry.     Findings: No rash.  Neurological:  Mental Status: [name] alert and oriented to person, place, and time.     Cranial Nerves: No cranial nerve deficit.  Psychiatric:        Behavior: Behavior normal.        Thought Content: Thought content normal.        Judgment: Judgment normal.      Assessment/Plan: 1. Menorrhagia with regular cycle 2. White-Sutton syndrome -continue with Aygestin 5 mg for menstrual suppression  -referral to OB/GYN for sedated IUD per request -return precautions reviewed; 3 months follow-up or sooner as needed  - Ambulatory referral to Obstetrics / Gynecology

## 2022-03-10 NOTE — Patient Instructions (Signed)
Follow-up  in 1 month. Schedule this appointment before you leave clinic today.  Your Nexplanon was removed today and is no longer preventing pregnancy.  If you have sex, remember to use condoms to prevent pregnancy and to prevent sexually transmitted infections.  Leave the outside bandage on for 24 hours.  Leave the smaller bandages on for 3-5 days or until they fall off on their own.  Keep the area clean and dry for 3-5 days.  There is usually bruising or swelling at and around the removal site for a few days to a week after the removal.  If you see redness or pus draining from the removal site, call us immediately.  We would like you to return to the clinic for a follow-up visit in 1 month.  You can call [facility] 24 hours a day with any questions or concerns.  There is always a nurse or doctor available to take your call.  Call 9-1-1 if you have a life-threatening emergency.  For anything else, please call us at [phone] before heading to the ER.  

## 2022-03-12 ENCOUNTER — Encounter: Payer: Self-pay | Admitting: Family

## 2022-03-25 ENCOUNTER — Ambulatory Visit (INDEPENDENT_AMBULATORY_CARE_PROVIDER_SITE_OTHER): Payer: 59 | Admitting: Behavioral Health

## 2022-03-25 DIAGNOSIS — F418 Other specified anxiety disorders: Secondary | ICD-10-CM

## 2022-03-25 DIAGNOSIS — F419 Anxiety disorder, unspecified: Secondary | ICD-10-CM

## 2022-03-25 NOTE — Progress Notes (Signed)
                Masson Nalepa L Dinesha Twiggs, LMFT 

## 2022-03-25 NOTE — Progress Notes (Addendum)
[place] Counselor/Therapist Progress Note  Patient ID: Deanna Levine, MRN: [id number],    Date: 03/25/2022  Time Spent: 99 min Caregility video; Pt is in her Parents' car for privacy & Provider is working remote @ [employer]   Treatment Type: Individual Therapy  Reported Symptoms: Pt is inc'ly dealing w/headaches daily, she is trying to help her Parents @ home  Mental Status Exam: Appearance:  Casual     Behavior: Appropriate and Sharing  Motor: Normal  Speech/Language:  Clear and Coherent and Normal Rate  Affect: Appropriate  Mood: normal  Thought process: normal  Thought content:   WNL  Sensory/Perceptual disturbances:   WNL  Orientation: oriented to person, place, and time/date  Attention: Good  Concentration: Good  Memory: WNL  Fund of knowledge:  Good  Insight:   Poor  Judgment:  Fair  Impulse Control: Fair   Risk Assessment: Danger to Self:  No Self-injurious Behavior: No Danger to Others: No Duty to Warn:no Physical Aggression / Violence:No  Access to Firearms a concern: No  Gang Involvement:No   Subjective: Pt is listening to sleep music & it calms helps her. [name] getting better sleep. Her meditation practice is envisioning the [facility] & being on the water. [name] watching Charity fundraiser w/her Dad when he returns @ 11:30pm at night. [name] able to microwave food for meals.   Pt has AFO's now & wears them daily. They help her walk better. She wears them for 4 hrs & takes them off. She wants the shoelaces to be changed out to velcroe to improve her comfort.  Interventions: Family Systems  Diagnosis:Anxiety  Anxiety about health  Plan: Deanna Levine will cont to monitor her headaches to report to the Neurologist. [name] tending to her health needs & feeling improvements.  Target Date: 04/25/2022  Progress: 6  Frequency: Twice monthly   Modality: Indiv & requested Family presence w/Mother for next visit  Donnetta Hutching, LMFT

## 2022-03-30 ENCOUNTER — Ambulatory Visit (INDEPENDENT_AMBULATORY_CARE_PROVIDER_SITE_OTHER): Payer: 59 | Admitting: Pediatric Endocrinology

## 2022-03-31 ENCOUNTER — Encounter: Payer: Self-pay | Admitting: Podiatry

## 2022-03-31 ENCOUNTER — Ambulatory Visit (INDEPENDENT_AMBULATORY_CARE_PROVIDER_SITE_OTHER): Payer: 59 | Admitting: Podiatry

## 2022-03-31 VITALS — BP 102/71

## 2022-03-31 DIAGNOSIS — R234 Changes in skin texture: Secondary | ICD-10-CM

## 2022-03-31 DIAGNOSIS — E119 Type 2 diabetes mellitus without complications: Secondary | ICD-10-CM | POA: Diagnosis not present

## 2022-03-31 DIAGNOSIS — M79675 Pain in left toe(s): Secondary | ICD-10-CM

## 2022-03-31 DIAGNOSIS — M2141 Flat foot [pes planus] (acquired), right foot: Secondary | ICD-10-CM | POA: Diagnosis not present

## 2022-03-31 DIAGNOSIS — B351 Tinea unguium: Secondary | ICD-10-CM

## 2022-03-31 DIAGNOSIS — M2142 Flat foot [pes planus] (acquired), left foot: Secondary | ICD-10-CM | POA: Diagnosis not present

## 2022-03-31 DIAGNOSIS — M79674 Pain in right toe(s): Secondary | ICD-10-CM | POA: Diagnosis not present

## 2022-03-31 MED ORDER — TRIAMCINOLONE ACETONIDE 0.1 % EX CREA: TOPICAL_CREAM | CUTANEOUS | 0 refills | Status: AC

## 2022-03-31 NOTE — Patient Instructions (Signed)
WEEKLY FOOT SOAK INSTRUCTIONS FOR FOOT HYGIENE   SOAK FEET IN [place] SOAPY WATER FOR 10 MINUTES. HAVE A FAMILY MEMBER OR CAREGIVER CHECK THE WATER TEMPERATURE FOR YOU BEFORE SUBMERGING YOUR FEET IN THE WATER.  2.  DRY FEET WELL TAKING CARE TO DRY WELL BETWEEN TOES AND UNDER TOES.  3.  APPLY MOISTURIZING CREAM TO FEET AVOIDING APPLICATION BETWEEN TOES.   For normal skin: Moisturize feet once daily; do not apply between toes A.  CeraVe Daily Moisturizing Lotion B.  Vaseline Intensive Care Lotion C.  Lubriderm Lotion D.  Gold Bond Diabetic Foot Lotion E.  Eucerin Intensive Repair Moisturizing Lotion  For extremely dry, cracked feet: moisturize feet once daily; do not apply between toes A. CeraVe Healing Ointment B. Aquaphor Healing Ointment C. Vaseline Petroleum Healing Jelly   If you have problems reaching your feet: apply to feet once daily; do not apply between toes A.  Aquaphor Advanced Therapy Ointment Body Spray B.  Vaseline Intensive Care Spray Lotion Advanced Repair

## 2022-04-02 NOTE — Progress Notes (Signed)
  Subjective:  Patient ID: Deanna Levine, female    DOB: 03/29/2000,  MRN: [id number]  Deanna Levine presents to clinic today for for annual diabetic foot examination and painful thick toenails that are difficult to trim. Pain interferes with ambulation. Aggravating factors include wearing enclosed shoe gear. Pain is relieved with periodic professional debridement.  Chief Complaint  Patient presents with   Diabetes    Diabetic foot care, A1c- 6.2BG-142(yesterday), PCP last seen in oct 2023   New problem(s): None.   She would like to discuss having velcro placed on her braces as well as rough skin on her heels.  PCP is [name], MD.  No Known Allergies  Review of Systems: Negative except as noted in the HPI.  Objective:  Vitals:   03/31/22 0930  BP: 102/71   Deanna Levine is a pleasant 22 y.o. female obese in NAD. AAO x 3.  Vascular Examination: CFT immediate b/l LE. Palpable DP/PT pulses b/l LE. Digital hair present b/l. Skin temperature gradient WNL b/l. No pain with calf compression b/l. No edema noted b/l. No cyanosis or clubbing noted b/l LE.  Dermatological Examination: Pedal integument with normal turgor, texture and tone BLE. No open wounds b/l LE.   Toenails bilateral great toes elongated, discolored, dystrophic, thickened, and crumbly with subungual debris and tenderness to dorsal palpation.   Rough pedal skin noted b/l heels. No erythema, no edema, no drainage.  Pedal skin noted to exhibit signs of poor pedal hygiene with noted foot odor b/l and interdigital debris. No open wounds noted.  No hyperkeratotic nor porokeratotic lesions present on today's visit.  Well healed surgical scars b/l feet.  Musculoskeletal Examination: Muscle strength 5/5 to all lower extremity muscle groups bilaterally. Pes planus deformity noted bilateral LE.  Neurological Examination: Protective sensation intact 5/5 intact bilaterally with 10g monofilament b/l.  Vibratory sensation intact b/l.  Assessment/Plan: 1. Pain due to onychomycosis of toenails of both feet   2. Pes planus of both feet   3. Roughened skin texture   4. Type 2 diabetes mellitus without complication, without long-term current use of insulin ([place])   5. Encounter for diabetic foot exam ([place])     Meds ordered this encounter  Medications   triamcinolone cream (KENALOG) 0.1 %    Sig: Apply to rough area of feet twice daily.    Dispense:  454 g    Refill:  0    -Patient's family member present. All questions/concerns addressed on today's visit. -Discussed pedal hygiene. Recommended hygiene soaks. Written instructions dispensed. -For rough skin on heels, Rx sent for triamcinolone cream to be applied twice daily. -Regarding her braces, I do not feel they can be modified from lace to velcro. She and her father related understanding. -Diabetic foot examination performed today. -Continue diabetic foot care principles: inspect feet daily, monitor glucose as recommended by PCP and/or Endocrinologist, and follow prescribed diet per PCP, Endocrinologist and/or dietician. -Continue supportive shoe gear daily. -Toenails bilateral great toes debrided in length and girth without iatrogenic bleeding with sterile nail nipper and dremel.  -Patient/POA to call should there be question/concern in the interim.   Return in about 3 months (around 06/29/2022).  Marzetta Board, DPM

## 2022-04-12 ENCOUNTER — Encounter: Payer: Self-pay | Admitting: Neurology

## 2022-04-12 ENCOUNTER — Telehealth: Payer: Self-pay

## 2022-04-12 MED ORDER — ZONISAMIDE 100 MG/5ML PO SUSP: ORAL | 11 refills | Status: DC

## 2022-04-12 NOTE — Telephone Encounter (Signed)
Sent message to RX team.

## 2022-04-12 NOTE — Telephone Encounter (Signed)
Pt's mother called stating pt is out of medication. She needs a refill on her Zonisamide 100 mg/ 5 ml susp. States pharmacy needs a PA for this medication. States she needs her medication as soon as possible.

## 2022-04-12 NOTE — Telephone Encounter (Signed)
Note sent to RX team.

## 2022-04-13 ENCOUNTER — Telehealth: Payer: Self-pay | Admitting: Pharmacy Technician

## 2022-04-13 ENCOUNTER — Other Ambulatory Visit (HOSPITAL_COMMUNITY): Payer: Self-pay

## 2022-04-13 ENCOUNTER — Telehealth: Payer: Self-pay

## 2022-04-13 MED ORDER — ZONISAMIDE 100 MG PO CAPS: ORAL_CAPSULE | ORAL | 3 refills | Status: DC

## 2022-04-13 NOTE — Telephone Encounter (Signed)
Message sent to the PA team

## 2022-04-13 NOTE — Telephone Encounter (Signed)
Pt has current PA on file that is active until 6.22.24. Called pharmacy, who additionally called the pharmacy help desk and was told they could not put an override in the system. With the insurance information with cared scanned in in January, test billing results show that it is a refill too soon. Pt last filled 1.25.24, and next earliest fill is on 2.17.24. PA was submitted again initially to ensure quantity is correct of 367m.

## 2022-04-13 NOTE — Telephone Encounter (Signed)
Pt needs an expedited PA she is out of medication ,  Zonisamide 100 mg/ 5 ml susp.

## 2022-04-13 NOTE — Telephone Encounter (Signed)
Pt mom called she is ok with taken the capsules. The pharmacy stated that the capsules will go through even though the liquid will not,

## 2022-04-13 NOTE — Telephone Encounter (Signed)
Patient Advocate Encounter  Received notification from [employer] that prior authorization for ZONISAMIDE 100MG/5ML is required.   PA submitted on 2.13.24 EXPEDITED Key [id number] Status is pending

## 2022-04-13 NOTE — Telephone Encounter (Signed)
PA determination returned with same message as before. However if patient continue with liquid, may need to transition to mail order fill. Retail fill may be exceeded as well.

## 2022-04-15 ENCOUNTER — Ambulatory Visit (INDEPENDENT_AMBULATORY_CARE_PROVIDER_SITE_OTHER): Payer: 59 | Admitting: Behavioral Health

## 2022-04-15 DIAGNOSIS — F418 Other specified anxiety disorders: Secondary | ICD-10-CM

## 2022-04-15 NOTE — Progress Notes (Signed)
[place] Counselor/Therapist Progress Note  Patient ID: Deanna Levine, MRN: [id number],    Date: 04/15/2022  Time Spent: 69 min Caregility video; Pt is in her Parent's car for privacy & Provider working remotely from [employer]   Treatment Type: Individual Therapy  Reported Symptoms: Stressful exp getting her Sz medication  Mental Status Exam: Appearance:  Casual     Behavior: Appropriate and Sharing  Motor: Normal  Speech/Language:  Clear and Coherent  Affect: Appropriate  Mood: anxious  Thought process: normal  Thought content:   WNL  Sensory/Perceptual disturbances:   WNL  Orientation: oriented to person, place, and time/date  Attention: Good  Concentration: Good  Memory: WNL  Fund of knowledge:  Fair  Insight:   Fair  Judgment:  Good  Impulse Control: Good   Risk Assessment: Danger to Self:  No Self-injurious Behavior: No Danger to Others: No Duty to Warn:no Physical Aggression / Violence:No  Access to Firearms a concern: No  Gang Involvement:No   Subjective: Pt is stressed today due to arguing w/her Parents about chores. She will let it go & use her stress ball to reduce her anxiety. [name] excited for her upcoming Bday when she will go on her BF Deanna Levine to the musical, 'The Color Purple'.  [name] working w/her Parents on the [employer] picks up jobs for [employer] when possible.  Interventions: Solution-Oriented/Positive Psychology  Diagnosis:Anxiety about health  Plan: Deanna Levine will keep her notes recorded in her Notebook. 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   Target Date: 05/14/2022  Progress: 3  Frequency: Twice monthly  Modality: Deanna Reaper, LMFT

## 2022-04-15 NOTE — Progress Notes (Signed)
                Jett Kulzer L Grayling Schranz, LMFT 

## 2022-04-23 ENCOUNTER — Encounter (INDEPENDENT_AMBULATORY_CARE_PROVIDER_SITE_OTHER): Payer: Self-pay | Admitting: Pharmacist

## 2022-04-26 ENCOUNTER — Telehealth (INDEPENDENT_AMBULATORY_CARE_PROVIDER_SITE_OTHER): Payer: Self-pay | Admitting: Pediatric Endocrinology

## 2022-04-26 ENCOUNTER — Encounter: Payer: Self-pay | Admitting: Neurology

## 2022-04-26 ENCOUNTER — Other Ambulatory Visit: Payer: Self-pay

## 2022-04-26 ENCOUNTER — Ambulatory Visit (INDEPENDENT_AMBULATORY_CARE_PROVIDER_SITE_OTHER): Payer: 59 | Admitting: Neurology

## 2022-04-26 VITALS — BP 99/66 | HR 78 | Ht 64.0 in | Wt 184.9 lb

## 2022-04-26 DIAGNOSIS — G43009 Migraine without aura, not intractable, without status migrainosus: Secondary | ICD-10-CM | POA: Diagnosis not present

## 2022-04-26 DIAGNOSIS — R4189 Other symptoms and signs involving cognitive functions and awareness: Secondary | ICD-10-CM

## 2022-04-26 DIAGNOSIS — G40409 Other generalized epilepsy and epileptic syndromes, not intractable, without status epilepticus: Secondary | ICD-10-CM

## 2022-04-26 DIAGNOSIS — R519 Headache, unspecified: Secondary | ICD-10-CM

## 2022-04-26 MED ORDER — TOPIRAMATE 25 MG/ML PO SOLN: ORAL | 6 refills | Status: DC

## 2022-04-26 MED ORDER — MOUNJARO 12.5 MG/0.5ML ~~LOC~~ SOAJ
12.5000 mg | SUBCUTANEOUS | 2 refills | Status: DC
  Filled 2022-04-26 – 2022-04-29 (×5): qty 2, 28d supply, fill #0

## 2022-04-26 NOTE — Telephone Encounter (Signed)
Who's calling (name and relationship to patient) : Arren Dunavan and mom   Best contact number: [phone]  Provider they see: Dr. Alesia Richards taylor  Reason for call: [name] and Mom called in stating that they are at the  pharmacy downstairs ([facility]) and they are needing a P.A. for Mounjaro. She called in to see if [facility] or Dr. [name] could handle the P.A so that Melandie can get it. Mom also stated that its 1st come 1st serve.   Call ID:      PRESCRIPTION REFILL ONLY  Name of prescription:  Pharmacy:

## 2022-04-26 NOTE — Progress Notes (Signed)
NEUROLOGY FOLLOW UP OFFICE NOTE  Deanna Levine [id number] [birth year]  HISTORY OF PRESENT ILLNESS: I had the pleasure of seeing Deanna Levine in follow-up in the neurology clinic on 04/26/2022.  The patient was last seen 4 months ago for primary generalized epilepsy and headaches. [name] again accompanied by her parents who help supplement the history today.  Records and images were personally reviewed where available. Since her last visit, she had contacted our office last month to report an increase in headaches despite increasing dose of Zonisamide on last visit. They report she has headaches on a daily basis, taking Tylenol once a day. Sometimes it helps. In the past month, she has taken 2-3 Zofran doses for nausea. She would read for a while then start complaining of a headache. She denies any visual obscurations, photo/phonophobia, dizziness, focal numbness/tingling/weakness. Her mother has noticed quite a bit of staring. They would ask her a question and it takes a while before she gets it. It takes a few minutes to process information, she blinks/does something with her eyes, then later on asks her mother what the conversation was about. [name] on Zonisamide '200mg'$  qhs. She has difficulty swallowing pills, however liquid formulation was not available so she sprinkles the capsules into applesauce. She feels that the headaches come on more after she takes her medication at 6pm. She seems to have more headaches in the evening, but also has them in the daytime from time to time. No drowsiness on the medication. Her mother wonders if it is causing behavioral issues, she would do "mischievous little stuff/acting out" like pulling the cover off her mother or hitting her, which she denies doing. She reports 3 hours of sleep a night, she tosses and turns, then sleeps from 5am to 12nn. She may take naps in the middle of the day. She has had 2 normal sleep studies. She has lost weight with Mounjaro. They had  the birth control implant taken out as she continued to have breakthrough bleeding. Her mother reports a lot of screen time, spending 3-4 hours a day and always on her phone, which she denies. She does not want to leave her room, reporting anxiety. She has started seeing a therapist.    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 She was seen by [name] in August 2021 for concern for seizures. She was previously followed by pediatric neurologist Dr. Gaynell Face, notes were reviewed. Seizures began at age 5 with multiple absence seizures daily where she would drop her hand to her side and begin drooling, unresponsive for around 2 minutes followed by sleepiness. No convulsive seizures. At that time, MRI brain and initial EEG were normal. She was treated with carbamazepine with no further seizures after 2010, however repeat EEG in 2012 reported diphasic sharply contoured slow wave activity seen bilaterally synchronously in the central, temporal, and parietal region so she was continued on medication. She had another EEG in 2015 which was normal and she was tapered off carbamazepine. Of note, on her 2017 clinic visit, her mother reported that she stares sometimes but it seems to be daydreaming or not paying attention rather than seizure activity because she would respond when  called. She presented to [name] in 2021 with her mother reporting very brief staring spells lasting a few seconds, different from her typical seizures because they were much shorter and would not recur. They appeared as if she was daydreaming or not paying attention, worse with  stress. [name] accompanied today by her father who has not seen the staring episodes but states that she does daydream and sometimes would not respond, saying she does not hear them calling her. She states she feels good. She denies any gaps in time, olfactory/gustatory hallucinations, focal numbness/tingling, myoclonic jerks. No headaches, dizziness, vision changes, no falls. She has been having orthopedic difficulties in her feet and hands and was ultimately diagnosed with WSS a few months ago. She was previously working but has been taken out of work because she is unable to stand for prolonged periods and it affects her muscle skills. She manages her own medications, her father reports she is pretty good with this. Sleep is good, she sleeps 9-10 hours. She states her mood is okay, her father notes there has been some stress recently.   Epilepsy Risk Factors:  White-Sutton Syndrome. She had global developmental delay since infancy. There is no history of febrile convulsions, CNS infections such as meningitis/encephalitis, significant traumatic brain injury, neurosurgical procedures, or family history of seizures.  Prior AEDs: carbamazepine  Laboratory Data: EEGs: EEG 2012 reported diphasic sharply contoured slow wave activity seen bilaterally synchronously in the central, temporal, and parietal region EEG normal 2015 and 2018 48-hour EEG 04/2021 abnormal with rare generalized 4 Hz spike and wave discharges with frontal predominance seen in sleep.  MRI brain in 2003 unremarkable.   PAST MEDICAL HISTORY: Past Medical History:  Diagnosis Date   Absence seizure disorder ([place])    Allergy    Colitis [id number]   Complication of anesthesia    slow to wake up from anesthesia, occ HA with anesthesia    Diabetes mellitus without complication (HCC)    type 2   Difficulty swallowing pills    Dyspraxia    Eczema    Global developmental delay    Obesity (BMI 30-39.9)    PONV (postoperative nausea  and vomiting)    Precocious female puberty    Seizures ([place])    last seizure 2012 - no current med   Sleep apnea    White-Sutton syndrome     MEDICATIONS: Current Outpatient Medications on File Prior to Visit  Medication Sig Dispense Refill   acetaminophen (TYLENOL) 160 MG/5ML elixir Take 960 mg by mouth every 4 (four) hours as needed (headache).     azelastine (ASTELIN) 0.1 % nasal spray Place 1 spray into both nostrils 2 (two) times daily. Use in each nostril as directed 30 mL 12   cetirizine (ZYRTEC) 10 MG tablet Take 10 mg by mouth daily as needed for allergies.     chlorhexidine (HIBICLENS) 4 % external liquid Apply topically daily as needed. 120 mL 0   Cholecalciferol (VITAMIN D3 GUMMIES ADULT PO) Take 2 tablets by mouth daily.     Clindamycin-Benzoyl Per, Refr, gel Use as directed up to twice a day 45 g 3   diclofenac Sodium (VOLTAREN) 1 % GEL Apply 4 g topically 4 (four) times daily. 100 g 4   dicyclomine (BENTYL) 20 MG tablet Take a half tablet 4 times daily before meals. 60 tablet 3   fluticasone (FLONASE) 50 MCG/ACT nasal spray SHAKE LIQUID AND USE 2 SPRAYS IN EACH NOSTRIL DAILY 48 g 1  ibuprofen (ADVIL) 200 MG tablet Take 600 mg by mouth every 6 (six) hours as needed for moderate pain.     meloxicam (MOBIC) 15 MG tablet Take 15 mg by mouth daily.     Menaquinone-7 (VITAMIN K2) 100 MCG CAPS Take 100 mcg by mouth daily.     ondansetron (ZOFRAN-ODT) 4 MG disintegrating tablet Take 1 tablet (4 mg total) by mouth every 8 (eight) hours as needed for nausea or vomiting. 30 tablet 3   Propylene Glycol (SYSTANE BALANCE OP) Apply to eye.     spironolactone (ALDACTONE) 50 MG tablet Take 50 mg by mouth daily.     tirzepatide (MOUNJARO) 12.5 MG/0.5ML Pen Inject 12.5 mg into the skin once a week 6 mL 1   triamcinolone cream (KENALOG) 0.1 % Apply to rough area of feet twice daily. 454 g 0   zonisamide (ZONEGRAN) 100 MG capsule Take 2 capsules daily 180 capsule 3   mesalamine (PENTASA) 250  MG CR capsule Take 4 capsules (1,000 mg total) by mouth 3 (three) times daily. (Patient not taking: Reported on 04/26/2022) 360 capsule 5   Metamucil Fiber CHEW Chew 1 tablet by mouth daily. (Patient not taking: Reported on 04/26/2022)     Na Sulfate-K Sulfate-Mg Sulf 17.5-3.13-1.6 GM/177ML SOLN See admin instructions. (Patient not taking: Reported on 04/26/2022)     norethindrone (AYGESTIN) 5 MG tablet Take 1 tablet (5 mg total) by mouth 2 (two) times daily as needed (bleeding). For spotting take 1 tab daily. If flow take 2 tabs daily. If heavy bleeding take 3 tabs daily. When bleeding has stopped you can stop the medication. (Patient not taking: Reported on 04/26/2022) 180 tablet 3   No current facility-administered medications on file prior to visit.    ALLERGIES: No Known Allergies  FAMILY HISTORY: Family History  Problem Relation Age of Onset   Diabetes Mother        steroid induced; hx. colitis   Anesthesia problems Mother        severe headache lasting 2-3 days   Rheum arthritis Mother    Ulcerative colitis Mother    Fibromyalgia Mother    Henoch-Schonlein purpura Brother        in remission   Hypertension Maternal Grandmother    Hyperlipidemia Maternal Grandmother    Diabetes Maternal Grandmother    Kidney disease Maternal Grandfather    Hypertension Maternal Grandfather    Heart disease Maternal Grandfather    Hyperlipidemia Maternal Grandfather    Kidney disease Paternal Grandmother    Hypertension Paternal Grandmother    Diabetes Paternal Grandmother    Heart disease Paternal Grandmother    Hyperlipidemia Paternal Grandmother    Hypertension Paternal Grandfather    Hyperlipidemia Paternal Grandfather    Asthma Maternal Aunt    Seizures Maternal Uncle    Colon cancer Other        great aunt   Esophageal cancer Neg Hx    Esophageal varices Neg Hx    Pancreatic disease Neg Hx     SOCIAL HISTORY: Social History   Socioeconomic History   Marital status: Single     Spouse name: Not on file   Number of children: 0   Years of education: 12   Highest education level: Not on file  Occupational History   Occupation: [employer]  Tobacco Use   Smoking status: Never    Passive exposure: Never   Smokeless tobacco: Never  Vaping Use   Vaping Use: Never used  Substance and Sexual Activity  Alcohol use: No    Alcohol/week: 0.0 standard drinks of alcohol   Drug use: No   Sexual activity: Never    Birth control/protection: None  Other Topics Concern   Not on file  Social History Narrative   Jwan is graduated. [name] doing average.    She enjoys playing softball and basketball.   Lives with her parents.    Not working.   Left handed   Two story home      Had surgery on her legs in Feb and is physical therapy twice a week.   Left handed   No caffeine   Social Determinants of Health   Financial Resource Strain: Not on file  Food Insecurity: Not on file  Transportation Needs: Not on file  Physical Activity: Not on file  Stress: Not on file  Social Connections: Not on file  Intimate Partner Violence: Not on file     PHYSICAL EXAM: Vitals:   04/26/22 1134  BP: 99/66  Pulse: 78  SpO2: 100%   General: No acute distress, flat affect. She appears to have a tic where she rolls eyes to one side, noted throughout the visit (no unresponsiveness) Head:  Normocephalic/atraumatic Skin/Extremities: No rash, no edema Neurological Exam: alert and awake. No aphasia or dysarthria. Fund of knowledge is reduced. Attention and concentration are normal.  Able to follow 2-step commands. Cranial nerves: Pupils equal, round. Extraocular movements intact with no nystagmus. Visual fields full.  No facial asymmetry.  Motor: Bulk and tone normal, muscle strength 5/5 throughout with no pronator drift.   Finger to nose testing intact.  Gait slightly wide-based, no ataxia. No tremors.   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 Her 24-hour EEG showed rare generalized 4 Hz spike and wave discharges with frontal predominance in sleep. Her mother continues to note staring spells, as well as an increase in headaches occurring on a daily basis. Mother also reports more cognitive and behavioral changes. MRI brain with and without contrast will be ordered to assess for underlying structural abnormality. Zonisamide may be contributing, we discussed switching to Topiramate for seizure and headache prophylaxis, may help with anxiety as well. Start '25mg'$ /mL: 69m BID ('50mg'$  BID), side effects discussed. Start weaning off Zonisamide. They know to minimize headache rescue medications to 2-3 a week to avoid rebound headaches. Contniue close supervision. She does not drive. Follow-up in 3 months, call for any changes.    Thank you for allowing me to participate in her care.  Please do not hesitate to call for any questions or concerns.   KEllouise Newer M.D.   CC: Dr. CSharlet Salina

## 2022-04-26 NOTE — Patient Instructions (Addendum)
Good to see you.  Schedule MRI brain with and without contrast. Let me know when you have it scheduled so we can send in the prescription for Valium  2. Start Topiramate liquid '25mg'$ /ml: Take 63m twice a day  3. Wean off Zonisamide '100mg'$ : take 1 capsule every night for 1 week, then stop  4. Minimize Tylenol intake to 2-3 times a week to avoid rebound headaches  5. Follow-up in 3 months, call for any changes   Seizure Precautions: 1. If medication has been prescribed for you to prevent seizures, take it exactly as directed.  Do not stop taking the medicine without talking to your doctor first, even if you have not had a seizure in a long time.   2. Avoid activities in which a seizure would cause danger to yourself or to others.  Don't operate dangerous machinery, swim alone, or climb in high or dangerous places, such as on ladders, roofs, or girders.  Do not drive unless your doctor says you may.  3. If you have any warning that you may have a seizure, lay down in a safe place where you can't hurt yourself.    4.  No driving for 6 months from last seizure, as [facility]   Please refer to the following link [place] the [place] for more information: http://[email or web address].org/[email or web address]/driving/drivingu.cfm   5.  Maintain good sleep hygiene.  6.  Notify your neurology if you are planning pregnancy or if you become pregnant.  7.  Contact your doctor if you have any problems that may be related to the medicine you are taking.  8.  Call 911 and bring the patient back to the ED if:        A.  The seizure lasts longer than 5 minutes.       B.  The patient doesn't awaken shortly after the seizure  C.  The patient has new problems such as difficulty seeing, speaking or moving  D.  The patient was injured during the seizure  E.  The patient has a temperature over 102 F (39C)  F.  The patient vomited and now is having trouble  breathing

## 2022-04-27 ENCOUNTER — Other Ambulatory Visit: Payer: Self-pay | Admitting: Neurology

## 2022-04-27 ENCOUNTER — Encounter (INDEPENDENT_AMBULATORY_CARE_PROVIDER_SITE_OTHER): Payer: Self-pay | Admitting: Pharmacist

## 2022-04-27 ENCOUNTER — Telehealth (INDEPENDENT_AMBULATORY_CARE_PROVIDER_SITE_OTHER): Payer: Self-pay

## 2022-04-27 ENCOUNTER — Other Ambulatory Visit: Payer: Self-pay

## 2022-04-27 ENCOUNTER — Telehealth: Payer: Self-pay

## 2022-04-27 ENCOUNTER — Telehealth (INDEPENDENT_AMBULATORY_CARE_PROVIDER_SITE_OTHER): Payer: Self-pay | Admitting: Pharmacist

## 2022-04-27 ENCOUNTER — Other Ambulatory Visit (INDEPENDENT_AMBULATORY_CARE_PROVIDER_SITE_OTHER): Payer: Self-pay | Admitting: Pediatric Endocrinology

## 2022-04-27 DIAGNOSIS — E11649 Type 2 diabetes mellitus with hypoglycemia without coma: Secondary | ICD-10-CM

## 2022-04-27 MED ORDER — DIAZEPAM 5 MG PO TABS: ORAL_TABLET | ORAL | 0 refills | Status: DC

## 2022-04-27 NOTE — Telephone Encounter (Signed)
Pt needs a PA for her Topiramate 25 MG/ML SOLN

## 2022-04-27 NOTE — Telephone Encounter (Signed)
Attempted to return phone call to update that new script was sent today and PA was approved and also faxed to the pharmacy.  Left HIPAA approved voicemail for return phone call.

## 2022-04-27 NOTE — Telephone Encounter (Signed)
Faxed determination to pharmacy 

## 2022-04-27 NOTE — Telephone Encounter (Addendum)
Received information from patient and pharmacy medication needed PA, completed PA on covermymeds started by pharmacy.     Please [facility] in [place]

## 2022-04-27 NOTE — Telephone Encounter (Signed)
  Name of who is calling: Annetta  Caller's Relationship to Patient:  Best contact number: [phone]  Provider they see: Drexel Iha  Reason for call:  Mom called found Mounjaro at a [facility] in [place], they are saying she needs a new prescription because the other expired.      PRESCRIPTION REFILL ONLY  Name of prescription:  Pharmacy:

## 2022-04-28 NOTE — Telephone Encounter (Signed)
Messaged received from pt stating PA needed for [facility]. Intiated in covermymeds:  Key: [id number] - PA Case ID: [id number]

## 2022-04-28 NOTE — Telephone Encounter (Signed)
[facility] APPROVED THRU 04/29/2023

## 2022-04-29 ENCOUNTER — Other Ambulatory Visit: Payer: Self-pay

## 2022-04-30 NOTE — Telephone Encounter (Signed)
Patient Advocate Encounter   Received notification that prior authorization for Eprontia '25MG'$ /ML solution is required.   PA submitted on 04/30/2022 [place] [employer] Electronic Prior Authorization Form Status is pending       Lyndel Safe, [place] Patient Advocate Specialist [place] Patient Advocate Team Direct Number: [phone]  Fax: [phone]

## 2022-05-10 ENCOUNTER — Encounter: Payer: Self-pay | Admitting: Neurology

## 2022-05-10 MED ORDER — ZONISAMIDE 100 MG PO CAPS: ORAL_CAPSULE | ORAL | 5 refills | Status: DC

## 2022-05-11 NOTE — Telephone Encounter (Signed)
Patient Advocate Encounter  Prior Authorization for Eprontia '25MG'$ /ML solution has been approved through [employer].    [place]  Effective: 04-30-2022 to 10-31-2022

## 2022-05-12 ENCOUNTER — Other Ambulatory Visit (HOSPITAL_COMMUNITY): Payer: Self-pay

## 2022-05-13 ENCOUNTER — Ambulatory Visit (INDEPENDENT_AMBULATORY_CARE_PROVIDER_SITE_OTHER): Payer: 59 | Admitting: Behavioral Health

## 2022-05-13 ENCOUNTER — Ambulatory Visit
Admission: RE | Admit: 2022-05-13 | Discharge: 2022-05-13 | Disposition: A | Discharge status: Home / Self Care | Payer: 59 | Source: Ambulatory Visit | Attending: Neurology | Admitting: Neurology

## 2022-05-13 DIAGNOSIS — F419 Anxiety disorder, unspecified: Secondary | ICD-10-CM

## 2022-05-13 DIAGNOSIS — F418 Other specified anxiety disorders: Secondary | ICD-10-CM | POA: Diagnosis not present

## 2022-05-13 DIAGNOSIS — R519 Headache, unspecified: Secondary | ICD-10-CM

## 2022-05-13 DIAGNOSIS — G40409 Other generalized epilepsy and epileptic syndromes, not intractable, without status epilepticus: Secondary | ICD-10-CM

## 2022-05-13 DIAGNOSIS — G43009 Migraine without aura, not intractable, without status migrainosus: Secondary | ICD-10-CM

## 2022-05-13 DIAGNOSIS — R4189 Other symptoms and signs involving cognitive functions and awareness: Secondary | ICD-10-CM

## 2022-05-13 MED ORDER — GADOPICLENOL 0.5 MMOL/ML IV SOLN
8.5000 mL | Freq: Once | INTRAVENOUS | Status: AC | PRN
  Administered 2022-05-13: 8.5 mL via INTRAVENOUS

## 2022-05-13 NOTE — Progress Notes (Signed)
[name] Counselor/Therapist Progress Note  Patient ID: Deanna Levine, MRN: [id number],    Date: 05/13/2022  Time Spent: 55 min Caregility video; Pt is @ home in her room in private & Provider is working remote from [employer]   Treatment Type: Individual Therapy  Reported Symptoms: Elevated anx/dep & stress due to inc'd tension in the home w/her Parents  Mental Status Exam: Appearance:  Casual     Behavior: Appropriate and Sharing  Motor: Normal  Speech/Language:  Clear and Coherent  Affect: Appropriate  Mood: normal  Thought process: normal  Thought content:   WNL  Sensory/Perceptual disturbances:   WNL  Orientation: oriented to person, place, time/date, and situation  Attention: Good  Concentration: Good  Memory: WNL  Fund of knowledge:  Good  Insight:   Good  Judgment:  Good  Impulse Control: Good   Risk Assessment: Danger to Self:  No Self-injurious Behavior: No Danger to Others: No Duty to Warn:no Physical Aggression / Violence:No  Access to Firearms a concern: No  Gang Involvement:No   Subjective: Pt is c/o many issues w/her Parents. Pt Sz med has been changed &   Interventions: Family Systems  Diagnosis:Anxiety about health  Anxiety  Plan: Deanna Levine is having difficulty w/her Sz medication. She had an MRI today & will get results soon. [name] pursuing the Sz activity w/her Physician.   Deanna Levine is writing in her Notebook about her feelings. [name] examining why she had delays when she was younger. Walking late as a 22yo & other undescribed delays. Pt will request her Parents attend our next session.   Target Date: 06/13/2022  Progress: 4  Frequency: Twice monthly  Modality: Deanna Reaper, LMFT

## 2022-05-13 NOTE — Progress Notes (Signed)
                Terrea Bruster L Brynn Reznik, LMFT 

## 2022-05-19 ENCOUNTER — Telehealth: Payer: Self-pay

## 2022-05-19 NOTE — Telephone Encounter (Signed)
-----   Message from Cameron Sprang, MD sent at 05/18/2022  3:10 PM EDT ----- Pls let patient/mother know that I reviewed brain MRI, there is no tumor, stroke, or bleed. Nothing that would cause headaches or any of her other symptoms. There are minor changes that are typically seen in patients with diabetes and migraines that are nothing to be worried about, I will show them the scan on her follow-up. Thanks

## 2022-05-19 NOTE — Telephone Encounter (Signed)
Pt called informed Dr Delice Lesch reviewed brain MRI, there is no tumor, stroke, or bleed. Nothing that would cause headaches or any of her other symptoms. There are minor changes that are typically seen in patients with diabetes and migraines that are nothing to be worried about, Dr Delice Lesch  will show them the scan on her follow-up.

## 2022-06-01 ENCOUNTER — Ambulatory Visit (INDEPENDENT_AMBULATORY_CARE_PROVIDER_SITE_OTHER): Payer: 59 | Admitting: Pediatric Endocrinology

## 2022-06-01 ENCOUNTER — Encounter (INDEPENDENT_AMBULATORY_CARE_PROVIDER_SITE_OTHER): Payer: Self-pay | Admitting: Pediatric Endocrinology

## 2022-06-01 VITALS — BP 124/70 | HR 80 | Wt 180.6 lb

## 2022-06-01 DIAGNOSIS — E119 Type 2 diabetes mellitus without complications: Secondary | ICD-10-CM | POA: Diagnosis not present

## 2022-06-01 DIAGNOSIS — N939 Abnormal uterine and vaginal bleeding, unspecified: Secondary | ICD-10-CM

## 2022-06-01 DIAGNOSIS — Z7985 Long-term (current) use of injectable non-insulin antidiabetic drugs: Secondary | ICD-10-CM

## 2022-06-01 DIAGNOSIS — Z789 Other specified health status: Secondary | ICD-10-CM | POA: Diagnosis not present

## 2022-06-01 LAB — POCT GLYCOSYLATED HEMOGLOBIN (HGB A1C): Hemoglobin A1C: 5.1 % (ref 4.0–5.6)

## 2022-06-01 LAB — POCT GLUCOSE (DEVICE FOR HOME USE): Glucose Fasting, POC: 88 mg/dL (ref 70–99)

## 2022-06-01 NOTE — Patient Instructions (Signed)
I put in a referral to [facility]- they should call to schedule- but it will likely be in 6-9 months.   You will continue to see me in the meantime!

## 2022-06-01 NOTE — Progress Notes (Signed)
Subjective:  Subjective  Patient Name: Deanna Levine Date of Birth: 2000/05/02  MRN: [id number]  Deanna Levine  presents to clinic today for follow-up evaluation and management of her  obesity, prediabetes, acanthosis   HISTORY OF PRESENT ILLNESS:   Deanna Levine is a 22 y.o. [place] female   Deanna Levine is accompanied by dad  1. Deanna Levine was initially followed in pediatric endocrine clinic for early puberty complicated by profound developmental delay. [name] now post menarchal and follows in clinic for Type 2 Diabetes and metabolic syndrome.    2. The patient's last [facility] visit was on 02/18/22  [name] still taking 12.5 mg of Mounjaro once a week. She has not ben having issues getting her medication. She feels that it is working well for her and she feels good on it.   [name] helping her parents around the house and volunteering at the [employer] on Wednesdays. [name] still not allowed to stand for long periods of time.   She does walk twice a week for about 30 minutes around her neighborhood. [name] meant to limit time on her feet to less than 2 hours a day. Her orthopedist seems happy with how she is doing.   Last visit she had to get new pants. They still fit. This time she had to get new bras because her old ones were too big.   [name] not able to have a job. [name] now on disability.   Her sleep study from December shows that she seizing at night. They tried starting a new medication but Deanna Levine felt ill on the new drug and resumed her previous medication.   Her dermatologist prescribed Spironolactone - she is taking this now. [name] continuing to see some changes in her face hair.  She has had her period for the past 3 days. She states that it has been heavy. She no longer has her Nexplanon. [name] not currently taking any birth control as she has previously struggled to balance OCP with her seizure medication.   [name] now seeing a therapist virtually. [name] seeing them about every other week.     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     3. Pertinent Review of Systems:  Constitutional: The patient feels "good". The patient seems healthy and active. Eyes: Vision seems to be good. There are no recognized eye problems. Wears glasses.  Neck: The patient has no complaints of anterior neck swelling, soreness, tenderness, pressure, discomfort, or difficulty swallowing.   Heart: Heart rate increases with exercise or other physical activity. The patient has no complaints of palpitations, irregular heart beats, chest pain, or chest pressure.   Lungs: no asthma or wheezing.  Gastrointestinal: Bowel movents seem normal. The patient has no complaints of excessive hunger, acid reflux, upset stomach, stomach aches or pains, diarrhea. Some constipation. [name] seeing a new GI doc for increased gas.  Legs: Muscle mass and strength seem normal. There are no complaints of numbness, tingling, burning, or pain. No edema is noted.  Feet: Per HPI Neurologic: Has been having staring spells. [name] followed by [name] in adult neurology GYN/GU: Menarche 07/2014, age 82. LMP 3/31 Skin: She feels that she is doing much better with her skin sores.  Annual labs- done with PCP  in 2023. Due Spring 2024     PAST MEDICAL, FAMILY, AND SOCIAL HISTORY  Past  Medical History:  Diagnosis Date   Absence seizure disorder    Allergy    Colitis [id number]   Complication of anesthesia    slow to wake up from anesthesia, occ HA with anesthesia    Diabetes mellitus without complication    type 2   Difficulty swallowing pills    Dyspraxia    Eczema    Global developmental delay    Obesity (BMI 30-39.9)    PONV (postoperative nausea and vomiting)    Precocious female puberty    Seizures    last seizure 2012 - no current med   Sleep apnea    White-Sutton syndrome     Family History  Problem Relation Age of Onset    Diabetes Mother        steroid induced; hx. colitis   Anesthesia problems Mother        severe headache lasting 2-3 days   Rheum arthritis Mother    Ulcerative colitis Mother    Fibromyalgia Mother    Henoch-Schonlein purpura Brother        in remission   Hypertension Maternal Grandmother    Hyperlipidemia Maternal Grandmother    Diabetes Maternal Grandmother    Kidney disease Maternal Grandfather    Hypertension Maternal Grandfather    Heart disease Maternal Grandfather    Hyperlipidemia Maternal Grandfather    Kidney disease Paternal Grandmother    Hypertension Paternal Grandmother    Diabetes Paternal Grandmother    Heart disease Paternal Grandmother    Hyperlipidemia Paternal Grandmother    Hypertension Paternal Grandfather    Hyperlipidemia Paternal Grandfather    Asthma Maternal Aunt    Seizures Maternal Uncle    Colon cancer Other        great aunt   Esophageal cancer Neg Hx    Esophageal varices Neg Hx    Pancreatic disease Neg Hx      Current Outpatient Medications:    acetaminophen (TYLENOL) 160 MG/5ML elixir, Take 960 mg by mouth every 4 (four) hours as needed (headache)., Disp: , Rfl:    azelastine (ASTELIN) 0.1 % nasal spray, Place 1 spray into both nostrils 2 (two) times daily. Use in each nostril as directed, Disp: 30 mL, Rfl: 12   cetirizine (ZYRTEC) 10 MG tablet, Take 10 mg by mouth daily as needed for allergies., Disp: , Rfl:    chlorhexidine (HIBICLENS) 4 % external liquid, Apply topically daily as needed., Disp: 120 mL, Rfl: 0   Cholecalciferol (VITAMIN D3 GUMMIES ADULT PO), Take 2 tablets by mouth daily., Disp: , Rfl:    Clindamycin-Benzoyl Per, Refr, gel, Use as directed up to twice a day, Disp: 45 g, Rfl: 3   diazepam (VALIUM) 5 MG tablet, Take 1 tablet 30 minutes prior to MRI. May take second dose if needed., Disp: 2 tablet, Rfl: 0   diclofenac Sodium (VOLTAREN) 1 % GEL, Apply 4 g topically 4 (four) times daily., Disp: 100 g, Rfl: 4   dicyclomine  (BENTYL) 20 MG tablet, Take a half tablet 4 times daily before meals., Disp: 60 tablet, Rfl: 3   fluticasone (FLONASE) 50 MCG/ACT nasal spray, SHAKE LIQUID AND USE 2 SPRAYS IN EACH NOSTRIL DAILY, Disp: 48 g, Rfl: 1   ibuprofen (ADVIL) 200 MG tablet, Take 600 mg by mouth every 6 (six) hours as needed for moderate pain., Disp: , Rfl:    meloxicam (MOBIC) 15 MG tablet, Take 15 mg by mouth daily., Disp: , Rfl:    Menaquinone-7 (VITAMIN K2) 100 MCG CAPS,  Take 100 mcg by mouth daily., Disp: , Rfl:    ondansetron (ZOFRAN-ODT) 4 MG disintegrating tablet, Take 1 tablet (4 mg total) by mouth every 8 (eight) hours as needed for nausea or vomiting., Disp: 30 tablet, Rfl: 3   Propylene Glycol (SYSTANE BALANCE OP), Apply to eye., Disp: , Rfl:    spironolactone (ALDACTONE) 50 MG tablet, Take 50 mg by mouth daily., Disp: , Rfl:    tirzepatide (MOUNJARO) 12.5 MG/0.5ML Pen, Inject 12.5 mg into the skin once a week., Disp: 2 mL, Rfl: 2   tirzepatide (MOUNJARO) 12.5 MG/0.5ML Pen, INJECT 12.5 MG UNDER THE SKIN ONCE A WEEK, Disp: 6 mL, Rfl: 1   triamcinolone cream (KENALOG) 0.1 %, Apply to rough area of feet twice daily., Disp: 454 g, Rfl: 0   zonisamide (ZONEGRAN) 100 MG capsule, Take 2 capsules daily, Disp: 60 capsule, Rfl: 5   mesalamine (PENTASA) 250 MG CR capsule, Take 4 capsules (1,000 mg total) by mouth 3 (three) times daily. (Patient not taking: Reported on 04/26/2022), Disp: 360 capsule, Rfl: 5   Metamucil Fiber CHEW, Chew 1 tablet by mouth daily. (Patient not taking: Reported on 04/26/2022), Disp: , Rfl:    Na Sulfate-K Sulfate-Mg Sulf 17.5-3.13-1.6 GM/177ML SOLN, See admin instructions. (Patient not taking: Reported on 04/26/2022), Disp: , Rfl:    norethindrone (AYGESTIN) 5 MG tablet, Take 1 tablet (5 mg total) by mouth 2 (two) times daily as needed (bleeding). For spotting take 1 tab daily. If flow take 2 tabs daily. If heavy bleeding take 3 tabs daily. When bleeding has stopped you can stop the medication.  (Patient not taking: Reported on 04/26/2022), Disp: 180 tablet, Rfl: 3  Allergies as of 06/01/2022   (No Known Allergies)     reports that she has never smoked. She has never been exposed to tobacco smoke. She has never used smokeless tobacco. She reports that she does not drink alcohol and does not use drugs. Pediatric History  Patient Parents   Bruce, [place] (Mother)   Tharon Aquas Jr,Clif[place] (Father)   Other Topics Concern   Not on file  Social History Narrative   Anndrea is graduated. [name] doing average.    She enjoys playing softball and basketball.   Lives with her parents.    Not working.   Left handed   Two story home      Had surgery on her legs in Feb and is physical therapy twice a week.   Left handed   No caffeine   Unable to work. Now has disability.   Primary Care Provider: [name], MD     Objective:  Objective   Vital Signs:       02/18/22 11:55  BP 124/72  Pulse Rate 103 !  Weight 185 lb 12.8 oz  BMI (Calculated) 31.88  !: Data is abnormal   BP 124/70   Pulse 80   Wt 180 lb 9.6 oz (81.9 kg)   LMP 05/30/2022 (Exact Date)   BMI 31.00 kg/m  Growth %ile SmartLinks can only be used for patients less than 4 years old.    Ht Readings from Last 3 Encounters:  04/26/22 5\' 4"  (1.626 m)  03/10/22 5' 4.27" (1.632 m)  02/10/22 5\' 4"  (1.626 m)   Wt Readings from Last 3 Encounters:  06/01/22 180 lb 9.6 oz (81.9 kg)  04/26/22 184 lb 14.4 oz (83.9 kg)  03/10/22 186 lb 6.4 oz (84.6 kg)   HC Readings from Last 3 Encounters:  07/30/21 26.85" (68.2 cm)  Body surface area is 1.92 meters squared. Facility age limit for growth %iles is 20 years. Facility age limit for growth %iles is 20 years.   PHYSICAL EXAM:     Constitutional: The patient appears healthy and well nourished. The patient's height and weight are advanced for age.  She has lost 5 pounds since last visit.  Head: The head is normocephalic. Face: The face appears normal.  There are no obvious dysmorphic features. Eyes: The eyes appear to be normally formed and spaced. Gaze is conjugate. There is no obvious arcus or proptosis. Moisture appears normal. Ears: The ears are normally placed and appear externally normal. Mouth: The oropharynx and tongue appear normal. Dentition appears to be normal for age. Oral moisture is normal. Neck: The neck appears to be visibly normal. The thyroid gland is 13 grams in size. The consistency of the thyroid gland is normal. The thyroid gland is not tender to palpation. +1 acanthosis with thick scaling Lungs: no increased work of breathing Heart: regular pulses and peripheral perfusion Abdomen: The abdomen appears to be enlarged in size for the patient's age. There is no obvious hepatomegaly, splenomegaly, or other mass effect.  Arms: Muscle size and bulk are normal for age. Axillary acanthosis and hydradenitis (scarring)- no open lesions  Hands: There is no obvious tremor. Palmar muscles are normal for age. Palmar skin is normal. Palmar moisture is also normal. Swelling noted into proximal phalanges  Legs: Muscles appear normal for age. No edema is present.  Neurologic: Strength is normal for age in both the upper and lower extremities. Muscle tone is normal. Sensation to touch is normal in both the legs and feet.   GYN/GU: normal female Skin: She has multiple scars and a few open lesions under her breasts. She has scarring but no open lesions (one or two with white pus under the surface) in her axillae.   LAB DATA:    Lab Results  Component Value Date   HGBA1C 5.1 06/01/2022   HGBA1C 6.4 02/18/2022   HGBA1C 5.9 11/24/2021   HGBA1C 6.3 (H) 08/24/2021   HGBA1C 6.4 02/12/2021   HGBA1C 6.3 (A) 11/13/2020   HGBA1C 6.3 (A) 07/24/2020   HGBA1C 6.2 04/24/2020    Results for orders placed or performed in visit on 06/01/22  POCT Glucose (Device for Home Use)  Result Value Ref Range   Glucose Fasting, POC 88 70 - 99 mg/dL   POC  Glucose    POCT glycosylated hemoglobin (Hb A1C)  Result Value Ref Range   Hemoglobin A1C 5.1 4.0 - 5.6 %   HbA1c POC (<> result, manual entry)     HbA1c, POC (prediabetic range)     HbA1c, POC (controlled diabetic range)          Assessment and Plan:  Assessment  ASSESSMENT:   Ninja is a 22 y.o. AA female with history of precocious puberty (which increases risk of 123456, PCOS, Metabolic syndrome) who now meets criteria for type 2 diabetes    Type 2 diabetes  - Diagnosed in January 2021 - A1C has been stable and within target of <6.5% - Currently on Mounjaro 12.5 mg.weekly.  - Has had continued, slow, weight loss - Annual labs TODAY  DUB  - She no longer has a Nexplanon in place - [name] no longer taking OCP - [name] having heavy menses about once a month- but GYN has not been able to find a good solution.   Disability - She now has permanent disability  status.    PLAN:   1. Diagnostic: Orders Placed This Encounter  Procedures   Comprehensive metabolic panel   CBC   TSH   T4, free   C-peptide   Lipid panel   Microalbumin / creatinine urine ratio   Ambulatory referral to Endocrinology    Referral Priority:   Routine    Referral Type:   Consultation    Referral Reason:   Specialty Services Required    Referred to Provider:   Shamleffer, Melanie Crazier, MD    Number of Visits Requested:   1   POCT Glucose (Device for Home Use)   POCT glycosylated hemoglobin (Hb A1C)   COLLECTION CAPILLARY BLOOD SPECIMEN    2. Therapeutic: Continue Mounjaro 12.5 mg weekly   3. Patient education: Lengthy discussion as above.  4. Follow-up: Return in about 3 months (around 08/30/2022).    Referral placed to adult endocrine. Will continue to see her here in the transition.   Lelon Huh, MD  Level of Service: >30 minutes spent today reviewing the medical chart, counseling the patient/family, and documenting today's encounter.

## 2022-06-02 LAB — CBC
HCT: 33.1 % — ABNORMAL LOW (ref 35.0–45.0)
Hemoglobin: 10 g/dL — ABNORMAL LOW (ref 11.7–15.5)
MCH: 21.3 pg — ABNORMAL LOW (ref 27.0–33.0)
MCHC: 30.2 g/dL — ABNORMAL LOW (ref 32.0–36.0)
MCV: 70.4 fL — ABNORMAL LOW (ref 80.0–100.0)
MPV: 10.7 fL (ref 7.5–12.5)
Platelets: 353 10*3/uL (ref 140–400)
RBC: 4.7 10*6/uL (ref 3.80–5.10)
RDW: 16.5 % — ABNORMAL HIGH (ref 11.0–15.0)
WBC: 6 10*3/uL (ref 3.8–10.8)

## 2022-06-02 LAB — COMPREHENSIVE METABOLIC PANEL
AG Ratio: 1.6 (calc) (ref 1.0–2.5)
ALT: 10 U/L (ref 6–29)
AST: 14 U/L (ref 10–30)
Albumin: 4.5 g/dL (ref 3.6–5.1)
Alkaline phosphatase (APISO): 73 U/L (ref 31–125)
BUN: 14 mg/dL (ref 7–25)
CO2: 20 mmol/L (ref 20–32)
Calcium: 9.3 mg/dL (ref 8.6–10.2)
Chloride: 111 mmol/L — ABNORMAL HIGH (ref 98–110)
Creat: 0.7 mg/dL (ref 0.50–0.96)
Globulin: 2.9 g/dL (calc) (ref 1.9–3.7)
Glucose, Bld: 89 mg/dL (ref 65–99)
Potassium: 4.4 mmol/L (ref 3.5–5.3)
Sodium: 141 mmol/L (ref 135–146)
Total Bilirubin: 0.5 mg/dL (ref 0.2–1.2)
Total Protein: 7.4 g/dL (ref 6.1–8.1)

## 2022-06-02 LAB — LIPID PANEL
Cholesterol: 155 mg/dL (ref <200)
HDL: 38 mg/dL — ABNORMAL LOW (ref >=50)
LDL Cholesterol (Calc): 103 mg/dL (calc) — ABNORMAL HIGH
Non-HDL Cholesterol (Calc): 117 mg/dL (calc) (ref <130)
Total CHOL/HDL Ratio: 4.1 (calc) (ref <5.0)
Triglycerides: 58 mg/dL (ref <150)

## 2022-06-02 LAB — C-PEPTIDE: C-Peptide: 2.09 ng/mL (ref 0.80–3.85)

## 2022-06-02 LAB — MICROALBUMIN / CREATININE URINE RATIO
Creatinine, Urine: 157 mg/dL (ref 20–275)
Microalb Creat Ratio: 20 mg/g creat (ref <30)
Microalb, Ur: 3.1 mg/dL

## 2022-06-02 LAB — TSH: TSH: 1.14 mIU/L

## 2022-06-02 LAB — T4, FREE: Free T4: 1 ng/dL (ref 0.8–1.8)

## 2022-06-18 ENCOUNTER — Ambulatory Visit (INDEPENDENT_AMBULATORY_CARE_PROVIDER_SITE_OTHER): Payer: 59 | Admitting: Behavioral Health

## 2022-06-18 DIAGNOSIS — R4589 Other symptoms and signs involving emotional state: Secondary | ICD-10-CM | POA: Diagnosis not present

## 2022-06-18 DIAGNOSIS — F419 Anxiety disorder, unspecified: Secondary | ICD-10-CM | POA: Diagnosis not present

## 2022-06-18 NOTE — Progress Notes (Signed)
[facility] Counselor/Therapist Progress Note  Patient ID: Deanna Levine, MRN: [id number],    Date: 06/18/2022  Time Spent: 55 min Caregility video; Pt is in the Living Rm of home w/privacy & Provider working remotely @ Home Office.  Treatment Type: Family with patient  Reported Symptoms: Mother is speaking w/Clinician today in addition to Pt. Mother is sensitive to Pt's needs & needs of the Family. 22yo Deanna Levine is also supportive of Pt.   Mental Status Exam: Appearance:  Casual     Behavior: Appropriate and Sharing  Motor: Normal  Speech/Language:  Clear and Coherent  Affect: Appropriate  Mood: anxious  Thought process: concrete  Thought content:   Rumination  Sensory/Perceptual disturbances:   WNL of White-Sutton Syndrome  Orientation: oriented to person, place, and time/date  Attention: Good  Concentration: Good  Memory: WNL  Fund of knowledge:  Fair  Insight:   Fair  Judgment:  Fair  Impulse Control: Fair   Risk Assessment: Danger to Self:  No Self-injurious Behavior: No Danger to Others: No Duty to Warn:no Physical Aggression / Violence:No  Access to Firearms a concern: No  Gang Involvement:No   Subjective: Pt is slow to visit today & Mother Deanna Levine joined the session to meet & provide Hx, along w/Dad Deanna Levine. Parents are fully invld in daily interactions, teaching, & helping Pt learn ADL, chores, & projects she can be invld in around the home to learn new skills.   Mother hopes we can cover the fllwg: -Pt tantrums (avoidance vs health issues as excuses) -Ability to help around the home -Money skills -Books of interest to read -Inc'd  understanding of the Team Work Family requires  Interventions: Family Systems  Diagnosis:Anxiety  Anxiety about health  Plan: Nashea Chumney we will meet again soon so she has her time. Very helpful to meet Parents as we have discussed since the initiation of psychotherapy. We will cont to work on Velencia's  issues & also those her Parents mentioned today.  Target Date: 07/29/2022  Progress: 4  Frequency: Once every 3-4 wks  Modality: Family w/Pt today  Deneise Lever, LMFT

## 2022-06-18 NOTE — Progress Notes (Signed)
                Nicoya Friel L Axelle Szwed, LMFT 

## 2022-06-23 ENCOUNTER — Encounter: Payer: Self-pay | Admitting: Internal Medicine

## 2022-06-23 DIAGNOSIS — M6701 Short Achilles tendon (acquired), right ankle: Secondary | ICD-10-CM

## 2022-06-23 DIAGNOSIS — Q8789 Other specified congenital malformation syndromes, not elsewhere classified: Secondary | ICD-10-CM

## 2022-06-30 ENCOUNTER — Ambulatory Visit (INDEPENDENT_AMBULATORY_CARE_PROVIDER_SITE_OTHER): Payer: 59 | Admitting: Behavioral Health

## 2022-06-30 DIAGNOSIS — R4589 Other symptoms and signs involving emotional state: Secondary | ICD-10-CM | POA: Diagnosis not present

## 2022-06-30 DIAGNOSIS — F419 Anxiety disorder, unspecified: Secondary | ICD-10-CM

## 2022-06-30 NOTE — Progress Notes (Signed)
[facility] Counselor/Therapist Progress Note  Patient ID: De Libman, MRN: [id number],    Date: 06/30/2022  Time Spent: 55 min Caregility video; Pt is outside on the porch in private & Provider working remotely from [facility]   Treatment Type: Individual Therapy  Reported Symptoms: Reduction in anx/dep & stressors   Mental Status Exam: Appearance:  Casual     Behavior: Appropriate and Sharing  Motor: Normal  Speech/Language:  Clear and Coherent  Affect: Appropriate  Mood: normal  Thought process: normal  Thought content:   WNL  Sensory/Perceptual disturbances:   WNL  Orientation: oriented to person, place, time/date, and situation  Attention: Good  Concentration: Good  Memory: WNL  Fund of knowledge:  Good  Insight:   Good  Judgment:  Good  Impulse Control: Good   Risk Assessment: Danger to Self:  No Self-injurious Behavior: No Danger to Others: No Duty to Warn:no Physical Aggression / Violence:No  Access to Firearms a concern: No  Gang Involvement:No   Subjective: Pt is upbeat today & excited for upcoming visit to her Neurologist on May 6th, her OT on May 30th, & her hair on Friday. [name] also working in her [employer]. Pt has started to read books again. [name] trying to read 20-30pp/day! She has lost 45#!  [name] anxious for the shootings she has seen on the News that occurred in [place].  [name] excited about her recent trip to [place] to view the [employer] w/her Parents. The performance was excellent!  Interventions: Solution-Oriented/Positive Psychology and Psycho-education/Bibliotherapy  Diagnosis:Anxiety  Anxiety about health  Plan: Soraya will make her calls to relatives who are sick & brighten their days to reduce loneliness. She has a Sing-a-long planned w/her [employer], running errands w/her Mother & Aunt, & record her notes for the Neurologist on her phone. Sherry is doing well & using her tools  for anx/dep to reduce her worry.   Target Date: 07/29/2022  Progress: 6  Frequency: Once every 3-4 wks  Modality: Claretta Fraise, LMFT

## 2022-06-30 NOTE — Progress Notes (Signed)
                Puja Caffey L Jaslin Novitski, LMFT 

## 2022-07-05 ENCOUNTER — Encounter: Payer: Self-pay | Admitting: Neurology

## 2022-07-05 ENCOUNTER — Ambulatory Visit: Payer: 59 | Admitting: Neurology

## 2022-07-05 VITALS — BP 112/57 | HR 89 | Ht 64.0 in | Wt 179.0 lb

## 2022-07-05 DIAGNOSIS — G40409 Other generalized epilepsy and epileptic syndromes, not intractable, without status epilepticus: Secondary | ICD-10-CM

## 2022-07-05 DIAGNOSIS — G43009 Migraine without aura, not intractable, without status migrainosus: Secondary | ICD-10-CM

## 2022-07-05 MED ORDER — ZONISAMIDE 100 MG PO CAPS: ORAL_CAPSULE | ORAL | 3 refills | Status: DC

## 2022-07-05 NOTE — Patient Instructions (Addendum)
Glad you are feeling better. Continue Zonisamide 100mg : take 2 capsules every night. Keep a calendar of your headaches. Follow-up in 6 months, call for any changes.   Seizure Precautions: 1. If medication has been prescribed for you to prevent seizures, take it exactly as directed.  Do not stop taking the medicine without talking to your doctor first, even if you have not had a seizure in a long time.   2. Avoid activities in which a seizure would cause danger to yourself or to others.  Don't operate dangerous machinery, swim alone, or climb in high or dangerous places, such as on ladders, roofs, or girders.  Do not drive unless your doctor says you may.  3. If you have any warning that you may have a seizure, lay down in a safe place where you can't hurt yourself.    4.  No driving for 6 months from last seizure, as per [facility].   Please refer to the following link on the Epilepsy Foundation of America's website for more information: http://www.epilepsyfoundation.org/answerplace/Social/driving/drivingu.cfm   5.  Maintain good sleep hygiene. Avoid alcohol.  6.  Notify your neurology if you are planning pregnancy or if you become pregnant.  7.  Contact your doctor if you have any problems that may be related to the medicine you are taking.  8.  Call 911 and bring the patient back to the ED if:        A.  The seizure lasts longer than 5 minutes.       B.  The patient doesn't awaken shortly after the seizure  C.  The patient has new problems such as difficulty seeing, speaking or moving  D.  The patient was injured during the seizure  E.  The patient has a temperature over 102 F (39C)  F.  The patient vomited and now is having trouble breathing

## 2022-07-05 NOTE — Progress Notes (Signed)
NEUROLOGY FOLLOW UP OFFICE NOTE  Biannca Shiraishi [id number] [birth year]  HISTORY OF PRESENT ILLNESS: I had the pleasure of seeing Deanna Levine in follow-up in the neurology clinic on 07/05/2022.  The patient was last seen 3 months ago for primary generalized epilepsy and headaches. [name] again accompanied by her mother who helps supplement the history today. Records and images were personally reviewed where available.  I personally reviewed MRI brain with and without contrast done 04/2022 which did not show any acute changes. There were bilateral frontal predominant multifocal hyperintense T2-weighted signal in the white matter seen. On her last visit, they reported daily headaches, staring spells, as well as more behavioral issues. We discussed switching Zonisamide to Topiramate, however she had side effects of feeling sick and switched back to Zonisamide in March. They report that the headaches are not like before, she has them around once a week. Her mother also reports they had her hormonal implant taken out. Mother has not witnessed any staring spells since last visit. Her mother notes sleep difficulties, she tries to sleep, talks in her sleep, moving back and forth until she finally settles down at 3am, waking up at 1pm. She plays on her PS4 or uses her phone in her room. Her mother is trying to reduce screen time. They are also working on her behaviors, she sees a therapist. She will be seeing OT as well for learning life skills. No falls.   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 She was seen by Dr. Allena Katz in August 2021 for concern  for seizures. She was previously followed by pediatric neurologist Dr. Sharene Skeans, notes were reviewed. Seizures began at age 89 with multiple absence seizures daily where she would drop her hand to her side and begin drooling, unresponsive for around 2 minutes followed by sleepiness. No convulsive seizures. At that time, MRI brain and initial EEG were normal. She was treated with carbamazepine with no further seizures after 2010, however repeat EEG in 2012 reported diphasic sharply contoured slow wave activity seen bilaterally synchronously in the central, temporal, and parietal region so she was continued on medication. She had another EEG in 2015 which was normal and she was tapered off carbamazepine. Of note, on her 2017 clinic visit, her mother reported that she stares sometimes but it seems to be daydreaming or not paying attention rather than seizure activity because she would respond when called. She presented to Dr. Allena Katz in 2021 with her mother reporting very brief staring spells lasting a few seconds, different from her typical seizures because they were much shorter and would not recur. They appeared as if she was daydreaming or not paying attention, worse with stress. [name] accompanied today by her father who has not seen the staring episodes but states that she does daydream and sometimes would not respond, saying she does not hear them calling her. She states she feels good. She denies any gaps in time, olfactory/gustatory hallucinations, focal numbness/tingling, myoclonic jerks. No headaches, dizziness, vision changes, no falls. She has been having orthopedic difficulties in her feet and hands and was ultimately diagnosed with WSS a few months ago. She was previously working but has been taken  out of work because she is unable to stand for prolonged periods and it affects her muscle skills. She manages her own medications, her father reports she is pretty good with this. Sleep is good, she sleeps 9-10  hours. She states her mood is okay, her father notes there has been some stress recently.   Epilepsy Risk Factors:  White-Sutton Syndrome. She had global developmental delay since infancy. There is no history of febrile convulsions, CNS infections such as meningitis/encephalitis, significant traumatic brain injury, neurosurgical procedures, or family history of seizures.  Prior AEDs: carbamazepine, topiramate (nausea)  Laboratory Data: EEGs: EEG 2012 reported diphasic sharply contoured slow wave activity seen bilaterally synchronously in the central, temporal, and parietal region EEG normal 2015 and 2018 48-hour EEG 04/2021 abnormal with rare generalized 4 Hz spike and wave discharges with frontal predominance seen in sleep.  MRI brain in 2003 unremarkable.   PAST MEDICAL HISTORY: Past Medical History:  Diagnosis Date   Absence seizure disorder (HCC)    Allergy    Colitis 07/16/2021   Complication of anesthesia    slow to wake up from anesthesia, occ HA with anesthesia    Diabetes mellitus without complication (HCC)    type 2   Difficulty swallowing pills    Dyspraxia    Eczema    Global developmental delay    Obesity (BMI 30-39.9)    PONV (postoperative nausea and vomiting)    Precocious female puberty    Seizures (HCC)    last seizure 2012 - no current med   Sleep apnea    White-Sutton syndrome     MEDICATIONS: Current Outpatient Medications on File Prior to Visit  Medication Sig Dispense Refill   acetaminophen (TYLENOL) 160 MG/5ML elixir Take 960 mg by mouth every 4 (four) hours as needed (headache).     azelastine (ASTELIN) 0.1 % nasal spray Place 1 spray into both nostrils 2 (two) times daily. Use in each nostril as directed 30 mL 12   cetirizine (ZYRTEC) 10 MG tablet Take 10 mg by mouth daily as needed for allergies.     chlorhexidine (HIBICLENS) 4 % external liquid Apply topically daily as needed. 120 mL 0   Cholecalciferol (VITAMIN D3 GUMMIES ADULT PO) Take 2  tablets by mouth daily.     Clindamycin-Benzoyl Per, Refr, gel Use as directed up to twice a day 45 g 3   diclofenac Sodium (VOLTAREN) 1 % GEL Apply 4 g topically 4 (four) times daily. 100 g 4   dicyclomine (BENTYL) 20 MG tablet Take a half tablet 4 times daily before meals. 60 tablet 3   fluticasone (FLONASE) 50 MCG/ACT nasal spray SHAKE LIQUID AND USE 2 SPRAYS IN EACH NOSTRIL DAILY 48 g 1   ibuprofen (ADVIL) 200 MG tablet Take 600 mg by mouth every 6 (six) hours as needed for moderate pain.     meloxicam (MOBIC) 15 MG tablet Take 15 mg by mouth daily.     Menaquinone-7 (VITAMIN K2) 100 MCG CAPS Take 100 mcg by mouth daily.     mesalamine (PENTASA) 250 MG CR capsule Take 4 capsules (1,000 mg total) by mouth 3 (three) times daily. 360 capsule 5   Metamucil Fiber CHEW Chew 1 tablet by mouth daily.     Na Sulfate-K Sulfate-Mg Sulf 17.5-3.13-1.6 GM/177ML SOLN See admin instructions.     ondansetron (ZOFRAN-ODT) 4 MG disintegrating tablet Take 1 tablet (4 mg total) by mouth every 8 (eight) hours as needed for nausea or vomiting. 30 tablet 3  Propylene Glycol (SYSTANE BALANCE OP) Apply to eye.     spironolactone (ALDACTONE) 50 MG tablet Take 50 mg by mouth daily.     tirzepatide (MOUNJARO) 12.5 MG/0.5ML Pen Inject 12.5 mg into the skin once a week. 2 mL 2   tirzepatide (MOUNJARO) 12.5 MG/0.5ML Pen INJECT 12.5 MG UNDER THE SKIN ONCE A WEEK 6 mL 1   triamcinolone cream (KENALOG) 0.1 % Apply to rough area of feet twice daily. 454 g 0   zonisamide (ZONEGRAN) 100 MG capsule Take 2 capsules daily 60 capsule 5   No current facility-administered medications on file prior to visit.    ALLERGIES: No Known Allergies  FAMILY HISTORY: Family History  Problem Relation Age of Onset   Diabetes Mother        steroid induced; hx. colitis   Anesthesia problems Mother        severe headache lasting 2-3 days   Rheum arthritis Mother    Ulcerative colitis Mother    Fibromyalgia Mother    Henoch-Schonlein  purpura Brother        in remission   Hypertension Maternal Grandmother    Hyperlipidemia Maternal Grandmother    Diabetes Maternal Grandmother    Kidney disease Maternal Grandfather    Hypertension Maternal Grandfather    Heart disease Maternal Grandfather    Hyperlipidemia Maternal Grandfather    Kidney disease Paternal Grandmother    Hypertension Paternal Grandmother    Diabetes Paternal Grandmother    Heart disease Paternal Grandmother    Hyperlipidemia Paternal Grandmother    Hypertension Paternal Grandfather    Hyperlipidemia Paternal Grandfather    Asthma Maternal Aunt    Seizures Maternal Uncle    Colon cancer Other        great aunt   Esophageal cancer Neg Hx    Esophageal varices Neg Hx    Pancreatic disease Neg Hx     SOCIAL HISTORY: Social History   Socioeconomic History   Marital status: Single    Spouse name: Not on file   Number of children: 0   Years of education: 12   Highest education level: Not on file  Occupational History   Occupation: [employer]  Tobacco Use   Smoking status: Never    Passive exposure: Never   Smokeless tobacco: Never  Vaping Use   Vaping Use: Never used  Substance and Sexual Activity   Alcohol use: No    Alcohol/week: 0.0 standard drinks of alcohol   Drug use: No   Sexual activity: Never    Birth control/protection: None  Other Topics Concern   Not on file  Social History Narrative   Kellyanne is graduated. [name] doing average.    She enjoys playing softball and basketball.   Lives with her parents.    Not working.   Left handed   Two story home      Had surgery on her legs in Feb and is physical therapy twice a week.   Left handed   No caffeine   Social Determinants of Health   Financial Resource Strain: Not on file  Food Insecurity: Not on file  Transportation Needs: Not on file  Physical Activity: Not on file  Stress: Not on file  Social Connections: Not on file  Intimate Partner Violence: Not on file      PHYSICAL EXAM: Vitals:   07/05/22 0948  BP: 98/68  Pulse: 89  SpO2: 96%   General: No acute distress Head:  Normocephalic/atraumatic Skin/Extremities: No rash, no  edema, wearing both AFOs Neurological Exam: alert and awake. No aphasia or dysarthria. Fund of knowledge is appropriate. Attention and concentration are normal.   Cranial nerves: Pupils equal, round. Extraocular movements intact with no nystagmus. Visual fields full.  No facial asymmetry.  Motor: Bulk and tone normal, muscle strength 5/5 throughout with no pronator drift.   Finger to nose testing intact.  Gait slightly wide based, wearing both AFOs today. No ataxia. Romberg negative.   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 Her 24-hour EEG showed rare generalized 4 Hz spike and wave discharges with frontal predominance in sleep. MRI brain no acute changes, there are a few white matter signal changes likely chronic microvascular disease. Her headaches have significantly improved with no change in Zonisamide, she continues on 200mg  qhs (side effects on Topiramate). Her mother has not seen any staring spells recently. We had an extensive discussion about sleep and screen time. Continue close supervision. She does not drive. Follow-up in 6 months, call for any changes.   Thank you for allowing me to participate in her care.  Please do not hesitate to call for any questions or concerns.    Patrcia Dolly, M.D.   CC: Dr. Okey Dupre    CC: Dr. Okey Dupre

## 2022-07-13 ENCOUNTER — Ambulatory Visit (INDEPENDENT_AMBULATORY_CARE_PROVIDER_SITE_OTHER): Payer: 59 | Admitting: Podiatry

## 2022-07-13 VITALS — BP 108/65

## 2022-07-13 DIAGNOSIS — E119 Type 2 diabetes mellitus without complications: Secondary | ICD-10-CM | POA: Diagnosis not present

## 2022-07-13 DIAGNOSIS — B351 Tinea unguium: Secondary | ICD-10-CM | POA: Diagnosis not present

## 2022-07-13 DIAGNOSIS — M79674 Pain in right toe(s): Secondary | ICD-10-CM

## 2022-07-13 DIAGNOSIS — M79675 Pain in left toe(s): Secondary | ICD-10-CM

## 2022-07-13 NOTE — Progress Notes (Unsigned)
  Subjective:  Patient ID: Deanna Levine, female    DOB: 11-02-2000,  MRN: [id number]  Deanna Levine presents to clinic today for {jgcomplaint:23593} No chief complaint on file.  New problem(s): None. {jgcomplaint:23593}  PCP is Myrlene Broker, MD.  No Known Allergies  Review of Systems: Negative except as noted in the HPI.  Objective: No changes noted in today's physical examination. There were no vitals filed for this visit. Deanna Levine is a pleasant 22 y.o. female {jgbodyhabitus:24098} AAO x 3.  Vascular Examination: CFT immediate b/l LE. Palpable DP/PT pulses b/l LE. Digital hair present b/l. Skin temperature gradient WNL b/l. No pain with calf compression b/l. No edema noted b/l. No cyanosis or clubbing noted b/l LE.  Dermatological Examination: Pedal integument with normal turgor, texture and tone BLE. No open wounds b/l LE.   Toenails bilateral great toes elongated, discolored, dystrophic, thickened, and crumbly with subungual debris and tenderness to dorsal palpation.   Rough pedal skin noted b/l heels. No erythema, no edema, no drainage.  Pedal skin noted to exhibit signs of poor pedal hygiene with noted foot odor b/l and interdigital debris. No open wounds noted.  No hyperkeratotic nor porokeratotic lesions present on today's visit.  Well healed surgical scars b/l feet.  Musculoskeletal Examination: Muscle strength 5/5 to all lower extremity muscle groups bilaterally. Pes planus deformity noted bilateral LE.  Neurological Examination: Protective sensation intact 5/5 intact bilaterally with 10g monofilament b/l. Vibratory sensation intact b/l.  Assessment/Plan: No diagnosis found.  No orders of the defined types were placed in this encounter.   None {Jgplan:23602::"-Patient/POA to call should there be question/concern in the interim."}   No follow-ups on file.  Freddie Breech, DPM

## 2022-07-14 ENCOUNTER — Encounter: Payer: Self-pay | Admitting: Podiatry

## 2022-07-16 ENCOUNTER — Encounter: Payer: Self-pay | Admitting: Podiatry

## 2022-07-19 ENCOUNTER — Other Ambulatory Visit: Payer: Self-pay | Admitting: Podiatry

## 2022-07-19 DIAGNOSIS — M19071 Primary osteoarthritis, right ankle and foot: Secondary | ICD-10-CM

## 2022-07-19 DIAGNOSIS — Q6689 Other  specified congenital deformities of feet: Secondary | ICD-10-CM

## 2022-07-22 ENCOUNTER — Ambulatory Visit: Payer: 59 | Admitting: Behavioral Health

## 2022-07-22 DIAGNOSIS — R4589 Other symptoms and signs involving emotional state: Secondary | ICD-10-CM | POA: Diagnosis not present

## 2022-07-22 DIAGNOSIS — F419 Anxiety disorder, unspecified: Secondary | ICD-10-CM

## 2022-07-22 NOTE — Progress Notes (Signed)
[facility] Counselor/Therapist Progress Note  Patient ID: Deanna Levine, MRN: [id number],    Date: 07/22/2022  Time Spent: 55 min Caregility video; Pt is home in private & Provider is working remote from [employer]   Treatment Type: Individual Therapy  Reported Symptoms: Reduction in anx/dep & stress due to keeping busy w/chores & helping the home while Mother is away  Mental Status Exam: Appearance:  Casual     Behavior: Appropriate and Sharing  Motor: Normal  Speech/Language:  Clear and Coherent  Affect: Appropriate  Mood: normal  Thought process: normal  Thought content:   WNL  Sensory/Perceptual disturbances:   WNL  Orientation: oriented to person, place, and time/date  Attention: Good  Concentration: Good  Memory: WNL  Fund of knowledge:  Good  Insight:   Fair  Judgment:  Good  Impulse Control: Good   Risk Assessment: Danger to Self:  No Self-injurious Behavior: No Danger to Others: No Duty to Warn:no Physical Aggression / Violence:No  Access to Firearms a concern: No  Gang Involvement:No   Subjective: Pt is upbeat today & positive about her work exp w/her Parents on the [employer]. 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 It is helping her to do this.    Interventions: Family Systems  Diagnosis:Anxiety  Anxiety about health  Plan: Deanna Levine is managing her health issues w/her visits to Providers & her attn to the timing of scheduling. 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 She has many ideas on this topic. She will cont to contribute to the [facility]. She has done many chores to help out.   Target Date: 08/29/2022  Progress: 5  Frequency: Once every 2-3 wks  Modality: Claretta Fraise, LMFT

## 2022-07-22 NOTE — Progress Notes (Signed)
                Minela Bridgewater L Analya Louissaint, LMFT 

## 2022-07-23 ENCOUNTER — Other Ambulatory Visit: Payer: Medicaid Other

## 2022-07-23 ENCOUNTER — Other Ambulatory Visit: Payer: Self-pay

## 2022-07-23 DIAGNOSIS — E119 Type 2 diabetes mellitus without complications: Secondary | ICD-10-CM

## 2022-07-28 ENCOUNTER — Ambulatory Visit: Payer: Medicaid Other | Admitting: "Endocrinology

## 2022-07-29 ENCOUNTER — Ambulatory Visit: Payer: 59

## 2022-07-30 ENCOUNTER — Other Ambulatory Visit: Payer: 59

## 2022-08-04 DIAGNOSIS — Z79899 Other long term (current) drug therapy: Secondary | ICD-10-CM | POA: Insufficient documentation

## 2022-08-10 ENCOUNTER — Encounter: Payer: Self-pay | Admitting: Podiatry

## 2022-08-12 ENCOUNTER — Ambulatory Visit
Admission: RE | Admit: 2022-08-12 | Discharge: 2022-08-12 | Disposition: A | Discharge status: Home / Self Care | Payer: 59 | Source: Ambulatory Visit | Attending: Podiatry | Admitting: Podiatry

## 2022-08-12 ENCOUNTER — Encounter: Payer: Self-pay | Admitting: Podiatry

## 2022-08-12 DIAGNOSIS — M19071 Primary osteoarthritis, right ankle and foot: Secondary | ICD-10-CM

## 2022-08-12 DIAGNOSIS — Q6689 Other  specified congenital deformities of feet: Secondary | ICD-10-CM

## 2022-08-13 ENCOUNTER — Telehealth: Payer: Self-pay | Admitting: Podiatry

## 2022-08-13 NOTE — Telephone Encounter (Signed)
Received call from pts mom stating she had received call from the office to call to get scheduled for a surgical consult. I do not see anything in there. Pt had ct yesterday. Please advise

## 2022-08-19 ENCOUNTER — Encounter: Payer: Self-pay | Admitting: Occupational Therapy

## 2022-08-19 ENCOUNTER — Ambulatory Visit (INDEPENDENT_AMBULATORY_CARE_PROVIDER_SITE_OTHER): Payer: 59 | Admitting: Behavioral Health

## 2022-08-19 ENCOUNTER — Ambulatory Visit: Payer: 59 | Attending: Internal Medicine | Admitting: Occupational Therapy

## 2022-08-19 DIAGNOSIS — R29898 Other symptoms and signs involving the musculoskeletal system: Secondary | ICD-10-CM

## 2022-08-19 DIAGNOSIS — R41840 Attention and concentration deficit: Secondary | ICD-10-CM

## 2022-08-19 DIAGNOSIS — M6701 Short Achilles tendon (acquired), right ankle: Secondary | ICD-10-CM | POA: Insufficient documentation

## 2022-08-19 DIAGNOSIS — F419 Anxiety disorder, unspecified: Secondary | ICD-10-CM

## 2022-08-19 DIAGNOSIS — R208 Other disturbances of skin sensation: Secondary | ICD-10-CM | POA: Diagnosis present

## 2022-08-19 DIAGNOSIS — R4589 Other symptoms and signs involving emotional state: Secondary | ICD-10-CM

## 2022-08-19 DIAGNOSIS — M6702 Short Achilles tendon (acquired), left ankle: Secondary | ICD-10-CM | POA: Insufficient documentation

## 2022-08-19 DIAGNOSIS — Q8789 Other specified congenital malformation syndromes, not elsewhere classified: Secondary | ICD-10-CM | POA: Diagnosis not present

## 2022-08-19 NOTE — Progress Notes (Signed)
                Vanity Larsson L Canio Winokur, LMFT 

## 2022-08-19 NOTE — Therapy (Signed)
OUTPATIENT OCCUPATIONAL THERAPY EVALUATION  Patient Name: Deanna Levine MRN: [id number] DOB:[birth year], 22 y.o., female Today's Date: 08/19/2022  PCP: Myrlene Broker, MD  REFERRING PROVIDER: Myrlene Broker, MD  END OF SESSION:  OT End of Session - 08/19/22 1101     Visit Number 1    Number of Visits 13    Date for OT Re-Evaluation 10/07/22    Authorization Type [employer] - no auth required (21 VL)    OT Start Time 1105    OT Stop Time 1146    OT Time Calculation (min) 41 min    Activity Tolerance Patient tolerated treatment well    Behavior During Therapy WFL for tasks assessed/performed             Past Medical History:  Diagnosis Date   Absence seizure disorder (HCC)    Allergy    Colitis 07/16/2021   Complication of anesthesia    slow to wake up from anesthesia, occ HA with anesthesia    Diabetes mellitus without complication (HCC)    type 2   Difficulty swallowing pills    Dyspraxia    Eczema    Global developmental delay    Obesity (BMI 30-39.9)    PONV (postoperative nausea and vomiting)    Precocious female puberty    Seizures (HCC)    last seizure 2012 - no current med   Sleep apnea    White-Sutton syndrome    Past Surgical History:  Procedure Laterality Date   ADENOIDECTOMY     BALLOON DILATION N/A 11/23/2021   Procedure: BALLOON DILATION;  Surgeon: Tressia Danas, MD;  Location: [facility];  Service: Gastroenterology;  Laterality: N/A;   BIOPSY  07/16/2021   Procedure: BIOPSY;  Surgeon: Tressia Danas, MD;  Location: [facility] END[facility]COPY;  Service: Gastroenterology;;   BIOPSY  11/23/2021   Procedure: BIOPSY;  Surgeon: Tressia Danas, MD;  Location: [facility];  Service: Gastroenterology;;   bone removal Left 01/16/2021   foot   COLONOSCOPY WITH PROPOFOL N/A 07/16/2021   Procedure: COLONOSCOPY WITH PROPOFOL;  Surgeon: Tressia Danas, MD;  Location: [facility] ENDOSCOPY;  Service: Gastroenterology;  Laterality: N/A;    ESOPHAGOGASTRODUODENOSCOPY (EGD) WITH PROPOFOL N/A 11/23/2021   Procedure: ESOPHAGOGASTRODUODENOSCOPY (EGD) WITH PROPOFOL;  Surgeon: Tressia Danas, MD;  Location: [facility];  Service: Gastroenterology;  Laterality: N/A;   GASTROCNEMIUS RECESSION Bilateral 04/25/2020   Procedure: BILATERAL GASTROCNEMIUS RECESSION;  Surgeon: Nadara Mustard, MD;  Location: [facility] OR;  Service: Orthopedics;  Laterality: Bilateral;   SUPPRELIN IMPLANT  05/06/2011   Procedure: SUPPRELIN IMPLANT;  Surgeon: Judie Petit. Leonia Corona, MD;  Location: [facility];  Service: Pediatrics;  Laterality: Right;   SUPPRELIN IMPLANT Right 06/14/2013   Procedure: REMOVAL OF SUPPRELIN IMPLANT FROM RIGHT UPPER ARM;  Surgeon: Judie Petit. Leonia Corona, MD;  Location: [facility];  Service: Pediatrics;  Laterality: Right;   TONSILLECTOMY     TONSILLECTOMY AND ADENOIDECTOMY     TYMPANOSTOMY TUBE PLACEMENT     x 2   Patient Active Problem List   Diagnosis Date Noted   Hyperhidrosis 12/30/2021   Dysphagia    Abdominal pain    OSA (obstructive sleep apnea) 09/30/2021   Bilateral hand numbness 09/15/2021   Acne vulgaris 08/26/2021   Noninfectious gastroenteritis    Bilateral arm pain 06/12/2021   DUB (dysfunctional uterine bleeding) 05/14/2021   Diarrhea 04/24/2021   Dysuria 04/24/2021   Sinusitis 04/24/2021   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   Other adverse food reactions, not elsewhere classified, subsequent  encounter 08/20/2020   Other allergic rhinitis 08/20/2020   Allergic conjunctivitis of both eyes 08/20/2020   Encounter for general adult medical examination with abnormal findings 07/03/2020   Leg length discrepancy 06/05/2020   Hidradenitis suppurativa 06/05/2020   Intellectual disability 06/05/2020   Achilles tendon contracture, bilateral    Elevated sedimentation rate 02/07/2020   Pain in right ankle and joints of right foot 02/07/2020   Anemia 12/29/2018   Type 2 diabetes mellitus (HCC)  08/15/2017   Elevated triglycerides with high cholesterol 12/09/2016   Mild intellectual disability 08/16/2012   Laxity of ligament 08/16/2012   Delayed milestones 08/16/2012   Obesity 06/07/2011   Dyspepsia 02/11/2011   Acanthosis nigricans 02/11/2011   Global developmental delay    Petit mal without grand mal seizures (HCC)    Dyspraxia     ONSET DATE: Birth (06/29/2022 - date of referral)  REFERRING DIAG: Q87.89 (ICD-10-CM) - White-Sutton syndrome M67.01,M67.02 (ICD-10-CM) - Achilles tendon contracture, bilateral  THERAPY DIAG:  Attention and concentration deficit  Other symptoms and signs involving the musculoskeletal system  Other disturbances of skin sensation  Rationale for Evaluation and Treatment: Rehabilitation  SUBJECTIVE:   SUBJECTIVE STATEMENT: Does not use Voltaren on hands, only R foot. She reports she will have surgery on her right foot. She has a follow-up in July with this physician to discuss/schedule surgery.  Her parents are going out of town next week to celebrate their 35th wedding anniversary. Her brother is going to come stay with her during that time.   Per pt's chart, "They want her to get help with overall everyday activities. They want her to get help with managing her pain so that she can prevent further injury - like walking. Also to help her to form daily tasks like cooking; reading; and other items. She has problems with following steps. This is especially true with following through on applying creams and items her dermatologist wants her to do for her sores. She also has issues with keeping on her braces for her feet and putting them on properly even after she has been shown. Following directions is a big one. Measuring items; working on counting money and other mental development items. We are also working on bathing properly; learning to cut with a knife (even with cutting a pizza after she cooks; even a steak and other items like that we cut up  for her.They want her to get help with overall everyday activities."  Pt accompanied by: self - mother drove her to clinic but remains in car during evaluation   PERTINENT HISTORY: Pt was born with White-Sutton Syndrome and was referred to OP OT to help improve independence with ADLs and IADLs. She also has persisting B hand pain, which has prevented her from holding a job and is now on disability. She lives at home but requires supervision and assistance.   PRECAUTIONS: Fall; seizure  WEIGHT BEARING RESTRICTIONS: No  PAIN:  Are you having pain? Yes: NPRS scale: 6/10 Pain location: L wrist Pain description: sharp Aggravating factors: pressure Relieving factors: rest; brace wear; ice  FALLS: Has patient fallen in last 6 months? No  LIVING ENVIRONMENT: Lives with: lives with their family Lives in: House/apartment Stairs: Yes: Internal: 12-13 steps; on right going up Has following equipment at home: Single point cane, Walker - 4 wheeled, and Wheelchair (manual)  PLOF: Independent with basic ADLs; does not drive; was pushing carts at [employer] until 10/2020; is on disability  PATIENT GOALS: She wants to be  able to do a lot to things, especially cutting up her food better and get her hand better.   OBJECTIVE:   HAND DOMINANCE: Left  ADLs: Overall ADLs: mod I  Eating: Requires assistance to cut food   FUNCTIONAL OUTCOME MEASURES: Quick Dash: 31.8% disability with use of BUE   UPPER EXTREMITY ROM:    BUE: WNL  UPPER EXTREMITY MMT:    BUE: WNL  HAND FUNCTION: Gross grip strength WNL  COORDINATION: WNL  SENSATION: Reports paresthesias in R hand dorsally and proximal to 2nd digit  EDEMA: none observed; reports some swelling after yard work  COGNITION: Overall cognitive status: History of cognitive impairments - at baseline Areas of impairment: Areas of impairment: Attention, Memory, Following commands, Safety/judgement, and Problem solving   OBSERVATIONS: Pt  appears well-kept. [name] able to use phone to provide current list of medications. Ambulates without AD. No LOB though altered gait.   TODAY'S TREATMENT:                                                                                                                               N/A this date   PATIENT EDUCATION: Education details: OT Role and POC Person educated: Patient Education method: Explanation Education comprehension: verbalized understanding  HOME EXERCISE PROGRAM: N/A this date  GOALS:  SHORT TERM GOALS: Target date: 09/16/2022    Pt will create daily schedule with assistance of therapist to promote independence and establish healthy routines.  Baseline: Goal status: INITIAL  2.  Pt will complete [facility] Life Skills Checklist and establish appropriate LTG (s).  Baseline:  Goal status: IN PROGRESS  3.  Pt will recall pain management strategies mod I or better.   Baseline:  Goal status: INITIAL    LONG TERM GOALS: Target date: 10/07/2022    Patient will demonstrate UE HEP with 25% verbal cues or less for proper execution. Baseline:  Goal status: INITIAL  2.  Patient will demonstrate at least 8% improvement with quick Dash score (reporting 23.8% disability or less) indicating improved functional use of affected extremity. Baseline: 31.8% disability with use of BUE Goal status: INITIAL  3.  Patient will demonstrate ability to don and doff foot braces. Baseline: Patient dons with error Goal status: INITIAL  ASSESSMENT:  CLINICAL IMPRESSION: Patient is a 22 y.o. female who was seen today for occupational therapy evaluation secondary to White-Sutton syndrome in efforts to improve pt's independence with daily activities. Hx includes adenoidectomy, tonsillectomy and adenoidectomy, global developmental delay, dyspraxia, dyspepsia, mild ID, seizures, DMII, anemia, achilles tendon contractures, chronic B arm pain and numbness, hyperhidrosis, and dysphagia. Pt and family  with support of her psychiatrist are hoping pt can establish healthy routines in efforts to become more independent.   PERFORMANCE DEFICITS: in functional skills including ADLs, IADLs, sensation, pain, endurance, and UE functional use, cognitive skills including attention, memory, problem solving, safety awareness, sequencing, thought, and understand, and psychosocial skills including coping strategies and routines  and behaviors.   IMPAIRMENTS: are limiting patient from ADLs, IADLs, work, and leisure.   COMORBIDITIES: may have co-morbidities  that affects occupational performance. Patient will benefit from skilled OT to address above impairments and improve overall function.  MODIFICATION OR ASSISTANCE TO COMPLETE EVALUATION: Min-Moderate modification of tasks or assist with assess necessary to complete an evaluation.  OT OCCUPATIONAL PROFILE AND HISTORY: Problem focused assessment: Including review of records relating to presenting problem.  CLINICAL DECISION MAKING: LOW - limited treatment options, no task modification necessary  REHAB POTENTIAL: Fair given chronicity of impairments  EVALUATION COMPLEXITY: Low      PLAN:  OT FREQUENCY: 2x/week  OT DURATION: 6 weeks  PLANNED INTERVENTIONS: self care/ADL training, therapeutic exercise, therapeutic activity, manual therapy, functional mobility training, splinting, paraffin, fluidotherapy, moist heat, patient/family education, cognitive remediation/compensation, and Re-evaluation  RECOMMENDED OTHER SERVICES: none at this time  CONSULTED AND AGREED WITH PLAN OF CARE: Patient  PLAN FOR NEXT SESSION: Review [facility] Life Skills Checklist; Voltaren for hands?   Delana Meyer, OT 08/19/2022, 5:02 PM

## 2022-08-19 NOTE — Progress Notes (Addendum)
[facility] Counselor/Therapist Progress Note  Patient ID: Deanna Levine, MRN: [id number],    Date: 08/19/2022  Time Spent: 55 min Caregility video; Pt is in her Family's car in private outside while Parents shop @ [employer] & Provider working remote from [employer]. Pt & Parents are aware of the risks/limitations of the telehealth platform & all 3 Indivs have consented to Tx in the past on Family Th visit.   Treatment Type: Individual Therapy  Reported Symptoms: Reduction in anx/dep & stress  Mental Status Exam: Appearance:  Casual     Behavior: Appropriate and Sharing  Motor: Normal  Speech/Language:  Clear and Coherent  Affect: Appropriate  Mood: normal  Thought process: normal  Thought content:   WNL  Sensory/Perceptual disturbances:   WNL  Orientation: oriented to person, place, and time/date  Attention: Good  Concentration: Good  Memory: WNL  Fund of knowledge:  Good  Insight:   Good  Judgment:  Good  Impulse Control: Fair   Risk Assessment: Danger to Self:  No Self-injurious Behavior: No Danger to Others: No Duty to Warn:no Physical Aggression / Violence:No  Access to Firearms a concern: No  Gang Involvement:No   Subjective: Pt is busy w/her health issues & keeping appts. [name] doing chores & watching the home while Parents are OOT in [place], [place]. Her Deanna Levine is staying w/her while they are away; Sat - Thur. Bros encourages her & gives good advice.   Interventions: Family Systems  Diagnosis:Anxiety  Anxiety about health  Plan: Deanna Levine is worried for her future; she is concerned for her sleep quality. It helps her to roll over & get on her phone. She stays on her phone for 30 mins. She will cont to do this when sleep seems elusive. This coming week, she will not need to have transport for any Dr. Tyrell Levine. She will do her best this next week to manage things @ home w/her Deanna Levine.  Target Date: 09/28/2022  Progress: 7  Frequency: Once  every 3-4 wks  Modality: Deanna Fraise, LMFT

## 2022-08-31 ENCOUNTER — Encounter (INDEPENDENT_AMBULATORY_CARE_PROVIDER_SITE_OTHER): Payer: Self-pay | Admitting: Pediatric Endocrinology

## 2022-08-31 ENCOUNTER — Other Ambulatory Visit (HOSPITAL_COMMUNITY): Payer: Self-pay

## 2022-08-31 ENCOUNTER — Ambulatory Visit (INDEPENDENT_AMBULATORY_CARE_PROVIDER_SITE_OTHER): Payer: 59 | Admitting: Pediatric Endocrinology

## 2022-08-31 VITALS — BP 118/72 | HR 80 | Ht 64.57 in | Wt 177.4 lb

## 2022-08-31 DIAGNOSIS — N938 Other specified abnormal uterine and vaginal bleeding: Secondary | ICD-10-CM | POA: Diagnosis not present

## 2022-08-31 DIAGNOSIS — E119 Type 2 diabetes mellitus without complications: Secondary | ICD-10-CM

## 2022-08-31 DIAGNOSIS — Z7985 Long-term (current) use of injectable non-insulin antidiabetic drugs: Secondary | ICD-10-CM | POA: Diagnosis not present

## 2022-08-31 LAB — POCT GLUCOSE (DEVICE FOR HOME USE): Glucose Fasting, POC: 99 mg/dL (ref 70–99)

## 2022-08-31 LAB — POCT GLYCOSYLATED HEMOGLOBIN (HGB A1C): Hemoglobin A1C: 5.1 % (ref 4.0–5.6)

## 2022-08-31 MED ORDER — MOUNJARO 12.5 MG/0.5ML ~~LOC~~ SOAJ
12.5000 mg | SUBCUTANEOUS | 2 refills | Status: DC
  Filled 2022-08-31 – 2022-10-01 (×2): qty 2, 28d supply, fill #0
  Filled 2022-11-04: qty 2, 28d supply, fill #1
  Filled 2022-12-06: qty 2, 28d supply, fill #2

## 2022-08-31 MED ORDER — CLINDAMYCIN PHOS-BENZOYL PEROX 1.2-5 % EX GEL: CUTANEOUS | 3 refills | Status: DC

## 2022-08-31 NOTE — Patient Instructions (Signed)
Use Benzaclin for your sore. Overall it looks like it is healing well.

## 2022-08-31 NOTE — Progress Notes (Signed)
Subjective:  Subjective  Patient Name: Deanna Levine Date of Birth: 06-Oct-2000  MRN: [id number]  Deanna Levine  presents to clinic today for follow-up evaluation and management of her  obesity, prediabetes, acanthosis   HISTORY OF PRESENT ILLNESS:   [name] is a 22 y.o. AA female   Radhika is accompanied by mom   1. [name] was initially followed in pediatric endocrine clinic for early puberty complicated by profound developmental delay. [name] now post menarchal and follows in clinic for Type 2 Diabetes and metabolic syndrome.    2. The patient's last [facility] visit was on 02/18/22  [name] still taking 12.5 mg of Mounjaro once a week. Mom has a network of folks around the state and they check the [facility] by everyone until they find a store that has it in stock and then they fill it there. She starts calling half way through the month (when she has 2 pens remaining).   She has Medicaid secondary and [facility]. She will get Medicare in November.   [name] helping her parents around the house and volunteering at the [employer] on Wednesdays. [name] still not allowed to stand for long periods of time.   She will need to have surgery on her right foot this summer. She had her left foot done about a year ago. [name] not currently able to walk as much due to pain.   She has continued to need to get new clothes. She has decreased her bra size from a DD to a C cup.   [name] not able to have a job. [name] now on disability.   Her derm had given her Spironolactone but had her stop taking it.  She has a lot of new sores currently. Per mom the wound center wanted her to go to [facility] but they had a wait list over a year.   She will helping with her cousin's soft ball coach at [employer] this fall.   She has continued to have heavy periods. [name] having them every month and they are lasting about 7 days.  [name] seeing GYN and had a PAP smear under sedation.    [name] now seeing a  therapist virtually. [name] seeing them about every other week.    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     3. Pertinent Review of Systems:  Constitutional: The patient feels "good". The patient seems healthy and active. Eyes: Vision seems to be good. There are no recognized eye problems. Wears glasses.  Neck: The patient has no complaints of anterior neck swelling, soreness, tenderness, pressure, discomfort, or difficulty swallowing.   Heart: Heart rate increases with exercise or other physical activity. The patient has no complaints of palpitations, irregular heart beats, chest pain, or chest pressure.   Lungs: no asthma or wheezing.  Gastrointestinal: Bowel movents seem normal. The patient has no complaints of excessive hunger, acid reflux, upset stomach, stomach aches or pains, diarrhea. Some constipation. [name] seeing a new GI doc for increased gas.  Legs: Muscle mass and strength seem normal. There are no complaints of numbness, tingling, burning, or pain. No edema is noted.  Feet: Per HPI Neurologic: Has been having staring spells. [name] followed by Dr. Allena Katz in adult neurology GYN/GU: Menarche 07/2014, age 65. LMP 6/24.     PAST MEDICAL, FAMILY, AND SOCIAL HISTORY  Past Medical History:  Diagnosis Date   Absence seizure disorder (HCC)    Allergy    Colitis 07/16/2021   Complication of anesthesia    slow to wake up from anesthesia, occ HA with anesthesia    Diabetes mellitus without complication (HCC)    type 2   Difficulty swallowing pills    Dyspraxia    Eczema    Global developmental delay    Obesity (BMI 30-39.9)    PONV (postoperative nausea and vomiting)    Precocious female puberty    Seizures (HCC)    last seizure 2012 - no current med   Sleep apnea    White-Sutton syndrome     Family History  Problem Relation Age of Onset   Diabetes Mother        steroid induced; hx.  colitis   Anesthesia problems Mother        severe headache lasting 2-3 days   Rheum arthritis Mother    Ulcerative colitis Mother    Fibromyalgia Mother    Henoch-Schonlein purpura Brother        in remission   Hypertension Maternal Grandmother    Hyperlipidemia Maternal Grandmother    Diabetes Maternal Grandmother    Kidney disease Maternal Grandfather    Hypertension Maternal Grandfather    Heart disease Maternal Grandfather    Hyperlipidemia Maternal Grandfather    Kidney disease Paternal Grandmother    Hypertension Paternal Grandmother    Diabetes Paternal Grandmother    Heart disease Paternal Grandmother    Hyperlipidemia Paternal Grandmother    Hypertension Paternal Grandfather    Hyperlipidemia Paternal Grandfather    Asthma Maternal Aunt    Seizures Maternal Uncle    Colon cancer Other        great aunt   Esophageal cancer Neg Hx    Esophageal varices Neg Hx    Pancreatic disease Neg Hx      Current Outpatient Medications:    acetaminophen (TYLENOL) 160 MG/5ML elixir, Take 960 mg by mouth every 4 (four) hours as needed (headache)., Disp: , Rfl:    azelastine (ASTELIN) 0.1 % nasal spray, Place 1 spray into both nostrils 2 (two) times daily. Use in each nostril as directed, Disp: 30 mL, Rfl: 12   cetirizine (ZYRTEC) 10 MG tablet, Take 10 mg by mouth daily as needed for allergies., Disp: , Rfl:    chlorhexidine (HIBICLENS) 4 % external liquid, Apply topically daily as needed., Disp: 120 mL, Rfl: 0   Cholecalciferol (VITAMIN D3 GUMMIES ADULT PO), Take 2 tablets by mouth daily., Disp: , Rfl:    Clindamycin-Benzoyl Per, Refr, gel, Use as directed up to twice a day, Disp: 45 g, Rfl: 3   diclofenac Sodium (VOLTAREN) 1 % GEL, Apply 4 g topically 4 (four) times daily., Disp: 100 g, Rfl: 4   dicyclomine (BENTYL) 20 MG tablet, Take a half tablet 4 times daily before meals., Disp: 60 tablet, Rfl: 3   fluticasone (FLONASE) 50 MCG/ACT nasal spray, SHAKE LIQUID AND USE 2 SPRAYS IN  EACH NOSTRIL DAILY, Disp: 48 g, Rfl: 1   ibuprofen (ADVIL) 200 MG tablet, Take 600 mg by mouth every 6 (six) hours as needed for moderate pain., Disp: , Rfl:    meloxicam (MOBIC) 15 MG tablet, Take 15 mg by mouth daily., Disp: , Rfl:    Menaquinone-7 (VITAMIN K2) 100 MCG CAPS, Take 100 mcg by mouth daily., Disp: , Rfl:    mesalamine (PENTASA) 250 MG CR capsule, Take 4 capsules (1,000  mg total) by mouth 3 (three) times daily., Disp: 360 capsule, Rfl: 5   Metamucil Fiber CHEW, Chew 1 tablet by mouth daily., Disp: , Rfl:    Na Sulfate-K Sulfate-Mg Sulf 17.5-3.13-1.6 GM/177ML SOLN, See admin instructions., Disp: , Rfl:    ondansetron (ZOFRAN-ODT) 4 MG disintegrating tablet, Take 1 tablet (4 mg total) by mouth every 8 (eight) hours as needed for nausea or vomiting., Disp: 30 tablet, Rfl: 3   Propylene Glycol (SYSTANE BALANCE OP), Apply to eye., Disp: , Rfl:    tirzepatide (MOUNJARO) 12.5 MG/0.5ML Pen, INJECT 12.5 MG UNDER THE SKIN ONCE A WEEK, Disp: 6 mL, Rfl: 1   tirzepatide (MOUNJARO) 12.5 MG/0.5ML Pen, Inject 12.5 mg into the skin once a week., Disp: 2 mL, Rfl: 2   triamcinolone cream (KENALOG) 0.1 %, Apply to rough area of feet twice daily., Disp: 454 g, Rfl: 0   zonisamide (ZONEGRAN) 100 MG capsule, Take 2 capsules daily, Disp: 180 capsule, Rfl: 3  Allergies as of 08/31/2022   (No Known Allergies)     reports that she has never smoked. She has never been exposed to tobacco smoke. She has never used smokeless tobacco. She reports that she does not drink alcohol and does not use drugs. Pediatric History  Patient Parents   Ziporah, Daddio (Mother)   Carolin Sicks Jr,Clifton (Father)   Other Topics Concern   Not on file  Social History Narrative   Jezell is graduated. [name] doing average.    She enjoys playing softball and basketball.   Lives with her parents.    Not working.   Left handed   Two story home      Had surgery on her legs in Feb and is physical therapy twice a week.   Left  handed   No caffeine   Unable to work. Now has disability.   Primary Care Provider: Myrlene Broker, MD     Objective:  Objective   Vital Signs:     BP 118/72   Pulse 80   Ht 5' 4.57" (1.64 m)   Wt 177 lb 6 oz (80.5 kg)   BMI 29.91 kg/m  Growth %ile SmartLinks can only be used for patients less than 44 years old.    Ht Readings from Last 3 Encounters:  08/31/22 5' 4.57" (1.64 m)  07/05/22 5\' 4"  (1.626 m)  04/26/22 5\' 4"  (1.626 m)   Wt Readings from Last 3 Encounters:  08/31/22 177 lb 6 oz (80.5 kg)  07/05/22 179 lb (81.2 kg)  06/01/22 180 lb 9.6 oz (81.9 kg)   HC Readings from Last 3 Encounters:  07/30/21 26.85" (68.2 cm)   Body surface area is 1.92 meters squared. Facility age limit for growth %iles is 20 years. Facility age limit for growth %iles is 20 years.   PHYSICAL EXAM:     Constitutional: The patient appears healthy and well nourished. The patient's height and weight are advanced for age.  She has lost 3 pounds since last visit.  Head: The head is normocephalic. Face: The face appears normal. There are no obvious dysmorphic features. Eyes: The eyes appear to be normally formed and spaced. Gaze is conjugate. There is no obvious arcus or proptosis. Moisture appears normal. Ears: The ears are normally placed and appear externally normal. Mouth: The oropharynx and tongue appear normal. Dentition appears to be normal for age. Oral moisture is normal. Neck: The neck appears to be visibly normal. The thyroid gland is 13 grams in size. The consistency of  the thyroid gland is normal. The thyroid gland is not tender to palpation. +1 acanthosis with scaling  Lungs: no increased work of breathing Heart: regular pulses and peripheral perfusion Abdomen: The abdomen appears to be enlarged in size for the patient's age. There is no obvious hepatomegaly, splenomegaly, or other mass effect.  Arms: Muscle size and bulk are normal for age. Axillary acanthosis and  hydradenitis  Hands: There is no obvious tremor. Palmar muscles are normal for age. Palmar skin is normal. Palmar moisture is also normal. Swelling noted into proximal phalanges  Legs: Muscles appear normal for age. No edema is present.  Neurologic: Strength is normal for age in both the upper and lower extremities. Muscle tone is normal. Sensation to touch is normal in both the legs and feet.   GYN/GU: normal female Skin: She has multiple scars and a few open lesions under her breasts. She has scarring and one current open lesion under her right breast   LAB DATA:    Lab Results  Component Value Date   HGBA1C 5.1 08/31/2022   HGBA1C 5.1 06/01/2022   HGBA1C 6.4 02/18/2022   HGBA1C 5.9 11/24/2021   HGBA1C 6.3 (H) 08/24/2021   HGBA1C 6.4 02/12/2021   HGBA1C 6.3 (A) 11/13/2020   HGBA1C 6.3 (A) 07/24/2020    Results for orders placed or performed in visit on 08/31/22  POCT Glucose (Device for Home Use)  Result Value Ref Range   Glucose Fasting, POC 99 70 - 99 mg/dL   POC Glucose    POCT glycosylated hemoglobin (Hb A1C)  Result Value Ref Range   Hemoglobin A1C 5.1 4.0 - 5.6 %   HbA1c POC (<> result, manual entry)     HbA1c, POC (prediabetic range)     HbA1c, POC (controlled diabetic range)          Assessment and Plan:  Assessment  ASSESSMENT:   Nevaya is a 22 y.o. AA female with history of precocious puberty (which increases risk of T2DM, PCOS, Metabolic syndrome) who now meets criteria for type 2 diabetes    Type 2 diabetes  - Diagnosed in January 2021 - A1C has been stable and within target of <6.5% - Currently on Mounjaro 12.5 mg.weekly.  - Has had continued, slow, weight loss - Annual labs done April 2024   DUB  - She no longer has a Nexplanon in place - [name] no longer taking OCP - [name] having heavy menses about once a month- but GYN has not been able to find a good solution.   Disability - She now has permanent disability status.    PLAN:   1.  Diagnostic: Orders Placed This Encounter  Procedures   POCT Glucose (Device for Home Use)   POCT glycosylated hemoglobin (Hb A1C)   COLLECTION CAPILLARY BLOOD SPECIMEN    2. Therapeutic: Continue Mounjaro 12.5 mg weekly   3. Patient education: Lengthy discussion as above. Also discussed that I will be leaving [place] this fall.  4. Follow-up: No follow-ups on file.    [name] scheduled to see adult endocrinology later this month.   Dessa Phi, MD  Level of Service: >40 minutes spent today reviewing the medical chart, counseling the patient/family, and documenting today's encounter.

## 2022-09-01 ENCOUNTER — Ambulatory Visit: Payer: 59 | Admitting: Occupational Therapy

## 2022-09-03 ENCOUNTER — Encounter (INDEPENDENT_AMBULATORY_CARE_PROVIDER_SITE_OTHER): Payer: Self-pay

## 2022-09-06 ENCOUNTER — Ambulatory Visit: Payer: 59 | Attending: Internal Medicine | Admitting: Occupational Therapy

## 2022-09-06 DIAGNOSIS — Z741 Need for assistance with personal care: Secondary | ICD-10-CM

## 2022-09-06 DIAGNOSIS — R29898 Other symptoms and signs involving the musculoskeletal system: Secondary | ICD-10-CM | POA: Insufficient documentation

## 2022-09-06 DIAGNOSIS — R41840 Attention and concentration deficit: Secondary | ICD-10-CM

## 2022-09-06 DIAGNOSIS — M6281 Muscle weakness (generalized): Secondary | ICD-10-CM | POA: Diagnosis present

## 2022-09-06 DIAGNOSIS — M79642 Pain in left hand: Secondary | ICD-10-CM | POA: Diagnosis present

## 2022-09-06 DIAGNOSIS — R41844 Frontal lobe and executive function deficit: Secondary | ICD-10-CM | POA: Insufficient documentation

## 2022-09-06 DIAGNOSIS — R278 Other lack of coordination: Secondary | ICD-10-CM

## 2022-09-06 NOTE — Therapy (Unsigned)
OUTPATIENT OCCUPATIONAL THERAPY TREATMENT  Patient Name: Deanna Levine MRN: [id number] DOB:04-Aug-2000, 22 y.o., female Today's Date: 09/06/2022  PCP: Myrlene Broker, MD  REFERRING PROVIDER: Myrlene Broker, MD  END OF SESSION:  OT End of Session - 09/06/22 0929     Visit Number 2    Number of Visits 13    Date for OT Re-Evaluation 10/07/22    Authorization Type [employer] Medicaid - no auth required (21 VL)    OT Start Time 0930    OT Stop Time 1014    OT Time Calculation (min) 44 min    Activity Tolerance Patient tolerated treatment well    Behavior During Therapy WFL for tasks assessed/performed             Past Medical History:  Diagnosis Date   Absence seizure disorder (HCC)    Allergy    Colitis 07/16/2021   Complication of anesthesia    slow to wake up from anesthesia, occ HA with anesthesia    Diabetes mellitus without complication (HCC)    type 2   Difficulty swallowing pills    Dyspraxia    Eczema    Global developmental delay    Obesity (BMI 30-39.9)    PONV (postoperative nausea and vomiting)    Precocious female puberty    Seizures (HCC)    last seizure 2012 - no current med   Sleep apnea    White-Sutton syndrome    Past Surgical History:  Procedure Laterality Date   ADENOIDECTOMY     BALLOON DILATION N/A 11/23/2021   Procedure: BALLOON DILATION;  Surgeon: Tressia Danas, MD;  Location: [facility];  Service: Gastroenterology;  Laterality: N/A;   BIOPSY  07/16/2021   Procedure: BIOPSY;  Surgeon: Tressia Danas, MD;  Location: [facility]COPY;  Service: Gastroenterology;;   BIOPSY  11/23/2021   Procedure: BIOPSY;  Surgeon: Tressia Danas, MD;  Location: [facility];  Service: Gastroenterology;;   bone removal Left 01/16/2021   foot   COLONOSCOPY WITH PROPOFOL N/A 07/16/2021   Procedure: COLONOSCOPY WITH PROPOFOL;  Surgeon: Tressia Danas, MD;  Location: [facility] ENDOSCOPY;  Service: Gastroenterology;  Laterality: N/A;    ESOPHAGOGASTRODUODENOSCOPY (EGD) WITH PROPOFOL N/A 11/23/2021   Procedure: ESOPHAGOGASTRODUODENOSCOPY (EGD) WITH PROPOFOL;  Surgeon: Tressia Danas, MD;  Location: [facility];  Service: Gastroenterology;  Laterality: N/A;   GASTROCNEMIUS RECESSION Bilateral 04/25/2020   Procedure: BILATERAL GASTROCNEMIUS RECESSION;  Surgeon: Nadara Mustard, MD;  Location: [facility] OR;  Service: Orthopedics;  Laterality: Bilateral;   SUPPRELIN IMPLANT  05/06/2011   Procedure: SUPPRELIN IMPLANT;  Surgeon: Judie Petit. Leonia Corona, MD;  Location: [facility];  Service: Pediatrics;  Laterality: Right;   SUPPRELIN IMPLANT Right 06/14/2013   Procedure: REMOVAL OF SUPPRELIN IMPLANT FROM RIGHT UPPER ARM;  Surgeon: Judie Petit. Leonia Corona, MD;  Location: [facility];  Service: Pediatrics;  Laterality: Right;   TONSILLECTOMY     TONSILLECTOMY AND ADENOIDECTOMY     TYMPANOSTOMY TUBE PLACEMENT     x 2   Patient Active Problem List   Diagnosis Date Noted   Hyperhidrosis 12/30/2021   Dysphagia    Abdominal pain    OSA (obstructive sleep apnea) 09/30/2021   Bilateral hand numbness 09/15/2021   Acne vulgaris 08/26/2021   Noninfectious gastroenteritis    Bilateral arm pain 06/12/2021   DUB (dysfunctional uterine bleeding) 05/14/2021   Diarrhea 04/24/2021   Dysuria 04/24/2021   Sinusitis 04/24/2021   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   Other adverse food reactions, not elsewhere classified, subsequent  encounter 08/20/2020   Other allergic rhinitis 08/20/2020   Allergic conjunctivitis of both eyes 08/20/2020   Encounter for general adult medical examination with abnormal findings 07/03/2020   Leg length discrepancy 06/05/2020   Hidradenitis suppurativa 06/05/2020   Intellectual disability 06/05/2020   Achilles tendon contracture, bilateral    Elevated sedimentation rate 02/07/2020   Pain in right ankle and joints of right foot 02/07/2020   Anemia 12/29/2018   Type 2 diabetes mellitus (HCC)  08/15/2017   Elevated triglycerides with high cholesterol 12/09/2016   Mild intellectual disability 08/16/2012   Laxity of ligament 08/16/2012   Delayed milestones 08/16/2012   Obesity 06/07/2011   Dyspepsia 02/11/2011   Acanthosis nigricans 02/11/2011   Global developmental delay    Petit mal without grand mal seizures (HCC)    Dyspraxia     ONSET DATE: Birth (06/29/2022 - date of referral)  REFERRING DIAG: Q87.89 (ICD-10-CM) - White-Sutton syndrome M67.01,M67.02 (ICD-10-CM) - Achilles tendon contracture, bilateral  THERAPY DIAG:  Attention and concentration deficit  Other lack of coordination  Muscle weakness (generalized)  Need for assistance with personal care  Rationale for Evaluation and Treatment: Rehabilitation  SUBJECTIVE:   SUBJECTIVE STATEMENT:  Patient states she wears her hand splints and they help her hands.  [name] encouraged to bring them for OT to see. Pt accompanied by: self - father was here to pick her up   PERTINENT HISTORY: Pt was born with White-Sutton Syndrome and was referred to OP OT to help improve independence with ADLs and IADLs. She also has persisting B hand pain, which has prevented her from holding a job and is now on disability. She lives at home but requires supervision and assistance.   Per pt's chart, "They want her to get help with overall everyday activities. They want her to get help with managing her pain so that she can prevent further injury - like walking. Also to help her to form daily tasks like cooking; reading; and other items. She has problems with following steps. This is especially true with following through on applying creams and items her dermatologist wants her to do for her sores. She also has issues with keeping on her braces for her feet and putting them on properly even after she has been shown. Following directions is a big one. Measuring items; working on counting money and other mental development items. We are also  working on bathing properly; learning to cut with a knife (even with cutting a pizza after she cooks; even a steak and other items like that we cut up for her.They want her to get help with overall everyday activities."   PRECAUTIONS: Fall; seizure  WEIGHT BEARING RESTRICTIONS: No  PAIN:  Are you having pain? Yes: NPRS scale: 6/10 Pain location: L wrist Pain description: sharp Aggravating factors: pressure Relieving factors: rest; brace wear; ice  FALLS: Has patient fallen in last 6 months? 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   OBJECTIVE:   HAND DOMINANCE: Left  ADLs: Overall ADLs: mod I  Eating: Requires assistance to cut food  FUNCTIONAL OUTCOME MEASURES: Evaluation 08/19/22: Quick Dash: 31.8% disability with use of BUE  UPPER EXTREMITY ROM:    BUE: WNL  UPPER EXTREMITY MMT:    BUE: WNL  HAND FUNCTION: Gross grip strength WNL  COORDINATION: WNL  SENSATION: Reports paresthesias in R hand dorsally and proximal to 2nd digit  EDEMA: none observed; reports some swelling after yard work  COGNITION: Overall cognitive status: History of cognitive impairments - at baseline Areas of impairment: Areas of impairment: Attention, Memory, Following commands, Safety/judgement, and Problem solving   OBSERVATIONS: Pt appears well-kept. [name] able to use phone to provide current list of medications. Ambulates without AD. No LOB though altered gait.   TODAY'S TREATMENT:                                                                                                                                Self Care:  Reviewed [facility] Life Skills Checklist  Practiced self-care activities reviewed including using fork and knife ie) for cutting putty for simulated meal task.  Patient does have an awkward grasp of fork during cutting activities and hand overhand guidance is given to improve cutting.  Reviewed cooking activities at home that patient currently conducts ie) using microwave and occasionally stove/oven but also activities she would like to be able to do.    Reviewed possible use of visual recipes.  Patient worked on opening containers and when lids are already loosened she was able to open various plastic containers even after therapist tightened them snugly.   PATIENT EDUCATION: Education details: OT POC considerations for goals and activities. Person educated: Patient Education method: Explanation Education comprehension: verbalized understanding  HOME EXERCISE PROGRAM: N/A this date  GOALS:  SHORT TERM GOALS: Target date: 09/16/2022    Pt will create daily schedule with assistance of therapist to promote independence and establish healthy routines.  Baseline: Goal status: IN PROGRESS  2.  Pt will complete [facility] Life Skills Checklist and establish appropriate LTG (s).  Baseline:  Goal status: IN PROGRESS  3.  Pt will recall pain management strategies mod I or better.   Baseline:  Goal status: INITIAL    LONG TERM GOALS: Target date: 10/07/2022    Patient will demonstrate UE HEP with 25% verbal cues or less for proper execution. Baseline:  Goal status: INITIAL  2.  Patient will demonstrate at least 8% improvement with quick Dash score (reporting 23.8% disability or less) indicating improved functional use of affected extremity. Baseline: 31.8% disability with use of BUE Goal status: INITIAL  3.  Patient will demonstrate ability to don and doff foot braces. Baseline: Patient dons with error Goal status:  INITIAL  ASSESSMENT:  CLINICAL IMPRESSION: Patient is a 22 y.o. female who was seen today for occupational therapy visit today secondary to White-Sutton syndrome in efforts to improve pt's independence with daily activities.  She reports independent participation in oral care but was noted to be in need of  such today.  She does have a varied schedule at time and would benefit from some daily activities to participate in to minimize late night activities and day time sleeping.  Skilled OT services are appropriate to establish healthy routines, identify AE and activities to promote increased independence for patient.   PERFORMANCE DEFICITS: in functional skills including ADLs, IADLs, sensation, pain, endurance, and UE functional use, cognitive skills including attention, memory, problem solving, safety awareness, sequencing, thought, and understand, and psychosocial skills including coping strategies and routines and behaviors.   IMPAIRMENTS: are limiting patient from ADLs, IADLs, work, and leisure.   COMORBIDITIES: may have co-morbidities  that affects occupational performance. Patient will benefit from skilled OT to address above impairments and improve overall function.  REHAB POTENTIAL: Fair given chronicity of impairments  PLAN:  OT FREQUENCY: 2x/week  OT DURATION: 6 weeks  PLANNED INTERVENTIONS: self care/ADL training, therapeutic exercise, therapeutic activity, manual therapy, functional mobility training, splinting, paraffin, fluidotherapy, moist heat, patient/family education, cognitive remediation/compensation, and Re-evaluation  RECOMMENDED OTHER SERVICES: none at this time  CONSULTED AND AGREED WITH PLAN OF CARE: Patient  PLAN FOR NEXT SESSION:   Picture recipes Opening tight jars Setting schedule Counting Money  Continue to review [facility] Life Skills Checklist for daily routine and OT goals.  Pain management - Voltaren for hands?  Victorino Sparrow, OT 09/06/2022, 4:36 PM

## 2022-09-07 ENCOUNTER — Ambulatory Visit: Payer: 59 | Admitting: Occupational Therapy

## 2022-09-07 DIAGNOSIS — M79642 Pain in left hand: Secondary | ICD-10-CM

## 2022-09-07 DIAGNOSIS — R41844 Frontal lobe and executive function deficit: Secondary | ICD-10-CM

## 2022-09-07 DIAGNOSIS — Z741 Need for assistance with personal care: Secondary | ICD-10-CM

## 2022-09-07 DIAGNOSIS — R41840 Attention and concentration deficit: Secondary | ICD-10-CM | POA: Diagnosis not present

## 2022-09-07 DIAGNOSIS — R29898 Other symptoms and signs involving the musculoskeletal system: Secondary | ICD-10-CM

## 2022-09-07 NOTE — Therapy (Signed)
OUTPATIENT OCCUPATIONAL THERAPY TREATMENT  Patient Name: Deanna Levine MRN: [id number] DOB:May 02, 2000, 22 y.o., female Today's Date: 09/07/2022  PCP: Myrlene Broker, MD  REFERRING PROVIDER: Myrlene Broker, MD  END OF SESSION:  OT End of Session - 09/07/22 0759     Visit Number 3    Number of Visits 13    Date for OT Re-Evaluation 10/07/22    Authorization Type [employer] - no auth required (21 VL)    OT Start Time 0800    OT Stop Time 0845    OT Time Calculation (min) 45 min    Equipment Utilized During Treatment Jar opener    Activity Tolerance Patient tolerated treatment well    Behavior During Therapy WFL for tasks assessed/performed             Past Medical History:  Diagnosis Date   Absence seizure disorder (HCC)    Allergy    Colitis 07/16/2021   Complication of anesthesia    slow to wake up from anesthesia, occ HA with anesthesia    Diabetes mellitus without complication (HCC)    type 2   Difficulty swallowing pills    Dyspraxia    Eczema    Global developmental delay    Obesity (BMI 30-39.9)    PONV (postoperative nausea and vomiting)    Precocious female puberty    Seizures (HCC)    last seizure 2012 - no current med   Sleep apnea    White-Sutton syndrome    Past Surgical History:  Procedure Laterality Date   ADENOIDECTOMY     BALLOON DILATION N/A 11/23/2021   Procedure: BALLOON DILATION;  Surgeon: Tressia Danas, MD;  Location: [facility];  Service: Gastroenterology;  Laterality: N/A;   BIOPSY  07/16/2021   Procedure: BIOPSY;  Surgeon: Tressia Danas, MD;  Location: [facility] END[facility]COPY;  Service: Gastroenterology;;   BIOPSY  11/23/2021   Procedure: BIOPSY;  Surgeon: Tressia Danas, MD;  Location: [facility];  Service: Gastroenterology;;   bone removal Left 01/16/2021   foot   COLONOSCOPY WITH PROPOFOL N/A 07/16/2021   Procedure: COLONOSCOPY WITH PROPOFOL;  Surgeon: Tressia Danas, MD;  Location: [facility] ENDOSCOPY;  Service:  Gastroenterology;  Laterality: N/A;   ESOPHAGOGASTRODUODENOSCOPY (EGD) WITH PROPOFOL N/A 11/23/2021   Procedure: ESOPHAGOGASTRODUODENOSCOPY (EGD) WITH PROPOFOL;  Surgeon: Tressia Danas, MD;  Location: [facility];  Service: Gastroenterology;  Laterality: N/A;   GASTROCNEMIUS RECESSION Bilateral 04/25/2020   Procedure: BILATERAL GASTROCNEMIUS RECESSION;  Surgeon: Nadara Mustard, MD;  Location: [facility] OR;  Service: Orthopedics;  Laterality: Bilateral;   SUPPRELIN IMPLANT  05/06/2011   Procedure: SUPPRELIN IMPLANT;  Surgeon: Judie Petit. Leonia Corona, MD;  Location: [facility];  Service: Pediatrics;  Laterality: Right;   SUPPRELIN IMPLANT Right 06/14/2013   Procedure: REMOVAL OF SUPPRELIN IMPLANT FROM RIGHT UPPER ARM;  Surgeon: Judie Petit. Leonia Corona, MD;  Location: [facility];  Service: Pediatrics;  Laterality: Right;   TONSILLECTOMY     TONSILLECTOMY AND ADENOIDECTOMY     TYMPANOSTOMY TUBE PLACEMENT     x 2   Patient Active Problem List   Diagnosis Date Noted   Hyperhidrosis 12/30/2021   Dysphagia    Abdominal pain    OSA (obstructive sleep apnea) 09/30/2021   Bilateral hand numbness 09/15/2021   Acne vulgaris 08/26/2021   Noninfectious gastroenteritis    Bilateral arm pain 06/12/2021   DUB (dysfunctional uterine bleeding) 05/14/2021   Diarrhea 04/24/2021   Dysuria 04/24/2021   Sinusitis 04/24/2021   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  Other adverse food reactions, not elsewhere classified, subsequent encounter 08/20/2020   Other allergic rhinitis 08/20/2020   Allergic conjunctivitis of both eyes 08/20/2020   Encounter for general adult medical examination with abnormal findings 07/03/2020   Leg length discrepancy 06/05/2020   Hidradenitis suppurativa 06/05/2020   Intellectual disability 06/05/2020   Achilles tendon contracture, bilateral    Elevated sedimentation rate 02/07/2020   Pain in right ankle and joints of right foot 02/07/2020   Anemia 12/29/2018    Type 2 diabetes mellitus (HCC) 08/15/2017   Elevated triglycerides with high cholesterol 12/09/2016   Mild intellectual disability 08/16/2012   Laxity of ligament 08/16/2012   Delayed milestones 08/16/2012   Obesity 06/07/2011   Dyspepsia 02/11/2011   Acanthosis nigricans 02/11/2011   Global developmental delay    Petit mal without grand mal seizures (HCC)    Dyspraxia     ONSET DATE: Birth (06/29/2022 - date of referral)  REFERRING DIAG: Q87.89 (ICD-10-CM) - White-Sutton syndrome M67.01,M67.02 (ICD-10-CM) - Achilles tendon contracture, bilateral  THERAPY DIAG:  Need for assistance with personal care  Frontal lobe and executive function deficit  Other symptoms and signs involving the musculoskeletal system  Pain in left hand  Rationale for Evaluation and Treatment: Rehabilitation  SUBJECTIVE:   SUBJECTIVE STATEMENT:  Patient wore her hand splints today and has bilateral ProCore wrist cock up splints in place upon arrival.  She has had them since 2023 by her report and they help her wrists and her hands feel better.  Patient reports that she and her mom will start back in the [employer] tomorrow and that they do that regularly on Wednesdays.     Pt accompanied by: self - father was here in the lobby and present to pick her up   PERTINENT HISTORY: Pt was born with White-Sutton Syndrome and was referred to OP OT to help improve independence with ADLs and IADLs. She also has persisting B hand pain, which has prevented her from holding a job and is now on disability. She lives at home but requires supervision and assistance.   Per pt's chart, "They want her to get help with overall everyday activities. They want her to get help with managing her pain so that she can prevent further injury - like walking. Also to help her to form daily tasks like cooking; reading; and other items. She has problems with following steps. This is especially true with following through on  applying creams and items her dermatologist wants her to do for her sores. She also has issues with keeping on her braces for her feet and putting them on properly even after she has been shown. Following directions is a big one. Measuring items; working on counting money and other mental development items. We are also working on bathing properly; learning to cut with a knife (even with cutting a pizza after she cooks; even a steak and other items like that we cut up for her.They want her to get help with overall everyday activities."   PRECAUTIONS: Fall; seizure  WEIGHT BEARING RESTRICTIONS: No  PAIN:  Are you having pain? Yes: NPRS scale: 7/10 Pain location: B wrist and thumb  Pain description: aching Aggravating factors: pressure Relieving factors: rest; patient will wear her braces during the day  FALLS: Has patient fallen in last 6 months? No  LIVING ENVIRONMENT: Lives with: lives with their family Lives in: House/apartment Stairs: Yes: Internal: 12-13 steps; on right going up Has following equipment at home: Single point cane,  Walker - 4 wheeled, and Wheelchair (manual)  PLOF: Independent with basic ADLs; does not drive; was pushing carts at [facility] until 10/2020; is on disability  PATIENT GOALS: She wants to be able to do a lot to things, especially cutting up her food better and get her hand better.   OBJECTIVE:   HAND DOMINANCE: Left  ADLs: Overall ADLs: mod I  Eating: Requires assistance to cut food   FUNCTIONAL OUTCOME MEASURES: Evaluation 08/19/22: Neldon Mc: 31.8% disability with use of BUE  UPPER EXTREMITY ROM:    BUE: WNL  UPPER EXTREMITY MMT:    BUE: WNL  HAND FUNCTION: Gross grip strength WNL  COORDINATION: WNL  SENSATION: Reports paresthesias in R hand dorsally and proximal to 2nd digit  EDEMA: none observed; reports some swelling after yard work  COGNITION: Overall cognitive status: History of cognitive impairments - at baseline Areas of  impairment: Areas of impairment: Attention, Memory, Following commands, Safety/judgement, and Problem solving   OBSERVATIONS: Pt appears well-kept. [name] able to use phone to provide current list of medications. Ambulates without AD. No LOB though altered gait.   TODAY'S TREATMENT:                                                                                                                               Self Care:  Patient worked on opening containers and was able to open 2 new drink containers today without modifications.  She brought her jar opener from home but it was a bit small for her to use and had increased success with gripper pad to open glass pickle jar lid.  Laundry activity was going to be completed but clothes were still damp and session changed to focus on money management.  [name] able to identify quarter by the back image [facility]) but needed more cues to identify it from the front ie) based on size - largest coin).  She labelled 45/50 coins correctly and added 4-6 coins (x9 groupings) correctly 7/9 times.  She was able to identify grouping that had the most money and was provided a money sorting page to work on sorting money at home.  She was encouraged to take random amounts of change, sort the money matching the same coins and then adding up the total amount of money (using her phone camera to take a picture for verification of correct completion and adding amounts).   PATIENT EDUCATION: Education details: Coin identification, sorting and adding. Person educated: Patient Education method: Explanation, Demonstration, Verbal cues, and Handouts Education comprehension: verbalized understanding, returned demonstration, verbal cues required, and needs further education  HOME EXERCISE PROGRAM: N/A this date  GOALS:  SHORT TERM GOALS: Target date: 09/16/2022    Pt will create daily schedule with assistance of therapist to promote independence and establish healthy routines.   Baseline: Goal status: IN PROGRESS  2.  Pt will complete [employer] Life Skills Checklist and establish appropriate LTG (s).  Baseline:  Goal status: IN PROGRESS  3.  Pt will recall pain management strategies mod I or better.   Baseline:  Goal status: IN PROGRESS    LONG TERM GOALS: Target date: 10/07/2022    Patient will demonstrate UE HEP with 25% verbal cues or less for proper execution. Baseline:  Goal status: INITIAL  2.  Patient will demonstrate at least 8% improvement with quick Dash score (reporting 23.8% disability or less) indicating improved functional use of affected extremity. Baseline: 31.8% disability with use of BUE Goal status: INITIAL  3.  Patient will demonstrate ability to don and doff foot braces. Baseline: Patient dons with error Goal status: INITIAL  ASSESSMENT:  CLINICAL IMPRESSION: Patient is seen today for occupational therapy visit today secondary to White-Sutton syndrome in efforts to improve pt's independence with daily activities.  [name] noted to be wearing the same outfit she had on yesterday.  She was able to manage tight containers with her splints removed and without complaints of increased discomfort although she rated her pain at 7/10 upon arrival.  Skilled OT services are appropriate to establish healthy routines, identify AE and activities to promote increased independence for patient.   PERFORMANCE DEFICITS: in functional skills including ADLs, IADLs, sensation, pain, endurance, and UE functional use, cognitive skills including attention, memory, problem solving, safety awareness, sequencing, thought, and understand, and psychosocial skills including coping strategies and routines and behaviors.   IMPAIRMENTS: are limiting patient from ADLs, IADLs, work, and leisure.   COMORBIDITIES: may have co-morbidities  that affects occupational performance. Patient will benefit from skilled OT to address above impairments and improve overall  function.  REHAB POTENTIAL: Fair given chronicity of impairments  PLAN:  OT FREQUENCY: 2x/week  OT DURATION: 6 weeks  PLANNED INTERVENTIONS: self care/ADL training, therapeutic exercise, therapeutic activity, manual therapy, functional mobility training, splinting, paraffin, fluidotherapy, moist heat, patient/family education, cognitive remediation/compensation, and Re-evaluation  RECOMMENDED OTHER SERVICES: none at this time  CONSULTED AND AGREED WITH PLAN OF CARE: Patient  PLAN FOR NEXT SESSION:  Workbook/binder Counting Money - homework/handouts  Picture recipes AE - Opening tight jars  Setting schedule  Continue to review [employer] Life Skills Checklist for daily routine and OT goals.  Pain management - Voltaren for hands?  Victorino Sparrow, OT 09/07/2022, 9:32 AM

## 2022-09-15 ENCOUNTER — Ambulatory Visit: Payer: 59 | Admitting: Occupational Therapy

## 2022-09-15 DIAGNOSIS — Z741 Need for assistance with personal care: Secondary | ICD-10-CM

## 2022-09-15 DIAGNOSIS — R41840 Attention and concentration deficit: Secondary | ICD-10-CM | POA: Diagnosis not present

## 2022-09-15 DIAGNOSIS — R29898 Other symptoms and signs involving the musculoskeletal system: Secondary | ICD-10-CM

## 2022-09-15 DIAGNOSIS — R41844 Frontal lobe and executive function deficit: Secondary | ICD-10-CM

## 2022-09-15 NOTE — Therapy (Signed)
OUTPATIENT OCCUPATIONAL THERAPY TREATMENT  Patient Name: Deanna Levine MRN: [id number] DOB:09/13/2000, 22 y.o., female Today's Date: 09/15/2022  PCP: Myrlene Broker, MD  REFERRING PROVIDER: Myrlene Broker, MD  END OF SESSION:  OT End of Session - 09/15/22 0752     Visit Number 4    Number of Visits 13    Date for OT Re-Evaluation 10/07/22    Authorization Type [employer] - no auth required (21 VL)    OT Start Time 0800    OT Stop Time 0845    OT Time Calculation (min) 45 min    Activity Tolerance Patient tolerated treatment well    Behavior During Therapy WFL for tasks assessed/performed             Past Medical History:  Diagnosis Date   Absence seizure disorder (HCC)    Allergy    Colitis 07/16/2021   Complication of anesthesia    slow to wake up from anesthesia, occ HA with anesthesia    Diabetes mellitus without complication (HCC)    type 2   Difficulty swallowing pills    Dyspraxia    Eczema    Global developmental delay    Obesity (BMI 30-39.9)    PONV (postoperative nausea and vomiting)    Precocious female puberty    Seizures (HCC)    last seizure 2012 - no current med   Sleep apnea    White-Sutton syndrome    Past Surgical History:  Procedure Laterality Date   ADENOIDECTOMY     BALLOON DILATION N/A 11/23/2021   Procedure: BALLOON DILATION;  Surgeon: Tressia Danas, MD;  Location: [facility]OS[facility];  Service: Gastroenterology;  Laterality: N/A;   BIOPSY  07/16/2021   Procedure: BIOPSY;  Surgeon: Tressia Danas, MD;  Location: [facility] ENDOSCOPY;  Service: Gastroenterology;;   BIOPSY  11/23/2021   Procedure: BIOPSY;  Surgeon: Tressia Danas, MD;  Location: [facility];  Service: Gastroenterology;;   bone removal Left 01/16/2021   foot   COLONOSCOPY WITH PROPOFOL N/A 07/16/2021   Procedure: COLONOSCOPY WITH PROPOFOL;  Surgeon: Tressia Danas, MD;  Location: [facility] ENDOSCOPY;  Service: Gastroenterology;  Laterality: N/A;    ESOPHAGOGASTRODUODENOSCOPY (EGD) WITH PROPOFOL N/A 11/23/2021   Procedure: ESOPHAGOGASTRODUODENOSCOPY (EGD) WITH PROPOFOL;  Surgeon: Tressia Danas, MD;  Location: [facility];  Service: Gastroenterology;  Laterality: N/A;   GASTROCNEMIUS RECESSION Bilateral 04/25/2020   Procedure: BILATERAL GASTROCNEMIUS RECESSION;  Surgeon: Nadara Mustard, MD;  Location: [facility] OR;  Service: Orthopedics;  Laterality: Bilateral;   SUPPRELIN IMPLANT  05/06/2011   Procedure: SUPPRELIN IMPLANT;  Surgeon: Judie Petit. Leonia Corona, MD;  Location: [facility];  Service: Pediatrics;  Laterality: Right;   SUPPRELIN IMPLANT Right 06/14/2013   Procedure: REMOVAL OF SUPPRELIN IMPLANT FROM RIGHT UPPER ARM;  Surgeon: Judie Petit. Leonia Corona, MD;  Location: [facility];  Service: Pediatrics;  Laterality: Right;   TONSILLECTOMY     TONSILLECTOMY AND ADENOIDECTOMY     TYMPANOSTOMY TUBE PLACEMENT     x 2   Patient Active Problem List   Diagnosis Date Noted   Hyperhidrosis 12/30/2021   Dysphagia    Abdominal pain    OSA (obstructive sleep apnea) 09/30/2021   Bilateral hand numbness 09/15/2021   Acne vulgaris 08/26/2021   Noninfectious gastroenteritis    Bilateral arm pain 06/12/2021   DUB (dysfunctional uterine bleeding) 05/14/2021   Diarrhea 04/24/2021   Dysuria 04/24/2021   Sinusitis 04/24/2021   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   Other adverse food reactions, not elsewhere classified, subsequent  encounter 08/20/2020   Other allergic rhinitis 08/20/2020   Allergic conjunctivitis of both eyes 08/20/2020   Encounter for general adult medical examination with abnormal findings 07/03/2020   Leg length discrepancy 06/05/2020   Hidradenitis suppurativa 06/05/2020   Intellectual disability 06/05/2020   Achilles tendon contracture, bilateral    Elevated sedimentation rate 02/07/2020   Pain in right ankle and joints of right foot 02/07/2020   Anemia 12/29/2018   Type 2 diabetes mellitus (HCC)  08/15/2017   Elevated triglycerides with high cholesterol 12/09/2016   Mild intellectual disability 08/16/2012   Laxity of ligament 08/16/2012   Delayed milestones 08/16/2012   Obesity 06/07/2011   Dyspepsia 02/11/2011   Acanthosis nigricans 02/11/2011   Global developmental delay    Petit mal without grand mal seizures (HCC)    Dyspraxia     ONSET DATE: Birth (06/29/2022 - date of referral)  REFERRING DIAG: Q87.89 (ICD-10-CM) - White-Sutton syndrome M67.01,M67.02 (ICD-10-CM) - Achilles tendon contracture, bilateral  THERAPY DIAG:  Frontal lobe and executive function deficit  Need for assistance with personal care  Other symptoms and signs involving the musculoskeletal system  Rationale for Evaluation and Treatment: Rehabilitation  SUBJECTIVE:   SUBJECTIVE STATEMENT:  Patient brought in her "money" homework stated she got her hair done last night.  Patient reported that she and her mom did not get to work at [employer] last week and she has 2 appts tomorrow (endocrinologist and therapist to talk about her feelings).  She denies wanting to travel on public transportation herself to stores or wanting to live in her own apartment.  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  Pt accompanied by: self - father was in the lobby   PERTINENT HISTORY: Pt was born with White-Sutton Syndrome and was referred to OP OT to help improve independence with ADLs and IADLs. She also has persisting B hand pain, which has prevented her from holding a job and is now on disability. She lives at home but requires supervision and assistance.   Per pt's chart, "They want her to get help with overall everyday activities. They want her to get help with managing her pain so that she can prevent further injury - like walking. Also to help her to form  daily tasks like cooking; reading; and other items. She has problems with following steps. This is especially true with following through on applying creams and items her dermatologist wants her to do for her sores. She also has issues with keeping on her braces for her feet and putting them on properly even after she has been shown. Following directions is a big one. Measuring items; working on counting money and other mental development items. We are also working on bathing properly; learning to cut with a knife (even with cutting a pizza after she cooks; even a steak and other items like that we cut up for her.They want her to get help with overall everyday activities."   PRECAUTIONS: Fall; seizure  WEIGHT BEARING RESTRICTIONS: No  PAIN:  Are you having pain? No  FALLS: Has patient fallen in last 6 months? 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   OBJECTIVE:   HAND DOMINANCE: Left  ADLs: Overall ADLs: mod I  Eating: Requires assistance to cut food   FUNCTIONAL OUTCOME MEASURES: Evaluation 08/19/22: Neldon Mc: 31.8% disability with use of BUE  UPPER EXTREMITY ROM:    BUE: WNL  UPPER EXTREMITY MMT:    BUE: WNL  HAND FUNCTION: Gross grip strength WNL  COORDINATION: WNL  SENSATION: Reports paresthesias in R hand dorsally and proximal to 2nd digit  EDEMA: none observed; reports some swelling after yard work  COGNITION: Overall cognitive status: History of cognitive impairments - at baseline Areas of impairment: Areas of impairment:  Attention, Memory, Following commands, Safety/judgement, and Problem solving   OBSERVATIONS: Pt appears well-kept. [name] able to use phone to provide current list of medications. Ambulates without AD. No LOB though altered gait.   TODAY'S TREATMENT:                                                                                                                               Self Care:  Patient had been encouraged to work on "money homework" last week ie) taking random amounts of change, sorting the money by matching the same coins and then adding up the total amount of money (using her phone camera to take a picture for verification of correct completion and adding amounts).  She had taken pictures and videos of her process and was correct with 7/8 amounts - 2 cents to $2.04.  Patient asked for information about the jar opener and info was emailed to her.  Patient engaged in schedule review and information may need to be confirmed with parents.    Upon reviewing schedule, she does report taking medication 2x/day, having a weekly medication on Wednesday and lists multiple chores ie) washing dishes, laundry, cleaning bathroom and vacuuming.  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  In addition, she is encouraged to space out activities and/or rest as needed ie) clean smaller bathrooms first and larger bathroom another day/time.   Patient was engaged in removing clean utensils form dishwasher and was shown how to make tasks easier ie) bringing utensil case to the counter from dishwasher and drawer to minimize need to lean over multiple times but she reports that she likes to lean over for exercise.  Patient given weekly scheduled to record daily activities, appts etc to help with determining regular activities, spacing out chores,  times for other volunteer or work tasks etc.   PATIENT EDUCATION: Education details: Energy conservation/joint protection education initiated.  Person educated: Patient Education method:  Explanation Education comprehension: verbal cues required and needs further education  HOME EXERCISE PROGRAM: N/A this date  GOALS:  SHORT TERM GOALS: Target date: 09/16/2022    Pt will create daily schedule with assistance of therapist to promote independence and establish healthy routines.  Baseline: Goal status: IN PROGRESS  2.  Pt will complete PEATC Life Skills Checklist and establish appropriate LTG (s).  Baseline:  Goal status: IN PROGRESS  3.  Pt will recall pain management strategies mod I or better.   Baseline:  Goal status: IN PROGRESS    LONG TERM GOALS: Target date: 10/07/2022    Patient will demonstrate UE HEP with 25% verbal cues or less for proper execution. Baseline:  Goal status: INITIAL  2.  Patient will demonstrate at least 8% improvement with quick Dash score (reporting 23.8% disability or less) indicating improved functional use of affected extremity. Baseline: 31.8% disability with use of BUE Goal status: INITIAL  3.  Patient will demonstrate ability to don and doff foot braces. Baseline: Patient dons with error Goal status: MET 09/15/22 - Patient reports improved ease with donning her braces but does not wear them often lately.   ASSESSMENT:  CLINICAL IMPRESSION: Patient is seen today for occupational therapy visit today secondary to White-Sutton syndrome in efforts to improve pt's independence, safety and effectiveness with daily activities.  She did not have her splints on today and had no complaints of pain or increased discomfort with activities today.  Skilled OT services are appropriate to establish healthy routines, identify AE and activities to promote increased independence for patient.   PERFORMANCE DEFICITS: in functional skills including ADLs, IADLs,  sensation, pain, endurance, and UE functional use, cognitive skills including attention, memory, problem solving, safety awareness, sequencing, thought, and understand, and psychosocial skills including coping strategies and routines and behaviors.   IMPAIRMENTS: are limiting patient from ADLs, IADLs, work, and leisure.   COMORBIDITIES: may have co-morbidities  that affects occupational performance. Patient will benefit from skilled OT to address above impairments and improve overall function.  REHAB POTENTIAL: Fair given chronicity of impairments  PLAN:  OT FREQUENCY: 2x/week  OT DURATION: 6 weeks  PLANNED INTERVENTIONS: self care/ADL training, therapeutic exercise, therapeutic activity, manual therapy, functional mobility training, splinting, paraffin, fluidotherapy, moist heat, patient/family education, cognitive remediation/compensation, and Re-evaluation  RECOMMENDED OTHER SERVICES: none at this time  CONSULTED AND AGREED WITH PLAN OF CARE: Patient  PLAN FOR NEXT SESSION:  Workbook/binder (JB to bring from home)  Review schedule per patient's homework  May need to consult parents re: schedule for chores, volunteer and other homemaking tasks etc  Collect simple Picture recipes for home use.  Continue to review PEATC Life Skills Checklist for daily routine and OT goals.  Pain management - Voltaren for hands?  Victorino Sparrow, OT 09/15/2022, 9:03 AM

## 2022-09-16 ENCOUNTER — Encounter: Payer: Self-pay | Admitting: "Endocrinology

## 2022-09-16 ENCOUNTER — Ambulatory Visit (INDEPENDENT_AMBULATORY_CARE_PROVIDER_SITE_OTHER): Payer: 59 | Admitting: Behavioral Health

## 2022-09-16 ENCOUNTER — Encounter (INDEPENDENT_AMBULATORY_CARE_PROVIDER_SITE_OTHER): Payer: Self-pay

## 2022-09-16 ENCOUNTER — Ambulatory Visit: Payer: 59 | Admitting: "Endocrinology

## 2022-09-16 VITALS — BP 95/80 | HR 94 | Ht 64.0 in | Wt 181.6 lb

## 2022-09-16 DIAGNOSIS — F419 Anxiety disorder, unspecified: Secondary | ICD-10-CM

## 2022-09-16 DIAGNOSIS — Z7985 Long-term (current) use of injectable non-insulin antidiabetic drugs: Secondary | ICD-10-CM | POA: Diagnosis not present

## 2022-09-16 DIAGNOSIS — E119 Type 2 diabetes mellitus without complications: Secondary | ICD-10-CM | POA: Diagnosis not present

## 2022-09-16 DIAGNOSIS — R4589 Other symptoms and signs involving emotional state: Secondary | ICD-10-CM | POA: Diagnosis not present

## 2022-09-16 DIAGNOSIS — E7800 Pure hypercholesterolemia, unspecified: Secondary | ICD-10-CM

## 2022-09-16 MED ORDER — BLOOD GLUCOSE MONITORING SUPPL DEVI: 1.0000 | Freq: Three times a day (TID) | 0 refills | Status: AC

## 2022-09-16 MED ORDER — LANCET DEVICE MISC: 1.0000 | Freq: Three times a day (TID) | 0 refills | Status: AC

## 2022-09-16 MED ORDER — TIRZEPATIDE 12.5 MG/0.5ML ~~LOC~~ SOAJ: 12.5000 mg | SUBCUTANEOUS | 1 refills | Status: DC

## 2022-09-16 MED ORDER — TIRZEPATIDE 15 MG/0.5ML ~~LOC~~ SOAJ: 15.0000 mg | SUBCUTANEOUS | 1 refills | Status: DC

## 2022-09-16 MED ORDER — BLOOD GLUCOSE TEST VI STRP: 1.0000 | ORAL_STRIP | Freq: Three times a day (TID) | 3 refills | Status: DC

## 2022-09-16 MED ORDER — DEXCOM G7 SENSOR MISC: 1.0000 | 0 refills | Status: AC

## 2022-09-16 MED ORDER — LANCETS MISC. MISC: 1.0000 | Freq: Three times a day (TID) | 3 refills | Status: AC

## 2022-09-16 NOTE — Progress Notes (Signed)
[facility] Counselor/Therapist Progress Note  Patient ID: Deanna Levine, MRN: [id number],    Date: 09/16/2022  Time Spent: 55 min Caregility video; Pt is outside @ home in private & Provider working remoted from [employer]. Pt is aware of the risks/limitations of telehealth & Pt & Guardians are fully informed & consent to Tx today.    Treatment Type: Individual Therapy  Reported Symptoms: Reduction in anx/dep due to Family activities & chores. Pt is excited for learning tasks in OT that she can apply to ADL's in the home.  Mental Status Exam: Appearance:  Casual     Behavior: Appropriate and Sharing  Motor: Normal  Speech/Language:  Clear and Coherent  Affect: Appropriate  Mood: normal  Thought process: normal  Thought content:   WNL  Sensory/Perceptual disturbances:   WNL  Orientation: oriented to person, place, time/date, and situation  Attention: Good  Concentration: Good  Memory: WNL  Fund of knowledge:  Good  Insight:   Good  Judgment:  Good  Impulse Control: Good   Risk Assessment: Danger to Self:  No Self-injurious Behavior: No Danger to Others: No Duty to Warn:no Physical Aggression / Violence:No  Access to Firearms a concern: No  Gang Involvement:No   Subjective: Pt is upbeat today & inc'g her activities in the home. [name] active & engaged in her health. She watches her DM status w/her A1c checks. [name] learning to use her phone to measure her Bl sugar levels. It will alarm in the nighttime & she will wake up & eat/drink something. [name] not being social w/friends. She keeps to herself & stays busy @ home.   Interventions: Family Systems  Diagnosis:Anxiety  Anxiety about health  Plan: Montie will cont to track her healthcare needs & manage her wellness. She has goals to get her own Apt & use the Bus to have transportation to medical appts. She wants to broach the subject w/her Parents to see what they think. [name] also trying to be more  social. She will notice how this impacts her mood.   Target Date: 10/29/2022  Progress: 7  Frequency: Once every 3-4 wks  Modality: Claretta Fraise, LMFT

## 2022-09-16 NOTE — Progress Notes (Signed)
Outpatient Endocrinology Note Altamese Milton, MD  09/16/22   Deanna Levine [birth year] [id number]  Referring Provider: Dessa Phi, MD Primary Care Provider: Myrlene Broker, MD Reason for consultation: Subjective   Assessment & Plan  Deanna Levine was seen today for diabetes.  Diagnoses and all orders for this visit:  Controlled type 2 diabetes mellitus without complication, without long-term current use of insulin (HCC)  Pure hypercholesterolemia  Other orders -     Blood Glucose Monitoring Suppl DEVI; 1 each by Does not apply route in the morning, at noon, and at bedtime. May substitute to any manufacturer covered by patient's insurance. -     Glucose Blood (BLOOD GLUCOSE TEST STRIPS) STRP; 1 each by In Vitro route in the morning, at noon, and at bedtime. May substitute to any manufacturer covered by patient's insurance. -     Lancet Device MISC; 1 each by Does not apply route in the morning, at noon, and at bedtime. May substitute to any manufacturer covered by patient's insurance. -     Lancets Misc. MISC; 1 each by Does not apply route in the morning, at noon, and at bedtime. May substitute to any manufacturer covered by patient's insurance. -     tirzepatide (MOUNJARO) 15 MG/0.5ML Pen; Inject 15 mg into the skin once a week. -     Continuous Glucose Sensor (DEXCOM G7 SENSOR) MISC; 1 Device by Does not apply route continuous.    Diabetes Type II with no known complications  Lab Results  Component Value Date   GFR 150.75 11/15/2018   Hba1c goal less than 7, current Hba1c is  Lab Results  Component Value Date   HGBA1C 5.1 08/31/2022   Will recommend the following: Mounjaro 15 mg weekly  No known contraindications to any of above medications No history of MEN syndrome/medullary thyroid cancer/pancreatitis or pancreatic cancer in self or family   -Last LD and Tg are as follows: Lab Results  Component Value Date   LDLCALC 103 (H) 06/01/2022    Lab  Results  Component Value Date   TRIG 58 06/01/2022   -Follow low fat diet and exercise   -Blood pressure goal <140/90 - Microalbumin/creatinine goal < 30 -Last MA/Cr is as follows: Lab Results  Component Value Date   MICROALBUR 3.1 06/01/2022   -not on ACE/ARB -dit changes including salt restriction -limit eating outside -counseled BP targets per standards of diabetes care -uncontrolled blood pressure can lead to retinopathy, nephropathy and cardiovascular and atherosclerotic heart disease  Reviewed and counseled on: -A1C target -Blood sugar targets -Complications of uncontrolled diabetes  -Checking blood sugar before meals and bedtime and bring log next visit -All medications with mechanism of action and side effects -Hypoglycemia management: rule of 15's, Glucagon Emergency Kit and medical alert ID -low-carb low-fat plate-method diet -At least 20 minutes of physical activity per day -Annual dilated retinal eye exam and foot exam -compliance and follow up needs -follow up as scheduled or earlier if problem gets worse  Call if blood sugar is less than 70 or consistently above 250    Take a 15 gm snack of carbohydrate at bedtime before you go to sleep if your blood sugar is less than 100.    If you are going to fast after midnight for a test or procedure, ask your physician for instructions on how to reduce/decrease your insulin dose.    Call if blood sugar is less than 70 or consistently above 250  -Treating a low sugar by  rule of 15  (15 gms of sugar every 15 min until sugar is more than 70) If you feel your sugar is low, test your sugar to be sure If your sugar is low (less than 70), then take 15 grams of a fast acting Carbohydrate (3-4 glucose tablets or glucose gel or 4 ounces of juice or regular soda) Recheck your sugar 15 min after treating low to make sure it is more than 70 If sugar is still less than 70, treat again with 15 grams of carbohydrate          Don't  drive the hour of hypoglycemia  If unconscious/unable to eat or drink by mouth, use glucagon injection or nasal spray baqsimi and call 911. Can repeat again in 15 min if still unconscious.  Return in about 3 months (around 12/20/2022).   I have reviewed current medications, nurse's notes, allergies, vital signs, past medical and surgical history, family medical history, and social history for this encounter. Counseled patient on symptoms, examination findings, lab findings, imaging results, treatment decisions and monitoring and prognosis. The patient understood the recommendations and agrees with the treatment plan. All questions regarding treatment plan were fully answered.  Altamese Schall Circle, MD  09/16/22    History of Present Illness Deanna Levine is a 22 y.o. year old female who presents for evaluation of Type II diabetes mellitus.  Deanna Levine was first diagnosed at age of 22. 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  Home diabetes regimen: Mounjaro 12.5 mg weekly  COMPLICATIONS -  MI/Stroke -  retinopathy -  neuropathy -  nephropathy  SYMPTOMS REVIEWED - Polyuria - Weight loss - Blurred vision  BLOOD SUGAR DATA Doesn't check BG at home   Physical Exam  BP 95/80   Pulse 94   Ht 5\' 4"  (1.626 m)   Wt 181 lb 9.6 oz (82.4 kg)   SpO2 99%   BMI 31.17 kg/m    Constitutional: well developed, well nourished Head: normocephalic, atraumatic Eyes: sclera anicteric, no redness Neck: supple Lungs: normal respiratory effort Neurology: alert and oriented Skin: dry, no appreciable rashes Musculoskeletal: no appreciable defects Psychiatric: normal mood and affect Diabetic Foot Exam - Simple   No data filed      Current Medications Patient's Medications  New Prescriptions   BLOOD GLUCOSE MONITORING SUPPL DEVI    1 each by Does not apply route in the morning, at noon, and at bedtime. May substitute to any  manufacturer covered by patient's insurance.   CONTINUOUS GLUCOSE SENSOR (DEXCOM G7 SENSOR) MISC    1 Device by Does not apply route continuous.   GLUCOSE BLOOD (BLOOD GLUCOSE TEST STRIPS) STRP    1 each by In Vitro route in the morning, at noon, and at bedtime. May substitute to any manufacturer covered by patient's insurance.   LANCET DEVICE MISC    1 each by Does not apply route in the morning, at noon, and at bedtime. May substitute to any manufacturer covered by patient's insurance.   LANCETS MISC. MISC    1 each by Does not apply route in the morning, at noon, and at bedtime. May substitute to any manufacturer covered by patient's insurance.   TIRZEPATIDE (MOUNJARO) 15 MG/0.5ML PEN    Inject 15 mg into the skin once a week.  Previous Medications   ACETAMINOPHEN (TYLENOL) 160 MG/5ML ELIXIR    Take 15 mg/kg by mouth every 4 (four) hours as needed for fever.  AZELASTINE (ASTELIN) 0.1 % NASAL SPRAY    Place 2 sprays into both nostrils 2 (two) times daily. Use in each nostril as directed   CETIRIZINE (ZYRTEC) 10 MG TABLET    Take 10 mg by mouth daily as needed for allergies.   CHLORHEXIDINE (HIBICLENS) 4 % EXTERNAL LIQUID    Apply 1 Application topically daily as needed.   CLINDAMYCIN-BENZOYL PEROXIDE (BENZACLIN) GEL    Apply 1 Application topically 2 (two) times daily.   DICLOFENAC (VOLTAREN) 0.1 % OPHTHALMIC SOLUTION    Place 2 drops into both eyes 4 (four) times daily.   FIBER ADULT GUMMIES 2 G CHEW    Chew 1 tablet by mouth daily.   FLUTICASONE (FLONASE) 50 MCG/ACT NASAL SPRAY    SHAKE LIQUID AND USE 2 SPRAYS IN EACH NOSTRIL DAILY   IBUPROFEN (ADVIL) 600 MG TABLET    Take 600 mg by mouth every 6 (six) hours as needed.   MELOXICAM (MOBIC) 15 MG TABLET    Take 15 mg by mouth daily.   MENAQUINONE-7 PO    Take 100 mcg by mouth daily.   MESALAMINE (PENTASA) 250 MG CR CAPSULE    Take 250 mg by mouth 3 (three) times daily.   NA SULFATE-K SULFATE-MG SULF 17.5-3.13-1.6 GM/177ML SOLN    Take 177 mLs  by mouth See admin instructions.   ONDANSETRON (ZOFRAN) 4 MG/5ML SOLUTION    Take 4 mg by mouth every 8 (eight) hours as needed for nausea or vomiting.   PROPYLENE GLYCOL (SYSTANE BALANCE OP)    Apply to eye.   TRIAMCINOLONE CREAM (KENALOG) 0.1 %    Apply to rough area of feet twice daily.   ZONISAMIDE (ZONEGRAN) 100 MG CAPSULE    Take 2 capsules daily  Modified Medications   No medications on file  Discontinued Medications   ACETAMINOPHEN (TYLENOL) 160 MG/5ML ELIXIR    Take 960 mg by mouth every 4 (four) hours as needed (headache).   AZELASTINE (ASTELIN) 0.1 % NASAL SPRAY    Place 1 spray into both nostrils 2 (two) times daily. Use in each nostril as directed   CHLORHEXIDINE (HIBICLENS) 4 % EXTERNAL LIQUID    Apply topically daily as needed.   CHOLECALCIFEROL (VITAMIN D3 GUMMIES ADULT PO)    Take 2 tablets by mouth daily.   CLINDAMYCIN-BENZOYL PER, REFR, GEL    Use as directed up to twice a day   DICLOFENAC SODIUM (VOLTAREN) 1 % GEL    Apply 4 g topically 4 (four) times daily.   DICYCLOMINE (BENTYL) 20 MG TABLET    Take a half tablet 4 times daily before meals.   IBUPROFEN (ADVIL) 200 MG TABLET    Take 600 mg by mouth every 6 (six) hours as needed for moderate pain.   MELOXICAM (MOBIC) 15 MG TABLET    Take 15 mg by mouth daily.   MENAQUINONE-7 (VITAMIN K2) 100 MCG CAPS    Take 100 mcg by mouth daily.   MESALAMINE (PENTASA) 250 MG CR CAPSULE    Take 4 capsules (1,000 mg total) by mouth 3 (three) times daily.   METAMUCIL FIBER CHEW    Chew 1 tablet by mouth daily.   NA SULFATE-K SULFATE-MG SULF 17.5-3.13-1.6 GM/177ML SOLN    See admin instructions.   ONDANSETRON (ZOFRAN-ODT) 4 MG DISINTEGRATING TABLET    Take 1 tablet (4 mg total) by mouth every 8 (eight) hours as needed for nausea or vomiting.   TIRZEPATIDE (MOUNJARO) 12.5 MG/0.5ML PEN    INJECT 12.5 MG  UNDER THE SKIN ONCE A WEEK   TIRZEPATIDE (MOUNJARO) 12.5 MG/0.5ML PEN    Inject 12.5 mg into the skin once a week.    Allergies No Known  Allergies  Past Medical History Past Medical History:  Diagnosis Date   Absence seizure disorder (HCC)    Allergy    Colitis 07/16/2021   Complication of anesthesia    slow to wake up from anesthesia, occ HA with anesthesia    Diabetes mellitus without complication (HCC)    type 2   Difficulty swallowing pills    Dyspraxia    Eczema    Global developmental delay    Obesity (BMI 30-39.9)    PONV (postoperative nausea and vomiting)    Precocious female puberty    Seizures (HCC)    last seizure 2012 - no current med   Sleep apnea    White-Sutton syndrome     Past Surgical History Past Surgical History:  Procedure Laterality Date   ADENOIDECTOMY     BALLOON DILATION N/A 11/23/2021   Procedure: BALLOON DILATION;  Surgeon: Tressia Danas, MD;  Location: [facility];  Service: Gastroenterology;  Laterality: N/A;   BIOPSY  07/16/2021   Procedure: BIOPSY;  Surgeon: Tressia Danas, MD;  Location: [facility];  Service: Gastroenterology;;   BIOPSY  11/23/2021   Procedure: BIOPSY;  Surgeon: Tressia Danas, MD;  Location: [facility];  Service: Gastroenterology;;   bone removal Left 01/16/2021   foot   COLONOSCOPY WITH PROPOFOL N/A 07/16/2021   Procedure: COLONOSCOPY WITH PROPOFOL;  Surgeon: Tressia Danas, MD;  Location: [facility] END[facility]COPY;  Service: Gastroenterology;  Laterality: N/A;   ESOPHAGOGASTRODUODENOSCOPY (EGD) WITH PROPOFOL N/A 11/23/2021   Procedure: ESOPHAGOGASTRODUODENOSCOPY (EGD) WITH PROPOFOL;  Surgeon: Tressia Danas, MD;  Location: [facility];  Service: Gastroenterology;  Laterality: N/A;   GASTROCNEMIUS RECESSION Bilateral 04/25/2020   Procedure: BILATERAL GASTROCNEMIUS RECESSION;  Surgeon: Nadara Mustard, MD;  Location: [facility] OR;  Service: Orthopedics;  Laterality: Bilateral;   SUPPRELIN IMPLANT  05/06/2011   Procedure: SUPPRELIN IMPLANT;  Surgeon: Judie Petit. Leonia Corona, MD;  Location: [facility];  Service: Pediatrics;  Laterality: Right;    SUPPRELIN IMPLANT Right 06/14/2013   Procedure: REMOVAL OF SUPPRELIN IMPLANT FROM RIGHT UPPER ARM;  Surgeon: Judie Petit. Leonia Corona, MD;  Location: [facility];  Service: Pediatrics;  Laterality: Right;   TONSILLECTOMY     TONSILLECTOMY AND ADENOIDECTOMY     TYMPANOSTOMY TUBE PLACEMENT     x 2    Family History family history includes Anesthesia problems in her mother; Asthma in her maternal aunt; Colon cancer in an other family member; Diabetes in her maternal grandmother, mother, and paternal grandmother; Fibromyalgia in her mother; Heart disease in her maternal grandfather and paternal grandmother; Henoch-Schonlein purpura in her brother; Hyperlipidemia in her maternal grandfather, maternal grandmother, paternal grandfather, and paternal grandmother; Hypertension in her maternal grandfather, maternal grandmother, paternal grandfather, and paternal grandmother; Kidney disease in her maternal grandfather and paternal grandmother; Rheum arthritis in her mother; Seizures in her maternal uncle; Ulcerative colitis in her mother.  Social History Social History   Socioeconomic History   Marital status: Single    Spouse name: Not on file   Number of children: 0   Years of education: 12   Highest education level: Not on file  Occupational History   Occupation: [employer]  Tobacco Use   Smoking status: Never    Passive exposure: Never   Smokeless tobacco: Never  Vaping Use   Vaping status: Never Used  Substance and Sexual Activity   Alcohol use: No    Alcohol/week: 0.0 standard drinks of alcohol   Drug use: No   Sexual activity: Never    Birth control/protection: None  Other Topics Concern   Not on file  Social History Narrative   Deanna Levine is graduated. [name] doing average.    She enjoys playing softball and basketball.   Lives with her parents.    Not working.   Left handed   Two story home      Had surgery on her legs in Feb and is physical therapy twice a week.    Left handed   No caffeine   Social Determinants of Health   Financial Resource Strain: Not on file  Food Insecurity: Not on file  Transportation Needs: Not on file  Physical Activity: Not on file  Stress: Not on file  Social Connections: Not on file  Intimate Partner Violence: Not on file    Lab Results  Component Value Date   HGBA1C 5.1 08/31/2022   HGBA1C 5.1 06/01/2022   HGBA1C 6.4 02/18/2022   Lab Results  Component Value Date   CHOL 155 06/01/2022   Lab Results  Component Value Date   HDL 38 (L) 06/01/2022   Lab Results  Component Value Date   LDLCALC 103 (H) 06/01/2022   Lab Results  Component Value Date   TRIG 58 06/01/2022   Lab Results  Component Value Date   CHOLHDL 4.1 06/01/2022   Lab Results  Component Value Date   CREATININE 0.70 06/01/2022   Lab Results  Component Value Date   GFR 150.75 11/15/2018   Lab Results  Component Value Date   MICROALBUR 3.1 06/01/2022      Component Value Date/Time   NA 141 06/01/2022 0929   NA 131 (L) 03/05/2019 1430   K 4.4 06/01/2022 0929   CL 111 (H) 06/01/2022 0929   CO2 20 06/01/2022 0929   GLUCOSE 89 06/01/2022 0929   BUN 14 06/01/2022 0929   BUN 12 03/05/2019 1430   CREATININE 0.70 06/01/2022 0929   CALCIUM 9.3 06/01/2022 0929   PROT 7.4 06/01/2022 0929   PROT 7.0 03/05/2019 1430   ALBUMIN 4.4 03/05/2019 1430   AST 14 06/01/2022 0929   ALT 10 06/01/2022 0929   ALKPHOS 116 (H) 03/05/2019 1430   BILITOT 0.5 06/01/2022 0929   BILITOT 0.4 03/05/2019 1430   GFRNONAA >60 04/25/2020 0646   GFRAA 139 03/05/2019 1430      Latest Ref Rng & Units 06/01/2022    9:29 AM 08/24/2021    2:18 PM 08/06/2021    8:46 AM  BMP  Glucose 65 - 99 mg/dL 89  161    BUN 7 - 25 mg/dL 14  10    Creatinine 0.96 - 0.96 mg/dL 0.45  4.09  8.11   BUN/Creat Ratio 6 - 22 (calc) SEE NOTE:  NOT APPLICABLE    Sodium 135 - 146 mmol/L 141  139    Potassium 3.5 - 5.3 mmol/L 4.4  3.9    Chloride 98 - 110 mmol/L 111  105    CO2  20 - 32 mmol/L 20  23    Calcium 8.6 - 10.2 mg/dL 9.3  9.7         Component Value Date/Time   WBC 6.0 06/01/2022 0929   RBC 4.70 06/01/2022 0929   HGB 10.0 (L) 06/01/2022 0929   HGB 11.3 03/05/2019 1430   HCT 33.1 (L) 06/01/2022 9147  HCT 35.7 03/05/2019 1430   PLT 353 06/01/2022 0929   PLT 182 03/05/2019 1430   MCV 70.4 (L) 06/01/2022 0929   MCV 67 (L) 03/05/2019 1430   MCH 21.3 (L) 06/01/2022 0929   MCHC 30.2 (L) 06/01/2022 0929   RDW 16.5 (H) 06/01/2022 0929   RDW 17.9 (H) 03/05/2019 1430   LYMPHSABS 2,365 02/12/2021 1104   MONOABS 0.5 07/05/2012 1610   EOSABS 26 02/12/2021 1104   BASOSABS 26 02/12/2021 1104     Parts of this note may have been dictated using voice recognition software. There may be variances in spelling and vocabulary which are unintentional. Not all errors are proofread. Please notify the Thereasa Parkin if any discrepancies are noted or if the meaning of any statement is not clear.

## 2022-09-16 NOTE — Progress Notes (Signed)
                Victoria L Winstead, LMFT 

## 2022-09-17 ENCOUNTER — Ambulatory Visit: Payer: 59 | Admitting: Occupational Therapy

## 2022-09-17 DIAGNOSIS — Z741 Need for assistance with personal care: Secondary | ICD-10-CM

## 2022-09-17 DIAGNOSIS — M79642 Pain in left hand: Secondary | ICD-10-CM

## 2022-09-17 DIAGNOSIS — R41840 Attention and concentration deficit: Secondary | ICD-10-CM | POA: Diagnosis not present

## 2022-09-17 DIAGNOSIS — R41844 Frontal lobe and executive function deficit: Secondary | ICD-10-CM

## 2022-09-17 DIAGNOSIS — R29898 Other symptoms and signs involving the musculoskeletal system: Secondary | ICD-10-CM

## 2022-09-18 NOTE — Therapy (Signed)
OUTPATIENT OCCUPATIONAL THERAPY TREATMENT  Patient Name: Deanna Levine MRN: [id number] DOB:2000-11-07, 22 y.o., female Today's Date: 09/17/2022  PCP: Myrlene Broker, MD  REFERRING PROVIDER: Myrlene Broker, MD  END OF SESSION: visit: 5 POC end date: 10/07/2022 [facility] Medicaid Time start, stop, total time: 9:39-10:17 = 38 minutes Tolerance: Good  Past Medical History:  Diagnosis Date   Absence seizure disorder (HCC)    Allergy    Colitis 07/16/2021   Complication of anesthesia    slow to wake up from anesthesia, occ HA with anesthesia    Diabetes mellitus without complication (HCC)    type 2   Difficulty swallowing pills    Dyspraxia    Eczema    Global developmental delay    Obesity (BMI 30-39.9)    PONV (postoperative nausea and vomiting)    Precocious female puberty    Seizures (HCC)    last seizure 2012 - no current med   Sleep apnea    White-Sutton syndrome    Past Surgical History:  Procedure Laterality Date   ADENOIDECTOMY     BALLOON DILATION N/A 11/23/2021   Procedure: BALLOON DILATION;  Surgeon: Tressia Danas, MD;  Location: [facility];  Service: Gastroenterology;  Laterality: N/A;   BIOPSY  07/16/2021   Procedure: BIOPSY;  Surgeon: Tressia Danas, MD;  Location: [facility] ENDOSCOPY;  Service: Gastroenterology;;   BIOPSY  11/23/2021   Procedure: BIOPSY;  Surgeon: Tressia Danas, MD;  Location: [facility];  Service: Gastroenterology;;   bone removal Left 01/16/2021   foot   COLONOSCOPY WITH PROPOFOL N/A 07/16/2021   Procedure: COLONOSCOPY WITH PROPOFOL;  Surgeon: Tressia Danas, MD;  Location: [facility] ENDOSCOPY;  Service: Gastroenterology;  Laterality: N/A;   ESOPHAGOGASTRODUODENOSCOPY (EGD) WITH PROPOFOL N/A 11/23/2021   Procedure: ESOPHAGOGASTRODUODENOSCOPY (EGD) WITH PROPOFOL;  Surgeon: Tressia Danas, MD;  Location: [facility];  Service: Gastroenterology;  Laterality: N/A;   GASTROCNEMIUS RECESSION Bilateral 04/25/2020   Procedure:  BILATERAL GASTROCNEMIUS RECESSION;  Surgeon: Nadara Mustard, MD;  Location: [facility] OR;  Service: Orthopedics;  Laterality: Bilateral;   SUPPRELIN IMPLANT  05/06/2011   Procedure: SUPPRELIN IMPLANT;  Surgeon: Judie Petit. Leonia Corona, MD;  Location: [facility];  Service: Pediatrics;  Laterality: Right;   SUPPRELIN IMPLANT Right 06/14/2013   Procedure: REMOVAL OF SUPPRELIN IMPLANT FROM RIGHT UPPER ARM;  Surgeon: Judie Petit. Leonia Corona, MD;  Location: [facility];  Service: Pediatrics;  Laterality: Right;   TONSILLECTOMY     TONSILLECTOMY AND ADENOIDECTOMY     TYMPANOSTOMY TUBE PLACEMENT     x 2   Patient Active Problem List   Diagnosis Date Noted   Hyperhidrosis 12/30/2021   Dysphagia    Abdominal pain    OSA (obstructive sleep apnea) 09/30/2021   Bilateral hand numbness 09/15/2021   Acne vulgaris 08/26/2021   Noninfectious gastroenteritis    Bilateral arm pain 06/12/2021   DUB (dysfunctional uterine bleeding) 05/14/2021   Diarrhea 04/24/2021   Dysuria 04/24/2021   Sinusitis 04/24/2021   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   Other adverse food reactions, not elsewhere classified, subsequent encounter 08/20/2020   Other allergic rhinitis 08/20/2020   Allergic conjunctivitis of both eyes 08/20/2020   Encounter for general adult medical examination with abnormal findings 07/03/2020   Leg length discrepancy 06/05/2020   Hidradenitis suppurativa 06/05/2020   Intellectual disability 06/05/2020   Achilles tendon contracture, bilateral    Elevated sedimentation rate 02/07/2020   Pain in right ankle and joints of right foot 02/07/2020   Anemia 12/29/2018   Type 2  diabetes mellitus (HCC) 08/15/2017   Elevated triglycerides with high cholesterol 12/09/2016   Mild intellectual disability 08/16/2012   Laxity of ligament 08/16/2012   Delayed milestones 08/16/2012   Obesity 06/07/2011   Dyspepsia 02/11/2011   Acanthosis nigricans 02/11/2011   Global developmental delay     Petit mal without grand mal seizures (HCC)    Dyspraxia     ONSET DATE: Birth (06/29/2022 - date of referral)  REFERRING DIAG: Q87.89 (ICD-10-CM) - White-Sutton syndrome M67.01,M67.02 (ICD-10-CM) - Achilles tendon contracture, bilateral  THERAPY DIAG:  Need for assistance with personal care  Other symptoms and signs involving the musculoskeletal system  Pain in left hand  Frontal lobe and executive function deficit  Rationale for Evaluation and Treatment: Rehabilitation  SUBJECTIVE:   SUBJECTIVE STATEMENT:  She has ordered but not received her jar opener.   Pt accompanied by: self   PERTINENT HISTORY: Pt was born with White-Sutton Syndrome and was referred to OP OT to help improve independence with ADLs and IADLs. She also has persisting B hand pain, which has prevented her from holding a job and is now on disability. She lives at home but requires supervision and assistance.   Per pt's chart, "They want her to get help with overall everyday activities. They want her to get help with managing her pain so that she can prevent further injury - like walking. Also to help her to form daily tasks like cooking; reading; and other items. She has problems with following steps. This is especially true with following through on applying creams and items her dermatologist wants her to do for her sores. She also has issues with keeping on her braces for her feet and putting them on properly even after she has been shown. Following directions is a big one. Measuring items; working on counting money and other mental development items. We are also working on bathing properly; learning to cut with a knife (even with cutting a pizza after she cooks; even a steak and other items like that we cut up for her.They want her to get help with overall everyday activities."   PRECAUTIONS: Fall; seizure  WEIGHT BEARING RESTRICTIONS: No  PAIN:  Are you having pain? No  FALLS: Has patient fallen in last 6  months? No  LIVING ENVIRONMENT: Lives with: lives with their family Lives in: House/apartment Stairs: Yes: Internal: 12-13 steps; on right going up Has following equipment at home: Single point cane, Walker - 4 wheeled, and Wheelchair (manual)  PLOF: Independent with basic ADLs; does not drive; was pushing carts at [place] until 10/2020; is on disability  PATIENT GOALS: She wants to be able to do a lot to things, especially cutting up her food better and get her hand better.   OBJECTIVE:   HAND DOMINANCE: Left  ADLs: Overall ADLs: mod I  Eating: Requires assistance to cut food   FUNCTIONAL OUTCOME MEASURES: Evaluation 08/19/22: Neldon Mc: 31.8% disability with use of BUE  UPPER EXTREMITY ROM:    BUE: WNL  UPPER EXTREMITY MMT:    BUE: WNL  HAND FUNCTION: Gross grip strength WNL  COORDINATION: WNL  SENSATION: Reports paresthesias in R hand dorsally and proximal to 2nd digit  EDEMA: none observed; reports some swelling after yard work  COGNITION: Overall cognitive status: History of cognitive impairments - at baseline Areas of impairment: Areas of impairment: Attention, Memory, Following commands, Safety/judgement, and Problem solving   OBSERVATIONS: Pt appears well-kept. [name] able to use phone to provide current list of  medications. Ambulates without AD. No LOB though altered gait.   TODAY'S TREATMENT:                                                                                                                               Self Care:  Identified Daily Tasks and Additional Tasks. Created lists for pt to utilize with her schedule and as needed to improve occupational performance by planning ahead and better managing pain.   Reviewed life skills checklist to focus on activities she needs to focus on. Identified washing hair as goal to address during next visit.  - Therapeutic exercises completed for duration as noted below including:  OT initiated tendon  glides as noted in pt instructions. Pt completing B for improved pain and to maintain ROM as needed to complete daily tasks.   PATIENT EDUCATION: Education details: Daily and additional tasks; activities to work on; tendon glides Person educated: Patient Education method: Chief Technology Officer Education comprehension: verbalized understanding, verbal cues required, and needs further education  HOME EXERCISE PROGRAM: 09/17/2022: Tendon glides    GOALS:  SHORT TERM GOALS: Target date: 09/16/2022    Pt will create daily schedule with assistance of therapist to promote independence and establish healthy routines.  Baseline: Goal status: IN PROGRESS  2.  Pt will complete [facility] Life Skills Checklist and establish appropriate LTG (s).  Baseline:  Goal status: IN PROGRESS  3.  Pt will recall pain management strategies mod I or better.   Baseline:  Goal status: IN PROGRESS    LONG TERM GOALS: Target date: 10/07/2022    Patient will demonstrate UE HEP with 25% verbal cues or less for proper execution. Baseline:  Goal status: IN PROGRESS  2.  Patient will demonstrate at least 8% improvement with quick Dash score (reporting 23.8% disability or less) indicating improved functional use of affected extremity. Baseline: 31.8% disability with use of BUE Goal status: INITIAL  3.  Patient will demonstrate ability to don and doff foot braces. Baseline: Patient dons with error Goal status: MET 09/15/22 - Patient reports improved ease with donning her braces but does not wear them often lately.   ASSESSMENT:  CLINICAL IMPRESSION: Patient is seen today for occupational therapy visit today secondary to White-Sutton syndrome in efforts to improve pt's independence, safety and effectiveness with daily activities.  She did not have her splints on today and had no complaints of pain or increased discomfort with activities today.  Skilled OT services are appropriate to establish healthy routines,  identify AE and activities to promote increased independence for patient.   PERFORMANCE DEFICITS: in functional skills including ADLs, IADLs, sensation, pain, endurance, and UE functional use, cognitive skills including attention, memory, problem solving, safety awareness, sequencing, thought, and understand, and psychosocial skills including coping strategies and routines and behaviors.   IMPAIRMENTS: are limiting patient from ADLs, IADLs, work, and leisure.   COMORBIDITIES: may have co-morbidities  that affects occupational performance. Patient will benefit  from skilled OT to address above impairments and improve overall function.  REHAB POTENTIAL: Fair given chronicity of impairments  PLAN:  OT FREQUENCY: 2x/week  OT DURATION: 6 weeks  PLANNED INTERVENTIONS: self care/ADL training, therapeutic exercise, therapeutic activity, manual therapy, functional mobility training, splinting, paraffin, fluidotherapy, moist heat, patient/family education, cognitive remediation/compensation, and Re-evaluation  RECOMMENDED OTHER SERVICES: none at this time  CONSULTED AND AGREED WITH PLAN OF CARE: Patient  PLAN FOR NEXT SESSION:  Workbook/binder (JB to bring from home)  Review schedule per patient's homework  May need to consult parents re: schedule for chores, volunteer and other homemaking tasks etc  Collect simple Picture recipes for home use.  Continue to review [facility] Life Skills Checklist for daily routine and OT goals.  Pain management - Voltaren for hands?  Delana Meyer, OT 09/18/2022, 9:02 PM

## 2022-09-21 ENCOUNTER — Ambulatory Visit: Payer: 59 | Admitting: Occupational Therapy

## 2022-09-23 ENCOUNTER — Ambulatory Visit: Payer: 59 | Admitting: Occupational Therapy

## 2022-09-23 DIAGNOSIS — R41840 Attention and concentration deficit: Secondary | ICD-10-CM | POA: Diagnosis not present

## 2022-09-23 DIAGNOSIS — Z741 Need for assistance with personal care: Secondary | ICD-10-CM

## 2022-09-23 DIAGNOSIS — R29898 Other symptoms and signs involving the musculoskeletal system: Secondary | ICD-10-CM

## 2022-09-23 DIAGNOSIS — R41844 Frontal lobe and executive function deficit: Secondary | ICD-10-CM

## 2022-09-23 NOTE — Therapy (Signed)
OUTPATIENT OCCUPATIONAL THERAPY TREATMENT  Patient Name: Deanna Levine MRN: [id number] DOB:04-13-2000, 22 y.o., female Today's Date: 09/17/2022  PCP: Myrlene Broker, MD  REFERRING PROVIDER: Myrlene Broker, MD  END OF SESSION:  OT End of Session - 09/23/22 0758     Visit Number 6    Number of Visits 13    Date for OT Re-Evaluation 10/07/22    Authorization Type [employer] Medicaid - no auth required (21 VL)    OT Start Time 0800    OT Stop Time 0844    OT Time Calculation (min) 44 min    Activity Tolerance Patient tolerated treatment well    Behavior During Therapy [facility] for tasks assessed/performed           visit: 5 POC end date: 10/07/2022 [facility] Medicaid Time start, stop, total time: 9:39-10:17 = 38 minutes Tolerance: Good  Past Medical History:  Diagnosis Date   Absence seizure disorder (HCC)    Allergy    Colitis 07/16/2021   Complication of anesthesia    slow to wake up from anesthesia, occ HA with anesthesia    Diabetes mellitus without complication (HCC)    type 2   Difficulty swallowing pills    Dyspraxia    Eczema    Global developmental delay    Obesity (BMI 30-39.9)    PONV (postoperative nausea and vomiting)    Precocious female puberty    Seizures (HCC)    last seizure 2012 - no current med   Sleep apnea    White-Sutton syndrome    Past Surgical History:  Procedure Laterality Date   ADENOIDECTOMY     BALLOON DILATION N/A 11/23/2021   Procedure: BALLOON DILATION;  Surgeon: Tressia Danas, MD;  Location: [facility];  Service: Gastroenterology;  Laterality: N/A;   BIOPSY  07/16/2021   Procedure: BIOPSY;  Surgeon: Tressia Danas, MD;  Location: [facility] ENDOSCOPY;  Service: Gastroenterology;;   BIOPSY  11/23/2021   Procedure: BIOPSY;  Surgeon: Tressia Danas, MD;  Location: [facility];  Service: Gastroenterology;;   bone removal Left 01/16/2021   foot   COLONOSCOPY WITH PROPOFOL N/A 07/16/2021   Procedure: COLONOSCOPY WITH PROPOFOL;   Surgeon: Tressia Danas, MD;  Location: [facility] ENDOSCOPY;  Service: Gastroenterology;  Laterality: N/A;   ESOPHAGOGASTRODUODENOSCOPY (EGD) WITH PROPOFOL N/A 11/23/2021   Procedure: ESOPHAGOGASTRODUODENOSCOPY (EGD) WITH PROPOFOL;  Surgeon: Tressia Danas, MD;  Location: [facility];  Service: Gastroenterology;  Laterality: N/A;   GASTROCNEMIUS RECESSION Bilateral 04/25/2020   Procedure: BILATERAL GASTROCNEMIUS RECESSION;  Surgeon: Nadara Mustard, MD;  Location: [facility] OR;  Service: Orthopedics;  Laterality: Bilateral;   SUPPRELIN IMPLANT  05/06/2011   Procedure: SUPPRELIN IMPLANT;  Surgeon: Judie Petit. Leonia Corona, MD;  Location: [facility];  Service: Pediatrics;  Laterality: Right;   SUPPRELIN IMPLANT Right 06/14/2013   Procedure: REMOVAL OF SUPPRELIN IMPLANT FROM RIGHT UPPER ARM;  Surgeon: Judie Petit. Leonia Corona, MD;  Location: [facility];  Service: Pediatrics;  Laterality: Right;   TONSILLECTOMY     TONSILLECTOMY AND ADENOIDECTOMY     TYMPANOSTOMY TUBE PLACEMENT     x 2   Patient Active Problem List   Diagnosis Date Noted   Hyperhidrosis 12/30/2021   Dysphagia    Abdominal pain    OSA (obstructive sleep apnea) 09/30/2021   Bilateral hand numbness 09/15/2021   Acne vulgaris 08/26/2021   Noninfectious gastroenteritis    Bilateral arm pain 06/12/2021   DUB (dysfunctional uterine bleeding) 05/14/2021   Diarrhea 04/24/2021   Dysuria 04/24/2021  Sinusitis 04/24/2021   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   Other adverse food reactions, not elsewhere classified, subsequent encounter 08/20/2020   Other allergic rhinitis 08/20/2020   Allergic conjunctivitis of both eyes 08/20/2020   Encounter for general adult medical examination with abnormal findings 07/03/2020   Leg length discrepancy 06/05/2020   Hidradenitis suppurativa 06/05/2020   Intellectual disability 06/05/2020   Achilles tendon contracture, bilateral    Elevated sedimentation rate 02/07/2020   Pain in right  ankle and joints of right foot 02/07/2020   Anemia 12/29/2018   Type 2 diabetes mellitus (HCC) 08/15/2017   Elevated triglycerides with high cholesterol 12/09/2016   Mild intellectual disability 08/16/2012   Laxity of ligament 08/16/2012   Delayed milestones 08/16/2012   Obesity 06/07/2011   Dyspepsia 02/11/2011   Acanthosis nigricans 02/11/2011   Global developmental delay    Petit mal without grand mal seizures (HCC)    Dyspraxia     ONSET DATE: Birth (06/29/2022 - date of referral)  REFERRING DIAG: Q87.89 (ICD-10-CM) - White-Sutton syndrome M67.01,M67.02 (ICD-10-CM) - Achilles tendon contracture, bilateral  THERAPY DIAG:  Frontal lobe and executive function deficit  Other symptoms and signs involving the musculoskeletal system  Attention and concentration deficit  Need for assistance with personal care  Rationale for Evaluation and Treatment: Rehabilitation  SUBJECTIVE:   SUBJECTIVE STATEMENT:  Pt reports feeling well this morning and is not hurting.  She has been doing her exercises, massaging her hand, using the heat pad and icing it and her hand has not been hurting since last week.  She filled out her weekly schedule and listed 2 recipes she wanted to make ie) Peanut Butter Brownie and Shrimp Alfredo.  Pt accompanied by: self   PERTINENT HISTORY: Pt was born with White-Sutton Syndrome and was referred to OP OT to help improve independence with ADLs and IADLs. She also has persisting B hand pain, which has prevented her from holding a job and is now on disability. She lives at home but requires supervision and assistance.   Per pt's chart, "They want her to get help with overall everyday activities. They want her to get help with managing her pain so that she can prevent further injury - like walking. Also to help her to form daily tasks like cooking; reading; and other items. She has problems with following steps. This is especially true with following through on  applying creams and items her dermatologist wants her to do for her sores. She also has issues with keeping on her braces for her feet and putting them on properly even after she has been shown. Following directions is a big one. Measuring items; working on counting money and other mental development items. We are also working on bathing properly; learning to cut with a knife (even with cutting a pizza after she cooks; even a steak and other items like that we cut up for her.They want her to get help with overall everyday activities."   PRECAUTIONS: Fall; seizure  WEIGHT BEARING RESTRICTIONS: No  PAIN:  Are you having pain? No  FALLS: Has patient fallen in last 6 months? No  LIVING ENVIRONMENT: Lives with: lives with their family Lives in: House/apartment Stairs: Yes: Internal: 12-13 steps; on right going up Has following equipment at home: Single point cane, Walker - 4 wheeled, and Wheelchair (manual)  PLOF: Independent with basic ADLs; does not drive; was pushing carts at [place] until 10/2020; is on disability  PATIENT GOALS: She wants to be able to do a lot  to things, especially cutting up her food better and get her hand better.   OBJECTIVE:   HAND DOMINANCE: Left  ADLs: Overall ADLs: mod I  Eating: Requires assistance to cut food   FUNCTIONAL OUTCOME MEASURES: Evaluation 08/19/22: Neldon Mc: 31.8% disability with use of BUE  UPPER EXTREMITY ROM:    BUE: WNL  UPPER EXTREMITY MMT:    BUE: WNL  HAND FUNCTION: Gross grip strength WNL  COORDINATION: WNL  SENSATION: Reports paresthesias in R hand dorsally and proximal to 2nd digit  EDEMA: none observed; reports some swelling after yard work  COGNITION: Overall cognitive status: History of cognitive impairments - at baseline Areas of impairment: Areas of impairment: Attention, Memory, Following commands, Safety/judgement, and Problem solving   OBSERVATIONS: Pt appears well-kept. [name] able to use phone to  provide current list of medications. Ambulates without AD. No LOB though altered gait.   TODAY'S TREATMENT:                                                                                                                               Therapeutic Activities :  Patient had al her information in a folder and was assisted to assemble a binder to sperate the info with patient encouraged to sort handouts at home into categories like therapy exercises, medical information, ideas, daily schedules etc.  S/p identification of washing hair as goal to address during  previous visit - activity was assessed and ideas explored to improve her thoroughness with tasks as her dad helps her with doing it over the sink right now because she does not complete it thoroughly enough  She was encouraged to consider dividing her head into parts and thoroughly scrubbing each area x 2 minutes with task completed today with OTR timing it for total of 8 minutes ie) 4 parts (front, side, back and bottom) on both right and left side. She was able to do task for duration noted (sitting upright) without pain complaints and did not use resting position demonstrated by OT ie) proper elbow on table or counter if at the sink.  Reviewed idea of Shower Visor as an option to also consider to keep water/soap out of her face/eyes.  Reviewed conditioned use of weekly schedule with plan to print more pages for her to use but patient able to setup a folder on her phone to keep track of herself per per personal preference  PATIENT EDUCATION: Education details: Information binder organization  Person educated: Patient Education method: Chief Technology Officer Education comprehension: verbalized understanding, verbal cues required, and needs further education  HOME EXERCISE PROGRAM: 09/17/2022: Tendon glides   GOALS:  SHORT TERM GOALS: Target date: 09/16/2022    Pt will create daily schedule with assistance of therapist to promote  independence and establish healthy routines.  Baseline: Goal status: IN PROGRESS  2.  Pt will complete [facility] Life Skills Checklist and establish appropriate LTG (s).  Baseline:  Goal status: IN  PROGRESS  3.  Pt will recall pain management strategies mod I or better.   Baseline:  Goal status: IN PROGRESS    LONG TERM GOALS: Target date: 10/07/2022    Patient will demonstrate UE HEP with 25% verbal cues or less for proper execution. Baseline:  Goal status: IN PROGRESS  2.  Patient will demonstrate at least 8% improvement with quick Dash score (reporting 23.8% disability or less) indicating improved functional use of affected extremity. Baseline: 31.8% disability with use of BUE Goal status: INITIAL  3.  Patient will demonstrate ability to don and doff foot braces. Baseline: Patient dons with error Goal status: MET 09/15/22 - Patient reports improved ease with donning her braces but does not wear them often lately.   ASSESSMENT:  CLINICAL IMPRESSION: Patient is seen today for occupational therapy visit today secondary to White-Sutton syndrome in efforts to improve pt's independence, safety and effectiveness with daily activities.  She did not have her splints on again today and had no complaints of pain or increased discomfort with activities today, even noting the the exercises she has been doing have been helping.  Skilled OT services continue to be appropriate to establish healthy routines, identify AE and activities to promote increased independence for patient.   PERFORMANCE DEFICITS: in functional skills including ADLs, IADLs, sensation, pain, endurance, and UE functional use, cognitive skills including attention, memory, problem solving, safety awareness, sequencing, thought, and understand, and psychosocial skills including coping strategies and routines and behaviors.   IMPAIRMENTS: are limiting patient from ADLs, IADLs, work, and leisure.   COMORBIDITIES: may have  co-morbidities  that affects occupational performance. Patient will benefit from skilled OT to address above impairments and improve overall function.  REHAB POTENTIAL: Fair given chronicity of impairments  PLAN:  OT FREQUENCY: 2x/week  OT DURATION: 6 weeks  PLANNED INTERVENTIONS: self care/ADL training, therapeutic exercise, therapeutic activity, manual therapy, functional mobility training, splinting, paraffin, fluidotherapy, moist heat, patient/family education, cognitive remediation/compensation, and Re-evaluation  RECOMMENDED OTHER SERVICES: none at this time  CONSULTED AND AGREED WITH PLAN OF CARE: Patient  PLAN FOR NEXT SESSION:  Review organization of Workbook/binder  Review schedule per patient's homework  May need to consult parents re: schedule for chores, volunteer and other homemaking tasks etc  Collect simple Picture recipes for home use.  Continue to review [facility] Life Skills Checklist for daily routine and OT goals.  Pain management - Voltaren for hands?  Victorino Sparrow, OT 09/23/2022, 9:25 AM

## 2022-09-27 ENCOUNTER — Ambulatory Visit: Payer: 59 | Admitting: Occupational Therapy

## 2022-09-27 DIAGNOSIS — R278 Other lack of coordination: Secondary | ICD-10-CM

## 2022-09-27 DIAGNOSIS — R41840 Attention and concentration deficit: Secondary | ICD-10-CM | POA: Diagnosis not present

## 2022-09-27 DIAGNOSIS — R41844 Frontal lobe and executive function deficit: Secondary | ICD-10-CM

## 2022-09-27 NOTE — Therapy (Signed)
OUTPATIENT OCCUPATIONAL THERAPY TREATMENT  Patient Name: Deanna Levine MRN: [id number] DOB:12-28-2000, 22 y.o., female Today's Date: 09/17/2022  PCP: Myrlene Broker, MD  REFERRING PROVIDER: Myrlene Broker, MD  END OF SESSION:  OT End of Session - 09/27/22 0845     Visit Number 7    Number of Visits 13    Date for OT Re-Evaluation 10/07/22    Authorization Type [employer] - no auth required (21 VL)    OT Start Time 0846    OT Stop Time 0929    OT Time Calculation (min) 43 min    Activity Tolerance Patient tolerated treatment well    Behavior During Therapy [facility] for tasks assessed/performed           visit: 5 POC end date: 10/07/2022 [facility] Medicaid Time start, stop, total time: 9:39-10:17 = 38 minutes Tolerance: Good  Past Medical History:  Diagnosis Date   Absence seizure disorder (HCC)    Allergy    Colitis 07/16/2021   Complication of anesthesia    slow to wake up from anesthesia, occ HA with anesthesia    Diabetes mellitus without complication (HCC)    type 2   Difficulty swallowing pills    Dyspraxia    Eczema    Global developmental delay    Obesity (BMI 30-39.9)    PONV (postoperative nausea and vomiting)    Precocious female puberty    Seizures (HCC)    last seizure 2012 - no current med   Sleep apnea    White-Sutton syndrome    Past Surgical History:  Procedure Laterality Date   ADENOIDECTOMY     BALLOON DILATION N/A 11/23/2021   Procedure: BALLOON DILATION;  Surgeon: Tressia Danas, MD;  Location: [facility] ENDOSCOPY;  Service: Gastroenterology;  Laterality: N/A;   BIOPSY  07/16/2021   Procedure: BIOPSY;  Surgeon: Tressia Danas, MD;  Location: [facility] ENDOSCOPY;  Service: Gastroenterology;;   BIOPSY  11/23/2021   Procedure: BIOPSY;  Surgeon: Tressia Danas, MD;  Location: [facility] ENDOSCOPY;  Service: Gastroenterology;;   bone removal Left 01/16/2021   foot   COLONOSCOPY WITH PROPOFOL N/A 07/16/2021   Procedure: COLONOSCOPY WITH PROPOFOL;   Surgeon: Tressia Danas, MD;  Location: [facility] ENDOSCOPY;  Service: Gastroenterology;  Laterality: N/A;   ESOPHAGOGASTRODUODENOSCOPY (EGD) WITH PROPOFOL N/A 11/23/2021   Procedure: ESOPHAGOGASTRODUODENOSCOPY (EGD) WITH PROPOFOL;  Surgeon: Tressia Danas, MD;  Location: [facility] ENDOSCOPY;  Service: Gastroenterology;  Laterality: N/A;   GASTROCNEMIUS RECESSION Bilateral 04/25/2020   Procedure: BILATERAL GASTROCNEMIUS RECESSION;  Surgeon: Nadara Mustard, MD;  Location: [facility] OR;  Service: Orthopedics;  Laterality: Bilateral;   SUPPRELIN IMPLANT  05/06/2011   Procedure: SUPPRELIN IMPLANT;  Surgeon: Judie Petit. Leonia Corona, MD;  Location: [facility];  Service: Pediatrics;  Laterality: Right;   SUPPRELIN IMPLANT Right 06/14/2013   Procedure: REMOVAL OF SUPPRELIN IMPLANT FROM RIGHT UPPER ARM;  Surgeon: Judie Petit. Leonia Corona, MD;  Location:  SURGERY CENTER;  Service: Pediatrics;  Laterality: Right;   TONSILLECTOMY     TONSILLECTOMY AND ADENOIDECTOMY     TYMPANOSTOMY TUBE PLACEMENT     x 2   Patient Active Problem List   Diagnosis Date Noted   Hyperhidrosis 12/30/2021   Dysphagia    Abdominal pain    OSA (obstructive sleep apnea) 09/30/2021   Bilateral hand numbness 09/15/2021   Acne vulgaris 08/26/2021   Noninfectious gastroenteritis    Bilateral arm pain 06/12/2021   DUB (dysfunctional uterine bleeding) 05/14/2021   Diarrhea 04/24/2021   Dysuria 04/24/2021  Sinusitis 04/24/2021   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   Other adverse food reactions, not elsewhere classified, subsequent encounter 08/20/2020   Other allergic rhinitis 08/20/2020   Allergic conjunctivitis of both eyes 08/20/2020   Encounter for general adult medical examination with abnormal findings 07/03/2020   Leg length discrepancy 06/05/2020   Hidradenitis suppurativa 06/05/2020   Intellectual disability 06/05/2020   Achilles tendon contracture, bilateral    Elevated sedimentation rate 02/07/2020   Pain in right  ankle and joints of right foot 02/07/2020   Anemia 12/29/2018   Type 2 diabetes mellitus (HCC) 08/15/2017   Elevated triglycerides with high cholesterol 12/09/2016   Mild intellectual disability 08/16/2012   Laxity of ligament 08/16/2012   Delayed milestones 08/16/2012   Obesity 06/07/2011   Dyspepsia 02/11/2011   Acanthosis nigricans 02/11/2011   Global developmental delay    Petit mal without grand mal seizures (HCC)    Dyspraxia     ONSET DATE: Birth (06/29/2022 - date of referral)  REFERRING DIAG: Q87.89 (ICD-10-CM) - White-Sutton syndrome M67.01,M67.02 (ICD-10-CM) - Achilles tendon contracture, bilateral  THERAPY DIAG:  Other lack of coordination  Frontal lobe and executive function deficit  Rationale for Evaluation and Treatment: Rehabilitation  SUBJECTIVE:   SUBJECTIVE STATEMENT:  Pt reports feeling well this morning and is not hurting.  She has been doing her exercises 1-2x/day and she has not been having pain.  She has been keeping up with her daily schedule on her phone - she did have at least 2 days where she did not get up until late (after 12).  Her vitamins are taken in the morning and her medicine in the evening.  She can't wait to make Peanut Butter Brownies and can't wait to get her free shirt.  Pt accompanied by: self   PERTINENT HISTORY: Pt was born with White-Sutton Syndrome and was referred to OP OT to help improve independence with ADLs and IADLs. She also has persisting B hand pain, which has prevented her from holding a job and is now on disability. She lives at home but requires supervision and assistance.   Per pt's chart, "They want her to get help with overall everyday activities. They want her to get help with managing her pain so that she can prevent further injury - like walking. Also to help her to form daily tasks like cooking; reading; and other items. She has problems with following steps. This is especially true with following through on  applying creams and items her dermatologist wants her to do for her sores. She also has issues with keeping on her braces for her feet and putting them on properly even after she has been shown. Following directions is a big one. Measuring items; working on counting money and other mental development items. We are also working on bathing properly; learning to cut with a knife (even with cutting a pizza after she cooks; even a steak and other items like that we cut up for her.They want her to get help with overall everyday activities."   PRECAUTIONS: Fall; seizure  WEIGHT BEARING RESTRICTIONS: No  PAIN:  Are you having pain? No  FALLS: Has patient fallen in last 6 months? 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   OBJECTIVE:   HAND DOMINANCE: Left  ADLs: Overall ADLs: mod I  Eating: Requires assistance to cut food   FUNCTIONAL OUTCOME MEASURES: Evaluation 08/19/22: Neldon Mc: 31.8% disability with use of BUE  UPPER EXTREMITY ROM:    BUE: WNL  UPPER EXTREMITY MMT:    BUE: WNL  HAND FUNCTION: Gross grip strength WNL  COORDINATION: WNL  SENSATION: Reports paresthesias in R hand dorsally and proximal to 2nd digit  EDEMA: none observed; reports some swelling after yard work  COGNITION: Overall cognitive status: History of cognitive impairments - at baseline Areas of impairment: Areas of impairment: Attention, Memory, Following commands, Safety/judgement, and Problem solving   OBSERVATIONS: Pt appears well-kept. [name] able to use phone to  provide current list of medications. Ambulates without AD. No LOB though altered gait.   TODAY'S TREATMENT:                                                                                                                               Self Care:  Patient brought her new jar opener to therapy today and was shown how to put it around jar lids and use it to open containers with good success opening a glass pickle jar and medicine bottle.  Patient engaged in IADL - kitchen activity.  She reports being nervous about using the stove other than for making hot dogs or eggs.  [name] able to read the instructions on the Jello box and was engaged in measuring water, boiling water and mixing water and powder together, then adding ice to cool it until .   OT demonstrated placing a pot upside down in the sink to place the measuring cup for safety when pouring hot water into the measuring cup.  Patient appropriately poured the hot water away from herself  and at a slow pace without concerns for spillage or burn.  She it able to move the bowl to the sink to stir it but needed cues to add the   PATIENT EDUCATION: Education details: Sales executive educated: Patient Education method: Explanation, Demonstration, Tactile cues, Verbal cues, and Handouts Education comprehension: verbalized understanding, verbal cues required, and needs further education  HOME EXERCISE PROGRAM: 09/17/2022: Tendon glides   GOALS:  SHORT TERM GOALS: Target date: 09/16/2022    Pt will create daily schedule with assistance of therapist to promote independence and establish healthy routines.  Baseline: Goal status: IN PROGRESS  2.  Pt will complete [facility] Life Skills Checklist and establish appropriate LTG (s).  Baseline:  Goal status: IN PROGRESS  3.  Pt will recall pain management strategies mod I or better.   Baseline:  Goal status: IN PROGRESS    LONG TERM GOALS: Target date: 10/07/2022    Patient will  demonstrate UE HEP with 25% verbal  cues or less for proper execution. Baseline:  Goal status: IN PROGRESS  2.  Patient will demonstrate at least 8% improvement with quick Dash score (reporting 23.8% disability or less) indicating improved functional use of affected extremity. Baseline: 31.8% disability with use of BUE Goal status: INITIAL  3.  Patient will demonstrate ability to don and doff foot braces. Baseline: Patient dons with error Goal status: MET 09/15/22 - Patient reports improved ease with donning her braces but does not wear them often lately.   ASSESSMENT:  CLINICAL IMPRESSION: Patient is seen today for occupational therapy visit today to further progress independence and safety with home management activities.  She has not had pain in over a week and is doing her hand exercises regularly. Skilled OT services continue to be appropriate to establish healthy routines, identify AE and activities to promote increased independence for patient.   PERFORMANCE DEFICITS: in functional skills including ADLs, IADLs, sensation, pain, endurance, and UE functional use, cognitive skills including attention, memory, problem solving, safety awareness, sequencing, thought, and understand, and psychosocial skills including coping strategies and routines and behaviors.   IMPAIRMENTS: are limiting patient from ADLs, IADLs, work, and leisure.   COMORBIDITIES: may have co-morbidities  that affects occupational performance. Patient will benefit from skilled OT to address above impairments and improve overall function.  REHAB POTENTIAL: Fair given chronicity of impairments  PLAN:  OT FREQUENCY: 2x/week  OT DURATION: 6 weeks  PLANNED INTERVENTIONS: self care/ADL training, therapeutic exercise, therapeutic activity, manual therapy, functional mobility training, splinting, paraffin, fluidotherapy, moist heat, patient/family education, cognitive remediation/compensation, and  Re-evaluation  RECOMMENDED OTHER SERVICES: none at this time  CONSULTED AND AGREED WITH PLAN OF CARE: Patient  PLAN FOR NEXT SESSION:   Review schedule per patient's homework Review organization of Workbook/binder Need to consult parents re: schedule for chores, volunteer and other homemaking tasks etc  Collect simple Picture recipes for home use.  Continue to practice cooking activities/training.  Continue to review [facility] Life Skills Checklist for daily routine and OT goals.  Victorino Sparrow, OT 09/27/2022, 9:34 AM

## 2022-09-28 ENCOUNTER — Ambulatory Visit (INDEPENDENT_AMBULATORY_CARE_PROVIDER_SITE_OTHER): Payer: 59 | Admitting: Podiatry

## 2022-09-28 ENCOUNTER — Encounter: Payer: Self-pay | Admitting: Podiatry

## 2022-09-28 DIAGNOSIS — Q6689 Other  specified congenital deformities of feet: Secondary | ICD-10-CM

## 2022-09-28 DIAGNOSIS — M19071 Primary osteoarthritis, right ankle and foot: Secondary | ICD-10-CM

## 2022-09-28 MED ORDER — MELOXICAM 15 MG PO TABS: 15.0000 mg | ORAL_TABLET | Freq: Every day | ORAL | 3 refills | Status: DC

## 2022-09-28 NOTE — Progress Notes (Signed)
Subjective:  Patient ID: Deanna Levine, female    DOB: 07/14/2000,  MRN: [id number]  Chief Complaint  Patient presents with   Foot Pain    "I'm here to get my results.  I need a refill of my Meloxicam."    22 y.o. female presents with the above complaint. History confirmed with patient.  Left side continues to do well.  The right foot is still quite painful.  They would like to proceed with surgery.  Meloxicam takes the edge off but does not get rid of the pain.  Objective:  Physical Exam: warm, good capillary refill, no trophic changes or ulcerative lesions, normal DP and PT pulses, normal sensory exam, and no pain on left foot.  The right foot has pain to palpation of the sinus tarsi, medial subtalar joint and with limited range of motion of the subtalar joint  Study Result  Narrative & Impression  CLINICAL DATA:  Subtalar arthritis, coalition   EXAM: CT OF THE RIGHT FOOT WITHOUT CONTRAST   TECHNIQUE: Multidetector CT imaging of the right foot was performed according to the standard protocol. Multiplanar CT image reconstructions were also generated.   RADIATION DOSE REDUCTION: This exam was performed according to the departmental dose-optimization program which includes automated exposure control, adjustment of the mA and/or kV according to patient size and/or use of iterative reconstruction technique.   COMPARISON:  None Available.   FINDINGS: Bones/Joint/Cartilage   No fracture or dislocation. Normal alignment. No joint effusion. Middle facet fibrocartilaginous talocalcaneal coalition with reactive cystic changes on either side. Remainder of the joint spaces are maintained.   Ligaments   Ligaments are suboptimally evaluated by CT.   Muscles and Tendons Muscles are normal. No muscle atrophy. No intramuscular fluid collection or hematoma. Flexor, extensor, peroneal and Achilles tendons are intact.   Soft tissue No fluid collection or hematoma.  No soft tissue  mass.   IMPRESSION: 1. Middle facet fibrocartilaginous talocalcaneal coalition with reactive cystic changes on either side.     Electronically Signed   By: Elige Ko M.D.   On: 08/21/2022 06:24     Assessment:   1. Coalition, calcaneal tarsal   2. Arthritis of right subtalar joint      Plan:  Patient was evaluated and treated and all questions answered.  Right side becoming worse and more symptomatic and lifestyle limiting now.  Ambulation is difficult.  We reviewed the results of the CT scan and compared this to her previous MRI.  I discussed with him that her left side did well with resection although when we proceeded with surgery the coalition was much more extensive into the subtalar posterior facet than the scans indicated.  I discussed with him if possible we will try to attempt resection and interposition to improve motion on the right side as well.  Her CT scan appears to show extension into the subtalar joint as well.  I discussed with him that if this is significantly arthritic then I would recommend proceeding with subtalar arthrodesis which likely will be an intraoperative decision we discussed the risk benefits and potential complications of this procedure including but limited to pain, swelling, infection, scar, numbness which may be temporary or permanent, chronic pain, stiffness, nerve pain or damage, wound healing problems, bone healing problems including delayed or non-union.  We also discussed a period of nonweightbearing and immobilization for 4 to 8 weeks utilizing her knee scooter which we discussed will be dependent upon the intraoperative decision for subtalar fusion versus resection.  All questions addressed.  Informed consent signed and reviewed.   Surgical plan:  Procedure: -Right foot middle facet coalition resection versus subtalar joint fusion with autologous bone marrow aspirate concentrate and bone allograft  Location: -[facility]  Anesthesia plan: -IV  sedation with regional block  Postoperative pain plan: - Tylenol 1000 mg every 6 hours, ibuprofen 600 mg every 6 hours, gabapentin 300 mg every 8 hours x5 days, oxycodone 5 mg 1-2 tabs every 6 hours only as needed  DVT prophylaxis: -None required  WB Restrictions / DME needs: -NWB in splint postop with knee scooter    No follow-ups on file.

## 2022-09-30 ENCOUNTER — Ambulatory Visit: Payer: 59 | Attending: Internal Medicine | Admitting: Occupational Therapy

## 2022-09-30 DIAGNOSIS — R41840 Attention and concentration deficit: Secondary | ICD-10-CM | POA: Diagnosis present

## 2022-09-30 DIAGNOSIS — M6281 Muscle weakness (generalized): Secondary | ICD-10-CM | POA: Diagnosis present

## 2022-09-30 DIAGNOSIS — Z741 Need for assistance with personal care: Secondary | ICD-10-CM

## 2022-09-30 DIAGNOSIS — R41844 Frontal lobe and executive function deficit: Secondary | ICD-10-CM | POA: Diagnosis present

## 2022-09-30 DIAGNOSIS — R278 Other lack of coordination: Secondary | ICD-10-CM

## 2022-09-30 NOTE — Therapy (Signed)
OUTPATIENT OCCUPATIONAL THERAPY TREATMENT  Patient Name: Deanna Levine MRN: [id number] DOB:[birth year], 22 y.o., female Today's Date: 09/17/2022  PCP: Myrlene Broker, MD  REFERRING PROVIDER: Myrlene Broker, MD  END OF SESSION:  OT End of Session - 09/30/22 0757     Visit Number 8    Number of Visits 13    Date for OT Re-Evaluation 10/07/22    Authorization Type [employer] - no auth required (21 VL)    OT Start Time 0759    OT Stop Time 0844    OT Time Calculation (min) 45 min    Equipment Utilized During Treatment kitchen tools, stove etc    Activity Tolerance Patient tolerated treatment well    Behavior During Therapy WFL for tasks assessed/performed           visit: 5 POC end date: 10/07/2022 [facility] Medicaid Time start, stop, total time: 9:39-10:17 = 38 minutes Tolerance: Good  Past Medical History:  Diagnosis Date   Absence seizure disorder (HCC)    Allergy    Colitis 07/16/2021   Complication of anesthesia    slow to wake up from anesthesia, occ HA with anesthesia    Diabetes mellitus without complication (HCC)    type 2   Difficulty swallowing pills    Dyspraxia    Eczema    Global developmental delay    Obesity (BMI 30-39.9)    PONV (postoperative nausea and vomiting)    Precocious female puberty    Seizures (HCC)    last seizure 2012 - no current med   Sleep apnea    White-Sutton syndrome    Past Surgical History:  Procedure Laterality Date   ADENOIDECTOMY     BALLOON DILATION N/A 11/23/2021   Procedure: BALLOON DILATION;  Surgeon: Tressia Danas, MD;  Location: [facility];  Service: Gastroenterology;  Laterality: N/A;   BIOPSY  07/16/2021   Procedure: BIOPSY;  Surgeon: Tressia Danas, MD;  Location: [facility] ENDOSCOPY;  Service: Gastroenterology;;   BIOPSY  11/23/2021   Procedure: BIOPSY;  Surgeon: Tressia Danas, MD;  Location: [facility];  Service: Gastroenterology;;   bone removal Left 01/16/2021   foot   COLONOSCOPY WITH  PROPOFOL N/A 07/16/2021   Procedure: COLONOSCOPY WITH PROPOFOL;  Surgeon: Tressia Danas, MD;  Location: [facility] ENDOSCOPY;  Service: Gastroenterology;  Laterality: N/A;   ESOPHAGOGASTRODUODENOSCOPY (EGD) WITH PROPOFOL N/A 11/23/2021   Procedure: ESOPHAGOGASTRODUODENOSCOPY (EGD) WITH PROPOFOL;  Surgeon: Tressia Danas, MD;  Location: [facility];  Service: Gastroenterology;  Laterality: N/A;   GASTROCNEMIUS RECESSION Bilateral 04/25/2020   Procedure: BILATERAL GASTROCNEMIUS RECESSION;  Surgeon: Nadara Mustard, MD;  Location: [facility] OR;  Service: Orthopedics;  Laterality: Bilateral;   SUPPRELIN IMPLANT  05/06/2011   Procedure: SUPPRELIN IMPLANT;  Surgeon: Judie Petit. Leonia Corona, MD;  Location: [facility];  Service: Pediatrics;  Laterality: Right;   SUPPRELIN IMPLANT Right 06/14/2013   Procedure: REMOVAL OF SUPPRELIN IMPLANT FROM RIGHT UPPER ARM;  Surgeon: Judie Petit. Leonia Corona, MD;  Location: [facility];  Service: Pediatrics;  Laterality: Right;   TONSILLECTOMY     TONSILLECTOMY AND ADENOIDECTOMY     TYMPANOSTOMY TUBE PLACEMENT     x 2   Patient Active Problem List   Diagnosis Date Noted   Hyperhidrosis 12/30/2021   Dysphagia    Abdominal pain    OSA (obstructive sleep apnea) 09/30/2021   Bilateral hand numbness 09/15/2021   Acne vulgaris 08/26/2021   Noninfectious gastroenteritis    Bilateral arm pain 06/12/2021   DUB (dysfunctional uterine  bleeding) 05/14/2021   Diarrhea 04/24/2021   Dysuria 04/24/2021   Sinusitis 04/24/2021   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   Other adverse food reactions, not elsewhere classified, subsequent encounter 08/20/2020   Other allergic rhinitis 08/20/2020   Allergic conjunctivitis of both eyes 08/20/2020   Encounter for general adult medical examination with abnormal findings 07/03/2020   Leg length discrepancy 06/05/2020   Hidradenitis suppurativa 06/05/2020   Intellectual disability 06/05/2020   Achilles tendon contracture,  bilateral    Elevated sedimentation rate 02/07/2020   Pain in right ankle and joints of right foot 02/07/2020   Anemia 12/29/2018   Type 2 diabetes mellitus (HCC) 08/15/2017   Elevated triglycerides with high cholesterol 12/09/2016   Mild intellectual disability 08/16/2012   Laxity of ligament 08/16/2012   Delayed milestones 08/16/2012   Obesity 06/07/2011   Dyspepsia 02/11/2011   Acanthosis nigricans 02/11/2011   Global developmental delay    Petit mal without grand mal seizures (HCC)    Dyspraxia     ONSET DATE: Birth (06/29/2022 - date of referral)  REFERRING DIAG: Q87.89 (ICD-10-CM) - White-Sutton syndrome M67.01,M67.02 (ICD-10-CM) - Achilles tendon contracture, bilateral  THERAPY DIAG:  Other lack of coordination  Frontal lobe and executive function deficit  Attention and concentration deficit  Need for assistance with personal care  Rationale for Evaluation and Treatment: Rehabilitation  SUBJECTIVE:   SUBJECTIVE STATEMENT:  Patient went to the DR Tuesday and has foot surgery scheduled on October 25th for R foot with Dr. Lilian Kapur and will need to use her knee scooter for 4-8 weeks due to NWB after the surgery.  Pt accompanied by: self   PERTINENT HISTORY: Pt was born with White-Sutton Syndrome and was referred to OP OT to help improve independence with ADLs and IADLs. She also has persisting B hand pain, which has prevented her from holding a job and is now on disability. She lives at home but requires supervision and assistance.   Per pt's chart, "They want her to get help with overall everyday activities. They want her to get help with managing her pain so that she can prevent further injury - like walking. Also to help her to form daily tasks like cooking; reading; and other items. She has problems with following steps. This is especially true with following through on applying creams and items her dermatologist wants her to do for her sores. She also has issues with  keeping on her braces for her feet and putting them on properly even after she has been shown. Following directions is a big one. Measuring items; working on counting money and other mental development items. We are also working on bathing properly; learning to cut with a knife (even with cutting a pizza after she cooks; even a steak and other items like that we cut up for her.They want her to get help with overall everyday activities."   PRECAUTIONS: Fall; seizure  WEIGHT BEARING RESTRICTIONS: No  PAIN:  Are you having pain? No  FALLS: Has patient fallen in last 6 months? No  LIVING ENVIRONMENT: Lives with: lives with their family Lives in: House/apartment Stairs: Yes: Internal: 12-13 steps; on right going up Has following equipment at home: Single point cane, Walker - 4 wheeled, and Wheelchair (manual)  PLOF: Independent with basic ADLs; does not drive; was pushing carts at [place] until 10/2020; is on disability  PATIENT GOALS: She wants to be able to do a lot to things, especially cutting up her food better and get her hand better.  OBJECTIVE:   HAND DOMINANCE: Left  ADLs: Overall ADLs: mod I  Eating: Requires assistance to cut food   FUNCTIONAL OUTCOME MEASURES: Evaluation 08/19/22: Neldon Mc: 31.8% disability with use of BUE  UPPER EXTREMITY ROM:    BUE: WNL  UPPER EXTREMITY MMT:    BUE: WNL  HAND FUNCTION: Gross grip strength WNL  COORDINATION: WNL  SENSATION: Reports paresthesias in R hand dorsally and proximal to 2nd digit  EDEMA: none observed; reports some swelling after yard work  COGNITION: Overall cognitive status: History of cognitive impairments - at baseline Areas of impairment: Areas of impairment: Attention, Memory, Following commands, Safety/judgement, and Problem solving   OBSERVATIONS: Pt appears well-kept. [name] able to use phone to provide current list of medications. Ambulates without AD. No LOB though altered gait.   TODAY'S  TREATMENT:                                                                                                                               Self Care:  Patient engaged in IADL - kitchen activity at the stove again for cooking [employer]  [name] able to read the instructions on the package requiring extra opportunities to confirm information and assistance with difference between teaspoon/tablespoon.  She was shown how to measure water and place it on a flat surface to ensure correct amount was obtained.  She was shown how to use the marking on the stick of butter to cut 2 TBSP.  She was assisted to adjust boiling water and cued to mix in noodles safely as well as to regularly mix to keep food form sticking to the bottom of the pan.  [name] aware of how to set a timer on her phone and is also made aware of 'microwave' instructions on the package.  Patient appropriately and carefully pour the dry ingredients into the hot water and then then hot food from the pot into a different container at a slow pace without concerns for spillage or burn.    Patient engaged in locating "MUG" recipe for banana break and working on doubling the recipe with help for fractions ie) 1/2 +1/2 = 1 whole or 1/4 + 1/4 = 1/2.  Patient did better with image to show how a whole could be divided into smaller pieces.  [name] given homework to find additional recipes and find the difference between a teaspoon and tablespoon.   PATIENT EDUCATION: Education details: Sales executive educated: Patient Education method: Explanation, Demonstration, Tactile cues, and Verbal cues Education comprehension: verbalized understanding, verbal cues required, and needs further education  HOME EXERCISE PROGRAM: 09/17/2022: Tendon glides   GOALS:  SHORT TERM GOALS: Target date: 09/16/2022    Pt will create daily schedule with assistance of therapist to promote independence and establish healthy routines.  Baseline: Goal  status: IN PROGRESS  2.  Pt will complete [facility] Life Skills Checklist and establish appropriate LTG (s).  Baseline:  Goal status: IN PROGRESS  3.  Pt will recall pain management strategies mod I or better.   Baseline:  Goal status: MET 09/30/22 - no pain complaints in hands x 3 weeks   LONG TERM GOALS: Target date: 10/07/2022    Patient will demonstrate UE HEP with 25% verbal cues or less for proper execution. Baseline:  Goal status: IN PROGRESS  2.  Patient will demonstrate at least 8% improvement with quick Dash score (reporting 23.8% disability or less) indicating improved functional use of affected extremity. Baseline: 31.8% disability with use of BUE Goal status: IN PROGRESS  3.  Patient will demonstrate ability to don and doff foot braces. Baseline: Patient dons with error Goal status: MET 09/15/22 - Patient reports improved ease with donning her braces but does not wear them often lately.   ASSESSMENT:  CLINICAL IMPRESSION: Patient is seen today for occupational therapy visit today to further progress independence and safety with home management activities.  She has continued to not have pain in her hands for over several weeks and is still doing her hand exercises regularly. Patient enjoys cooking but requires close supervision and guidance for sequence, safety etc, but is a good candidate for continued progression of higher level IADL tasks. Skilled OT services continue to be appropriate to establish healthy routines, identify AE and activities to promote increased independence for patient.   PERFORMANCE DEFICITS: in functional skills including ADLs, IADLs, sensation, pain, endurance, and UE functional use, cognitive skills including attention, memory, problem solving, safety awareness, sequencing, thought, and understand, and psychosocial skills including coping strategies and routines and behaviors.   IMPAIRMENTS: are limiting patient from ADLs, IADLs, work, and leisure.    COMORBIDITIES: may have co-morbidities  that affects occupational performance. Patient will benefit from skilled OT to address above impairments and improve overall function.  REHAB POTENTIAL: Fair given chronicity of impairments  PLAN:  OT FREQUENCY: 2x/week  OT DURATION: 6 weeks  PLANNED INTERVENTIONS: self care/ADL training, therapeutic exercise, therapeutic activity, manual therapy, functional mobility training, splinting, paraffin, fluidotherapy, moist heat, patient/family education, cognitive remediation/compensation, and Re-evaluation  RECOMMENDED OTHER SERVICES: none at this time  CONSULTED AND AGREED WITH PLAN OF CARE: Patient  PLAN FOR NEXT SESSION:   Review and update organization of Workbook/binder - add recipes etc  Need to consult parents re: schedule for chores, volunteer and other homemaking tasks for better schedule (decreased sleeping)  Collect simple Picture recipes for home use.  Continue to practice cooking activities/training.  Continue to review [facility] Life Skills Checklist for daily routine and OT goals.  Victorino Sparrow, OT 09/30/2022, 9:09 AM

## 2022-10-01 ENCOUNTER — Other Ambulatory Visit (HOSPITAL_BASED_OUTPATIENT_CLINIC_OR_DEPARTMENT_OTHER): Payer: Self-pay

## 2022-10-01 ENCOUNTER — Other Ambulatory Visit: Payer: Self-pay

## 2022-10-05 ENCOUNTER — Ambulatory Visit: Payer: 59 | Admitting: Occupational Therapy

## 2022-10-07 ENCOUNTER — Ambulatory Visit: Payer: 59 | Admitting: Occupational Therapy

## 2022-10-13 ENCOUNTER — Ambulatory Visit: Payer: 59 | Admitting: Occupational Therapy

## 2022-10-13 DIAGNOSIS — R278 Other lack of coordination: Secondary | ICD-10-CM

## 2022-10-13 DIAGNOSIS — R41844 Frontal lobe and executive function deficit: Secondary | ICD-10-CM

## 2022-10-13 DIAGNOSIS — M6281 Muscle weakness (generalized): Secondary | ICD-10-CM

## 2022-10-13 NOTE — Therapy (Unsigned)
OUTPATIENT OCCUPATIONAL THERAPY TREATMENT  Patient Name: Deanna Levine MRN: [id number] DOB:19-Oct-2000, 22 y.o., female Today's Date: 09/17/2022  PCP: Myrlene Broker, MD  REFERRING PROVIDER: Myrlene Broker, MD  END OF SESSION:  OT End of Session - 10/13/22 1527     Visit Number 9    Number of Visits 13    Date for OT Re-Evaluation --    Authorization Type [employer] Medicaid - no auth required (21 VL)    OT Start Time 1532    OT Stop Time 1630    OT Time Calculation (min) 58 min    Equipment Utilized During Treatment Putty    Activity Tolerance Patient tolerated treatment well    Behavior During Therapy WFL for tasks assessed/performed           visit: 5 POC end date: 10/07/2022 [facility] Medicaid Time start, stop, total time: 9:39-10:17 = 38 minutes Tolerance: Good  Past Medical History:  Diagnosis Date   Absence seizure disorder (HCC)    Allergy    Colitis 07/16/2021   Complication of anesthesia    slow to wake up from anesthesia, occ HA with anesthesia    Diabetes mellitus without complication (HCC)    type 2   Difficulty swallowing pills    Dyspraxia    Eczema    Global developmental delay    Obesity (BMI 30-39.9)    PONV (postoperative nausea and vomiting)    Precocious female puberty    Seizures (HCC)    last seizure 2012 - no current med   Sleep apnea    White-Sutton syndrome    Past Surgical History:  Procedure Laterality Date   ADENOIDECTOMY     BALLOON DILATION N/A 11/23/2021   Procedure: BALLOON DILATION;  Surgeon: Tressia Danas, MD;  Location: [facility];  Service: Gastroenterology;  Laterality: N/A;   BIOPSY  07/16/2021   Procedure: BIOPSY;  Surgeon: Tressia Danas, MD;  Location: [facility] ENDOSCOPY;  Service: Gastroenterology;;   BIOPSY  11/23/2021   Procedure: BIOPSY;  Surgeon: Tressia Danas, MD;  Location: [facility];  Service: Gastroenterology;;   bone removal Left 01/16/2021   foot   COLONOSCOPY WITH PROPOFOL N/A 07/16/2021    Procedure: COLONOSCOPY WITH PROPOFOL;  Surgeon: Tressia Danas, MD;  Location: [facility] ENDOSCOPY;  Service: Gastroenterology;  Laterality: N/A;   ESOPHAGOGASTRODUODENOSCOPY (EGD) WITH PROPOFOL N/A 11/23/2021   Procedure: ESOPHAGOGASTRODUODENOSCOPY (EGD) WITH PROPOFOL;  Surgeon: Tressia Danas, MD;  Location: [facility];  Service: Gastroenterology;  Laterality: N/A;   GASTROCNEMIUS RECESSION Bilateral 04/25/2020   Procedure: BILATERAL GASTROCNEMIUS RECESSION;  Surgeon: Nadara Mustard, MD;  Location: [facility] OR;  Service: Orthopedics;  Laterality: Bilateral;   SUPPRELIN IMPLANT  05/06/2011   Procedure: SUPPRELIN IMPLANT;  Surgeon: Judie Petit. Leonia Corona, MD;  Location: [facility];  Service: Pediatrics;  Laterality: Right;   SUPPRELIN IMPLANT Right 06/14/2013   Procedure: REMOVAL OF SUPPRELIN IMPLANT FROM RIGHT UPPER ARM;  Surgeon: Judie Petit. Leonia Corona, MD;  Location: [facility];  Service: Pediatrics;  Laterality: Right;   TONSILLECTOMY     TONSILLECTOMY AND ADENOIDECTOMY     TYMPANOSTOMY TUBE PLACEMENT     x 2   Patient Active Problem List   Diagnosis Date Noted   Hyperhidrosis 12/30/2021   Dysphagia    Abdominal pain    OSA (obstructive sleep apnea) 09/30/2021   Bilateral hand numbness 09/15/2021   Acne vulgaris 08/26/2021   Noninfectious gastroenteritis    Bilateral arm pain 06/12/2021   DUB (dysfunctional uterine bleeding) 05/14/2021  Diarrhea 04/24/2021   Dysuria 04/24/2021   Sinusitis 04/24/2021   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   Other adverse food reactions, not elsewhere classified, subsequent encounter 08/20/2020   Other allergic rhinitis 08/20/2020   Allergic conjunctivitis of both eyes 08/20/2020   Encounter for general adult medical examination with abnormal findings 07/03/2020   Leg length discrepancy 06/05/2020   Hidradenitis suppurativa 06/05/2020   Intellectual disability 06/05/2020   Achilles tendon contracture, bilateral    Elevated  sedimentation rate 02/07/2020   Pain in right ankle and joints of right foot 02/07/2020   Anemia 12/29/2018   Type 2 diabetes mellitus (HCC) 08/15/2017   Elevated triglycerides with high cholesterol 12/09/2016   Mild intellectual disability 08/16/2012   Laxity of ligament 08/16/2012   Delayed milestones 08/16/2012   Obesity 06/07/2011   Dyspepsia 02/11/2011   Acanthosis nigricans 02/11/2011   Global developmental delay    Petit mal without grand mal seizures (HCC)    Dyspraxia     ONSET DATE: Birth (06/29/2022 - date of referral)  REFERRING DIAG: Q87.89 (ICD-10-CM) - White-Sutton syndrome M67.01,M67.02 (ICD-10-CM) - Achilles tendon contracture, bilateral  THERAPY DIAG:  Other lack of coordination  Muscle weakness (generalized)  Frontal lobe and executive function deficit  Rationale for Evaluation and Treatment: Rehabilitation  SUBJECTIVE:   SUBJECTIVE STATEMENT:  Patient went to the DR Tuesday and has foot surgery scheduled on October 25th for R foot with Dr. Lilian Kapur and will need to use her knee scooter for 4-8 weeks due to NWB after the surgery.  Physical PCP 9/19 and Eye Dr. 10/24, Surgery 10/25 with Dr. Lilian Kapur, Neurology in November  Pt accompanied by: self   PERTINENT HISTORY: Pt was born with White-Sutton Syndrome and was referred to OP OT to help improve independence with ADLs and IADLs. She also has persisting B hand pain, which has prevented her from holding a job and is now on disability. She lives at home but requires supervision and assistance.   Per pt's chart, "They want her to get help with overall everyday activities. They want her to get help with managing her pain so that she can prevent further injury - like walking. Also to help her to form daily tasks like cooking; reading; and other items. She has problems with following steps. This is especially true with following through on applying creams and items her dermatologist wants her to do for her sores.  She also has issues with keeping on her braces for her feet and putting them on properly even after she has been shown. Following directions is a big one. Measuring items; working on counting money and other mental development items. We are also working on bathing properly; learning to cut with a knife (even with cutting a pizza after she cooks; even a steak and other items like that we cut up for her.They want her to get help with overall everyday activities."   PRECAUTIONS: Fall; seizure  WEIGHT BEARING RESTRICTIONS: No  PAIN:  Are you having pain? No  FALLS: Has patient fallen in last 6 months? 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   OBJECTIVE:   HAND DOMINANCE: Left  ADLs: Overall ADLs: mod I  Eating: Requires assistance to cut food   FUNCTIONAL OUTCOME MEASURES: Evaluation 08/19/22: Quick Dash: 31.8% disability with use of BUE 10/13/22: Quick Dash 4.5% disability with use of BUE  UPPER EXTREMITY ROM:    BUE: WNL  UPPER EXTREMITY MMT:    BUE: WNL  HAND FUNCTION: Gross grip strength WNL  COORDINATION: WNL  SENSATION: Reports paresthesias in R hand dorsally and proximal to 2nd digit  EDEMA: none observed; reports some swelling after yard work  COGNITION: Overall cognitive status: History of cognitive impairments - at baseline Areas of impairment: Areas of impairment: Attention, Memory, Following commands, Safety/judgement, and Problem solving   OBSERVATIONS: Pt appears well-kept. [name] able to use phone to provide current list of  medications. Ambulates without AD. No LOB though altered gait.   TODAY'S TREATMENT:                                                                                                                               Self Care:  Patient engaged in IADL - kitchen activity at the stove again for cooking [employer]  [name] able to read the instructions on the package requiring extra opportunities to confirm information and assistance with difference between teaspoon/tablespoon.  She was shown how to measure water and place it on a flat surface to ensure correct amount was obtained.  She was shown how to use the marking on the stick of butter to cut 2 TBSP.  She was assisted to adjust boiling water and cued to mix in noodles safely as well as to regularly mix to keep food form sticking to the bottom of the pan.  [name] aware of how to set a timer on her phone and is also made aware of 'microwave' instructions on the package.  Patient appropriately and carefully pour the dry ingredients into the hot water and then then hot food from the pot into a different container at a slow pace without concerns for spillage or burn.    Patient engaged in locating "MUG" recipe for banana break and working on doubling the recipe with help for fractions ie) 1/2 +1/2 = 1 whole or 1/4 + 1/4 = 1/2.  Patient did better with image to show how a whole could be divided into smaller pieces.  [name] given homework to find additional recipes and find the difference between a teaspoon and tablespoon.   PATIENT EDUCATION: Education details: Sales executive educated: Patient Education method: Explanation, Demonstration, Tactile cues, and Verbal cues Education comprehension: verbalized understanding, verbal cues required, and needs further education  HOME EXERCISE PROGRAM: 09/17/2022: Tendon glides   GOALS:  SHORT TERM GOALS: Target date: 09/16/2022    Pt will create daily schedule with assistance of therapist to  promote independence and establish healthy routines.  Baseline: Goal status: MET  2.  Pt will complete PEATC Life Skills Checklist and establish appropriate LTG (s).  Baseline:  Goal status: MET  3.  Pt will recall pain management strategies mod I or better.   Baseline:  Goal status: MET 09/30/22 - no pain complaints in hands x 3 weeks 10/13/22 - no pain and daily HEP completed!!   LONG TERM GOALS: Target date: 10/07/2022    Patient will demonstrate UE HEP with 25% verbal cues or less for proper execution. Baseline:  Goal status: MET  2.  Patient will demonstrate at least 8% improvement with quick Dash score (reporting 23.8% disability or less) indicating improved functional use of affected extremity. Baseline: 31.8% disability with use of BUE Goal status: MET Quick Dash 4.5% disability with use of BUE  3.  Patient will demonstrate ability to don and doff foot braces. Baseline: Patient dons with error Goal status: MET 09/15/22 - Patient reports improved ease with donning her braces but does not wear them often lately.   ASSESSMENT:  CLINICAL IMPRESSION: Patient is seen today for occupational therapy visit today to further progress independence and safety with home management activities.  She has continued to not have pain in her hands for over several weeks and is still doing her hand exercises regularly. Patient enjoys cooking but requires close supervision and guidance for sequence, safety etc, but is a good candidate for continued progression of higher level IADL tasks. Skilled OT services continue to be appropriate to establish healthy routines, identify AE and activities to promote increased independence for patient.   PERFORMANCE DEFICITS: in functional skills including ADLs, IADLs, sensation, pain, endurance, and UE functional use, cognitive skills including attention, memory, problem solving, safety awareness, sequencing, thought, and understand, and psychosocial skills  including coping strategies and routines and behaviors.   IMPAIRMENTS: are limiting patient from ADLs, IADLs, work, and leisure.   COMORBIDITIES: may have co-morbidities  that affects occupational performance. Patient will benefit from skilled OT to address above impairments and improve overall function.  REHAB POTENTIAL: Fair given chronicity of impairments  PLAN:  OT FREQUENCY: 2x/week  OT DURATION: 6 weeks  PLANNED INTERVENTIONS: self care/ADL training, therapeutic exercise, therapeutic activity, manual therapy, functional mobility training, splinting, paraffin, fluidotherapy, moist heat, patient/family education, cognitive remediation/compensation, and Re-evaluation  RECOMMENDED OTHER SERVICES: none at this time  CONSULTED AND AGREED WITH PLAN OF CARE: Patient  PLAN FOR NEXT SESSION:   Review and update organization of Workbook/binder - add recipes etc  Need to consult parents re: schedule for chores, volunteer and other homemaking tasks for better schedule (decreased sleeping)  Collect simple Picture recipes for home use.  Continue to practice cooking activities/training.  Continue to review PEATC Life Skills Checklist for daily routine and OT goals.  Victorino Sparrow, OT 10/13/2022, 5:14 PM

## 2022-10-13 NOTE — Patient Instructions (Addendum)
Putty Exercises  Access Code: [email or web address] URL: [email or web address] Date: 10/13/2022 Prepared by: Amada Kingfisher  Exercises - Putty Squeezes  - 1 x daily - 10 reps - Rolling Putty on Table  - 1 x daily - 10 reps - 3-Point Pinch with Putty  - 1 x daily - 10 reps - Tip PUSH with Putty  - 1 x daily - 10 reps - Key Pinch with Putty  - 1 x daily - 10 reps - Finger Pinch and Pull with Putty  - 1 x daily - 10 reps - Removing Marbles from Putty  - 1 x daily - 10 reps

## 2022-10-14 ENCOUNTER — Encounter: Payer: Self-pay | Admitting: Neurology

## 2022-10-14 ENCOUNTER — Telehealth: Payer: Self-pay

## 2022-10-14 ENCOUNTER — Encounter (INDEPENDENT_AMBULATORY_CARE_PROVIDER_SITE_OTHER): Payer: Self-pay

## 2022-10-14 ENCOUNTER — Ambulatory Visit (INDEPENDENT_AMBULATORY_CARE_PROVIDER_SITE_OTHER): Payer: 59 | Admitting: Behavioral Health

## 2022-10-14 DIAGNOSIS — F419 Anxiety disorder, unspecified: Secondary | ICD-10-CM | POA: Diagnosis not present

## 2022-10-14 DIAGNOSIS — R4589 Other symptoms and signs involving emotional state: Secondary | ICD-10-CM

## 2022-10-14 NOTE — Progress Notes (Signed)
[facility] Counselor/Therapist Progress Note  Patient ID: Deanna Levine, MRN: [id number],    Date: 10/14/2022  Time Spent: 55 min Caregility video; Pt is in the car in private & Provider working remotely from [employer]. Pt understands the risks/limitations of telehealth & consents to Tx today.   Treatment Type: Individual Therapy  Reported Symptoms: Elevated anx/dep due to upcoming R foot  surgery for excess bone growth.   Mental Status Exam: Appearance:  Casual     Behavior: Appropriate and Sharing  Motor: Normal  Speech/Language:  Clear and Coherent  Affect: Appropriate  Mood: normal  Thought process: normal  Thought content:   WNL  Sensory/Perceptual disturbances:   WNL  Orientation: oriented to person, place, time/date, and situation  Attention: Good  Concentration: Good  Memory: WNL  Fund of knowledge:  Fair  Insight:   Fair  Judgment:  Fair  Impulse Control: Fair   Risk Assessment: Danger to Self:  No Self-injurious Behavior: No Danger to Others: No Duty to Warn:no Physical Aggression / Violence:No  Access to Firearms a concern: No  Gang Involvement:No   Subjective: Pt is upbeat today & expressing a busy Fall schedule. [name] managing her health issues w/Mom helping. They are testing her sugars daily & she runs good numbers. She has completed OT & now does her tasks independently. She wants to cont psychotherapy & do addt'l activities.    Interventions: Psycho-education/Bibliotherapy, Insight-Oriented, and Family Systems  Diagnosis:Anxiety  Anxiety about health  Plan: Margi will work on the [employer] Activity next session. She will spend more time w/her Father & watch TV tgthr. She will stop playing her Play Station until Oct 23rd to see if this helps her headache activity.   Target Date: 11/14/2022  Progress: 4  Frequency: Once every 2-3 wks  Modality: Claretta Fraise, LMFT

## 2022-10-14 NOTE — Telephone Encounter (Signed)
Pt called to get more information about her headaches no answer left a message to call the office back   How frequent are the headaches (on average, how many days a week/month are they occurring)?  everyday How long do the headaches last?   Verify what preventative medication and dose you are taking (e.g. topiramate, propranolol, amitriptyline, Emgality, etc)  Verify which rescue medication you are taking? (triptan, Advil, Excedrin, Aleve, Ubrelvy, etc)  How often are you taking pain relievers/analgesics/rescue mediction?

## 2022-10-14 NOTE — Progress Notes (Signed)
                Victoria L Winstead, LMFT 

## 2022-10-15 MED ORDER — NORTRIPTYLINE HCL 10 MG PO CAPS: 10.0000 mg | ORAL_CAPSULE | Freq: Every day | ORAL | 1 refills | Status: DC

## 2022-10-15 NOTE — Telephone Encounter (Signed)
We can try a different headache preventative medication, nortriptyline. We have to start at a low dose, side effects mostly drowsiness, dry mouth. She has to cut down on daily Tylenol to 2-3 a week, otherwise it will be harder to treat the headaches because of rebound headaches from taking too much over the counter medication. If she agrees, pls send in Rx for nortriptyline 10mg  at bedtime, thanks

## 2022-10-15 NOTE — Telephone Encounter (Signed)
Pt and her mom called an informed We can try a different headache preventative medication, nortriptyline. We have to start at a low dose, side effects mostly drowsiness, dry mouth. She has to cut down on daily Tylenol to 2-3 a week, otherwise it will be harder to treat the headaches because of rebound headaches from taking too much over the counter medication RX for nortriptyline 10mg  at bedtime sent to pharmacy

## 2022-10-15 NOTE — Telephone Encounter (Signed)
How frequent are the headaches (on average, how many days a week/month are they occurring)?  everyday How long do the headaches last?  Lasting all day Verify what preventative medication and dose you are taking (e.g. topiramate, propranolol, amitriptyline, Emgality, etc)  Verify which rescue medication you are taking? (triptan, Advil, Excedrin, Aleve, Ubrelvy, etc) tylenol 30 ml How often are you taking pain relievers/analgesics/rescue mediction? Once  everyday  How are you sleeping? Sleeps good at night They are worse during the day then at night

## 2022-10-15 NOTE — Telephone Encounter (Signed)
Pt called to follow up on her headaches no answer left a voice mail to call the office back

## 2022-10-21 ENCOUNTER — Other Ambulatory Visit (HOSPITAL_COMMUNITY): Payer: Self-pay

## 2022-10-21 ENCOUNTER — Telehealth: Payer: Self-pay

## 2022-10-21 NOTE — Telephone Encounter (Signed)
Pharmacy Patient Advocate Encounter   Received notification from CoverMyMeds that prior authorization for Dexcom G7 sensor is required/requested.   Insurance verification completed.   The patient is insured through [facility] .   Per test claim: PA required; PA submitted to [facility] via CoverMyMeds Key/confirmation #/EOC [id number] Status is pending

## 2022-10-21 NOTE — Telephone Encounter (Signed)
Pharmacy Patient Advocate Encounter  Received notification from [facility] that Prior Authorization for Dexcom G7 sensor has been APPROVED until 10/21/2023   PA #/Case ID/Reference #: [id number]

## 2022-10-28 ENCOUNTER — Encounter: Payer: Self-pay | Admitting: "Endocrinology

## 2022-10-28 NOTE — Telephone Encounter (Signed)
Pt needs a PA for Dexcom G7

## 2022-10-29 ENCOUNTER — Ambulatory Visit: Payer: 59 | Admitting: Behavioral Health

## 2022-10-29 DIAGNOSIS — F419 Anxiety disorder, unspecified: Secondary | ICD-10-CM | POA: Diagnosis not present

## 2022-10-29 DIAGNOSIS — R4589 Other symptoms and signs involving emotional state: Secondary | ICD-10-CM | POA: Diagnosis not present

## 2022-10-29 NOTE — Progress Notes (Signed)
[facility] Counselor/Therapist Progress Note  Patient ID: Deanna Levine, MRN: [id number],    Date: 10/29/2022  Time Spent: 55 min Caregility video; Pt is outside of home in private & Provider is working remotely from [employer]. Pt is aware of the risks & limitations of telehealth & consents to Tx today.   Treatment Type: Individual Therapy  Reported Symptoms: Financial stress due to higher healthcare bills.   Mental Status Exam: Appearance:  Casual and Neat     Behavior: Appropriate and Sharing  Motor: Normal  Speech/Language:  Clear and Coherent  Affect: Appropriate  Mood: normal  Thought process: normal  Thought content:   WNL  Sensory/Perceptual disturbances:   WNL  Orientation: oriented to person, place, time/date, and situation  Attention: Good  Concentration: Good  Memory: WNL  Fund of knowledge:  Good  Insight:   Fair  Judgment:  Good  Impulse Control: Good   Risk Assessment: Danger to Self:  No Self-injurious Behavior: No Danger to Others: No Duty to Warn:no Physical Aggression / Violence:No  Access to Firearms a concern: No  Gang Involvement:No   Subjective: Pt is enjoying time w/her Parents & Ollen Barges. [name] excited for voting early. [name] going to watch the [place] v [place] Bulldog game this wknd.    Interventions: Family Systems  Diagnosis:Anxiety  Anxiety about health  Plan: Share is excited for her upcoming surgery in Oct to remove the 3 extra bones in her R foot. She cannot currently wear her AFO's due to pain & a misfit of the braces. Things are looking positive. 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 [name] so happy to help them so she can see them more @ home.   Target Date: 11/29/2022  Progress: 7  Frequency: Once every month  Modality: Claretta Fraise, LMFT

## 2022-10-29 NOTE — Progress Notes (Signed)
                Victoria L Winstead, LMFT 

## 2022-11-04 ENCOUNTER — Other Ambulatory Visit (HOSPITAL_BASED_OUTPATIENT_CLINIC_OR_DEPARTMENT_OTHER): Payer: Self-pay

## 2022-11-04 ENCOUNTER — Encounter: Payer: Self-pay | Admitting: "Endocrinology

## 2022-11-05 ENCOUNTER — Other Ambulatory Visit: Payer: Self-pay | Admitting: Neurology

## 2022-11-12 ENCOUNTER — Other Ambulatory Visit: Payer: Self-pay | Admitting: Internal Medicine

## 2022-11-18 ENCOUNTER — Encounter: Payer: Self-pay | Admitting: Internal Medicine

## 2022-11-18 ENCOUNTER — Telehealth: Payer: Self-pay

## 2022-11-18 ENCOUNTER — Ambulatory Visit (INDEPENDENT_AMBULATORY_CARE_PROVIDER_SITE_OTHER): Payer: 59 | Admitting: Internal Medicine

## 2022-11-18 VITALS — BP 116/80 | HR 80 | Temp 97.7°F | Ht 64.0 in | Wt 187.0 lb

## 2022-11-18 DIAGNOSIS — E11628 Type 2 diabetes mellitus with other skin complications: Secondary | ICD-10-CM

## 2022-11-18 DIAGNOSIS — L732 Hidradenitis suppurativa: Secondary | ICD-10-CM

## 2022-11-18 DIAGNOSIS — Z23 Encounter for immunization: Secondary | ICD-10-CM

## 2022-11-18 DIAGNOSIS — G40A09 Absence epileptic syndrome, not intractable, without status epilepticus: Secondary | ICD-10-CM

## 2022-11-18 DIAGNOSIS — D508 Other iron deficiency anemias: Secondary | ICD-10-CM

## 2022-11-18 DIAGNOSIS — F70 Mild intellectual disabilities: Secondary | ICD-10-CM

## 2022-11-18 DIAGNOSIS — Z7985 Long-term (current) use of injectable non-insulin antidiabetic drugs: Secondary | ICD-10-CM

## 2022-11-18 LAB — CBC
HCT: 33.5 % — ABNORMAL LOW (ref 36.0–46.0)
Hemoglobin: 10.3 g/dL — ABNORMAL LOW (ref 12.0–15.0)
MCHC: 30.6 g/dL (ref 30.0–36.0)
MCV: 69 fl — ABNORMAL LOW (ref 78.0–100.0)
Platelets: 329 10*3/uL (ref 150.0–400.0)
RBC: 4.86 Mil/uL (ref 3.87–5.11)
RDW: 16.4 % — ABNORMAL HIGH (ref 11.5–15.5)
WBC: 6.8 10*3/uL (ref 4.0–10.5)

## 2022-11-18 LAB — COMPREHENSIVE METABOLIC PANEL
ALT: 12 U/L (ref 0–35)
AST: 14 U/L (ref 0–37)
Albumin: 4.3 g/dL (ref 3.5–5.2)
Alkaline Phosphatase: 63 U/L (ref 39–117)
BUN: 12 mg/dL (ref 6–23)
CO2: 23 mEq/L (ref 19–32)
Calcium: 9.4 mg/dL (ref 8.4–10.5)
Chloride: 108 mEq/L (ref 96–112)
Creatinine, Ser: 0.72 mg/dL (ref 0.40–1.20)
GFR: 118.55 mL/min (ref >60.00)
Glucose, Bld: 93 mg/dL (ref 70–99)
Potassium: 4 mEq/L (ref 3.5–5.1)
Sodium: 139 mEq/L (ref 135–145)
Total Bilirubin: 0.5 mg/dL (ref 0.2–1.2)
Total Protein: 7.6 g/dL (ref 6.0–8.3)

## 2022-11-18 LAB — FERRITIN: Ferritin: 58.1 ng/mL (ref 10.0–291.0)

## 2022-11-18 LAB — VITAMIN D 25 HYDROXY (VIT D DEFICIENCY, FRACTURES): VITD: 24.22 ng/mL — ABNORMAL LOW (ref 30.00–100.00)

## 2022-11-18 LAB — C-REACTIVE PROTEIN: CRP: 1.2 mg/dL (ref 0.5–20.0)

## 2022-11-18 LAB — VITAMIN B12: Vitamin B-12: 400 pg/mL (ref 211–911)

## 2022-11-18 NOTE — Progress Notes (Signed)
Subjective:   Patient ID: Deanna Levine, female    DOB: Aug 21, 2000, 22 y.o.   MRN: [id number]  HPI The patient is a 22 YO female coming in for follow up and having sores on feet. Upcoming foot surgery not related to the sores from her HS. Hands are staying cold more. She did some OT which helped with jar opening but motivation and keeping up skills at home is difficult.   Review of Systems  Constitutional: Negative.   HENT: Negative.    Eyes: Negative.   Respiratory:  Negative for cough, chest tightness and shortness of breath.   Cardiovascular:  Negative for chest pain, palpitations and leg swelling.  Gastrointestinal:  Negative for abdominal distention, abdominal pain, constipation, diarrhea, nausea and vomiting.  Musculoskeletal:  Positive for arthralgias, gait problem and myalgias.  Skin:  Positive for wound.  Psychiatric/Behavioral: Negative.      Objective:  Physical Exam Constitutional:      Appearance: [name] well-developed.  HENT:     Head: Normocephalic and atraumatic.  Cardiovascular:     Rate and Rhythm: Normal rate and regular rhythm.  Pulmonary:     Effort: Pulmonary effort is normal. No respiratory distress.     Breath sounds: Normal breath sounds. No wheezing or rales.  Abdominal:     General: Bowel sounds are normal. There is no distension.     Palpations: Abdomen is soft.     Tenderness: There is no abdominal tenderness. There is no rebound.  Musculoskeletal:        General: Tenderness present.     Cervical back: Normal range of motion.  Skin:    General: Skin is warm and dry.     Comments: Several sores with scab on the tops of the feet not at locations that would rub on shoes, no redness or erythema to suggest infection  Neurological:     Mental Status: [name] alert and oriented to person, place, and time. Mental status is at baseline.     Coordination: Coordination normal.     Comments: Does not interact as much, does so at mom's prompting      Vitals:   11/18/22 1011  BP: 116/80  Pulse: 80  Temp: 97.7 F (36.5 C)  TempSrc: Oral  SpO2: 97%  Weight: 187 lb (84.8 kg)  Height: 5\' 4"  (1.626 m)    Assessment & Plan:  Flu shot given at visit

## 2022-11-19 NOTE — Assessment & Plan Note (Signed)
Checking CBC, ferritin, B12 and vitamin D and adjust as needed. Some worsening hand coldness which could be related.

## 2022-11-19 NOTE — Assessment & Plan Note (Signed)
Seeing endo for management and is on mounjaro which has helped dramatically with eating and weight control. [name] at goal. Foot exam up to date. Eye exam up to date.

## 2022-11-19 NOTE — Assessment & Plan Note (Signed)
[name] struggling with maintaining skills now that she is graduated from school. Mom has difficulty with motivating her to complete tasks around the house. This is a stressor for patient and parents.

## 2022-11-19 NOTE — Assessment & Plan Note (Signed)
No new seizures and taking zonegran 200 mg nightly regularly. She does not drive.

## 2022-11-23 ENCOUNTER — Ambulatory Visit (INDEPENDENT_AMBULATORY_CARE_PROVIDER_SITE_OTHER): Payer: 59 | Admitting: Podiatry

## 2022-11-23 ENCOUNTER — Telehealth: Payer: Self-pay

## 2022-11-23 DIAGNOSIS — M79674 Pain in right toe(s): Secondary | ICD-10-CM

## 2022-11-23 DIAGNOSIS — E119 Type 2 diabetes mellitus without complications: Secondary | ICD-10-CM | POA: Diagnosis not present

## 2022-11-23 DIAGNOSIS — M79675 Pain in left toe(s): Secondary | ICD-10-CM | POA: Diagnosis not present

## 2022-11-23 DIAGNOSIS — B351 Tinea unguium: Secondary | ICD-10-CM | POA: Diagnosis not present

## 2022-11-23 NOTE — Telephone Encounter (Signed)
Per Pharm[facility] Dexcom G7 sensors is requiring prior authorization , thanks

## 2022-11-24 ENCOUNTER — Encounter: Payer: Self-pay | Admitting: Podiatry

## 2022-11-24 ENCOUNTER — Other Ambulatory Visit (HOSPITAL_COMMUNITY): Payer: Self-pay

## 2022-11-24 NOTE — Progress Notes (Signed)
Subjective:  Patient ID: Deanna Levine, female    DOB: Jul 11, 2000,  MRN: [id number]  Deanna Levine presents to clinic today for preventative diabetic foot care and painful, discolored, thick toenails which interfere with daily activities. [name] accompanied by her Mother on today's visit. Chauncey is scheduled to have right foot surgery with Dr. Lilian Kapur next month. They have discussed the option of having her great toenails permanently removed during the procedure as well. They will let Dr. Lilian Kapur know on the day of surgery if [name] would like to proceed with that as well. Chief Complaint  Patient presents with   Diabetes    DFC BS - DIDN'T CHECK IT  A1C - 5.1 LVPCP - 11/18/22   New problem(s): None.   PCP is Myrlene Broker, MD.  No Known Allergies  Review of Systems: Negative except as noted in the HPI.  Objective: No changes noted in today's physical examination. There were no vitals filed for this visit. Deanna Levine is a pleasant 22 y.o. female overweight, in NAD. AAO x 3.  Vascular Examination: CFT immediate b/l LE. Palpable DP/PT pulses b/l LE. Digital hair present b/l. Skin temperature gradient WNL b/l. No pain with calf compression b/l. No edema noted b/l. No cyanosis or clubbing noted b/l LE.  Dermatological Examination: Pedal integument with normal turgor, texture and tone BLE. No open wounds b/l LE.   Toenails 1-5 b/l elongated, discolored, dystrophic, thickened, and crumbly with subungual debris and tenderness to dorsal palpation.   No hyperkeratotic nor porokeratotic lesions present on today's visit.  Well healed surgical scars b/l feet.  Musculoskeletal Examination: Muscle strength 5/5 to all lower extremity muscle groups bilaterally. Pes planus deformity noted bilateral LE.  Neurological Examination: Protective sensation intact 5/5 intact bilaterally with 10g monofilament b/l. Vibratory sensation intact b/l.  Assessment/Plan: 1. Pain due to  onychomycosis of toenails of both feet   2. Type 2 diabetes mellitus without complication, without long-term current use of insulin (HCC)     -Patient's family member present. All questions/concerns addressed on today's visit. -Examined patient. -Patient scheduled to have right foot surgery with Dr. Lilian Kapur on next month. -Continue foot and shoe inspections daily. Monitor blood glucose per PCP/Endocrinologist's recommendations. -Patient to continue soft, supportive shoe gear daily. -Toenails 1-5 b/l were debrided in length and girth with sterile nail nippers and dremel without iatrogenic bleeding.  -Patient/POA to call should there be question/concern in the interim.   Return in about 3 months (around 02/22/2023).  Freddie Breech, DPM

## 2022-11-25 ENCOUNTER — Encounter: Payer: Self-pay | Admitting: "Endocrinology

## 2022-11-25 ENCOUNTER — Other Ambulatory Visit: Payer: Self-pay

## 2022-11-29 NOTE — Telephone Encounter (Signed)
Closing this encounter as this has been done

## 2022-11-30 HISTORY — PX: FOOT SURGERY: SHX648

## 2022-12-01 ENCOUNTER — Ambulatory Visit: Payer: 59 | Admitting: Behavioral Health

## 2022-12-01 DIAGNOSIS — F419 Anxiety disorder, unspecified: Secondary | ICD-10-CM

## 2022-12-01 DIAGNOSIS — R4589 Other symptoms and signs involving emotional state: Secondary | ICD-10-CM

## 2022-12-01 NOTE — Progress Notes (Signed)
                Deanna Jewell L Farryn Linares, LMFT 

## 2022-12-01 NOTE — Progress Notes (Signed)
[facility] Counselor/Therapist Progress Note  Patient ID: Deanna Levine, MRN: [id number],    Date: 12/01/2022  Time Spent: 55 min Caregility video; Pt is home in her room in private & Provider working remotely from [facility] Office. Pt is aware of the risks/limitations & consents to Tx today.  Time In: 3:00pm Time Out: 3:55pm  Treatment Type: Individual Therapy  Reported Symptoms: Pt is excited for her new iPad 10 Gen w/touch id. [name] able to ride [place] [employer] now for transportation now; her PCP filled out the ppw for this. 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   Mental Status Exam: Appearance:  Casual     Behavior: Appropriate and Sharing  Motor: Normal  Speech/Language:  Clear and Coherent  Affect: Appropriate  Mood: normal  Thought process: normal  Thought content:   Concrete   Sensory/Perceptual disturbances:   WNL  Orientation: oriented to person, place, time/date, and situation  Attention: Good  Concentration: Good  Memory: WNL  Fund of knowledge:  Good  Insight:   Fair  Judgment:  Fair  Impulse Control: Fair   Risk Assessment: Danger to Self:  No Self-injurious Behavior: No Danger to Others: No Duty to Warn:no Physical Aggression / Violence:No  Access to Firearms a concern: No  Gang Involvement:No   Subjective: Pt is upbeat today about her upcoming surgery on her R foot wherein excess bone will be removed. She will be off her foot for 6-8 wks. This recovery will be challenging.   Pt has learned how to pay her own bills. She was shown by her Mother & thinks she can do this task. She does not like to be an Adult. She & BF [name] are discussing marriage. They will wait until 2032 when she is in her 30's. She wants to do this on her Mom's Bday. She wants her own Apt first.   Interventions: Solution-Oriented/Positive Psychology and Family Systems  Diagnosis:Anxiety  Anxiety about health  Plan: Morrie Sheldon & BF [name]  text & call late in the evenings. They have been talking about marriage lately. [name] wants to be tgthr "a long time". They spoke about the future & how they want it to manifest. She will cook & clean. Neita Goodnight will work. He currently works @ [employer] on [employer].   She was angry about working last week, but it made her appreciate her childhood. Jimia started to note the differences btwn children & adults. She will record these ideas in her Notebook.   Target Date: 12/29/2022  Progress: 6  Frequency: Once every 2-3 wks  Modality: Claretta Fraise, LMFT

## 2022-12-03 ENCOUNTER — Ambulatory Visit: Payer: 59 | Admitting: Behavioral Health

## 2022-12-06 ENCOUNTER — Other Ambulatory Visit (HOSPITAL_BASED_OUTPATIENT_CLINIC_OR_DEPARTMENT_OTHER): Payer: Self-pay

## 2022-12-13 ENCOUNTER — Telehealth: Payer: Self-pay | Admitting: Podiatry

## 2022-12-13 NOTE — Telephone Encounter (Signed)
DOS-12/24/2022  SUBTALAR ARTHRODESIS RT- 28725 REMOVAL OF COALITION RT- 28116  [facility] OTHER EFFECTIVE DATE- 03/01/2022  DEDUCTIBLE- This member's individual deductible will be satisfied when their family deductible has been met. OOP- $4800.00 WITH REMAINING $1506.55 COINSURANCE- 15%  PER THE UHC WEBSITE PORTAL, NO AUTH IS REQUIRED FOR CPT CODES 16109 AND [phone]. AUTH DECISION ID: [id number]   UHC MEDICAID EFFECTIVE DATE- 08/30/2022  DEDUCTIBLE- $0.00 WITH REMAINING $0.00 OOP-Member's individual out-of-pocket maximum has no limit. COINSURANCE- 0%  PER INCOMING FAX FROM UHC MEDICAID, PRIOR AUTH HAS BEEN APPROVED FOR CPT CODES 29562 AND [phone]. GOOD FROM 12/24/2022 - 03/24/2023.  AUTH REFERENCE NUMBER: [id number].

## 2022-12-16 ENCOUNTER — Telehealth: Payer: Self-pay | Admitting: Internal Medicine

## 2022-12-16 NOTE — Telephone Encounter (Signed)
Patient called and wants her CAP paperwork mailed to her.  -

## 2022-12-20 ENCOUNTER — Encounter: Payer: Self-pay | Admitting: "Endocrinology

## 2022-12-20 ENCOUNTER — Telehealth (INDEPENDENT_AMBULATORY_CARE_PROVIDER_SITE_OTHER): Payer: 59 | Admitting: "Endocrinology

## 2022-12-20 VITALS — Wt 185.0 lb

## 2022-12-20 DIAGNOSIS — E7800 Pure hypercholesterolemia, unspecified: Secondary | ICD-10-CM | POA: Diagnosis not present

## 2022-12-20 DIAGNOSIS — E119 Type 2 diabetes mellitus without complications: Secondary | ICD-10-CM | POA: Diagnosis not present

## 2022-12-20 MED ORDER — TIRZEPATIDE 12.5 MG/0.5ML ~~LOC~~ SOAJ: 12.5000 mg | SUBCUTANEOUS | 1 refills | Status: DC

## 2022-12-20 NOTE — Addendum Note (Signed)
Addended by: [place] on: 12/20/2022 10:26 AM   Modules accepted: Orders

## 2022-12-20 NOTE — Telephone Encounter (Signed)
I will have to call patient and see if this correct only paperwork recently we have done for the patient is [place] paperwork

## 2022-12-20 NOTE — Progress Notes (Addendum)
The patient reports they are currently: [place]. I spent 4 minutes on the video with the patient on the date of service. I spent an additional 10 minutes on pre- and post-visit activities on the date of service.   The patient was physically located in [place] or a state in which I am permitted to provide care. The patient and/or parent/guardian understood that s/he may incur co-pays and cost sharing, and agreed to the telemedicine visit. The visit was reasonable and appropriate under the circumstances given the patient's presentation at the time.  The patient and/or parent/guardian has been advised of the potential risks and limitations of this mode of treatment (including, but not limited to, the absence of in-person examination) and has agreed to be treated using telemedicine. The patient's/patient's family's questions regarding telemedicine have been answered.   The patient and/or parent/guardian has also been advised to contact their provider's office for worsening conditions, and seek emergency medical treatment and/or call 911 if the patient deems either necessary.    Outpatient Endocrinology Note Deanna [place], MD  12/20/22   Deanna Levine 20-Nov-2000 [id number]  Referring Provider: Myrlene Broker, * Primary Care Provider: Myrlene Broker, MD Reason for consultation: Subjective   Assessment & Plan  Diagnoses and all orders for this visit:  Controlled type 2 diabetes mellitus without complication, without long-term current use of insulin (HCC)  Pure hypercholesterolemia  Other orders -     tirzepatide (MOUNJARO) 12.5 MG/0.5ML Pen; Inject 12.5 mg into the skin once a week.     Diabetes Type II with no known complications  Lab Results  Component Value Date   GFR 118.55 11/18/2022   Hba1c goal less than 7, current Hba1c is  Lab Results  Component Value Date   HGBA1C 5.1 08/31/2022   Will recommend the following: Mounjaro 12.5 mg weekly, patient is  not keen on 15 mg due to concern over S/E that may happen. Discussed concerns, patient will let us know if she ever wanted to dose escalate  DexCom not covered Pt got his BG meter and supplies but reports not knowing how to check BG Instructed to come to clinic to learn how to check BG  No known contraindications to any of above medications No history of MEN syndrome/medullary thyroid cancer/pancreatitis or pancreatic cancer in self or family   -Last LD and Tg are as follows: Lab Results  Component Value Date   LDLCALC 103 (H) 06/01/2022    Lab Results  Component Value Date   TRIG 58 06/01/2022   -Follow low fat diet and exercise   -Blood pressure goal <140/90 - Microalbumin/creatinine goal < 30 -Last MA/Cr is as follows: Lab Results  Component Value Date   MICROALBUR 3.1 06/01/2022   -not on ACE/ARB -dit changes including salt restriction -limit eating outside -counseled BP targets per standards of diabetes care -uncontrolled blood pressure can lead to retinopathy, nephropathy and cardiovascular and atherosclerotic heart disease  Reviewed and counseled on: -A1C target -Blood sugar targets -Complications of uncontrolled diabetes  -Checking blood sugar before meals and bedtime and bring log next visit -All medications with mechanism of action and side effects -Hypoglycemia management: rule of 15's, Glucagon Emergency Kit and medical alert ID -low-carb low-fat plate-method diet -At least 20 minutes of physical activity per day -Annual dilated retinal eye exam and foot exam -compliance and follow up needs -follow up as scheduled or earlier if problem gets worse  Call if blood sugar is less than 70 or consistently above 250  Take a 15 gm snack of carbohydrate at bedtime before you go to sleep if your blood sugar is less than 100.    If you are going to fast after midnight for a test or procedure, ask your physician for instructions on how to reduce/decrease your  insulin dose.    Call if blood sugar is less than 70 or consistently above 250  -Treating a low sugar by rule of 15  (15 gms of sugar every 15 min until sugar is more than 70) If you feel your sugar is low, test your sugar to be sure If your sugar is low (less than 70), then take 15 grams of a fast acting Carbohydrate (3-4 glucose tablets or glucose gel or 4 ounces of juice or regular soda) Recheck your sugar 15 min after treating low to make sure it is more than 70 If sugar is still less than 70, treat again with 15 grams of carbohydrate          Don't drive the hour of hypoglycemia  If unconscious/unable to eat or drink by mouth, use glucagon injection or nasal spray baqsimi and call 911. Can repeat again in 15 min if still unconscious.  Return in about 4 months (around 04/22/2023).   I have reviewed current medications, nurse's notes, allergies, vital signs, past medical and surgical history, family medical history, and social history for this encounter. Counseled patient on symptoms, examination findings, lab findings, imaging results, treatment decisions and monitoring and prognosis. The patient understood the recommendations and agrees with the treatment plan. All questions regarding treatment plan were fully answered.  Deanna Questa, MD  12/20/22    History of Present Illness Deanna Levine is a 22 y.o. year old female who presents for evaluation of Type II diabetes mellitus.  Deanna Levine was first diagnosed at age of 22. 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  Home diabetes regimen: Mounjaro 12.5 mg weekly  COMPLICATIONS -  MI/Stroke -  retinopathy -  neuropathy -  nephropathy  BLOOD SUGAR DATA Doesn't check BG at home   Physical Exam  Wt 185 lb (83.9 kg)   BMI 31.76 kg/m    Constitutional: well developed, well nourished Head: normocephalic, atraumatic Eyes: sclera anicteric, no redness Neck:  supple Lungs: normal respiratory effort Neurology: alert and oriented Skin: dry, no appreciable rashes Musculoskeletal: no appreciable defects Psychiatric: normal mood and affect Diabetic Foot Exam - Simple   No data filed      Current Medications Patient's Medications  New Prescriptions   No medications on file  Previous Medications   ACETAMINOPHEN (TYLENOL) 160 MG/5ML ELIXIR    Take 15 mg/kg by mouth every 4 (four) hours as needed for fever.   AZELASTINE (ASTELIN) 0.1 % NASAL SPRAY    Place 2 sprays into both nostrils 2 (two) times daily. Use in each nostril as directed   BLOOD GLUCOSE MONITORING SUPPL DEVI    1 each by Does not apply route in the morning, at noon, and at bedtime. May substitute to any manufacturer covered by patient's insurance.   CETIRIZINE (ZYRTEC) 10 MG TABLET    Take 10 mg by mouth daily as needed for allergies.   CHLORHEXIDINE (HIBICLENS) 4 % EXTERNAL LIQUID    Apply 1 Application topically daily as needed.   CLINDAMYCIN-BENZOYL PEROXIDE (BENZACLIN) GEL    Apply 1 Application topically 2 (two) times daily.   CONTINUOUS GLUCOSE SENSOR (DEXCOM G7 SENSOR) MISC    1  Device by Does not apply route continuous.   DICLOFENAC (VOLTAREN) 0.1 % OPHTHALMIC SOLUTION    Place 2 drops into both eyes 4 (four) times daily.   FIBER ADULT GUMMIES 2 G CHEW    Chew 1 tablet by mouth daily.   FLUTICASONE (FLONASE) 50 MCG/ACT NASAL SPRAY    SHAKE LIQUID AND USE 2 SPRAYS IN EACH NOSTRIL DAILY   GLUCOSE BLOOD (BLOOD GLUCOSE TEST STRIPS) STRP    1 each by In Vitro route in the morning, at noon, and at bedtime. May substitute to any manufacturer covered by patient's insurance.   IBUPROFEN (ADVIL) 600 MG TABLET    Take 600 mg by mouth every 6 (six) hours as needed.   MELOXICAM (MOBIC) 15 MG TABLET    Take 1 tablet (15 mg total) by mouth daily.   MENAQUINONE-7 PO    Take 100 mcg by mouth daily.   MESALAMINE (PENTASA) 250 MG CR CAPSULE    Take 250 mg by mouth 3 (three) times daily.   NA  SULFATE-K SULFATE-MG SULF 17.5-3.13-1.6 GM/177ML SOLN    Take 177 mLs by mouth See admin instructions.   NORTRIPTYLINE (PAMELOR) 10 MG CAPSULE    Take 1 capsule (10 mg total) by mouth at bedtime.   ONDANSETRON (ZOFRAN) 4 MG/5ML SOLUTION    Take 4 mg by mouth every 8 (eight) hours as needed for nausea or vomiting.   PROPYLENE GLYCOL (SYSTANE BALANCE OP)    Apply to eye.   TIRZEPATIDE (MOUNJARO) 12.5 MG/0.5ML PEN    Inject 12.5 mg into the skin once a week.   TRIAMCINOLONE CREAM (KENALOG) 0.1 %    Apply to rough area of feet twice daily.   ZONISAMIDE (ZONEGRAN) 100 MG CAPSULE    TAKE 2 CAPSULES BY MOUTH DAILY  Modified Medications   Modified Medication Previous Medication   TIRZEPATIDE (MOUNJARO) 12.5 MG/0.5ML PEN tirzepatide (MOUNJARO) 12.5 MG/0.5ML Pen      Inject 12.5 mg into the skin once a week.    Inject 12.5 mg into the skin once a week.  Discontinued Medications   No medications on file    Allergies No Known Allergies  Past Medical History Past Medical History:  Diagnosis Date   Absence seizure disorder (HCC)    Allergy    Colitis 07/16/2021   Complication of anesthesia    slow to wake up from anesthesia, occ HA with anesthesia    Diabetes mellitus without complication (HCC)    type 2   Difficulty swallowing pills    Dyspraxia    Eczema    Global developmental delay    Obesity (BMI 30-39.9)    PONV (postoperative nausea and vomiting)    Precocious female puberty    Seizures (HCC)    last seizure 2012 - no current med   Sleep apnea    White-Sutton syndrome     Past Surgical History Past Surgical History:  Procedure Laterality Date   ADENOIDECTOMY     BALLOON DILATION N/A 11/23/2021   Procedure: BALLOON DILATION;  Surgeon: Tressia Danas, MD;  Location: [facility];  Service: Gastroenterology;  Laterality: N/A;   BIOPSY  07/16/2021   Procedure: BIOPSY;  Surgeon: Tressia Danas, MD;  [facility] ENDOSCOPY;  Service: Gastroenterology;;   BIOPSY  11/23/2021    Procedure: BIOPSY;  Surgeon: Tressia Danas, MD;  Location: [facility];  Service: Gastroenterology;;   bone removal Left 01/16/2021   foot   COLONOSCOPY WITH PROPOFOL N/A 07/16/2021   Procedure: COLONOSCOPY WITH PROPOFOL;  Surgeon:  Tressia Danas, MD;  Location: [facility] ENDOSCOPY;  Service: Gastroenterology;  Laterality: N/A;   ESOPHAGOGASTRODUODENOSCOPY (EGD) WITH PROPOFOL N/A 11/23/2021   Procedure: ESOPHAGOGASTRODUODENOSCOPY (EGD) WITH PROPOFOL;  Surgeon: Tressia Danas, MD;  Location: [facility];  Service: Gastroenterology;  Laterality: N/A;   GASTROCNEMIUS RECESSION Bilateral 04/25/2020   Procedure: BILATERAL GASTROCNEMIUS RECESSION;  Surgeon: Nadara Mustard, MD;  Location: [facility] OR;  Service: Orthopedics;  Laterality: Bilateral;   SUPPRELIN IMPLANT  05/06/2011   Procedure: SUPPRELIN IMPLANT;  Surgeon: Judie Petit. Leonia Corona, MD;  Location: [facility];  Service: Pediatrics;  Laterality: Right;   SUPPRELIN IMPLANT Right 06/14/2013   Procedure: REMOVAL OF SUPPRELIN IMPLANT FROM RIGHT UPPER ARM;  Surgeon: Judie Petit. Leonia Corona, MD;  Location: [facility];  Service: Pediatrics;  Laterality: Right;   TONSILLECTOMY     TONSILLECTOMY AND ADENOIDECTOMY     TYMPANOSTOMY TUBE PLACEMENT     x 2    Family History family history includes Anesthesia problems in her mother; Asthma in her maternal aunt; Colon cancer in an other family member; Diabetes in her maternal grandmother, mother, and paternal grandmother; Fibromyalgia in her mother; Heart disease in her maternal grandfather and paternal grandmother; Henoch-Schonlein purpura in her brother; Hyperlipidemia in her maternal grandfather, maternal grandmother, paternal grandfather, and paternal grandmother; Hypertension in her maternal grandfather, maternal grandmother, paternal grandfather, and paternal grandmother; Kidney disease in her maternal grandfather and paternal grandmother; Rheum arthritis in her mother; Seizures in her  maternal uncle; Ulcerative colitis in her mother.  Social History Social History   Socioeconomic History   Marital status: Single    Spouse name: Not on file   Number of children: 0   Years of education: 12   Highest education level: Not on file  Occupational History   Occupation: [employer]  Tobacco Use   Smoking status: Never    Passive exposure: Never   Smokeless tobacco: Never  Vaping Use   Vaping status: Never Used  Substance and Sexual Activity   Alcohol use: No    Alcohol/week: 0.0 standard drinks of alcohol   Drug use: No   Sexual activity: Never    Birth control/protection: None  Other Topics Concern   Not on file  Social History Narrative   Deanna Levine is graduated. [name] doing average.    She enjoys playing softball and basketball.   Lives with her parents.    Not working.   Left handed   Two story home      Had surgery on her legs in Feb and is physical therapy twice a week.   Left handed   No caffeine   Social Determinants of Health   Financial Resource Strain: Not on file  Food Insecurity: Not on file  Transportation Needs: Not on file  Physical Activity: Not on file  Stress: Not on file  Social Connections: Not on file  Intimate Partner Violence: Not on file    Lab Results  Component Value Date   HGBA1C 5.1 08/31/2022   HGBA1C 5.1 06/01/2022   HGBA1C 6.4 02/18/2022   Lab Results  Component Value Date   CHOL 155 06/01/2022   Lab Results  Component Value Date   HDL 38 (L) 06/01/2022   Lab Results  Component Value Date   LDLCALC 103 (H) 06/01/2022   Lab Results  Component Value Date   TRIG 58 06/01/2022   Lab Results  Component Value Date   CHOLHDL 4.1 06/01/2022   Lab Results  Component Value Date  CREATININE 0.72 11/18/2022   Lab Results  Component Value Date   GFR 118.55 11/18/2022   Lab Results  Component Value Date   MICROALBUR 3.1 06/01/2022      Component Value Date/Time   NA 139 11/18/2022 1050   NA 131 (L)  03/05/2019 1430   K 4.0 11/18/2022 1050   CL 108 11/18/2022 1050   CO2 23 11/18/2022 1050   GLUCOSE 93 11/18/2022 1050   BUN 12 11/18/2022 1050   BUN 12 03/05/2019 1430   CREATININE 0.72 11/18/2022 1050   CREATININE 0.70 06/01/2022 0929   CALCIUM 9.4 11/18/2022 1050   PROT 7.6 11/18/2022 1050   PROT 7.0 03/05/2019 1430   ALBUMIN 4.3 11/18/2022 1050   ALBUMIN 4.4 03/05/2019 1430   AST 14 11/18/2022 1050   ALT 12 11/18/2022 1050   ALKPHOS 63 11/18/2022 1050   BILITOT 0.5 11/18/2022 1050   BILITOT 0.4 03/05/2019 1430   GFRNONAA >60 04/25/2020 0646   GFRAA 139 03/05/2019 1430      Latest Ref Rng & Units 11/18/2022   10:50 AM 06/01/2022    9:29 AM 08/24/2021    2:18 PM  BMP  Glucose 70 - 99 mg/dL 93  89  161   BUN 6 - 23 mg/dL 12  14  10    Creatinine 0.40 - 1.20 mg/dL 0.96  0.45  4.09   BUN/Creat Ratio 6 - 22 (calc)  SEE NOTE:  NOT APPLICABLE   Sodium 135 - 145 mEq/L 139  141  139   Potassium 3.5 - 5.1 mEq/L 4.0  4.4  3.9   Chloride 96 - 112 mEq/L 108  111  105   CO2 19 - 32 mEq/L 23  20  23    Calcium 8.4 - 10.5 mg/dL 9.4  9.3  9.7        Component Value Date/Time   WBC 6.8 11/18/2022 1050   RBC 4.86 11/18/2022 1050   HGB 10.3 (L) 11/18/2022 1050   HGB 11.3 03/05/2019 1430   HCT 33.5 (L) 11/18/2022 1050   HCT 35.7 03/05/2019 1430   PLT 329.0 11/18/2022 1050   PLT 182 03/05/2019 1430   MCV 69.0 Repeated and verified X2. (L) 11/18/2022 1050   MCV 67 (L) 03/05/2019 1430   MCH 21.3 (L) 06/01/2022 0929   MCHC 30.6 11/18/2022 1050   RDW 16.4 (H) 11/18/2022 1050   RDW 17.9 (H) 03/05/2019 1430   LYMPHSABS 2,365 02/12/2021 1104   MONOABS 0.5 07/05/2012 1610   EOSABS 26 02/12/2021 1104   BASOSABS 26 02/12/2021 1104     Parts of this note may have been dictated using voice recognition software. There may be variances in spelling and vocabulary which are unintentional. Not all errors are proofread. Please notify the Thereasa Parkin if any discrepancies are noted or if the meaning of  any statement is not clear.

## 2022-12-22 NOTE — Telephone Encounter (Signed)
Patient was referring to [facility] paperwork I have mailed them out today

## 2022-12-23 ENCOUNTER — Ambulatory Visit: Payer: 59 | Admitting: Behavioral Health

## 2022-12-23 DIAGNOSIS — F419 Anxiety disorder, unspecified: Secondary | ICD-10-CM

## 2022-12-23 DIAGNOSIS — R4589 Other symptoms and signs involving emotional state: Secondary | ICD-10-CM

## 2022-12-23 LAB — HM DIABETES EYE EXAM

## 2022-12-23 NOTE — Progress Notes (Signed)
                Anastasya Jewell L Farryn Linares, LMFT 

## 2022-12-23 NOTE — Progress Notes (Signed)
[facility] Counselor/Therapist Progress Note  Patient ID: Deanna Levine, MRN: [id number],    Date: 12/23/2022  Time Spent: 55 min Caregility video; Pt is home in private & Provider is working remotely from [employer]. Pt is aware of of the risks/limitations of telehealth & consents to Tx today.  Time In: 1:00pm Time Out:1:55pm  Treatment Type: Individual Therapy  Reported Symptoms: Family has been tgthr for [employer] last wknd. They were busy all wknd w/activities. Parents were busy feeding the [employer] out of their truck.   Mental Status Exam: Appearance:  Casual and Neat     Behavior: Appropriate and Sharing  Motor: Normal  Speech/Language:  Clear and Coherent  Affect: Appropriate  Mood: normal  Thought process: normal  Thought content:   WNL  Sensory/Perceptual disturbances:   WNL  Orientation: oriented to person, place, time/date, and situation  Attention: Good  Concentration: Good  Memory: WNL  Fund of knowledge:  Good  Insight:   Fair  Judgment:  Good  Impulse Control: Good   Risk Assessment: Danger to Self:  No Self-injurious Behavior: No Danger to Others: No Duty to Warn:no Physical Aggression / Violence:No  Access to Firearms a concern: No  Gang Involvement:No   Subjective: Pt is doing her Last Will & Testament soon. They are completing her POA today. Her R foot surgery is Friday. She expects to handle her [employer] Appl on Monday so she can get be off her Dad's Ins. She already has EFT, now [place], & her Disability.   After surgery she will be on a knee scooter for 6-8 wks post surgery w/no pressure on her foot for 6-8 wks. She has a Electrical engineer for her surgery. Dad will be caring for her 2 wks. Bros will also be in the home after wisdom teeth extraction.   [employer] took her to [employer] last Tue. They had a very good time talking politics.   Interventions:  Interpersonal  Diagnosis:Anxiety  Anxiety about health  Plan: Dillynn will take things in stride this Friday & next week as Family deals w/health issues for both Adult Children. We will touch base after she is able post surgical.   Target Date: 01/29/2023  Progress: 7  Frequency: Once every 2-3 wks  Modality: Claretta Fraise, LMFT

## 2022-12-24 ENCOUNTER — Other Ambulatory Visit: Payer: Self-pay | Admitting: Podiatry

## 2022-12-24 DIAGNOSIS — Q6689 Other  specified congenital deformities of feet: Secondary | ICD-10-CM

## 2022-12-24 MED ORDER — OXYCODONE HCL 5 MG/5ML PO SOLN: 5.0000 mg | Freq: Four times a day (QID) | ORAL | 0 refills | Status: AC | PRN

## 2022-12-24 MED ORDER — ASPIRIN 81 MG PO CHEW: 81.0000 mg | CHEWABLE_TABLET | Freq: Two times a day (BID) | ORAL | 0 refills | Status: DC

## 2022-12-24 MED ORDER — ONDANSETRON 4 MG PO TBDP: 4.0000 mg | ORAL_TABLET | Freq: Three times a day (TID) | ORAL | 0 refills | Status: DC | PRN

## 2022-12-24 MED ORDER — ACETAMINOPHEN 160 MG/5ML PO SUSP: 320.0000 mg | Freq: Four times a day (QID) | ORAL | 0 refills | Status: AC | PRN

## 2022-12-24 MED ORDER — GABAPENTIN 250 MG/5ML PO SOLN: 300.0000 mg | Freq: Three times a day (TID) | ORAL | 0 refills | Status: DC

## 2022-12-30 ENCOUNTER — Encounter: Payer: 59 | Admitting: Podiatry

## 2023-01-04 ENCOUNTER — Encounter: Payer: Self-pay | Admitting: Podiatry

## 2023-01-04 ENCOUNTER — Other Ambulatory Visit: Payer: Self-pay | Admitting: Podiatry

## 2023-01-04 ENCOUNTER — Ambulatory Visit (INDEPENDENT_AMBULATORY_CARE_PROVIDER_SITE_OTHER): Payer: 59 | Admitting: Podiatry

## 2023-01-04 ENCOUNTER — Ambulatory Visit (INDEPENDENT_AMBULATORY_CARE_PROVIDER_SITE_OTHER): Payer: 59

## 2023-01-04 DIAGNOSIS — Q6689 Other  specified congenital deformities of feet: Secondary | ICD-10-CM

## 2023-01-04 DIAGNOSIS — M778 Other enthesopathies, not elsewhere classified: Secondary | ICD-10-CM

## 2023-01-07 NOTE — Progress Notes (Signed)
  Subjective:  Patient ID: Deanna Levine, female    DOB: [birth year],  MRN: [id number]  Chief Complaint  Patient presents with   Routine Post Op    PATIENT STATES SHE HAS BEEN DOING GOOD , SOME PAIN , PATIENT STATES THAT HER LEG KEEPS SWEATING .     DOS: 12/24/2022 Procedure: Middle facet coalition resection  22 y.o. female returns for post-op check.   Review of Systems: Negative except as noted in the HPI. Denies N/V/F/Ch.   Objective:  There were no vitals filed for this visit. There is no height or weight on file to calculate BMI. Constitutional Well developed. Well nourished.  Vascular Foot warm and well perfused. Capillary refill normal to all digits.  Calf is soft and supple, no posterior calf or knee pain, negative Homans' sign  Neurologic Normal speech. Oriented to person, place, and time. Epicritic sensation to light touch grossly present bilaterally.  Dermatologic Skin healing well without signs of infection. Skin edges well coapted without signs of infection.  Orthopedic: Tenderness to palpation noted about the surgical site.   Multiple view plain film radiographs: Resection of the coalition noted Assessment:   1. Coalition, calcaneal tarsal    Plan:  Patient was evaluated and treated and all questions answered.  S/p foot surgery right -Progressing as expected post-operatively.  Splint removed today and placed into CAM boot.  May begin gradual weightbearing and range of motion.  Return in 1 week for suture removal   Return in about 1 week (around 01/11/2023) for suture removal, post op (no x-rays).

## 2023-01-11 ENCOUNTER — Encounter: Payer: Self-pay | Admitting: Podiatry

## 2023-01-11 ENCOUNTER — Ambulatory Visit (INDEPENDENT_AMBULATORY_CARE_PROVIDER_SITE_OTHER): Payer: 59 | Admitting: Podiatry

## 2023-01-11 DIAGNOSIS — Q6689 Other  specified congenital deformities of feet: Secondary | ICD-10-CM

## 2023-01-11 NOTE — Progress Notes (Signed)
  Subjective:  Patient ID: Deanna Levine, female    DOB: 2000-04-08,  MRN: [id number]  Chief Complaint  Patient presents with   Routine Post Op    POV # 2 DOS 12/24/2022 --- REMOVAL OF COALITION, POSSIBLE FUSION OF SUBTALAR JOINT "It's doing okay, it hurts."  Mom stated, "It's been difficult getting her to walk on it."    DOS: 12/24/2022 Procedure: Middle facet coalition resection  22 y.o. female returns for post-op check.   Review of Systems: Negative except as noted in the HPI. Denies N/V/F/Ch.   Objective:  There were no vitals filed for this visit. There is no height or weight on file to calculate BMI. Constitutional Well developed. Well nourished.  Vascular Foot warm and well perfused. Capillary refill normal to all digits.  Calf is soft and supple, no posterior calf or knee pain, negative Homans' sign  Neurologic Normal speech. Oriented to person, place, and time. Epicritic sensation to light touch grossly present bilaterally.  Dermatologic Incision well healed  Orthopedic: Tenderness to palpation noted about the surgical site.   Multiple view plain film radiographs: Resection of the coalition noted Assessment:   1. Coalition, calcaneal tarsal    Plan:  Patient was evaluated and treated and all questions answered.  S/p foot surgery right -Start PT immediately. Referral sent. Advised her she needs to begin WB and start ROM exercises for the ankle and subtalar joint ASAP to prevent arthrosis. Follow up in 1 month. She may begin to transition out of the boot for WB at this point  Return in about 5 weeks (around 02/15/2023) for post op (new x-rays).

## 2023-01-13 ENCOUNTER — Encounter: Payer: 59 | Admitting: Podiatry

## 2023-01-13 ENCOUNTER — Encounter: Payer: Self-pay | Admitting: Neurology

## 2023-01-13 ENCOUNTER — Ambulatory Visit (INDEPENDENT_AMBULATORY_CARE_PROVIDER_SITE_OTHER): Payer: 59 | Admitting: Neurology

## 2023-01-13 ENCOUNTER — Ambulatory Visit: Payer: Medicaid Other | Admitting: Neurology

## 2023-01-13 ENCOUNTER — Encounter: Payer: Self-pay | Admitting: Podiatry

## 2023-01-13 VITALS — BP 126/72 | HR 113 | Ht 64.0 in | Wt 188.6 lb

## 2023-01-13 DIAGNOSIS — G43009 Migraine without aura, not intractable, without status migrainosus: Secondary | ICD-10-CM

## 2023-01-13 DIAGNOSIS — G40409 Other generalized epilepsy and epileptic syndromes, not intractable, without status epilepticus: Secondary | ICD-10-CM | POA: Diagnosis not present

## 2023-01-13 MED ORDER — ZONISAMIDE 100 MG PO CAPS: ORAL_CAPSULE | ORAL | 3 refills | Status: DC

## 2023-01-13 NOTE — Progress Notes (Signed)
NEUROLOGY FOLLOW UP OFFICE NOTE  Deanna Levine [id number] 23-Dec-2000  HISTORY OF PRESENT ILLNESS: I had the pleasure of seeing Deanna Levine in follow-up in the neurology clinic on 01/13/2023.  The patient was last seen 6 months ago for primary generalized epilepsy and migraines. [name] again accompanied by her father who helps supplement the history today.  Records and images were personally reviewed where available.  On her last visit, she reported improvement in headaches however contacted our office in 09/2022 about recurrence of daily headaches lasting all day. She was taking Tylenol once a day. She was instructed to minimize rescue medication to 3 times a week, and also start nortriptyline 10mg  at bedtime. She reports that since then, the headaches have resolved, she is not having any headaches and had stopped the nortriptyline. She denied any side effects on medication. She continues on Zonisamide 200mg  daily for seizure prophylaxis. Her father has not noticed any staring spells. She denies any seizures or seizure-like symptoms. She denies any olfactory/gustatory hallucinations, focal numbness/tingling/weakness (except for right foot s/p surgery), myoclonic jerks. No dizziness, vision changes, no falls. Sleep and mood are good. Her father reports that they usually have to repeat themselves, she does not remember things or says she does not understand what they are saying. She usually manages her medications and denies missing doses, but since her surgery, parents have been helping.    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 She  was seen by Dr. Allena Katz in August 2021 for concern for seizures. She was previously followed by pediatric neurologist Dr. Sharene Skeans, notes were reviewed. Seizures began at age 56 with multiple absence seizures daily where she would drop her hand to her side and begin drooling, unresponsive for around 2 minutes followed by sleepiness. No convulsive seizures. At that time, MRI brain and initial EEG were normal. She was treated with carbamazepine with no further seizures after 2010, however repeat EEG in 2012 reported diphasic sharply contoured slow wave activity seen bilaterally synchronously in the central, temporal, and parietal region so she was continued on medication. She had another EEG in 2015 which was normal and she was tapered off carbamazepine. Of note, on her 2017 clinic visit, her mother reported that she stares sometimes but it seems to be daydreaming or not paying attention rather than seizure activity because she would respond when called. She presented to Dr. Allena Katz in 2021 with her mother reporting very brief staring spells lasting a few seconds, different from her typical seizures because they were much shorter and would not recur. They appeared as if she was daydreaming or not paying attention, worse with stress. [name] accompanied today by her father who has not seen the staring episodes but states that she does daydream and sometimes would not respond, saying she does not hear them calling her. She states she feels good. She denies any gaps in time, olfactory/gustatory hallucinations, focal numbness/tingling, myoclonic jerks. No headaches, dizziness, vision changes, no falls. She has been having orthopedic difficulties in her feet and hands and was ultimately diagnosed with WSS a few months ago. She was previously working but has been taken out of work because she is unable to  stand for prolonged periods and it affects her muscle skills. She manages her own medications, her father reports she is  pretty good with this. Sleep is good, she sleeps 9-10 hours. She states her mood is okay, her father notes there has been some stress recently.   Epilepsy Risk Factors:  White-Sutton Syndrome. She had global developmental delay since infancy. There is no history of febrile convulsions, CNS infections such as meningitis/encephalitis, significant traumatic brain injury, neurosurgical procedures, or family history of seizures.  Prior AEDs: carbamazepine, topiramate (nausea)  Laboratory Data: EEGs: EEG 2012 reported diphasic sharply contoured slow wave activity seen bilaterally synchronously in the central, temporal, and parietal region EEG normal 2015 and 2018 48-hour EEG 04/2021 abnormal with rare generalized 4 Hz spike and wave discharges with frontal predominance seen in sleep.  MRI brain in 2003 unremarkable.   PAST MEDICAL HISTORY: Past Medical History:  Diagnosis Date   Absence seizure disorder (HCC)    Allergy    Colitis 07/16/2021   Complication of anesthesia    slow to wake up from anesthesia, occ HA with anesthesia    Diabetes mellitus without complication (HCC)    type 2   Difficulty swallowing pills    Dyspraxia    Eczema    Global developmental delay    Obesity (BMI 30-39.9)    PONV (postoperative nausea and vomiting)    Precocious female puberty    Seizures (HCC)    last seizure 2012 - no current med   Sleep apnea    White-Sutton syndrome     MEDICATIONS: Current Outpatient Medications on File Prior to Visit  Medication Sig Dispense Refill   azelastine (ASTELIN) 0.1 % nasal spray Place 2 sprays into both nostrils 2 (two) times daily. Use in each nostril as directed     cetirizine (ZYRTEC) 10 MG tablet Take 10 mg by mouth daily as needed for allergies.     chlorhexidine (HIBICLENS) 4 % external liquid Apply 1 Application topically daily as needed.     clindamycin-benzoyl peroxide (BENZACLIN) gel Apply 1 Application topically 2 (two) times daily.     Continuous  Glucose Sensor (DEXCOM G7 SENSOR) MISC 1 Device by Does not apply route continuous. 9 each 0   Fiber Adult Gummies 2 g CHEW Chew 1 tablet by mouth daily.     fluticasone (FLONASE) 50 MCG/ACT nasal spray SHAKE LIQUID AND USE 2 SPRAYS IN EACH NOSTRIL DAILY 48 g 1   Glucose Blood (BLOOD GLUCOSE TEST STRIPS) STRP 1 each by In Vitro route in the morning, at noon, and at bedtime. May substitute to any manufacturer covered by patient's insurance. 100 each 3   ibuprofen (ADVIL) 600 MG tablet Take 600 mg by mouth every 6 (six) hours as needed.     meloxicam (MOBIC) 15 MG tablet Take 1 tablet (15 mg total) by mouth daily. 30 tablet 3   MENAQUINONE-7 PO Take 100 mcg by mouth daily.     Na Sulfate-K Sulfate-Mg Sulf 17.5-3.13-1.6 GM/177ML SOLN Take 177 mLs by mouth See admin instructions.     ondansetron (ZOFRAN-ODT) 4 MG disintegrating tablet Take 1 tablet (4 mg total) by mouth every 8 (eight) hours as needed for nausea or vomiting. 20 tablet 0   Propylene Glycol (SYSTANE BALANCE OP) Apply to eye.     tirzepatide (MOUNJARO) 12.5 MG/0.5ML Pen Inject 12.5 mg into the skin once a week. 2 mL 2   triamcinolone cream (KENALOG) 0.1 % Apply to rough area of feet twice daily. 454 g  0   zonisamide (ZONEGRAN) 100 MG capsule TAKE 2 CAPSULES BY MOUTH DAILY 180 capsule 0   Blood Glucose Monitoring Suppl DEVI 1 each by Does not apply route in the morning, at noon, and at bedtime. May substitute to any manufacturer covered by patient's insurance. (Patient not taking: Reported on 12/20/2022) 1 each 0   nortriptyline (PAMELOR) 10 MG capsule Take 1 capsule (10 mg total) by mouth at bedtime. (Patient not taking: Reported on 01/13/2023) 90 capsule 1   tirzepatide (MOUNJARO) 12.5 MG/0.5ML Pen Inject 12.5 mg into the skin once a week. 6 mL 1   No current facility-administered medications on file prior to visit.    ALLERGIES: No Known Allergies  FAMILY HISTORY: Family History  Problem Relation Age of Onset   Diabetes Mother         steroid induced; hx. colitis   Anesthesia problems Mother        severe headache lasting 2-3 days   Rheum arthritis Mother    Ulcerative colitis Mother    Fibromyalgia Mother    Henoch-Schonlein purpura Brother        in remission   Hypertension Maternal Grandmother    Hyperlipidemia Maternal Grandmother    Diabetes Maternal Grandmother    Kidney disease Maternal Grandfather    Hypertension Maternal Grandfather    Heart disease Maternal Grandfather    Hyperlipidemia Maternal Grandfather    Kidney disease Paternal Grandmother    Hypertension Paternal Grandmother    Diabetes Paternal Grandmother    Heart disease Paternal Grandmother    Hyperlipidemia Paternal Grandmother    Hypertension Paternal Grandfather    Hyperlipidemia Paternal Grandfather    Asthma Maternal Aunt    Seizures Maternal Uncle    Colon cancer Other        great aunt   Esophageal cancer Neg Hx    Esophageal varices Neg Hx    Pancreatic disease Neg Hx     SOCIAL HISTORY: Social History   Socioeconomic History   Marital status: Single    Spouse name: Not on file   Number of children: 0   Years of education: 12   Highest education level: Not on file  Occupational History   Occupation: [employer]  Tobacco Use   Smoking status: Never    Passive exposure: Never   Smokeless tobacco: Never  Vaping Use   Vaping status: Never Used  Substance and Sexual Activity   Alcohol use: No    Alcohol/week: 0.0 standard drinks of alcohol   Drug use: No   Sexual activity: Never    Birth control/protection: None  Other Topics Concern   Not on file  Social History Narrative   [place] is graduated. [name] doing average.    She enjoys playing softball and basketball.   Lives with her parents.    Not working.   Left handed   Two story home      Had surgery on her legs in Feb and is physical therapy twice a week.   Left handed   No caffeine   Social Determinants of Health   Financial Resource Strain:  Not on file  Food Insecurity: Not on file  Transportation Needs: Not on file  Physical Activity: Not on file  Stress: Not on file  Social Connections: Not on file  Intimate Partner Violence: Not on file     PHYSICAL EXAM: Vitals:   01/13/23 0826  BP: 126/72  Pulse: (!) 113  SpO2: 99%   General: No acute  distress, flat affect Head:  Normocephalic/atraumatic Skin/Extremities: No rash, no edema, right foot in boot Neurological Exam: alert and awake. No aphasia or dysarthria. Fund of knowledge is appropriate. Attention and concentration are normal.   Cranial nerves: Pupils equal, round. Extraocular movements intact with no nystagmus. Visual fields full.  No facial asymmetry.  Motor: Bulk and tone normal, muscle strength 5/5 on both UE, left LE. Right foot elevated on scooter, in boot. Finger to nose testing intact.  Gait slow and cautious with scooter.   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 Her 24-hour EEG showed rare generalized 4 Hz spike and wave discharges with frontal predominance in sleep. MRI brain no acute changes, there are a few white matter signal changes likely chronic microvascular disease. No seizures since last visit. She denies any headaches and stopped nortriptyline. If headaches recur, she knows to call our office and we can restart nortriptyline. Continue close supervision. She does not drive. Follow-up in 6 months, call for any changes.   Thank you for allowing me to participate in her care.  Please do not hesitate to call for any questions or concerns.    Patrcia Dolly, M.D.   CC: Dr. Okey Dupre

## 2023-01-13 NOTE — Patient Instructions (Signed)
Good to see you doing overall well. Continue Zonisamide 200mg  daily. If headaches recur, call our office and we will restart Nortriptyline. Follow-up in 6 months, call for any changes.    Seizure Precautions: 1. If medication has been prescribed for you to prevent seizures, take it exactly as directed.  Do not stop taking the medicine without talking to your doctor first, even if you have not had a seizure in a long time.   2. Avoid activities in which a seizure would cause danger to yourself or to others.  Don't operate dangerous machinery, swim alone, or climb in high or dangerous places, such as on ladders, roofs, or girders.  Do not drive unless your doctor says you may.  3. If you have any warning that you may have a seizure, lay down in a safe place where you can't hurt yourself.    4.  No driving for 6 months from last seizure, as per [facility].   Please refer to the following link on the [employer]ile[employer] of [employer]'s website for more information: http://www.epilepsyfoundation.org/answerplace/Social/driving/drivingu.cfm   5.  Maintain good sleep hygiene.  6.  Notify your neurology if you are planning pregnancy or if you become pregnant.  7.  Contact your doctor if you have any problems that may be related to the medicine you are taking.  8.  Call 911 and bring the patient back to the ED if:        A.  The seizure lasts longer than 5 minutes.       B.  The patient doesn't awaken shortly after the seizure  C.  The patient has new problems such as difficulty seeing, speaking or moving  D.  The patient was injured during the seizure  E.  The patient has a temperature over 102 F (39C)  F.  The patient vomited and now is having trouble breathing

## 2023-01-17 ENCOUNTER — Other Ambulatory Visit (HOSPITAL_COMMUNITY): Payer: Self-pay

## 2023-01-17 ENCOUNTER — Ambulatory Visit: Payer: 59 | Admitting: Behavioral Health

## 2023-01-20 ENCOUNTER — Ambulatory Visit: Payer: 59 | Admitting: Behavioral Health

## 2023-01-20 DIAGNOSIS — R4589 Other symptoms and signs involving emotional state: Secondary | ICD-10-CM | POA: Diagnosis not present

## 2023-01-20 DIAGNOSIS — F419 Anxiety disorder, unspecified: Secondary | ICD-10-CM | POA: Diagnosis not present

## 2023-01-20 NOTE — Therapy (Deleted)
OUTPATIENT PHYSICAL THERAPY LOWER EXTREMITY EVALUATION   Patient Name: Deanna Levine MRN: [id number] DOB:February 24, 2001, 22 y.o., female Today's Date: 01/20/2023  END OF SESSION:   Past Medical History:  Diagnosis Date   Absence seizure disorder (HCC)    Allergy    Colitis 07/16/2021   Complication of anesthesia    slow to wake up from anesthesia, occ HA with anesthesia    Diabetes mellitus without complication (HCC)    type 2   Difficulty swallowing pills    Dyspraxia    Eczema    Global developmental delay    Obesity (BMI 30-39.9)    PONV (postoperative nausea and vomiting)    Precocious female puberty    Seizures (HCC)    last seizure 2012 - no current med   Sleep apnea    White-Sutton syndrome    Past Surgical History:  Procedure Laterality Date   ADENOIDECTOMY     BALLOON DILATION N/A 11/23/2021   Procedure: BALLOON DILATION;  Surgeon: Tressia Danas, MD;  Location: [facility];  Service: Gastroenterology;  Laterality: N/A;   BIOPSY  07/16/2021   Procedure: BIOPSY;  Surgeon: Tressia Danas, MD;  Location: [facility] END[facility]COPY;  Service: Gastroenterology;;   BIOPSY  11/23/2021   Procedure: BIOPSY;  Surgeon: Tressia Danas, MD;  Location: [facility];  Service: Gastroenterology;;   bone removal Left 01/16/2021   foot   COLONOSCOPY WITH PROPOFOL N/A 07/16/2021   Procedure: COLONOSCOPY WITH PROPOFOL;  Surgeon: Tressia Danas, MD;  Location: [facility]COPY;  Service: Gastroenterology;  Laterality: N/A;   ESOPHAGOGASTRODUODENOSCOPY (EGD) WITH PROPOFOL N/A 11/23/2021   Procedure: ESOPHAGOGASTRODUODENOSCOPY (EGD) WITH PROPOFOL;  Surgeon: Tressia Danas, MD;  Location: [facility];  Service: Gastroenterology;  Laterality: N/A;   FOOT SURGERY Right 11/2022   GASTROCNEMIUS RECESSION Bilateral 04/25/2020   Procedure: BILATERAL GASTROCNEMIUS RECESSION;  Surgeon: Nadara Mustard, MD;  Location: [facility] OR;  Service: Orthopedics;  Laterality: Bilateral;   SUPPRELIN IMPLANT   05/06/2011   Procedure: SUPPRELIN IMPLANT;  Surgeon: Judie Petit. Leonia Corona, MD;  Location: [facility];  Service: Pediatrics;  Laterality: Right;   SUPPRELIN IMPLANT Right 06/14/2013   Procedure: REMOVAL OF SUPPRELIN IMPLANT FROM RIGHT UPPER ARM;  Surgeon: Judie Petit. Leonia Corona, MD;  Location: [facility];  Service: Pediatrics;  Laterality: Right;   TONSILLECTOMY     TONSILLECTOMY AND ADENOIDECTOMY     TYMPANOSTOMY TUBE PLACEMENT     x 2   Patient Active Problem List   Diagnosis Date Noted   Hyperhidrosis 12/30/2021   Dysphagia    OSA (obstructive sleep apnea) 09/30/2021   Bilateral hand numbness 09/15/2021   Acne vulgaris 08/26/2021   Noninfectious gastroenteritis    Bilateral arm pain 06/12/2021   DUB (dysfunctional uterine bleeding) 05/14/2021   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   Other allergic rhinitis 08/20/2020   Allergic conjunctivitis of both eyes 08/20/2020   Encounter for general adult medical examination with abnormal findings 07/03/2020   Leg length discrepancy 06/05/2020   Hidradenitis suppurativa 06/05/2020   Achilles tendon contracture, bilateral    Elevated sedimentation rate 02/07/2020   Pain in right ankle and joints of right foot 02/07/2020   Anemia 12/29/2018   Type 2 diabetes mellitus (HCC) 08/15/2017   Elevated triglycerides with high cholesterol 12/09/2016   Mild intellectual disability 08/16/2012   Laxity of ligament 08/16/2012   Delayed milestones 08/16/2012   Obesity 06/07/2011   Global developmental delay    Petit mal without grand mal seizures (HCC)    Dyspraxia  PCP: Myrlene Broker, MD  REFERRING PROVIDER: Edwin Cap, DPM  REFERRING DIAG: [phone] (ICD-10-CM) - Coalition, calcaneal tarsal  THERAPY DIAG:  No diagnosis found.  Rationale for Evaluation and Treatment: Rehabilitation  ONSET DATE: ***  SUBJECTIVE:   SUBJECTIVE STATEMENT: *** Patient is s/p middle facet coalition resection on right. No  restrictions for PT she may be weightbearing as tolerated focus on range of motion of subtalar and ankle joint gait training ambulation strengthening and conditioning.  Modalities as tolerated.  Evaluate and treat.   Middle facet coalition resection   PERTINENT HISTORY: *** PAIN:  Are you having pain? Yes: NPRS scale: ***/10 Pain location: *** Pain description: *** Aggravating factors: *** Relieving factors: ***  PRECAUTIONS: {Therapy precautions:24002}  RED FLAGS: {PT Red Flags:29287}   WEIGHT BEARING RESTRICTIONS: {Yes ***/No:24003}  FALLS:  Has patient fallen in last 6 months? {fallsyesno:27318}  LIVING ENVIRONMENT: Lives with: {[facility] lives with:25569::"lives with their family"} Lives in: {Lives in:25570} Stairs: {opstairs:27293} Has following equipment at home: {Assistive devices:23999}  OCCUPATION: ***  PLOF: {PLOF:24004}  PATIENT GOALS: ***  NEXT MD VISIT: ***  OBJECTIVE:  Note: Objective measures were completed at Evaluation unless otherwise noted.  DIAGNOSTIC FINDINGS: ***  PATIENT SURVEYS:  {rehab surveys:24030}  COGNITION: Overall cognitive status: {cognition:24006}     SENSATION: {sensation:27233}  EDEMA:  {edema:24020}  MUSCLE LENGTH: Hamstrings: Right *** deg; Left *** deg Maisie Fus test: Right *** deg; Left *** deg  POSTURE: {posture:25561}  PALPATION: ***    LE Measurements Lower Extremity Right EVAL Left EVAL   A/PROM MMT A/PROM MMT  Hip Flexion      Hip Extension      Hip Abduction      Hip Adduction      Hip Internal rotation      Hip External rotation      Knee Flexion      Knee Extension      Ankle Dorsiflexion      Ankle Plantarflexion      Ankle Inversion      Ankle Eversion       (Blank rows = not tested) * pain   LOWER EXTREMITY SPECIAL TESTS:  {LEspecialtests:26242}  FUNCTIONAL TESTS:  {Functional tests:24029}  GAIT: Distance walked: *** Assistive device utilized: {Assistive devices:23999} Level of  assistance: {Levels of assistance:24026} Comments: ***   TODAY'S TREATMENT:                                                                                                                              DATE: ***  01/20/2023  Therapeutic Exercise:  Aerobic: Supine: Prone:  Seated:  Standing: Neuromuscular Re-education: Manual Therapy: Therapeutic Activity: Self Care: Trigger Point Dry Needling:  Modalities:    PATIENT EDUCATION:  Education details: on current presentation, on HEP, on clinical outcomes score and POC Person educated: Patient Education method: Explanation, Demonstration, and Handouts Education comprehension: verbalized understanding   HOME EXERCISE PROGRAM: ***  ASSESSMENT:  CLINICAL IMPRESSION: Patient  is a *** y.o. *** who was seen today for physical therapy evaluation and treatment for ***.   OBJECTIVE IMPAIRMENTS: {opptimpairments:25111}.   ACTIVITY LIMITATIONS: {activitylimitations:27494}  PARTICIPATION LIMITATIONS: {participationrestrictions:25113}  PERSONAL FACTORS: {Personal factors:25162} are also affecting patient's functional outcome.   REHAB POTENTIAL: {rehabpotential:25112}  CLINICAL DECISION MAKING: {clinical decision making:25114}  EVALUATION COMPLEXITY: {Evaluation complexity:25115}   GOALS: Goals reviewed with patient? yes  SHORT TERM GOALS: Target date: {follow up:25551} *** MAKE TEXT EDITABLE Patient will be independent in self management strategies to improve quality of life and functional outcomes. Baseline: New Program Goal status: INITIAL  2.  Patient will report at least 50% improvement in overall symptoms and/or function to demonstrate improved functional mobility Baseline: 0% better Goal status: INITIAL  3.  *** Baseline:  Goal status: INITIAL  4.  *** Baseline:  Goal status: INITIAL    LONG TERM GOALS: Target date: {follow up:25551} *** MAKE TEXT EDITABLE  Patient will report at least 75% improvement in  overall symptoms and/or function to demonstrate improved functional mobility Baseline: 0% better Goal status: INITIAL  2.  Patient will improve score on FOTO outcomes measure to projected score to demonstrate overall improved function and QOL Baseline: see above Goal status: INITIAL  3.  *** Baseline:  Goal status: INITIAL  4.  *** Baseline:  Goal status: INITIAL    PLAN:  PT FREQUENCY: {rehab frequency:25116}  PT DURATION: {rehab duration:25117}  PLANNED INTERVENTIONS: 97110-Therapeutic exercises, 97530- Therapeutic activity, 97112- Neuromuscular re-education, 97535- Self Care, 28413- Manual therapy, [id number]- Gait training, [phone]- Orthotic Fit/training, [phone]- Canalith repositioning, [id number]- Aquatic Therapy, 97014- Electrical stimulation (unattended), [phone]- Ionotophoresis 4mg /ml Dexamethasone, Patient/Family education, Balance training, Stair training, Taping, Dry Needling, Joint mobilization, Joint manipulation, Spinal manipulation, Spinal mobilization, Cryotherapy, and Moist heat   PLAN FOR NEXT SESSION: ***   4:03 PM, 01/20/23 Tereasa Coop, DPT Physical Therapy with Dolores Lory

## 2023-01-20 NOTE — Progress Notes (Signed)
[facility] Counselor/Therapist Progress Note  Patient ID: Deanna Levine, MRN: [id number],    Date: 01/20/2023  Time Spent: 30 min Caregility video; Pt is in Home Office in private & Provider is working remotely from [employer]. Pt is aware, along w/her Parents, of the risks/limitations of telehealth & consents to Tx today.  Time In: 1:00pm Time Out: 1:30pm   Treatment Type: Individual Therapy  Reported Symptoms: Reduction in anx/dep & stress since the surgery on her R foot to remove extra bone.   Mental Status Exam: Appearance:  Casual     Behavior: Appropriate and Sharing  Motor: Normal  Speech/Language:  Clear and Coherent  Affect: Appropriate  Mood: normal  Thought process: normal  Thought content:   WNL  Sensory/Perceptual disturbances:   WNL  Orientation: oriented to person, place, time/date, and situation  Attention: Good  Concentration: Good  Memory: WNL  Fund of knowledge:  Good  Insight:   Good  Judgment:  Good  Impulse Control: Good   Risk Assessment: Danger to Self:  No Self-injurious Behavior: No Danger to Others: No Duty to Warn:no Physical Aggression / Violence:No  Access to Firearms a concern: No  Gang Involvement:No   Subjective: Pt is newly off recovery from her R foot surgery in late Oct. Her Dad, Mom & her [employer] both helped in the recovery process. She has exp'd boredom since she was confined to the home. Since her 3rd wk of recovery she has been up & out of the home environment. She likes being active & doing her life much better now.  She will begin to try using a shoe this upcoming Mon w/her PT.  Interventions: Family Systems  Diagnosis:Anxiety  Anxiety about health  Plan: Deanna Levine will cont to help her Parents over the Holidays. [name] constricted to the home envir for now, but is looking forward to the rest of the Holidays. She will see the last [place] [place] [employer] game of the season w/her Dad this wknd.   Target Date:  02/28/2023 Progress: 5 Frequency: Once every 2-3 wks  Modality: Claretta Fraise, LMFT

## 2023-01-20 NOTE — Progress Notes (Signed)
                Deanna Levine L Farryn Linares, LMFT 

## 2023-01-21 ENCOUNTER — Other Ambulatory Visit: Payer: Self-pay | Admitting: "Endocrinology

## 2023-01-24 ENCOUNTER — Ambulatory Visit: Payer: 59 | Admitting: Physical Therapy

## 2023-01-25 ENCOUNTER — Other Ambulatory Visit: Payer: Self-pay

## 2023-01-25 ENCOUNTER — Other Ambulatory Visit: Payer: Self-pay | Admitting: Podiatry

## 2023-01-25 ENCOUNTER — Ambulatory Visit: Payer: 59 | Attending: Internal Medicine

## 2023-01-25 DIAGNOSIS — R2689 Other abnormalities of gait and mobility: Secondary | ICD-10-CM | POA: Insufficient documentation

## 2023-01-25 DIAGNOSIS — M25571 Pain in right ankle and joints of right foot: Secondary | ICD-10-CM | POA: Insufficient documentation

## 2023-01-25 DIAGNOSIS — Q6689 Other  specified congenital deformities of feet: Secondary | ICD-10-CM | POA: Insufficient documentation

## 2023-01-25 DIAGNOSIS — M6281 Muscle weakness (generalized): Secondary | ICD-10-CM | POA: Diagnosis not present

## 2023-01-25 NOTE — Therapy (Signed)
OUTPATIENT PHYSICAL THERAPY LOWER EXTREMITY EVALUATION   Patient Name: Deanna Levine MRN: [id number] DOB:09/07/2000, 22 y.o., female Today's Date: 01/25/2023  END OF SESSION:  PT End of Session - 01/25/23 1531     Visit Number 1    Number of Visits 24    Date for PT Re-Evaluation 04/19/23    Authorization Type [employer] Dual Complete    PT Start Time 1445    PT Stop Time 1525    PT Time Calculation (min) 40 min    Activity Tolerance Patient tolerated treatment well    Behavior During Therapy WFL for tasks assessed/performed             Past Medical History:  Diagnosis Date   Absence seizure disorder (HCC)    Allergy    Colitis 07/16/2021   Complication of anesthesia    slow to wake up from anesthesia, occ HA with anesthesia    Diabetes mellitus without complication (HCC)    type 2   Difficulty swallowing pills    Dyspraxia    Eczema    Global developmental delay    Obesity (BMI 30-39.9)    PONV (postoperative nausea and vomiting)    Precocious female puberty    Seizures (HCC)    last seizure 2012 - no current med   Sleep apnea    White-Sutton syndrome    Past Surgical History:  Procedure Laterality Date   ADENOIDECTOMY     BALLOON DILATION N/A 11/23/2021   Procedure: BALLOON DILATION;  Surgeon: Tressia Danas, MD;  Location: [facility];  Service: Gastroenterology;  Laterality: N/A;   BIOPSY  07/16/2021   Procedure: BIOPSY;  Surgeon: Tressia Danas, MD;  Location: [facility] ENDOSCOPY;  Service: Gastroenterology;;   BIOPSY  11/23/2021   Procedure: BIOPSY;  Surgeon: Tressia Danas, MD;  Location: [facility];  Service: Gastroenterology;;   bone removal Left 01/16/2021   foot   COLONOSCOPY WITH PROPOFOL N/A 07/16/2021   Procedure: COLONOSCOPY WITH PROPOFOL;  Surgeon: Tressia Danas, MD;  Location: [facility] ENDOSCOPY;  Service: Gastroenterology;  Laterality: N/A;   ESOPHAGOGASTRODUODENOSCOPY (EGD) WITH PROPOFOL N/A 11/23/2021   Procedure:  ESOPHAGOGASTRODUODENOSCOPY (EGD) WITH PROPOFOL;  Surgeon: Tressia Danas, MD;  Location: [facility];  Service: Gastroenterology;  Laterality: N/A;   FOOT SURGERY Right 11/2022   GASTROCNEMIUS RECESSION Bilateral 04/25/2020   Procedure: BILATERAL GASTROCNEMIUS RECESSION;  Surgeon: Nadara Mustard, MD;  Location: [facility] OR;  Service: Orthopedics;  Laterality: Bilateral;   SUPPRELIN IMPLANT  05/06/2011   Procedure: SUPPRELIN IMPLANT;  Surgeon: Judie Petit. Leonia Corona, MD;  Location: [facility];  Service: Pediatrics;  Laterality: Right;   SUPPRELIN IMPLANT Right 06/14/2013   Procedure: REMOVAL OF SUPPRELIN IMPLANT FROM RIGHT UPPER ARM;  Surgeon: Judie Petit. Leonia Corona, MD;  Location: [facility];  Service: Pediatrics;  Laterality: Right;   TONSILLECTOMY     TONSILLECTOMY AND ADENOIDECTOMY     TYMPANOSTOMY TUBE PLACEMENT     x 2   Patient Active Problem List   Diagnosis Date Noted   Hyperhidrosis 12/30/2021   Dysphagia    OSA (obstructive sleep apnea) 09/30/2021   Bilateral hand numbness 09/15/2021   Acne vulgaris 08/26/2021   Noninfectious gastroenteritis    Bilateral arm pain 06/12/2021   DUB (dysfunctional uterine bleeding) 05/14/2021   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   Other allergic rhinitis 08/20/2020   Allergic conjunctivitis of both eyes 08/20/2020   Encounter for general adult medical examination with abnormal findings 07/03/2020   Leg length discrepancy 06/05/2020   Hidradenitis suppurativa  06/05/2020   Achilles tendon contracture, bilateral    Elevated sedimentation rate 02/07/2020   Pain in right ankle and joints of right foot 02/07/2020   Anemia 12/29/2018   Type 2 diabetes mellitus (HCC) 08/15/2017   Elevated triglycerides with high cholesterol 12/09/2016   Mild intellectual disability 08/16/2012   Laxity of ligament 08/16/2012   Delayed milestones 08/16/2012   Obesity 06/07/2011   Global developmental delay    Petit mal without grand mal seizures  (HCC)    Dyspraxia     PCP: Myrlene Broker, MD  REFERRING PROVIDER: Edwin Cap, DPM   REFERRING DIAG: [id number] (ICD-10-CM) - Coalition, calcaneal tarsal   THERAPY DIAG:  Pain in right ankle and joints of right foot  Muscle weakness (generalized)  Other abnormalities of gait and mobility  Rationale for Evaluation and Treatment: Rehabilitation  ONSET DATE: 12/24/2022  SUBJECTIVE:   SUBJECTIVE STATEMENT: Pt presents to PT s/p R foot surgery on 12/24/2022 for middle facet coalition resection. Notes pain in R medial foot but otherwise has been doing well ambulating with SPC and boot on R foot. Has been cleared for WBAT outside of boot. Has some occasional tingling in R foot but otherwise no paresthesias.   PERTINENT HISTORY: DM II, White Sutton syndrome  PAIN:  Are you having pain?  Yes: NPRS scale: 0/10 Worst: 7/10 Pain location: R medial ankle/foot Pain description: achy  Aggravating factors: walking, standing Relieving factors: rest  PRECAUTIONS: None  RED FLAGS: None   WEIGHT BEARING RESTRICTIONS: WBAT R LE  FALLS:  Has patient fallen in last 6 months? No  LIVING ENVIRONMENT: Lives with: lives with their family Lives in: House/apartment Stairs: Yes: Internal: 12 steps; on right going up Has following equipment at home: Single point cane  OCCUPATION: On disability  PLOF: Independent with basic ADLs  PATIENT GOALS: pt would like to get back to playing sports with friends for fun, decrease her foot pain  NEXT MD VISIT: 02/17/2023  OBJECTIVE:  Note: Objective measures were completed at Evaluation unless otherwise noted.  DIAGNOSTIC FINDINGS: See imaging  PATIENT SURVEYS:  FOTO: 50% function; 69% predicted  COGNITION: Overall cognitive status: Within functional limits for tasks assessed     SENSATION: WFL  POSTURE: rounded shoulders, forward head, and healing incisional scar on R medial foot  PALPATION: No overt TTP noted  LOWER  EXTREMITY ROM:  Active ROM Right eval Left eval  Hip flexion    Hip extension    Hip abduction    Hip adduction    Hip internal rotation    Hip external rotation    Knee flexion    Knee extension    Ankle dorsiflexion Lacking 5 10  Ankle plantarflexion    Ankle inversion    Ankle eversion     (Blank rows = not tested)  LOWER EXTREMITY MMT:  MMT Right eval Left eval  Hip flexion    Hip extension    Hip abduction    Hip adduction    Hip internal rotation    Hip external rotation    Knee flexion    Knee extension    Ankle dorsiflexion 3+/5 5/5  Ankle plantarflexion 3+/5 5/5  Ankle inversion 3+/5 5/5  Ankle eversion 3+/5 5/5   (Blank rows = not tested)  LOWER EXTREMITY SPECIAL TESTS:  DNT  FUNCTIONAL TESTS:  30 Second Sit to Stand: 8 reps   GAIT: Distance walked: 72ft Assistive device utilized: Single point cane Level of assistance: Complete Independence Comments: antalgic  TREATMENT: [facility] Adult PT Treatment:                                                DATE: 01/25/2023 Therapeutic Exercise: Tandem stance x 30" R Calf stretch with towel x 30" R Towel scrunch x 30" R Seated heel-toe raises x 10  PATIENT EDUCATION:  Education details: eval findings, FOTO, HEP, POC Person educated: Patient Education method: Explanation, Demonstration, and Handouts Education comprehension: verbalized understanding and returned demonstration  HOME EXERCISE PROGRAM: Access Code: [email or web address] URL: [email or web address] Date: 01/25/2023 Prepared by: Edwinna Areola  Exercises - Standing Tandem Balance with Counter Support  - 1 x daily - 7 x weekly - 3 sets - 30 sec hold - Long Sitting Calf Stretch with Strap  - 1 x daily - 7 x weekly - 3 reps - 30 sec hold - Towel Scrunches  - 1 x daily - 7 x weekly - 2 reps - 60 sec hold - Seated Heel Toe Raises  - 1 x daily - 7 x weekly - 3 sets - 20 reps  ASSESSMENT:  CLINICAL IMPRESSION: Patient is a 22 y.o. F who was  seen today for physical therapy evaluation and treatment for s/p R foot surgery on 12/24/2022 for middle facet coalition resection. Physical findings are consistent with surgery and recovery timeline as pt demonstrates decrease in R ankle strength, ROM, and balance. FOTO score demonstrates decrease in subjective functional ability below PLOF. Pt would benefit from skilled PT services working on normalizing gait, ankle strength and balance post surgery.   OBJECTIVE IMPAIRMENTS: Abnormal gait, decreased activity tolerance, decreased balance, decreased mobility, difficulty walking, decreased ROM, decreased strength, and pain  ACTIVITY LIMITATIONS: carrying, lifting, standing, squatting, stairs, transfers, and locomotion level  PARTICIPATION LIMITATIONS: meal prep, cleaning, driving, shopping, occupation, and yard work  PERSONAL FACTORS: 1-2 comorbidities: DM II, White Sutton syndrome  are also affecting patient's functional outcome.   REHAB POTENTIAL: Excellent  CLINICAL DECISION MAKING: Stable/uncomplicated  EVALUATION COMPLEXITY: Low   GOALS: Goals reviewed with patient? No  SHORT TERM GOALS: Target date: 02/15/2023   Pt will be compliant and knowledgeable with initial HEP for improved comfort and carryover Baseline: initial HEP given  Goal status: INITIAL  2.  Pt will self report R ankle/foot pain no greater than 5/10 for improved comfort and functional ability Baseline: 7/10 at worst Goal status: INITIAL   LONG TERM GOALS: Target date: 04/19/2023   Pt will improve FOTO function score to no less than 69% as proxy for functional improvement Baseline: 50% function Goal status: INITIAL   2.  Pt will self report R foot/ankle pain no greater than 2/10 for improved comfort and functional ability Baseline: 7/10 at worst Goal status: INITIAL   3.  Pt will increase 30 Second Sit to Stand rep count to no less than 12 reps for improved balance, strength, and functional mobility Baseline:  8 reps  Goal status: INITIAL   4.  Pt will improve R ankle DF AROM to no less 10 degrees for improved gait and decrease pain Baseline: lacking 5 Goal status: INITIAL  5.  Pt will be able be to get back to playing sports with friends not limited by R ankle/foot pain  Baseline: unable Goal status: INITIAL   PLAN:  PT FREQUENCY: 2x/week  PT DURATION: 8 weeks  PLANNED INTERVENTIONS: 97164- PT Re-evaluation, 97110-Therapeutic  exercises, 97530- Therapeutic activity, [id number]- Neuromuscular re-education, 97535- Self Care, [id number]- Manual therapy, [id number]- Gait training, 97014- Electrical stimulation (unattended), [id number]- Electrical stimulation (manual), 97016- Vasopneumatic device, Dry Needling, Cryotherapy, and Moist heat  PLAN FOR NEXT SESSION: assess HEP response, R ankle strength and balance, calf stretching    Eloy End, PT 01/25/2023, 3:32 PM

## 2023-02-03 ENCOUNTER — Encounter: Payer: 59 | Admitting: Podiatry

## 2023-02-08 ENCOUNTER — Ambulatory Visit: Payer: 59 | Attending: Internal Medicine | Admitting: Physical Therapy

## 2023-02-08 ENCOUNTER — Encounter: Payer: Self-pay | Admitting: Physical Therapy

## 2023-02-08 DIAGNOSIS — R2689 Other abnormalities of gait and mobility: Secondary | ICD-10-CM | POA: Diagnosis not present

## 2023-02-08 DIAGNOSIS — M6281 Muscle weakness (generalized): Secondary | ICD-10-CM | POA: Insufficient documentation

## 2023-02-08 DIAGNOSIS — M25571 Pain in right ankle and joints of right foot: Secondary | ICD-10-CM | POA: Insufficient documentation

## 2023-02-08 NOTE — Therapy (Signed)
OUTPATIENT PHYSICAL THERAPY LOWER EXTREMITY TREATMENT   Patient Name: Deanna Levine MRN: [id number] DOB:[birth year], 22 y.o., female Today's Date: 02/08/2023  END OF SESSION:  PT End of Session - 02/08/23 0935     Visit Number 2    Number of Visits 24    Date for PT Re-Evaluation 04/19/23    Authorization Type UHC Dual Complete    PT Start Time 0933    PT Stop Time 1015    PT Time Calculation (min) 42 min             Past Medical History:  Diagnosis Date   Absence seizure disorder (HCC)    Allergy    Colitis 07/16/2021   Complication of anesthesia    slow to wake up from anesthesia, occ HA with anesthesia    Diabetes mellitus without complication (HCC)    type 2   Difficulty swallowing pills    Dyspraxia    Eczema    Global developmental delay    Obesity (BMI 30-39.9)    PONV (postoperative nausea and vomiting)    Precocious female puberty    Seizures (HCC)    last seizure 2012 - no current med   Sleep apnea    White-Sutton syndrome    Past Surgical History:  Procedure Laterality Date   ADENOIDECTOMY     BALLOON DILATION N/A 11/23/2021   Procedure: BALLOON DILATION;  Surgeon: Tressia Danas, MD;  Location: [facility] ENDOSCOPY;  Service: Gastroenterology;  Laterality: N/A;   BIOPSY  07/16/2021   Procedure: BIOPSY;  Surgeon: Tressia Danas, MD;  Location: [facility] ENDOSCOPY;  Service: Gastroenterology;;   BIOPSY  11/23/2021   Procedure: BIOPSY;  Surgeon: Tressia Danas, MD;  Location: [facility]OS[facility];  Service: Gastroenterology;;   bone removal Left 01/16/2021   foot   COLONOSCOPY WITH PROPOFOL N/A 07/16/2021   Procedure: COLONOSCOPY WITH PROPOFOL;  Surgeon: Tressia Danas, MD;  Location: [facility] ENDOSCOPY;  Service: Gastroenterology;  Laterality: N/A;   ESOPHAGOGASTRODUODENOSCOPY (EGD) WITH PROPOFOL N/A 11/23/2021   Procedure: ESOPHAGOGASTRODUODENOSCOPY (EGD) WITH PROPOFOL;  Surgeon: Tressia Danas, MD;  Location: [facility];  Service: Gastroenterology;   Laterality: N/A;   FOOT SURGERY Right 11/2022   GASTROCNEMIUS RECESSION Bilateral 04/25/2020   Procedure: BILATERAL GASTROCNEMIUS RECESSION;  Surgeon: Nadara Mustard, MD;  Location: [facility] OR;  Service: Orthopedics;  Laterality: Bilateral;   SUPPRELIN IMPLANT  05/06/2011   Procedure: SUPPRELIN IMPLANT;  Surgeon: Judie Petit. Leonia Corona, MD;  Location: [facility];  Service: Pediatrics;  Laterality: Right;   SUPPRELIN IMPLANT Right 06/14/2013   Procedure: REMOVAL OF SUPPRELIN IMPLANT FROM RIGHT UPPER ARM;  Surgeon: Judie Petit. Leonia Corona, MD;  Location: [facility];  Service: Pediatrics;  Laterality: Right;   TONSILLECTOMY     TONSILLECTOMY AND ADENOIDECTOMY     TYMPANOSTOMY TUBE PLACEMENT     x 2   Patient Active Problem List   Diagnosis Date Noted   Hyperhidrosis 12/30/2021   Dysphagia    OSA (obstructive sleep apnea) 09/30/2021   Bilateral hand numbness 09/15/2021   Acne vulgaris 08/26/2021   Noninfectious gastroenteritis    Bilateral arm pain 06/12/2021   DUB (dysfunctional uterine bleeding) 05/14/2021   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   Other allergic rhinitis 08/20/2020   Allergic conjunctivitis of both eyes 08/20/2020   Encounter for general adult medical examination with abnormal findings 07/03/2020   Leg length discrepancy 06/05/2020   Hidradenitis suppurativa 06/05/2020   Achilles tendon contracture, bilateral    Elevated sedimentation rate 02/07/2020   Pain in right  ankle and joints of right foot 02/07/2020   Anemia 12/29/2018   Type 2 diabetes mellitus (HCC) 08/15/2017   Elevated triglycerides with high cholesterol 12/09/2016   Mild intellectual disability 08/16/2012   Laxity of ligament 08/16/2012   Delayed milestones 08/16/2012   Obesity 06/07/2011   Global developmental delay    Petit mal without grand mal seizures (HCC)    Dyspraxia     PCP: Myrlene Broker, MD  REFERRING PROVIDER: Edwin Cap, DPM   REFERRING DIAG: [id number]  (ICD-10-CM) - Coalition, calcaneal tarsal   THERAPY DIAG:  Pain in right ankle and joints of right foot  Muscle weakness (generalized)  Rationale for Evaluation and Treatment: Rehabilitation  ONSET DATE: 12/24/2022  SUBJECTIVE:   SUBJECTIVE STATEMENT: 5/10 pain on arrival, no AD.   Pt presents to PT s/p R foot surgery on 12/24/2022 for middle facet coalition resection. Notes pain in R medial foot but otherwise has been doing well ambulating with SPC and boot on R foot. Has been cleared for WBAT outside of boot. Has some occasional tingling in R foot but otherwise no paresthesias.   PERTINENT HISTORY: DM II, White Sutton syndrome  PAIN:  Are you having pain?  Yes: NPRS scale: 0/10 Worst: 7/10 Pain location: R medial ankle/foot Pain description: achy  Aggravating factors: walking, standing Relieving factors: rest  PRECAUTIONS: None  RED FLAGS: None   WEIGHT BEARING RESTRICTIONS: WBAT R LE  FALLS:  Has patient fallen in last 6 months? No  LIVING ENVIRONMENT: Lives with: lives with their family Lives in: House/apartment Stairs: Yes: Internal: 12 steps; on right going up Has following equipment at home: Single point cane  OCCUPATION: On disability  PLOF: Independent with basic ADLs  PATIENT GOALS: pt would like to get back to playing sports with friends for fun, decrease her foot pain  NEXT MD VISIT: 02/17/2023  OBJECTIVE:  Note: Objective measures were completed at Evaluation unless otherwise noted.  DIAGNOSTIC FINDINGS: See imaging  PATIENT SURVEYS:  FOTO: 50% function; 69% predicted  COGNITION: Overall cognitive status: Within functional limits for tasks assessed     SENSATION: WFL  POSTURE: rounded shoulders, forward head, and healing incisional scar on R medial foot  PALPATION: No overt TTP noted  LOWER EXTREMITY ROM:  Active ROM Right eval Left eval  Hip flexion    Hip extension    Hip abduction    Hip adduction    Hip internal  rotation    Hip external rotation    Knee flexion    Knee extension    Ankle dorsiflexion Lacking 5 10  Ankle plantarflexion    Ankle inversion    Ankle eversion     (Blank rows = not tested)  LOWER EXTREMITY MMT:  MMT Right eval Left eval  Hip flexion    Hip extension    Hip abduction    Hip adduction    Hip internal rotation    Hip external rotation    Knee flexion    Knee extension    Ankle dorsiflexion 3+/5 5/5  Ankle plantarflexion 3+/5 5/5  Ankle inversion 3+/5 5/5  Ankle eversion 3+/5 5/5   (Blank rows = not tested)  LOWER EXTREMITY SPECIAL TESTS:  DNT  FUNCTIONAL TESTS:  30 Second Sit to Stand: 8 reps    GAIT: Distance walked: 4ft Assistive device utilized: Single point cane Level of assistance: Complete Independence Comments: antalgic    TREATMENT: [facility] Adult PT Treatment:  DATE: 02/08/23 Therapeutic Exercise: Seated toe flexion and extensions Seated heel and toe raises Seated ankle pumps/ circles Seated calf stretch with towel 30 sec x 3  Standing heel and toe raises Tandem stance 20 RLE SLS 4 sec R, 15 sec L Runners stretch rocking for DF mob Manual Therapy: Right ankle mobs  PROM right ankle   Therapeutic Activity: Gait cues for equal step and heel strike    [facility] Adult PT Treatment:                                                DATE: 01/25/2023 Therapeutic Exercise: Tandem stance x 30" R Calf stretch with towel x 30" R Towel scrunch x 30" R Seated heel-toe raises x 10  PATIENT EDUCATION:  Education details: eval findings, FOTO, HEP, POC Person educated: Patient Education method: Explanation, Demonstration, and Handouts Education comprehension: verbalized understanding and returned demonstration  HOME EXERCISE PROGRAM: Access Code: [email or web address] URL: [email or web address] Date: 01/25/2023 Prepared by: Edwinna Areola  Exercises - Standing Tandem Balance with Counter  Support  - 1 x daily - 7 x weekly - 3 sets - 30 sec hold - Long Sitting Calf Stretch with Strap  - 1 x daily - 7 x weekly - 3 reps - 30 sec hold - Towel Scrunches  - 1 x daily - 7 x weekly - 2 reps - 60 sec hold - Seated Heel Toe Raises  - 1 x daily - 7 x weekly - 3 sets - 20 reps  - Heel Toe Raises with Counter Support  - 1 x daily - 7 x weekly - 3 sets - 10 reps - Single Leg Stance  - 1 x daily - 7 x weekly - 1 sets - 10 reps - Sit to stand with control  - 1 x daily - 7 x weekly - 1 sets - 10 reps - Standing Gastroc Stretch at Counter  - 1 x daily - 7 x weekly - 1 sets - 10 reps - 5 hold  ASSESSMENT:  CLINICAL IMPRESSION: Pt arrives with 5/10 pain. She reports compliance with HEP. Used manual ankle mobs and passive stretching to increase ankle ROM. Pt has improved DF mildly. Progressed closed chain ankle strength and mobility and updated HEP. No complaints of increased pain during session.   EVAL: Patient is a 22 y.o. F who was seen today for physical therapy evaluation and treatment for s/p R foot surgery on 12/24/2022 for middle facet coalition resection. Physical findings are consistent with surgery and recovery timeline as pt demonstrates decrease in R ankle strength, ROM, and balance. FOTO score demonstrates decrease in subjective functional ability below PLOF. Pt would benefit from skilled PT services working on normalizing gait, ankle strength and balance post surgery.   OBJECTIVE IMPAIRMENTS: Abnormal gait, decreased activity tolerance, decreased balance, decreased mobility, difficulty walking, decreased ROM, decreased strength, and pain  ACTIVITY LIMITATIONS: carrying, lifting, standing, squatting, stairs, transfers, and locomotion level  PARTICIPATION LIMITATIONS: meal prep, cleaning, driving, shopping, occupation, and yard work  PERSONAL FACTORS: 1-2 comorbidities: DM II, White Sutton syndrome  are also affecting patient's functional outcome.   REHAB POTENTIAL:  Excellent  CLINICAL DECISION MAKING: Stable/uncomplicated  EVALUATION COMPLEXITY: Low   GOALS: Goals reviewed with patient? No  SHORT TERM GOALS: Target date: 02/15/2023   Pt will be compliant and knowledgeable with initial HEP for improved comfort  and carryover Baseline: initial HEP given  02/08/23: requires mod cues Goal status: ONGOING  2.  Pt will self report R ankle/foot pain no greater than 5/10 for improved comfort and functional ability Baseline: 7/10 at worst Goal status: INITIAL   LONG TERM GOALS: Target date: 04/19/2023   Pt will improve FOTO function score to no less than 69% as proxy for functional improvement Baseline: 50% function Goal status: INITIAL   2.  Pt will self report R foot/ankle pain no greater than 2/10 for improved comfort and functional ability Baseline: 7/10 at worst Goal status: INITIAL   3.  Pt will increase 30 Second Sit to Stand rep count to no less than 12 reps for improved balance, strength, and functional mobility Baseline: 8 reps  Goal status: INITIAL   4.  Pt will improve R ankle DF AROM to no less 10 degrees for improved gait and decrease pain Baseline: lacking 5 Goal status: INITIAL  5.  Pt will be able be to get back to playing sports with friends not limited by R ankle/foot pain  Baseline: unable Goal status: INITIAL   PLAN:  PT FREQUENCY: 2x/week  PT DURATION: 8 weeks  PLANNED INTERVENTIONS: 97164- PT Re-evaluation, 97110-Therapeutic exercises, 97530- Therapeutic activity, 97112- Neuromuscular re-education, 97535- Self Care, 24401- Manual therapy, [id number]- Gait training, 97014- Electrical stimulation (unattended), [id number]- Electrical stimulation (manual), 97016- Vasopneumatic device, Dry Needling, Cryotherapy, and Moist heat  PLAN FOR NEXT SESSION: assess HEP response, R ankle strength and balance, calf stretching    Jannette Spanner, PTA 02/08/23 12:45 PM Phone: [phone] Fax: [phone]

## 2023-02-10 ENCOUNTER — Ambulatory Visit: Payer: 59 | Admitting: Behavioral Health

## 2023-02-10 DIAGNOSIS — F419 Anxiety disorder, unspecified: Secondary | ICD-10-CM

## 2023-02-10 NOTE — Progress Notes (Signed)
                Deanna Levine Farryn Linares, LMFT 

## 2023-02-16 DIAGNOSIS — Z79899 Other long term (current) drug therapy: Secondary | ICD-10-CM | POA: Diagnosis not present

## 2023-02-16 DIAGNOSIS — L700 Acne vulgaris: Secondary | ICD-10-CM | POA: Diagnosis not present

## 2023-02-16 DIAGNOSIS — R61 Generalized hyperhidrosis: Secondary | ICD-10-CM | POA: Diagnosis not present

## 2023-02-16 DIAGNOSIS — L2084 Intrinsic (allergic) eczema: Secondary | ICD-10-CM | POA: Diagnosis not present

## 2023-02-16 DIAGNOSIS — L732 Hidradenitis suppurativa: Secondary | ICD-10-CM | POA: Diagnosis not present

## 2023-02-16 NOTE — Therapy (Signed)
OUTPATIENT PHYSICAL THERAPY LOWER EXTREMITY TREATMENT   Patient Name: Deanna Levine MRN: [id number] DOB:2000/09/15, 22 y.o., female Today's Date: 02/17/2023  END OF SESSION:  PT End of Session - 02/17/23 1308     Visit Number 3    Number of Visits 24    Date for PT Re-Evaluation 04/19/23    Authorization Type UHC Dual Complete    PT Start Time 1310    PT Stop Time 1350    PT Time Calculation (min) 40 min    Activity Tolerance Patient tolerated treatment well              Past Medical History:  Diagnosis Date   Absence seizure disorder (HCC)    Allergy    Colitis 07/16/2021   Complication of anesthesia    slow to wake up from anesthesia, occ HA with anesthesia    Diabetes mellitus without complication (HCC)    type 2   Difficulty swallowing pills    Dyspraxia    Eczema    Global developmental delay    Obesity (BMI 30-39.9)    PONV (postoperative nausea and vomiting)    Precocious female puberty    Seizures (HCC)    last seizure 2012 - no current med   Sleep apnea    White-Sutton syndrome    Past Surgical History:  Procedure Laterality Date   ADENOIDECTOMY     BALLOON DILATION N/A 11/23/2021   Procedure: BALLOON DILATION;  Surgeon: Tressia Danas, MD;  Location: [facility] ENDOSCOPY;  Service: Gastroenterology;  Laterality: N/A;   BIOPSY  07/16/2021   Procedure: BIOPSY;  Surgeon: Tressia Danas, MD;  Location: [facility] ENDOSCOPY;  Service: Gastroenterology;;   BIOPSY  11/23/2021   Procedure: BIOPSY;  Surgeon: Tressia Danas, MD;  Location: [facility] ENDOSCOPY;  Service: Gastroenterology;;   bone removal Left 01/16/2021   foot   COLONOSCOPY WITH PROPOFOL N/A 07/16/2021   Procedure: COLONOSCOPY WITH PROPOFOL;  Surgeon: Tressia Danas, MD;  Location: [facility] ENDOSCOPY;  Service: Gastroenterology;  Laterality: N/A;   ESOPHAGOGASTRODUODENOSCOPY (EGD) WITH PROPOFOL N/A 11/23/2021   Procedure: ESOPHAGOGASTRODUODENOSCOPY (EGD) WITH PROPOFOL;  Surgeon: Tressia Danas, MD;   Location: [facility] END[facility];  Service: Gastroenterology;  Laterality: N/A;   FOOT SURGERY Right 11/2022   GASTROCNEMIUS RECESSION Bilateral 04/25/2020   Procedure: BILATERAL GASTROCNEMIUS RECESSION;  Surgeon: Nadara Mustard, MD;  Location: [facility] OR;  Service: Orthopedics;  Laterality: Bilateral;   SUPPRELIN IMPLANT  05/06/2011   Procedure: SUPPRELIN IMPLANT;  Surgeon: Judie Petit. Leonia Corona, MD;  Location: [facility];  Service: Pediatrics;  Laterality: Right;   SUPPRELIN IMPLANT Right 06/14/2013   Procedure: REMOVAL OF SUPPRELIN IMPLANT FROM RIGHT UPPER ARM;  Surgeon: Judie Petit. Leonia Corona, MD;  Location: [facility];  Service: Pediatrics;  Laterality: Right;   TONSILLECTOMY     TONSILLECTOMY AND ADENOIDECTOMY     TYMPANOSTOMY TUBE PLACEMENT     x 2   Patient Active Problem List   Diagnosis Date Noted   Hyperhidrosis 12/30/2021   Dysphagia    OSA (obstructive sleep apnea) 09/30/2021   Bilateral hand numbness 09/15/2021   Acne vulgaris 08/26/2021   Noninfectious gastroenteritis    Bilateral arm pain 06/12/2021   DUB (dysfunctional uterine bleeding) 05/14/2021   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   Other allergic rhinitis 08/20/2020   Allergic conjunctivitis of both eyes 08/20/2020   Encounter for general adult medical examination with abnormal findings 07/03/2020   Leg length discrepancy 06/05/2020   Hidradenitis suppurativa 06/05/2020   Achilles tendon contracture, bilateral  Elevated sedimentation rate 02/07/2020   Pain in right ankle and joints of right foot 02/07/2020   Anemia 12/29/2018   Type 2 diabetes mellitus (HCC) 08/15/2017   Elevated triglycerides with high cholesterol 12/09/2016   Mild intellectual disability 08/16/2012   Laxity of ligament 08/16/2012   Delayed milestones 08/16/2012   Obesity 06/07/2011   Global developmental delay    Petit mal without grand mal seizures (HCC)    Dyspraxia     PCP: Myrlene Broker, MD  REFERRING PROVIDER:  Edwin Cap, DPM   REFERRING DIAG: [id number] (ICD-10-CM) - Coalition, calcaneal tarsal   THERAPY DIAG:  Pain in right ankle and joints of right foot  Muscle weakness (generalized)  Other abnormalities of gait and mobility  Rationale for Evaluation and Treatment: Rehabilitation  ONSET DATE: 12/24/2022  SUBJECTIVE:   SUBJECTIVE STATEMENT: 02/17/2023 No pain at present, states follow up with surgeon went well this morning, encouraged walking and strengthening. No issues after last session, HEP going well.   Per eval - Pt presents to PT s/p R foot surgery on 12/24/2022 for middle facet coalition resection. Notes pain in R medial foot but otherwise has been doing well ambulating with SPC and boot on R foot. Has been cleared for WBAT outside of boot. Has some occasional tingling in R foot but otherwise no paresthesias.   PERTINENT HISTORY: DM II, White Sutton syndrome  PAIN:  Are you having pain?  Yes: NPRS scale: 0/10 Worst: 7/10 Pain location: R medial ankle/foot Pain description: achy  Aggravating factors: walking, standing Relieving factors: rest  PRECAUTIONS: None  RED FLAGS: None   WEIGHT BEARING RESTRICTIONS: WBAT R LE  FALLS:  Has patient fallen in last 6 months? No  LIVING ENVIRONMENT: Lives with: lives with their family Lives in: House/apartment Stairs: Yes: Internal: 12 steps; on right going up Has following equipment at home: Single point cane  OCCUPATION: On disability  PLOF: Independent with basic ADLs  PATIENT GOALS: pt would like to get back to playing sports with friends for fun, decrease her foot pain  NEXT MD VISIT: 02/17/2023  OBJECTIVE:  Note: Objective measures were completed at Evaluation unless otherwise noted.  DIAGNOSTIC FINDINGS: See imaging  PATIENT SURVEYS:  FOTO: 50% function; 69% predicted  COGNITION: Overall cognitive status: Within functional limits for tasks assessed     SENSATION: WFL  POSTURE: rounded shoulders,  forward head, and healing incisional scar on R medial foot  PALPATION: No overt TTP noted  LOWER EXTREMITY ROM:  Active ROM Right eval Left eval  Hip flexion    Hip extension    Hip abduction    Hip adduction    Hip internal rotation    Hip external rotation    Knee flexion    Knee extension    Ankle dorsiflexion Lacking 5 10  Ankle plantarflexion    Ankle inversion    Ankle eversion     (Blank rows = not tested)  LOWER EXTREMITY MMT:  MMT Right eval Left eval  Hip flexion    Hip extension    Hip abduction    Hip adduction    Hip internal rotation    Hip external rotation    Knee flexion    Knee extension    Ankle dorsiflexion 3+/5 5/5  Ankle plantarflexion 3+/5 5/5  Ankle inversion 3+/5 5/5  Ankle eversion 3+/5 5/5   (Blank rows = not tested)  LOWER EXTREMITY SPECIAL TESTS:  DNT  FUNCTIONAL TESTS:  30 Second Sit to Stand:  8 reps    GAIT: Distance walked: 80ft Assistive device utilized: Single point cane Level of assistance: Complete Independence Comments: antalgic    TREATMENT: [facility] Adult PT Treatment:                                                DATE: 02/17/23 Therapeutic Exercise: Seated heel/toe raises 2x15 Standing soleus stretch, gentle rocking x8 cues for setup, wall support Standing gastroc stretch gentle rocking x8 cues for positioning  Seated red band PF x15 cues for positioning Seated red band DF 2x8 PT assisted cues for control Staggered stance weight shifts BIL 2x8 w/ UE support cues for mechanics Seated toe extensions x10 cues for tripod foot Seated hallux ext 2x10 cues for tripod  L3 BAPS DF/PF x10 L3 BAPS circles x10 CW/CCW     [facility] Adult PT Treatment:                                                DATE: 02/08/23 Therapeutic Exercise: Seated toe flexion and extensions Seated heel and toe raises Seated ankle pumps/ circles Seated calf stretch with towel 30 sec x 3  Standing heel and toe raises Tandem stance 20 RLE SLS 4  sec R, 15 sec L Runners stretch rocking for DF mob Manual Therapy: Right ankle mobs  PROM right ankle   Therapeutic Activity: Gait cues for equal step and heel strike    [facility] Adult PT Treatment:                                                DATE: 01/25/2023 Therapeutic Exercise: Tandem stance x 30" R Calf stretch with towel x 30" R Towel scrunch x 30" R Seated heel-toe raises x 10  PATIENT EDUCATION:  Education details: rationale for interventions, HEP  Person educated: Patient Education method: Explanation, Demonstration, Tactile cues, Verbal cues Education comprehension: verbalized understanding, returned demonstration, verbal cues required, tactile cues required, and needs further education     HOME EXERCISE PROGRAM: Access Code: [email or web address] URL: [email or web address] Date: 01/25/2023 Prepared by: Edwinna Areola  Exercises - Standing Tandem Balance with Counter Support  - 1 x daily - 7 x weekly - 3 sets - 30 sec hold - Long Sitting Calf Stretch with Strap  - 1 x daily - 7 x weekly - 3 reps - 30 sec hold - Towel Scrunches  - 1 x daily - 7 x weekly - 2 reps - 60 sec hold - Seated Heel Toe Raises  - 1 x daily - 7 x weekly - 3 sets - 20 reps  - Heel Toe Raises with Counter Support  - 1 x daily - 7 x weekly - 3 sets - 10 reps - Single Leg Stance  - 1 x daily - 7 x weekly - 1 sets - 10 reps - Sit to stand with control  - 1 x daily - 7 x weekly - 1 sets - 10 reps - Standing Gastroc Stretch at Counter  - 1 x daily - 7 x weekly - 1 sets - 10 reps - 5 hold  ASSESSMENT:  CLINICAL IMPRESSION: 02/17/2023 Pt arrives and denies pain, reports surgical follow up went well this morning. Today continuing to work on ankle/foot mobility and strengthening which she does well with. No adverse events, denies any pain on departure. Recommend continuing along current POC in order to address relevant deficits and improve functional tolerance. Pt departs today's session in no acute  distress, all voiced questions/concerns addressed appropriately from PT perspective.     EVAL: Patient is a 22 y.o. F who was seen today for physical therapy evaluation and treatment for s/p R foot surgery on 12/24/2022 for middle facet coalition resection. Physical findings are consistent with surgery and recovery timeline as pt demonstrates decrease in R ankle strength, ROM, and balance. FOTO score demonstrates decrease in subjective functional ability below PLOF. Pt would benefit from skilled PT services working on normalizing gait, ankle strength and balance post surgery.   OBJECTIVE IMPAIRMENTS: Abnormal gait, decreased activity tolerance, decreased balance, decreased mobility, difficulty walking, decreased ROM, decreased strength, and pain  ACTIVITY LIMITATIONS: carrying, lifting, standing, squatting, stairs, transfers, and locomotion level  PARTICIPATION LIMITATIONS: meal prep, cleaning, driving, shopping, occupation, and yard work  PERSONAL FACTORS: 1-2 comorbidities: DM II, White Sutton syndrome  are also affecting patient's functional outcome.   REHAB POTENTIAL: Excellent  CLINICAL DECISION MAKING: Stable/uncomplicated  EVALUATION COMPLEXITY: Low   GOALS: Goals reviewed with patient? No  SHORT TERM GOALS: Target date: 02/15/2023   Pt will be compliant and knowledgeable with initial HEP for improved comfort and carryover Baseline: initial HEP given  02/08/23: requires mod cues Goal status: ONGOING  2.  Pt will self report R ankle/foot pain no greater than 5/10 for improved comfort and functional ability Baseline: 7/10 at worst Goal status: INITIAL   LONG TERM GOALS: Target date: 04/19/2023   Pt will improve FOTO function score to no less than 69% as proxy for functional improvement Baseline: 50% function Goal status: INITIAL   2.  Pt will self report R foot/ankle pain no greater than 2/10 for improved comfort and functional ability Baseline: 7/10 at worst Goal  status: INITIAL   3.  Pt will increase 30 Second Sit to Stand rep count to no less than 12 reps for improved balance, strength, and functional mobility Baseline: 8 reps  Goal status: INITIAL   4.  Pt will improve R ankle DF AROM to no less 10 degrees for improved gait and decrease pain Baseline: lacking 5 Goal status: INITIAL  5.  Pt will be able be to get back to playing sports with friends not limited by R ankle/foot pain  Baseline: unable Goal status: INITIAL   PLAN:  PT FREQUENCY: 2x/week  PT DURATION: 8 weeks  PLANNED INTERVENTIONS: 97164- PT Re-evaluation, 97110-Therapeutic exercises, 97530- Therapeutic activity, 97112- Neuromuscular re-education, 97535- Self Care, 14782- Manual therapy, [id number]- Gait training, 97014- Electrical stimulation (unattended), [id number]- Electrical stimulation (manual), 97016- Vasopneumatic device, Dry Needling, Cryotherapy, and Moist heat  PLAN FOR NEXT SESSION: review/update HEP PRN, R ankle strength and balance, calf stretching    Ethelreda Murrain PT, DPT 02/17/2023 1:52 PM

## 2023-02-17 ENCOUNTER — Ambulatory Visit (INDEPENDENT_AMBULATORY_CARE_PROVIDER_SITE_OTHER): Payer: 59 | Admitting: Podiatry

## 2023-02-17 ENCOUNTER — Encounter: Payer: Self-pay | Admitting: Physical Therapy

## 2023-02-17 ENCOUNTER — Ambulatory Visit (INDEPENDENT_AMBULATORY_CARE_PROVIDER_SITE_OTHER): Payer: 59

## 2023-02-17 ENCOUNTER — Ambulatory Visit: Payer: 59 | Admitting: Physical Therapy

## 2023-02-17 DIAGNOSIS — M6281 Muscle weakness (generalized): Secondary | ICD-10-CM | POA: Diagnosis not present

## 2023-02-17 DIAGNOSIS — Q6689 Other  specified congenital deformities of feet: Secondary | ICD-10-CM

## 2023-02-17 DIAGNOSIS — R2689 Other abnormalities of gait and mobility: Secondary | ICD-10-CM

## 2023-02-17 DIAGNOSIS — M25571 Pain in right ankle and joints of right foot: Secondary | ICD-10-CM | POA: Diagnosis not present

## 2023-02-17 NOTE — Progress Notes (Signed)
  Subjective:  Patient ID: Deanna Levine, female    DOB: [birth year],  MRN: [id number]  Chief Complaint  Patient presents with   Ankle Pain    Right side, she has no concerns.     DOS: 12/24/2022 Procedure: Middle facet coalition resection  22 y.o. female returns for post-op check.  [name] having a low bit of pain when walking but is walking in regular shoes  Review of Systems: Negative except as noted in the HPI. Denies N/V/F/Ch.   Objective:  There were no vitals filed for this visit. There is no height or weight on file to calculate BMI. Constitutional Well developed. Well nourished.  Vascular Foot warm and well perfused. Capillary refill normal to all digits.  Calf is soft and supple, no posterior calf or knee pain, negative Homans' sign  Neurologic Normal speech. Oriented to person, place, and time. Epicritic sensation to light touch grossly present bilaterally.  Dermatologic Incision well healed  Orthopedic: No heat edema or pain to palpation along the incision currently.  Still some stiffness and STJ range of motion.  [name] able to walk without a limp in a regular shoe   Multiple view plain film radiographs: Resection of the coalition noted on axial view much improved Assessment:   1. Coalition, calcaneal tarsal    Plan:  Patient was evaluated and treated and all questions answered.  S/p foot surgery right -Again I emphasized the importance of range of motion strengthening and she will continue her home therapy and formal PT.  She may continue on regular shoe gear should focus on range of motion of subtalar and ankle joint.  Advised her to walk is much as possible and regular shoes that she is not going to do any damage for it and that continued function and range of motion will prevent scar tissue and stiffness from forming the subtalar joint from the resection.  Return in about 3 months (around 05/18/2023) for follow up from R foot surgery (new xrays incl calc axial  view).

## 2023-02-18 NOTE — Therapy (Signed)
OUTPATIENT PHYSICAL THERAPY LOWER EXTREMITY TREATMENT   Patient Name: Deanna Levine MRN: [id number] DOB:06/11/2000, 22 y.o., female Today's Date: 02/21/2023  END OF SESSION:  PT End of Session - 02/21/23 1617     Visit Number 4    Number of Visits 24    Date for PT Re-Evaluation 04/19/23    Authorization Type [employer] Dual Complete    PT Start Time 1617    PT Stop Time 1656    PT Time Calculation (min) 39 min    Activity Tolerance Patient tolerated treatment well               Past Medical History:  Diagnosis Date   Absence seizure disorder (HCC)    Allergy    Colitis 07/16/2021   Complication of anesthesia    slow to wake up from anesthesia, occ HA with anesthesia    Diabetes mellitus without complication (HCC)    type 2   Difficulty swallowing pills    Dyspraxia    Eczema    Global developmental delay    Obesity (BMI 30-39.9)    PONV (postoperative nausea and vomiting)    Precocious female puberty    Seizures (HCC)    last seizure 2012 - no current med   Sleep apnea    White-Sutton syndrome    Past Surgical History:  Procedure Laterality Date   ADENOIDECTOMY     BALLOON DILATION N/A 11/23/2021   Procedure: BALLOON DILATION;  Surgeon: Tressia Danas, MD;  Location: [facility];  Service: Gastroenterology;  Laterality: N/A;   BIOPSY  07/16/2021   Procedure: BIOPSY;  Surgeon: Tressia Danas, MD;  Location[facility] ENDOSCOPY;  Service: Gastroenterology;;   BIOPSY  11/23/2021   Procedure: BIOPSY;  Surgeon: Tressia Danas, MD;  Location: [facility];  Service: Gastroenterology;;   bone removal Left 01/16/2021   foot   COLONOSCOPY WITH PROPOFOL N/A 07/16/2021   Procedure: COLONOSCOPY WITH PROPOFOL;  Surgeon: Tressia Danas, MD;  Location: [facility] ENDOSCOPY;  Service: Gastroenterology;  Laterality: N/A;   ESOPHAGOGASTRODUODENOSCOPY (EGD) WITH PROPOFOL N/A 11/23/2021   Procedure: ESOPHAGOGASTRODUODENOSCOPY (EGD) WITH PROPOFOL;  Surgeon: Tressia Danas, MD;   Location: [facility];  Service: Gastroenterology;  Laterality: N/A;   FOOT SURGERY Right 11/2022   GASTROCNEMIUS RECESSION Bilateral 04/25/2020   Procedure: BILATERAL GASTROCNEMIUS RECESSION;  Surgeon: Nadara Mustard, MD;  Location: [facility] OR;  Service: Orthopedics;  Laterality: Bilateral;   SUPPRELIN IMPLANT  05/06/2011   Procedure: SUPPRELIN IMPLANT;  Surgeon: Judie Petit. Leonia Corona, MD;  Location: [facility];  Service: Pediatrics;  Laterality: Right;   SUPPRELIN IMPLANT Right 06/14/2013   Procedure: REMOVAL OF SUPPRELIN IMPLANT FROM RIGHT UPPER ARM;  Surgeon: Judie Petit. Leonia Corona, MD;  Location: [facility];  Service: Pediatrics;  Laterality: Right;   TONSILLECTOMY     TONSILLECTOMY AND ADENOIDECTOMY     TYMPANOSTOMY TUBE PLACEMENT     x 2   Patient Active Problem List   Diagnosis Date Noted   Hyperhidrosis 12/30/2021   Dysphagia    OSA (obstructive sleep apnea) 09/30/2021   Bilateral hand numbness 09/15/2021   Acne vulgaris 08/26/2021   Noninfectious gastroenteritis    Bilateral arm pain 06/12/2021   DUB (dysfunctional uterine bleeding) 05/14/2021   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   Other allergic rhinitis 08/20/2020   Allergic conjunctivitis of both eyes 08/20/2020   Encounter for general adult medical examination with abnormal findings 07/03/2020   Leg length discrepancy 06/05/2020   Hidradenitis suppurativa 06/05/2020   Achilles tendon contracture, bilateral  Elevated sedimentation rate 02/07/2020   Pain in right ankle and joints of right foot 02/07/2020   Anemia 12/29/2018   Type 2 diabetes mellitus (HCC) 08/15/2017   Elevated triglycerides with high cholesterol 12/09/2016   Mild intellectual disability 08/16/2012   Laxity of ligament 08/16/2012   Delayed milestones 08/16/2012   Obesity 06/07/2011   Global developmental delay    Petit mal without grand mal seizures (HCC)    Dyspraxia     PCP: Myrlene Broker, MD  REFERRING PROVIDER:  Edwin Cap, DPM   REFERRING DIAG: [phone] (ICD-10-CM) - Coalition, calcaneal tarsal   THERAPY DIAG:  Pain in right ankle and joints of right foot  Muscle weakness (generalized)  Other abnormalities of gait and mobility  Rationale for Evaluation and Treatment: Rehabilitation  ONSET DATE: 12/24/2022  SUBJECTIVE:   SUBJECTIVE STATEMENT: 02/21/2023 No pain at present, states she did well after last session. Still having some fluctuating pain with walking, no new updates   Per eval - Pt presents to PT s/p R foot surgery on 12/24/2022 for middle facet coalition resection. Notes pain in R medial foot but otherwise has been doing well ambulating with SPC and boot on R foot. Has been cleared for WBAT outside of boot. Has some occasional tingling in R foot but otherwise no paresthesias.   PERTINENT HISTORY: DM II, White Sutton syndrome  PAIN:  Are you having pain?  Yes: NPRS scale: 0/10 Worst: 7/10 Pain location: R medial ankle/foot Pain description: achy  Aggravating factors: walking, standing Relieving factors: rest  PRECAUTIONS: None  RED FLAGS: None   WEIGHT BEARING RESTRICTIONS: WBAT R LE  FALLS:  Has patient fallen in last 6 months? No  LIVING ENVIRONMENT: Lives with: lives with their family Lives in: House/apartment Stairs: Yes: Internal: 12 steps; on right going up Has following equipment at home: Single point cane  OCCUPATION: On disability  PLOF: Independent with basic ADLs  PATIENT GOALS: pt would like to get back to playing sports with friends for fun, decrease her foot pain  NEXT MD VISIT: 02/17/2023  OBJECTIVE:  Note: Objective measures were completed at Evaluation unless otherwise noted.  DIAGNOSTIC FINDINGS: See imaging  PATIENT SURVEYS:  FOTO: 50% function; 69% predicted  COGNITION: Overall cognitive status: Within functional limits for tasks assessed     SENSATION: WFL  POSTURE: rounded shoulders, forward head, and healing  incisional scar on R medial foot  PALPATION: No overt TTP noted  LOWER EXTREMITY ROM:  Active ROM Right eval Left eval  Hip flexion    Hip extension    Hip abduction    Hip adduction    Hip internal rotation    Hip external rotation    Knee flexion    Knee extension    Ankle dorsiflexion Lacking 5 10  Ankle plantarflexion    Ankle inversion    Ankle eversion     (Blank rows = not tested)  LOWER EXTREMITY MMT:  MMT Right eval Left eval  Hip flexion    Hip extension    Hip abduction    Hip adduction    Hip internal rotation    Hip external rotation    Knee flexion    Knee extension    Ankle dorsiflexion 3+/5 5/5  Ankle plantarflexion 3+/5 5/5  Ankle inversion 3+/5 5/5  Ankle eversion 3+/5 5/5   (Blank rows = not tested)  LOWER EXTREMITY SPECIAL TESTS:  DNT  FUNCTIONAL TESTS:  30 Second Sit to Stand: 8 reps  GAIT: Distance walked: 61ft Assistive device utilized: Single point cane Level of assistance: Complete Independence Comments: antalgic    TREATMENT: [facility] Adult PT Treatment:                                                DATE: 02/21/23 Therapeutic Exercise: Seated heel/toe raises x20 L4 BAPS PF/DF x15 cues for control and positioning L4 ankle eversion/inversion x15 AP rockerboard 2x12 barefoot w/ cues for reduced toe compensations Seated isolated hallux extension x15 cues for reduced compensations Seated TKE into ball 3x8 cues for quad contraction and foot positioning  STS 2x10 raised mat exaggerated weight shift to R on first set to improve symmetry of WB Slantboard rocking gastroc stretch 2x8 cues for comfortable ROM  Slantboard rocking soleus stretch 2x8 cues for comfortable ROM  [facility] Adult PT Treatment:                                                DATE: 02/17/23 Therapeutic Exercise: Seated heel/toe raises 2x15 Standing soleus stretch, gentle rocking x8 cues for setup, wall support Standing gastroc stretch gentle rocking x8 cues for  positioning  Seated red band PF x15 cues for positioning Seated red band DF 2x8 PT assisted cues for control Staggered stance weight shifts BIL 2x8 w/ UE support cues for mechanics Seated toe extensions x10 cues for tripod foot Seated hallux ext 2x10 cues for tripod  L3 BAPS DF/PF x10 L3 BAPS circles x10 CW/CCW     [facility] Adult PT Treatment:                                                DATE: 02/08/23 Therapeutic Exercise: Seated toe flexion and extensions Seated heel and toe raises Seated ankle pumps/ circles Seated calf stretch with towel 30 sec x 3  Standing heel and toe raises Tandem stance 20 RLE SLS 4 sec R, 15 sec L Runners stretch rocking for DF mob Manual Therapy: Right ankle mobs  PROM right ankle   Therapeutic Activity: Gait cues for equal step and heel strike    [facility] Adult PT Treatment:                                                DATE: 01/25/2023 Therapeutic Exercise: Tandem stance x 30" R Calf stretch with towel x 30" R Towel scrunch x 30" R Seated heel-toe raises x 10  PATIENT EDUCATION:  Education details: rationale for interventions, HEP  Person educated: Patient Education method: Explanation, Demonstration, Tactile cues, Verbal cues Education comprehension: verbalized understanding, returned demonstration, verbal cues required, tactile cues required, and needs further education     HOME EXERCISE PROGRAM: Access Code: [email or web address] URL: [email or web address] Date: 01/25/2023 Prepared by: Edwinna Areola  Exercises - Standing Tandem Balance with Counter Support  - 1 x daily - 7 x weekly - 3 sets - 30 sec hold - Long Sitting Calf Stretch with Strap  - 1 x daily - 7 x  weekly - 3 reps - 30 sec hold - Towel Scrunches  - 1 x daily - 7 x weekly - 2 reps - 60 sec hold - Seated Heel Toe Raises  - 1 x daily - 7 x weekly - 3 sets - 20 reps  - Heel Toe Raises with Counter Support  - 1 x daily - 7 x weekly - 3 sets - 10 reps - Single Leg Stance  - 1  x daily - 7 x weekly - 1 sets - 10 reps - Sit to stand with control  - 1 x daily - 7 x weekly - 1 sets - 10 reps - Standing Gastroc Stretch at Counter  - 1 x daily - 7 x weekly - 1 sets - 10 reps - 5 hold  ASSESSMENT:  CLINICAL IMPRESSION: 02/21/2023 Pt arrives w/o pain, no issues after last session. Today continuing to work on ankle/foot mobility which pt does well with, denies any pain. Also progressing for increased time in closed chain which pt tolerates well. No pain or adverse events. Recommend continuing along current POC in order to address relevant deficits and improve functional tolerance. Pt departs today's session in no acute distress, all voiced questions/concerns addressed appropriately from PT perspective.    EVAL: Patient is a 22 y.o. F who was seen today for physical therapy evaluation and treatment for s/p R foot surgery on 12/24/2022 for middle facet coalition resection. Physical findings are consistent with surgery and recovery timeline as pt demonstrates decrease in R ankle strength, ROM, and balance. FOTO score demonstrates decrease in subjective functional ability below PLOF. Pt would benefit from skilled PT services working on normalizing gait, ankle strength and balance post surgery.   OBJECTIVE IMPAIRMENTS: Abnormal gait, decreased activity tolerance, decreased balance, decreased mobility, difficulty walking, decreased ROM, decreased strength, and pain  ACTIVITY LIMITATIONS: carrying, lifting, standing, squatting, stairs, transfers, and locomotion level  PARTICIPATION LIMITATIONS: meal prep, cleaning, driving, shopping, occupation, and yard work  PERSONAL FACTORS: 1-2 comorbidities: DM II, White Sutton syndrome  are also affecting patient's functional outcome.   REHAB POTENTIAL: Excellent  CLINICAL DECISION MAKING: Stable/uncomplicated  EVALUATION COMPLEXITY: Low   GOALS: Goals reviewed with patient? No  SHORT TERM GOALS: Target date: 02/15/2023   Pt will be  compliant and knowledgeable with initial HEP for improved comfort and carryover Baseline: initial HEP given  02/08/23: requires mod cues Goal status: ONGOING  2.  Pt will self report R ankle/foot pain no greater than 5/10 for improved comfort and functional ability Baseline: 7/10 at worst Goal status: ONGOING   LONG TERM GOALS: Target date: 04/19/2023   Pt will improve FOTO function score to no less than 69% as proxy for functional improvement Baseline: 50% function Goal status: INITIAL   2.  Pt will self report R foot/ankle pain no greater than 2/10 for improved comfort and functional ability Baseline: 7/10 at worst Goal status: INITIAL   3.  Pt will increase 30 Second Sit to Stand rep count to no less than 12 reps for improved balance, strength, and functional mobility Baseline: 8 reps  Goal status: INITIAL   4.  Pt will improve R ankle DF AROM to no less 10 degrees for improved gait and decrease pain Baseline: lacking 5 Goal status: INITIAL  5.  Pt will be able be to get back to playing sports with friends not limited by R ankle/foot pain  Baseline: unable Goal status: INITIAL   PLAN:  PT FREQUENCY: 2x/week  PT DURATION: 8  weeks  PLANNED INTERVENTIONS: 97164- PT Re-evaluation, 97110-Therapeutic exercises, 97530- Therapeutic activity, 97112- Neuromuscular re-education, 97535- Self Care, 16109- Manual therapy, [id number]- Gait training, 97014- Electrical stimulation (unattended), [id number]- Electrical stimulation (manual), 97016- Vasopneumatic device, Dry Needling, Cryotherapy, and Moist heat  PLAN FOR NEXT SESSION: review/update HEP PRN, R ankle strength and balance, calf stretching    [place] PT, DPT 02/21/2023 4:59 PM

## 2023-02-18 NOTE — Telephone Encounter (Signed)
 Results abstracted and Care Team updated

## 2023-02-21 ENCOUNTER — Encounter: Payer: Self-pay | Admitting: Physical Therapy

## 2023-02-21 ENCOUNTER — Ambulatory Visit: Payer: 59 | Admitting: Physical Therapy

## 2023-02-21 DIAGNOSIS — R2689 Other abnormalities of gait and mobility: Secondary | ICD-10-CM

## 2023-02-21 DIAGNOSIS — M25571 Pain in right ankle and joints of right foot: Secondary | ICD-10-CM | POA: Diagnosis not present

## 2023-02-21 DIAGNOSIS — M6281 Muscle weakness (generalized): Secondary | ICD-10-CM | POA: Diagnosis not present

## 2023-02-24 ENCOUNTER — Encounter: Payer: Self-pay | Admitting: Physical Therapy

## 2023-02-24 ENCOUNTER — Ambulatory Visit: Payer: 59 | Admitting: Physical Therapy

## 2023-02-24 DIAGNOSIS — R2689 Other abnormalities of gait and mobility: Secondary | ICD-10-CM

## 2023-02-24 DIAGNOSIS — M25571 Pain in right ankle and joints of right foot: Secondary | ICD-10-CM

## 2023-02-24 DIAGNOSIS — M6281 Muscle weakness (generalized): Secondary | ICD-10-CM

## 2023-02-24 NOTE — Therapy (Signed)
OUTPATIENT PHYSICAL THERAPY LOWER EXTREMITY TREATMENT   Patient Name: Deanna Levine MRN: [id number] DOB:2000/07/04, 22 y.o., female Today's Date: 02/24/2023  END OF SESSION:  PT End of Session - 02/24/23 1100     Visit Number 5    Number of Visits 24    Date for PT Re-Evaluation 04/19/23    Authorization Type [employer] Dual Complete    PT Start Time 1100    PT Stop Time 1139    PT Time Calculation (min) 39 min    Activity Tolerance Patient tolerated treatment well                Past Medical History:  Diagnosis Date   Absence seizure disorder (HCC)    Allergy    Colitis 07/16/2021   Complication of anesthesia    slow to wake up from anesthesia, occ HA with anesthesia    Diabetes mellitus without complication (HCC)    type 2   Difficulty swallowing pills    Dyspraxia    Eczema    Global developmental delay    Obesity (BMI 30-39.9)    PONV (postoperative nausea and vomiting)    Precocious female puberty    Seizures (HCC)    last seizure 2012 - no current med   Sleep apnea    White-Sutton syndrome    Past Surgical History:  Procedure Laterality Date   ADENOIDECTOMY     BALLOON DILATION N/A 11/23/2021   Procedure: BALLOON DILATION;  Surgeon: Tressia Danas, MD;  Location: [facility];  Service: Gastroenterology;  Laterality: N/A;   BIOPSY  07/16/2021   Procedure: BIOPSY;  Surgeon: Tressia Danas, MD;  Location: [facility] ENDOSCOPY;  Service: Gastroenterology;;   BIOPSY  11/23/2021   Procedure: BIOPSY;  Surgeon: Tressia Danas, MD;  Location: [facility];  Service: Gastroenterology;;   bone removal Left 01/16/2021   foot   COLONOSCOPY WITH PROPOFOL N/A 07/16/2021   Procedure: COLONOSCOPY WITH PROPOFOL;  Surgeon: Tressia Danas, MD;  Location: [facility] ENDOSCOPY;  Service: Gastroenterology;  Laterality: N/A;   ESOPHAGOGASTRODUODENOSCOPY (EGD) WITH PROPOFOL N/A 11/23/2021   Procedure: ESOPHAGOGASTRODUODENOSCOPY (EGD) WITH PROPOFOL;  Surgeon: Tressia Danas,  MD;  Location: [facility];  Service: Gastroenterology;  Laterality: N/A;   FOOT SURGERY Right 11/2022   GASTROCNEMIUS RECESSION Bilateral 04/25/2020   Procedure: BILATERAL GASTROCNEMIUS RECESSION;  Surgeon: Nadara Mustard, MD;  Location: [facility] OR;  Service: Orthopedics;  Laterality: Bilateral;   SUPPRELIN IMPLANT  05/06/2011   Procedure: SUPPRELIN IMPLANT;  Surgeon: Judie Petit. Leonia Corona, MD;  Location: [facility];  Service: Pediatrics;  Laterality: Right;   SUPPRELIN IMPLANT Right 06/14/2013   Procedure: REMOVAL OF SUPPRELIN IMPLANT FROM RIGHT UPPER ARM;  Surgeon: Judie Petit. Leonia Corona, MD;  Location: [facility];  Service: Pediatrics;  Laterality: Right;   TONSILLECTOMY     TONSILLECTOMY AND ADENOIDECTOMY     TYMPANOSTOMY TUBE PLACEMENT     x 2   Patient Active Problem List   Diagnosis Date Noted   Hyperhidrosis 12/30/2021   Dysphagia    OSA (obstructive sleep apnea) 09/30/2021   Bilateral hand numbness 09/15/2021   Acne vulgaris 08/26/2021   Noninfectious gastroenteritis    Bilateral arm pain 06/12/2021   DUB (dysfunctional uterine bleeding) 05/14/2021   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   Other allergic rhinitis 08/20/2020   Allergic conjunctivitis of both eyes 08/20/2020   Encounter for general adult medical examination with abnormal findings 07/03/2020   Leg length discrepancy 06/05/2020   Hidradenitis suppurativa 06/05/2020   Achilles tendon contracture, bilateral  Elevated sedimentation rate 02/07/2020   Pain in right ankle and joints of right foot 02/07/2020   Anemia 12/29/2018   Type 2 diabetes mellitus (HCC) 08/15/2017   Elevated triglycerides with high cholesterol 12/09/2016   Mild intellectual disability 08/16/2012   Laxity of ligament 08/16/2012   Delayed milestones 08/16/2012   Obesity 06/07/2011   Global developmental delay    Petit mal without grand mal seizures (HCC)    Dyspraxia     PCP: Myrlene Broker, MD  REFERRING  PROVIDER: Edwin Cap, DPM   REFERRING DIAG: [id number] (ICD-10-CM) - Coalition, calcaneal tarsal   THERAPY DIAG:  Pain in right ankle and joints of right foot  Muscle weakness (generalized)  Other abnormalities of gait and mobility  Rationale for Evaluation and Treatment: Rehabilitation  ONSET DATE: 12/24/2022  SUBJECTIVE:   SUBJECTIVE STATEMENT: 02/24/2023 Pt reports ~5/10 pain this morning, which she attributes to walking more yesterday. Did HEP without issue, no pain/soreness after last session. No other new updates  Per eval - Pt presents to PT s/p R foot surgery on 12/24/2022 for middle facet coalition resection. Notes pain in R medial foot but otherwise has been doing well ambulating with SPC and boot on R foot. Has been cleared for WBAT outside of boot. Has some occasional tingling in R foot but otherwise no paresthesias.   PERTINENT HISTORY: DM II, White Sutton syndrome  PAIN:  Are you having pain?  Yes: NPRS scale: 0/10 Worst: 7/10 Pain location: R medial ankle/foot Pain description: achy  Aggravating factors: walking, standing Relieving factors: rest  PRECAUTIONS: None  RED FLAGS: None   WEIGHT BEARING RESTRICTIONS: WBAT R LE  FALLS:  Has patient fallen in last 6 months? No  LIVING ENVIRONMENT: Lives with: lives with their family Lives in: House/apartment Stairs: Yes: Internal: 12 steps; on right going up Has following equipment at home: Single point cane  OCCUPATION: On disability  PLOF: Independent with basic ADLs  PATIENT GOALS: pt would like to get back to playing sports with friends for fun, decrease her foot pain  NEXT MD VISIT: PRN  OBJECTIVE:  Note: Objective measures were completed at Evaluation unless otherwise noted.  DIAGNOSTIC FINDINGS: See imaging  PATIENT SURVEYS:  FOTO: 50% function; 69% predicted  COGNITION: Overall cognitive status: Within functional limits for tasks assessed     SENSATION: WFL  POSTURE: rounded  shoulders, forward head, and healing incisional scar on R medial foot  PALPATION: No overt TTP noted  LOWER EXTREMITY ROM:  Active ROM Right eval Left eval  Hip flexion    Hip extension    Hip abduction    Hip adduction    Hip internal rotation    Hip external rotation    Knee flexion    Knee extension    Ankle dorsiflexion Lacking 5 10  Ankle plantarflexion    Ankle inversion    Ankle eversion     (Blank rows = not tested)  LOWER EXTREMITY MMT:  MMT Right eval Left eval  Hip flexion    Hip extension    Hip abduction    Hip adduction    Hip internal rotation    Hip external rotation    Knee flexion    Knee extension    Ankle dorsiflexion 3+/5 5/5  Ankle plantarflexion 3+/5 5/5  Ankle inversion 3+/5 5/5  Ankle eversion 3+/5 5/5   (Blank rows = not tested)  LOWER EXTREMITY SPECIAL TESTS:  DNT  FUNCTIONAL TESTS:  30 Second Sit to Stand:  8 reps    GAIT: Distance walked: 24ft Assistive device utilized: Single point cane Level of assistance: Complete Independence Comments: antalgic    TREATMENT: [facility] Adult PT Treatment:                                                DATE: 02/24/23 Therapeutic Exercise: Nu step L5 LE only 5 min during subjective Standing heel raises 2x10 weaning UE support  AP rockerboard 2x15 barefoot, cues for toe mechanics Seated isolated hallux ext x20 cues to reduce smaller toe compensations Seated L4 BAPS eversion/inversion x20 cues for reduced knee compensations Towel scrunch seated x20 HEP update + education/handout  Therapeutic Activity: Staggered stance weight shifts w/ UE support 2x12 BIL cues for mechanics, pacing, and BOS BW squat w/ UE support at CC 2x10 cues for comfortable ROM and mechanics    [facility] Adult PT Treatment:                                                DATE: 02/21/23 Therapeutic Exercise: Seated heel/toe raises x20 L4 BAPS PF/DF x15 cues for control and positioning L4 ankle eversion/inversion x15 AP  rockerboard 2x12 barefoot w/ cues for reduced toe compensations Seated isolated hallux extension x15 cues for reduced compensations Seated TKE into ball 3x8 cues for quad contraction and foot positioning  STS 2x10 raised mat exaggerated weight shift to R on first set to improve symmetry of WB Slantboard rocking gastroc stretch 2x8 cues for comfortable ROM  Slantboard rocking soleus stretch 2x8 cues for comfortable ROM  [facility] Adult PT Treatment:                                                DATE: 02/17/23 Therapeutic Exercise: Seated heel/toe raises 2x15 Standing soleus stretch, gentle rocking x8 cues for setup, wall support Standing gastroc stretch gentle rocking x8 cues for positioning  Seated red band PF x15 cues for positioning Seated red band DF 2x8 PT assisted cues for control Staggered stance weight shifts BIL 2x8 w/ UE support cues for mechanics Seated toe extensions x10 cues for tripod foot Seated hallux ext 2x10 cues for tripod  L3 BAPS DF/PF x10 L3 BAPS circles x10 CW/CCW     [facility] Adult PT Treatment:                                                DATE: 02/08/23 Therapeutic Exercise: Seated toe flexion and extensions Seated heel and toe raises Seated ankle pumps/ circles Seated calf stretch with towel 30 sec x 3  Standing heel and toe raises Tandem stance 20 RLE SLS 4 sec R, 15 sec L Runners stretch rocking for DF mob Manual Therapy: Right ankle mobs  PROM right ankle   Therapeutic Activity: Gait cues for equal step and heel strike   PATIENT EDUCATION:  Education details: rationale for interventions, HEP  Person educated: Patient Education method: Explanation, Demonstration, Actor cues, Verbal cues Education comprehension: verbalized understanding,  returned demonstration, verbal cues required, tactile cues required, and needs further education     HOME EXERCISE PROGRAM: Access Code: [email or web address] URL: [email or web address] Date:  02/24/2023 Prepared by: Fransisco Hertz  Exercises - Standing Tandem Balance with Counter Support  - 1 x daily - 7 x weekly - 3 sets - 30 sec hold - Long Sitting Calf Stretch with Strap  - 1 x daily - 7 x weekly - 3 reps - 30 sec hold - Towel Scrunches  - 2-3 x daily - 7 x weekly - 15 reps - Heel Toe Raises with Counter Support  - 2-3 x daily - 7 x weekly - 1 sets - 10 reps - Sit to stand with control  - 1 x daily - 7 x weekly - 1 sets - 10 reps - Standing Gastroc Stretch at Counter  - 1 x daily - 7 x weekly - 1 sets - 10 reps - 5 hold - Staggered Stance Forward Backward Weight Shift with Counter Support  - 2-3 x daily - 7 x weekly - 1 sets - 8 reps  ASSESSMENT:  CLINICAL IMPRESSION: 02/24/2023 Pt arrives w/ 5/10 pain after increased walking yesterday, no issues after last session. Does have a bit more irritability with closed chain activity given increased pain on arrival, but this improves w/ open chain work afterwards. No adverse events, pt denies any change in pain on departure. Recommend continuing along current POC in order to address relevant deficits and improve functional tolerance. Pt departs today's session in no acute distress, all voiced questions/concerns addressed appropriately from PT perspective.     EVAL: Patient is a 22 y.o. F who was seen today for physical therapy evaluation and treatment for s/p R foot surgery on 12/24/2022 for middle facet coalition resection. Physical findings are consistent with surgery and recovery timeline as pt demonstrates decrease in R ankle strength, ROM, and balance. FOTO score demonstrates decrease in subjective functional ability below PLOF. Pt would benefit from skilled PT services working on normalizing gait, ankle strength and balance post surgery.   OBJECTIVE IMPAIRMENTS: Abnormal gait, decreased activity tolerance, decreased balance, decreased mobility, difficulty walking, decreased ROM, decreased strength, and pain  ACTIVITY LIMITATIONS:  carrying, lifting, standing, squatting, stairs, transfers, and locomotion level  PARTICIPATION LIMITATIONS: meal prep, cleaning, driving, shopping, occupation, and yard work  PERSONAL FACTORS: 1-2 comorbidities: DM II, White Sutton syndrome  are also affecting patient's functional outcome.   REHAB POTENTIAL: Excellent  CLINICAL DECISION MAKING: Stable/uncomplicated  EVALUATION COMPLEXITY: Low   GOALS: Goals reviewed with patient? No  SHORT TERM GOALS: Target date: 02/15/2023   Pt will be compliant and knowledgeable with initial HEP for improved comfort and carryover Baseline: initial HEP given  02/08/23: requires mod cues Goal status: ONGOING  2.  Pt will self report R ankle/foot pain no greater than 5/10 for improved comfort and functional ability Baseline: 7/10 at worst Goal status: ONGOING   LONG TERM GOALS: Target date: 04/19/2023   Pt will improve FOTO function score to no less than 69% as proxy for functional improvement Baseline: 50% function Goal status: INITIAL   2.  Pt will self report R foot/ankle pain no greater than 2/10 for improved comfort and functional ability Baseline: 7/10 at worst Goal status: INITIAL   3.  Pt will increase 30 Second Sit to Stand rep count to no less than 12 reps for improved balance, strength, and functional mobility Baseline: 8 reps  Goal status: INITIAL   4.  Pt will improve R  ankle DF AROM to no less 10 degrees for improved gait and decrease pain Baseline: lacking 5 Goal status: INITIAL  5.  Pt will be able be to get back to playing sports with friends not limited by R ankle/foot pain  Baseline: unable Goal status: INITIAL   PLAN:  PT FREQUENCY: 2x/week  PT DURATION: 8 weeks  PLANNED INTERVENTIONS: 97164- PT Re-evaluation, 97110-Therapeutic exercises, 97530- Therapeutic activity, 97112- Neuromuscular re-education, 97535- Self Care, 16109- Manual therapy, [id number]- Gait training, 97014- Electrical stimulation (unattended),  [id number]- Electrical stimulation (manual), 97016- Vasopneumatic device, Dry Needling, Cryotherapy, and Moist heat  PLAN FOR NEXT SESSION: review/update HEP PRN, R ankle strength and balance, calf stretching    Mariachristina Murrain PT, DPT 02/24/2023 11:42 AM

## 2023-02-28 ENCOUNTER — Ambulatory Visit: Payer: 59 | Admitting: Physical Therapy

## 2023-03-03 ENCOUNTER — Ambulatory Visit: Payer: 59 | Attending: Internal Medicine

## 2023-03-03 DIAGNOSIS — M25571 Pain in right ankle and joints of right foot: Secondary | ICD-10-CM | POA: Insufficient documentation

## 2023-03-03 DIAGNOSIS — M542 Cervicalgia: Secondary | ICD-10-CM | POA: Insufficient documentation

## 2023-03-03 DIAGNOSIS — M6281 Muscle weakness (generalized): Secondary | ICD-10-CM | POA: Insufficient documentation

## 2023-03-03 DIAGNOSIS — R2689 Other abnormalities of gait and mobility: Secondary | ICD-10-CM | POA: Insufficient documentation

## 2023-03-03 NOTE — Therapy (Signed)
 OUTPATIENT PHYSICAL THERAPY LOWER EXTREMITY TREATMENT   Patient Name: Deanna Levine MRN: [id number] DOB:06-14-2000, 23 y.o., female Today's Date: 03/03/2023  END OF SESSION:  PT End of Session - 03/03/23 1006     Visit Number 6    Number of Visits 24    Date for PT Re-Evaluation 04/19/23    Authorization Type [employer] Dual Complete    PT Start Time 1015    PT Stop Time 1053    PT Time Calculation (min) 38 min    Activity Tolerance Patient tolerated treatment well                 Past Medical History:  Diagnosis Date   Absence seizure disorder (HCC)    Allergy     Colitis 07/16/2021   Complication of anesthesia    slow to wake up from anesthesia, occ HA with anesthesia    Diabetes mellitus without complication (HCC)    type 2   Difficulty swallowing pills    Dyspraxia    Eczema    Global developmental delay    Obesity (BMI 30-39.9)    PONV (postoperative nausea and vomiting)    Precocious female puberty    Seizures (HCC)    last seizure 2012 - no current med   Sleep apnea    White-Sutton syndrome    Past Surgical History:  Procedure Laterality Date   ADENOIDECTOMY     BALLOON DILATION N/A 11/23/2021   Procedure: BALLOON DILATION;  Surgeon: Eda Iha, MD;  Location: [facility] ENDOSCOPY;  Service: Gastroenterology;  Laterality: N/A;   BIOPSY  07/16/2021   Procedure: BIOPSY;  Surgeon: Eda Iha, MD;  Location: [facility] ENDOSCOPY;  Service: Gastroenterology;;   BIOPSY  11/23/2021   Procedure: BIOPSY;  Surgeon: Eda Iha, MD;  Location: [facility];  Service: Gastroenterology;;   bone removal Left 01/16/2021   foot   COLONOSCOPY WITH PROPOFOL  N/A 07/16/2021   Procedure: COLONOSCOPY WITH PROPOFOL ;  Surgeon: Eda Iha, MD;  Location: [facility] ENDOSCOPY;  Service: Gastroenterology;  Laterality: N/A;   ESOPHAGOGASTRODUODENOSCOPY (EGD) WITH PROPOFOL  N/A 11/23/2021   Procedure: ESOPHAGOGASTRODUODENOSCOPY (EGD) WITH PROPOFOL ;  Surgeon: Eda Iha,  MD;  Location: [facility] ENDOS[facility];  Service: Gastroenterology;  Laterality: N/A;   FOOT SURGERY Right 11/2022   GASTROCNEMIUS RECESSION Bilateral 04/25/2020   Procedure: BILATERAL GASTROCNEMIUS RECESSION;  Surgeon: Harden Jerona GAILS, MD;  Location: [facility] OR;  Service: Orthopedics;  Laterality: Bilateral;   SUPPRELIN  IMPLANT  05/06/2011   Procedure: SUPPRELIN  IMPLANT;  Surgeon: CHRISTELLA. Julietta Millman, MD;  Location: [facility];  Service: Pediatrics;  Laterality: Right;   SUPPRELIN  IMPLANT Right 06/14/2013   Procedure: REMOVAL OF SUPPRELIN  IMPLANT FROM RIGHT UPPER ARM;  Surgeon: CHRISTELLA. Julietta Millman, MD;  Location: [facility];  Service: Pediatrics;  Laterality: Right;   TONSILLECTOMY     TONSILLECTOMY AND ADENOIDECTOMY     TYMPANOSTOMY TUBE PLACEMENT     x 2   Patient Active Problem List   Diagnosis Date Noted   Hyperhidrosis 12/30/2021   Dysphagia    OSA (obstructive sleep apnea) 09/30/2021   Bilateral hand numbness 09/15/2021   Acne vulgaris 08/26/2021   Noninfectious gastroenteritis    Bilateral arm pain 06/12/2021   DUB (dysfunctional uterine bleeding) 05/14/2021   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   Other allergic rhinitis 08/20/2020   Allergic conjunctivitis of both eyes 08/20/2020   Encounter for general adult medical examination with abnormal findings 07/03/2020   Leg length discrepancy 06/05/2020   Hidradenitis suppurativa 06/05/2020   Achilles tendon contracture,  bilateral    Elevated sedimentation rate 02/07/2020   Pain in right ankle and joints of right foot 02/07/2020   Anemia 12/29/2018   Type 2 diabetes mellitus (HCC) 08/15/2017   Elevated triglycerides with high cholesterol 12/09/2016   Mild intellectual disability 08/16/2012   Laxity of ligament 08/16/2012   Delayed milestones 08/16/2012   Obesity 06/07/2011   Global developmental delay    Petit mal without grand mal seizures (HCC)    Dyspraxia     PCP: Rollene [name] LABOR, MD  REFERRING  PROVIDER: Silva Juliene SAUNDERS, DPM   REFERRING DIAG: [phone] (ICD-10-CM) - Coalition, calcaneal tarsal   THERAPY DIAG:  Pain in right ankle and joints of right foot  Muscle weakness (generalized)  Other abnormalities of gait and mobility  Cervicalgia  Rationale for Evaluation and Treatment: Rehabilitation  ONSET DATE: 12/24/2022  SUBJECTIVE:   SUBJECTIVE STATEMENT: 03/03/2023: Pt presetns to PT with continued pain in R foot/ankle. Has been compliant with HEP.   Per eval - Pt presents to PT s/p R foot surgery on 12/24/2022 for middle facet coalition resection. Notes pain in R medial foot but otherwise has been doing well ambulating with SPC and boot on R foot. Has been cleared for WBAT outside of boot. Has some occasional tingling in R foot but otherwise no paresthesias.   PERTINENT HISTORY: DM II, White Sutton syndrome  PAIN:  Are you having pain?  Yes: NPRS scale: 6/10 Worst: 7/10 Pain location: R medial ankle/foot Pain description: achy  Aggravating factors: walking, standing Relieving factors: rest  PRECAUTIONS: None  RED FLAGS: None   WEIGHT BEARING RESTRICTIONS: WBAT R LE  FALLS:  Has patient fallen in last 6 months? No  LIVING ENVIRONMENT: Lives with: lives with their family Lives in: House/apartment Stairs: Yes: Internal: 12 steps; on right going up Has following equipment at home: Single point cane  OCCUPATION: On disability  PLOF: Independent with basic ADLs  PATIENT GOALS: pt would like to get back to playing sports with friends for fun, decrease her foot pain  NEXT MD VISIT: PRN  OBJECTIVE:  Note: Objective measures were completed at Evaluation unless otherwise noted.  DIAGNOSTIC FINDINGS: See imaging  PATIENT SURVEYS:  FOTO: 50% function; 69% predicted  COGNITION: Overall cognitive status: Within functional limits for tasks assessed     SENSATION: WFL  POSTURE: rounded shoulders, forward head, and healing incisional scar on R medial  foot  PALPATION: No overt TTP noted  LOWER EXTREMITY ROM:  Active ROM Right eval Left eval  Hip flexion    Hip extension    Hip abduction    Hip adduction    Hip internal rotation    Hip external rotation    Knee flexion    Knee extension    Ankle dorsiflexion Lacking 5 10  Ankle plantarflexion    Ankle inversion    Ankle eversion     (Blank rows = not tested)  LOWER EXTREMITY MMT:  MMT Right eval Left eval  Hip flexion    Hip extension    Hip abduction    Hip adduction    Hip internal rotation    Hip external rotation    Knee flexion    Knee extension    Ankle dorsiflexion 3+/5 5/5  Ankle plantarflexion 3+/5 5/5  Ankle inversion 3+/5 5/5  Ankle eversion 3+/5 5/5   (Blank rows = not tested)  LOWER EXTREMITY SPECIAL TESTS:  DNT  FUNCTIONAL TESTS:  30 Second Sit to Stand: 8 reps  GAIT: Distance walked: 46ft Assistive device utilized: Single point cane Level of assistance: Complete Independence Comments: antalgic    TREATMENT: [facility] Adult PT Treatment:                                                DATE: 03/03/2023 Therapeutic Exercise: Nu step L5 LE only 5 min during subjective Slant board calf stretch 2x30 Standing heel raises 2x15 weaning UE support  Standing mini squat bilat UE support 2x10 Towel scrunch 2x60 R R ankle 4-way YTB 2x10 Seated L3 BAPS cw/ccw 2x10 each AP RB seated 2x15 fwd/bwd Tandem stance R back 2x30 FT EC on foam x 30  [facility] Adult PT Treatment:                                                DATE: 02/24/23 Therapeutic Exercise: Nu step L5 LE only 5 min during subjective Standing heel raises 2x10 weaning UE support  AP rockerboard 2x15 barefoot, cues for toe mechanics Seated isolated hallux ext x20 cues to reduce smaller toe compensations Seated L4 BAPS eversion/inversion x20 cues for reduced knee compensations Towel scrunch seated x20 HEP update + education/handout Therapeutic Activity: Staggered stance weight shifts  w/ UE support 2x12 BIL cues for mechanics, pacing, and BOS BW squat w/ UE support at CC 2x10 cues for comfortable ROM and mechanics    [facility] Adult PT Treatment:                                                DATE: 02/21/23 Therapeutic Exercise: Seated heel/toe raises x20 L4 BAPS PF/DF x15 cues for control and positioning L4 ankle eversion/inversion x15 AP rockerboard 2x12 barefoot w/ cues for reduced toe compensations Seated isolated hallux extension x15 cues for reduced compensations Seated TKE into ball 3x8 cues for quad contraction and foot positioning  STS 2x10 raised mat exaggerated weight shift to R on first set to improve symmetry of WB Slantboard rocking gastroc stretch 2x8 cues for comfortable ROM  Slantboard rocking soleus stretch 2x8 cues for comfortable ROM  [facility] Adult PT Treatment:                                                DATE: 02/17/23 Therapeutic Exercise: Seated heel/toe raises 2x15 Standing soleus stretch, gentle rocking x8 cues for setup, wall support Standing gastroc stretch gentle rocking x8 cues for positioning  Seated red band PF x15 cues for positioning Seated red band DF 2x8 PT assisted cues for control Staggered stance weight shifts BIL 2x8 w/ UE support cues for mechanics Seated toe extensions x10 cues for tripod foot Seated hallux ext 2x10 cues for tripod  L3 BAPS DF/PF x10 L3 BAPS circles x10 CW/CCW     [facility] Adult PT Treatment:  DATE: 02/08/23 Therapeutic Exercise: Seated toe flexion and extensions Seated heel and toe raises Seated ankle pumps/ circles Seated calf stretch with towel 30 sec x 3  Standing heel and toe raises Tandem stance 20 RLE SLS 4 sec R, 15 sec L Runners stretch rocking for DF mob Manual Therapy: Right ankle mobs  PROM right ankle   Therapeutic Activity: Gait cues for equal step and heel strike   PATIENT EDUCATION:  Education details: rationale for interventions, HEP   Person educated: Patient Education method: Explanation, Demonstration, Tactile cues, Verbal cues Education comprehension: verbalized understanding, returned demonstration, verbal cues required, tactile cues required, and needs further education     HOME EXERCISE PROGRAM: Access Code: [email or web address] URL: [email or web address] Date: 02/24/2023 Prepared by: Alm Jenny  Exercises - Standing Tandem Balance with Counter Support  - 1 x daily - 7 x weekly - 3 sets - 30 sec hold - Long Sitting Calf Stretch with Strap  - 1 x daily - 7 x weekly - 3 reps - 30 sec hold - Towel Scrunches  - 2-3 x daily - 7 x weekly - 15 reps - Heel Toe Raises with Counter Support  - 2-3 x daily - 7 x weekly - 1 sets - 10 reps - Sit to stand with control  - 1 x daily - 7 x weekly - 1 sets - 10 reps - Standing Gastroc Stretch at Counter  - 1 x daily - 7 x weekly - 1 sets - 10 reps - 5 hold - Staggered Stance Forward Backward Weight Shift with Counter Support  - 2-3 x daily - 7 x weekly - 1 sets - 8 reps  ASSESSMENT:  CLINICAL IMPRESSION: 03/03/2023: Pt was able to complete all prescribed exercises with no adverse effect. Therapy today continued to focus on improving R ankle strength and proprioception. Pt continues to benefit from skilled PT services, will continue to be seen and progressed as able.   EVAL: Patient is a 23 y.o. F who was seen today for physical therapy evaluation and treatment for s/p R foot surgery on 12/24/2022 for middle facet coalition resection. Physical findings are consistent with surgery and recovery timeline as pt demonstrates decrease in R ankle strength, ROM, and balance. FOTO score demonstrates decrease in subjective functional ability below PLOF. Pt would benefit from skilled PT services working on normalizing gait, ankle strength and balance post surgery.   OBJECTIVE IMPAIRMENTS: Abnormal gait, decreased activity tolerance, decreased balance, decreased mobility, difficulty walking,  decreased ROM, decreased strength, and pain  ACTIVITY LIMITATIONS: carrying, lifting, standing, squatting, stairs, transfers, and locomotion level  PARTICIPATION LIMITATIONS: meal prep, cleaning, driving, shopping, occupation, and yard work  PERSONAL FACTORS: 1-2 comorbidities: DM II, White Sutton syndrome  are also affecting patient's functional outcome.   REHAB POTENTIAL: Excellent  CLINICAL DECISION MAKING: Stable/uncomplicated  EVALUATION COMPLEXITY: Low   GOALS: Goals reviewed with patient? No  SHORT TERM GOALS: Target date: 02/15/2023   Pt will be compliant and knowledgeable with initial HEP for improved comfort and carryover Baseline: initial HEP given  02/08/23: requires mod cues Goal status: ONGOING  2.  Pt will self report R ankle/foot pain no greater than 5/10 for improved comfort and functional ability Baseline: 7/10 at worst Goal status: ONGOING   LONG TERM GOALS: Target date: 04/19/2023   Pt will improve FOTO function score to no less than 69% as proxy for functional improvement Baseline: 50% function Goal status: INITIAL   2.  Pt will self report R foot/ankle pain no  greater than 2/10 for improved comfort and functional ability Baseline: 7/10 at worst Goal status: INITIAL   3.  Pt will increase 30 Second Sit to Stand rep count to no less than 12 reps for improved balance, strength, and functional mobility Baseline: 8 reps  Goal status: INITIAL   4.  Pt will improve R ankle DF AROM to no less 10 degrees for improved gait and decrease pain Baseline: lacking 5 Goal status: INITIAL  5.  Pt will be able be to get back to playing sports with friends not limited by R ankle/foot pain  Baseline: unable Goal status: INITIAL   PLAN:  PT FREQUENCY: 2x/week  PT DURATION: 8 weeks  PLANNED INTERVENTIONS: 97164- PT Re-evaluation, 97110-Therapeutic exercises, 97530- Therapeutic activity, 97112- Neuromuscular re-education, 97535- Self Care, [id number]- Manual therapy,  [id number]- Gait training, 97014- Electrical stimulation (unattended), [id number]- Electrical stimulation (manual), 97016- Vasopneumatic device, Dry Needling, Cryotherapy, and Moist heat  PLAN FOR NEXT SESSION: review/update HEP PRN, R ankle strength and balance, calf stretching    Alm JAYSON Kingdom PT  03/03/23 10:58 AM

## 2023-03-07 ENCOUNTER — Ambulatory Visit: Payer: 59

## 2023-03-07 DIAGNOSIS — M25571 Pain in right ankle and joints of right foot: Secondary | ICD-10-CM | POA: Diagnosis not present

## 2023-03-07 DIAGNOSIS — R2689 Other abnormalities of gait and mobility: Secondary | ICD-10-CM

## 2023-03-07 DIAGNOSIS — M6281 Muscle weakness (generalized): Secondary | ICD-10-CM | POA: Diagnosis not present

## 2023-03-07 DIAGNOSIS — M542 Cervicalgia: Secondary | ICD-10-CM | POA: Diagnosis not present

## 2023-03-07 NOTE — Therapy (Signed)
 OUTPATIENT PHYSICAL THERAPY LOWER EXTREMITY TREATMENT   Patient Name: Deanna Levine MRN: [id number] DOB:[birth year], 23 y.o., female Today's Date: 03/07/2023  END OF SESSION:  PT End of Session - 03/07/23 0922     Visit Number 7    Number of Visits 24    Date for PT Re-Evaluation 04/19/23    Authorization Type [employer] Dual Complete    PT Start Time 0930    PT Stop Time 1010    PT Time Calculation (min) 40 min    Activity Tolerance Patient tolerated treatment well                  Past Medical History:  Diagnosis Date   Absence seizure disorder (HCC)    Allergy     Colitis 07/16/2021   Complication of anesthesia    slow to wake up from anesthesia, occ HA with anesthesia    Diabetes mellitus without complication (HCC)    type 2   Difficulty swallowing pills    Dyspraxia    Eczema    Global developmental delay    Obesity (BMI 30-39.9)    PONV (postoperative nausea and vomiting)    Precocious female puberty    Seizures (HCC)    last seizure 2012 - no current med   Sleep apnea    White-Sutton syndrome    Past Surgical History:  Procedure Laterality Date   ADENOIDECTOMY     BALLOON DILATION N/A 11/23/2021   Procedure: BALLOON DILATION;  Surgeon: Eda Iha, MD;  Location: [facility];  Service: Gastroenterology;  Laterality: N/A;   BIOPSY  07/16/2021   Procedure: BIOPSY;  Surgeon: Eda Iha, MD;  Location: [facility] ENDOSCOPY;  Service: Gastroenterology;;   BIOPSY  11/23/2021   Procedure: BIOPSY;  Surgeon: Eda Iha, MD;  Location: [facility];  Service: Gastroenterology;;   bone removal Left 01/16/2021   foot   COLONOSCOPY WITH PROPOFOL  N/A 07/16/2021   Procedure: COLONOSCOPY WITH PROPOFOL ;  Surgeon: Eda Iha, MD;  Location: [facility] ENDOSCOPY;  Service: Gastroenterology;  Laterality: N/A;   ESOPHAGOGASTRODUODENOSCOPY (EGD) WITH PROPOFOL  N/A 11/23/2021   Procedure: ESOPHAGOGASTRODUODENOSCOPY (EGD) WITH PROPOFOL ;  Surgeon: Eda Iha,  MD;  Location: [facility];  Service: Gastroenterology;  Laterality: N/A;   FOOT SURGERY Right 11/2022   GASTROCNEMIUS RECESSION Bilateral 04/25/2020   Procedure: BILATERAL GASTROCNEMIUS RECESSION;  Surgeon: Harden Jerona GAILS, MD;  Location: [facility] OR;  Service: Orthopedics;  Laterality: Bilateral;   SUPPRELIN  IMPLANT  05/06/2011   Procedure: SUPPRELIN  IMPLANT;  Surgeon: CHRISTELLA. Julietta Millman, MD;  Location: [facility];  Service: Pediatrics;  Laterality: Right;   SUPPRELIN  IMPLANT Right 06/14/2013   Procedure: REMOVAL OF SUPPRELIN  IMPLANT FROM RIGHT UPPER ARM;  Surgeon: CHRISTELLA. Julietta Millman, MD;  Location: [facility];  Service: Pediatrics;  Laterality: Right;   TONSILLECTOMY     TONSILLECTOMY AND ADENOIDECTOMY     TYMPANOSTOMY TUBE PLACEMENT     x 2   Patient Active Problem List   Diagnosis Date Noted   Hyperhidrosis 12/30/2021   Dysphagia    OSA (obstructive sleep apnea) 09/30/2021   Bilateral hand numbness 09/15/2021   Acne vulgaris 08/26/2021   Noninfectious gastroenteritis    Bilateral arm pain 06/12/2021   DUB (dysfunctional uterine bleeding) 05/14/2021   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   Other allergic rhinitis 08/20/2020   Allergic conjunctivitis of both eyes 08/20/2020   Encounter for general adult medical examination with abnormal findings 07/03/2020   Leg length discrepancy 06/05/2020   Hidradenitis suppurativa 06/05/2020   Achilles tendon  contracture, bilateral    Elevated sedimentation rate 02/07/2020   Pain in right ankle and joints of right foot 02/07/2020   Anemia 12/29/2018   Type 2 diabetes mellitus (HCC) 08/15/2017   Elevated triglycerides with high cholesterol 12/09/2016   Mild intellectual disability 08/16/2012   Laxity of ligament 08/16/2012   Delayed milestones 08/16/2012   Obesity 06/07/2011   Global developmental delay    Petit mal without grand mal seizures (HCC)    Dyspraxia     PCP: Rollene [name] LABOR, MD  REFERRING  PROVIDER: Silva Juliene SAUNDERS, DPM   REFERRING DIAG: [phone] (ICD-10-CM) - Coalition, calcaneal tarsal   THERAPY DIAG:  Pain in right ankle and joints of right foot  Muscle weakness (generalized)  Other abnormalities of gait and mobility  Rationale for Evaluation and Treatment: Rehabilitation  ONSET DATE: 12/24/2022  SUBJECTIVE:   SUBJECTIVE STATEMENT: 03/07/2023: Pt presents to PT with reports of continued pain in R ankle. Pt has continued HEP compliance.   Per eval - Pt presents to PT s/p R foot surgery on 12/24/2022 for middle facet coalition resection. Notes pain in R medial foot but otherwise has been doing well ambulating with SPC and boot on R foot. Has been cleared for WBAT outside of boot. Has some occasional tingling in R foot but otherwise no paresthesias.   PERTINENT HISTORY: DM II, White Sutton syndrome  PAIN:  Are you having pain?  Yes: NPRS scale: 6/10 Worst: 7/10 Pain location: R medial ankle/foot Pain description: achy  Aggravating factors: walking, standing Relieving factors: rest  PRECAUTIONS: None  RED FLAGS: None   WEIGHT BEARING RESTRICTIONS: WBAT R LE  FALLS:  Has patient fallen in last 6 months? No  LIVING ENVIRONMENT: Lives with: lives with their family Lives in: House/apartment Stairs: Yes: Internal: 12 steps; on right going up Has following equipment at home: Single point cane  OCCUPATION: On disability  PLOF: Independent with basic ADLs  PATIENT GOALS: pt would like to get back to playing sports with friends for fun, decrease her foot pain  NEXT MD VISIT: PRN  OBJECTIVE:  Note: Objective measures were completed at Evaluation unless otherwise noted.  DIAGNOSTIC FINDINGS: See imaging  PATIENT SURVEYS:  FOTO: 50% function; 69% predicted  COGNITION: Overall cognitive status: Within functional limits for tasks assessed     SENSATION: WFL  POSTURE: rounded shoulders, forward head, and healing incisional scar on R medial  foot  PALPATION: No overt TTP noted  LOWER EXTREMITY ROM:  Active ROM Right eval Left eval  Hip flexion    Hip extension    Hip abduction    Hip adduction    Hip internal rotation    Hip external rotation    Knee flexion    Knee extension    Ankle dorsiflexion Lacking 5 10  Ankle plantarflexion    Ankle inversion    Ankle eversion     (Blank rows = not tested)  LOWER EXTREMITY MMT:  MMT Right eval Left eval  Hip flexion    Hip extension    Hip abduction    Hip adduction    Hip internal rotation    Hip external rotation    Knee flexion    Knee extension    Ankle dorsiflexion 3+/5 5/5  Ankle plantarflexion 3+/5 5/5  Ankle inversion 3+/5 5/5  Ankle eversion 3+/5 5/5   (Blank rows = not tested)  LOWER EXTREMITY SPECIAL TESTS:  DNT  FUNCTIONAL TESTS:  30 Second Sit to Stand: 8 reps  GAIT: Distance walked: 37ft Assistive device utilized: Single point cane Level of assistance: Complete Independence Comments: antalgic    TREATMENT: [facility] Adult PT Treatment:                                                DATE: 03/07/2023 Therapeutic Exercise: Nu step L5 LE only 5 min during subjective Slant board calf stretch 2x45 Standing heel raises 2x15 weaning UE support  Standing mini squat bilat UE support 2x10 FT of foam x 30 Tandem on foam 2x30 R back  Towel scrunch 2x60 R R ankle 4-way YTB x 10 Seated L3 BAPS cw/ccw 2x10 each AP RB seated 2x60 fwd/bwd STS R foot back 2x10 - no UE L TKE with ball 2x10  R LE  [facility] Adult PT Treatment:                                                DATE: 03/03/2023 Therapeutic Exercise: Nu step L5 LE only 5 min during subjective Slant board calf stretch 2x30 Standing heel raises 2x15 weaning UE support  Standing mini squat bilat UE support 2x10 Towel scrunch 2x60 R R ankle 4-way YTB 2x10 Seated L3 BAPS cw/ccw 2x10 each AP RB seated 2x15 fwd/bwd Tandem stance R back 2x30 FT EC on foam x 30  [facility] Adult PT  Treatment:                                                DATE: 02/24/23 Therapeutic Exercise: Nu step L5 LE only 5 min during subjective Standing heel raises 2x10 weaning UE support  AP rockerboard 2x15 barefoot, cues for toe mechanics Seated isolated hallux ext x20 cues to reduce smaller toe compensations Seated L4 BAPS eversion/inversion x20 cues for reduced knee compensations Towel scrunch seated x20 HEP update + education/handout Therapeutic Activity: Staggered stance weight shifts w/ UE support 2x12 BIL cues for mechanics, pacing, and BOS BW squat w/ UE support at CC 2x10 cues for comfortable ROM and mechanics    [facility] Adult PT Treatment:                                                DATE: 02/21/23 Therapeutic Exercise: Seated heel/toe raises x20 L4 BAPS PF/DF x15 cues for control and positioning L4 ankle eversion/inversion x15 AP rockerboard 2x12 barefoot w/ cues for reduced toe compensations Seated isolated hallux extension x15 cues for reduced compensations Seated TKE into ball 3x8 cues for quad contraction and foot positioning  STS 2x10 raised mat exaggerated weight shift to R on first set to improve symmetry of WB Slantboard rocking gastroc stretch 2x8 cues for comfortable ROM  Slantboard rocking soleus stretch 2x8 cues for comfortable ROM  [facility] Adult PT Treatment:  DATE: 02/17/23 Therapeutic Exercise: Seated heel/toe raises 2x15 Standing soleus stretch, gentle rocking x8 cues for setup, wall support Standing gastroc stretch gentle rocking x8 cues for positioning  Seated red band PF x15 cues for positioning Seated red band DF 2x8 PT assisted cues for control Staggered stance weight shifts BIL 2x8 w/ UE support cues for mechanics Seated toe extensions x10 cues for tripod foot Seated hallux ext 2x10 cues for tripod  L3 BAPS DF/PF x10 L3 BAPS circles x10 CW/CCW     [facility] Adult PT Treatment:                                                 DATE: 02/08/23 Therapeutic Exercise: Seated toe flexion and extensions Seated heel and toe raises Seated ankle pumps/ circles Seated calf stretch with towel 30 sec x 3  Standing heel and toe raises Tandem stance 20 RLE SLS 4 sec R, 15 sec L Runners stretch rocking for DF mob Manual Therapy: Right ankle mobs  PROM right ankle   Therapeutic Activity: Gait cues for equal step and heel strike   PATIENT EDUCATION:  Education details: rationale for interventions, HEP  Person educated: Patient Education method: Explanation, Demonstration, Tactile cues, Verbal cues Education comprehension: verbalized understanding, returned demonstration, verbal cues required, tactile cues required, and needs further education     HOME EXERCISE PROGRAM: Access Code: [email or web address] URL: [email or web address] Date: 02/24/2023 Prepared by: Alm Jenny  Exercises - Standing Tandem Balance with Counter Support  - 1 x daily - 7 x weekly - 3 sets - 30 sec hold - Long Sitting Calf Stretch with Strap  - 1 x daily - 7 x weekly - 3 reps - 30 sec hold - Towel Scrunches  - 2-3 x daily - 7 x weekly - 15 reps - Heel Toe Raises with Counter Support  - 2-3 x daily - 7 x weekly - 1 sets - 10 reps - Sit to stand with control  - 1 x daily - 7 x weekly - 1 sets - 10 reps - Standing Gastroc Stretch at Counter  - 1 x daily - 7 x weekly - 1 sets - 10 reps - 5 hold - Staggered Stance Forward Backward Weight Shift with Counter Support  - 2-3 x daily - 7 x weekly - 1 sets - 8 reps  ASSESSMENT:  CLINICAL IMPRESSION: 03/07/2023: Pt was able to complete all prescribed exercises with no adverse effect or increase in pain. Therapy today also focused on improving L ankle strength, proprioception, and mobility. Patient continues to benefit from skilled PT services, will continue to progress as tolerated per POC.   EVAL: Patient is a 23 y.o. F who was seen today for physical therapy evaluation and treatment  for s/p R foot surgery on 12/24/2022 for middle facet coalition resection. Physical findings are consistent with surgery and recovery timeline as pt demonstrates decrease in R ankle strength, ROM, and balance. FOTO score demonstrates decrease in subjective functional ability below PLOF. Pt would benefit from skilled PT services working on normalizing gait, ankle strength and balance post surgery.   OBJECTIVE IMPAIRMENTS: Abnormal gait, decreased activity tolerance, decreased balance, decreased mobility, difficulty walking, decreased ROM, decreased strength, and pain  ACTIVITY LIMITATIONS: carrying, lifting, standing, squatting, stairs, transfers, and locomotion level  PARTICIPATION LIMITATIONS: meal prep, cleaning, driving, shopping, occupation, and yard work  PERSONAL FACTORS:  1-2 comorbidities: DM II, White Sutton syndrome  are also affecting patient's functional outcome.   REHAB POTENTIAL: Excellent  CLINICAL DECISION MAKING: Stable/uncomplicated  EVALUATION COMPLEXITY: Low   GOALS: Goals reviewed with patient? No  SHORT TERM GOALS: Target date: 02/15/2023   Pt will be compliant and knowledgeable with initial HEP for improved comfort and carryover Baseline: initial HEP given  02/08/23: requires mod cues Goal status: ONGOING  2.  Pt will self report R ankle/foot pain no greater than 5/10 for improved comfort and functional ability Baseline: 7/10 at worst Goal status: ONGOING   LONG TERM GOALS: Target date: 04/19/2023   Pt will improve FOTO function score to no less than 69% as proxy for functional improvement Baseline: 50% function Goal status: INITIAL   2.  Pt will self report R foot/ankle pain no greater than 2/10 for improved comfort and functional ability Baseline: 7/10 at worst Goal status: INITIAL   3.  Pt will increase 30 Second Sit to Stand rep count to no less than 12 reps for improved balance, strength, and functional mobility Baseline: 8 reps  Goal status:  INITIAL   4.  Pt will improve R ankle DF AROM to no less 10 degrees for improved gait and decrease pain Baseline: lacking 5 Goal status: INITIAL  5.  Pt will be able be to get back to playing sports with friends not limited by R ankle/foot pain  Baseline: unable Goal status: INITIAL   PLAN:  PT FREQUENCY: 2x/week  PT DURATION: 8 weeks  PLANNED INTERVENTIONS: 97164- PT Re-evaluation, 97110-Therapeutic exercises, 97530- Therapeutic activity, 97112- Neuromuscular re-education, 97535- Self Care, [id number]- Manual therapy, [id number]- Gait training, 97014- Electrical stimulation (unattended), [id number]- Electrical stimulation (manual), 97016- Vasopneumatic device, Dry Needling, Cryotherapy, and Moist heat  PLAN FOR NEXT SESSION: review/update HEP PRN, R ankle strength and balance, calf stretching    Alm JAYSON Kingdom PT  03/07/23 10:15 AM

## 2023-03-10 ENCOUNTER — Ambulatory Visit: Payer: 59

## 2023-03-10 DIAGNOSIS — M6281 Muscle weakness (generalized): Secondary | ICD-10-CM | POA: Diagnosis not present

## 2023-03-10 DIAGNOSIS — M25571 Pain in right ankle and joints of right foot: Secondary | ICD-10-CM | POA: Diagnosis not present

## 2023-03-10 DIAGNOSIS — M542 Cervicalgia: Secondary | ICD-10-CM | POA: Diagnosis not present

## 2023-03-10 DIAGNOSIS — R2689 Other abnormalities of gait and mobility: Secondary | ICD-10-CM | POA: Diagnosis not present

## 2023-03-10 NOTE — Therapy (Signed)
 OUTPATIENT PHYSICAL THERAPY LOWER EXTREMITY TREATMENT   Patient Name: Deanna Levine MRN: [id number] DOB:2001-01-25, 23 y.o., female Today's Date: 03/10/2023  END OF SESSION:  PT End of Session - 03/10/23 0927     Visit Number 8    Number of Visits 24    Date for PT Re-Evaluation 04/19/23    Authorization Type [employer] Dual Complete    PT Start Time 0930    PT Stop Time 1010    PT Time Calculation (min) 40 min    Activity Tolerance Patient tolerated treatment well                   Past Medical History:  Diagnosis Date   Absence seizure disorder (HCC)    Allergy     Colitis 07/16/2021   Complication of anesthesia    slow to wake up from anesthesia, occ HA with anesthesia    Diabetes mellitus without complication (HCC)    type 2   Difficulty swallowing pills    Dyspraxia    Eczema    Global developmental delay    Obesity (BMI 30-39.9)    PONV (postoperative nausea and vomiting)    Precocious female puberty    Seizures (HCC)    last seizure 2012 - no current med   Sleep apnea    White-Sutton syndrome    Past Surgical History:  Procedure Laterality Date   ADENOIDECTOMY     BALLOON DILATION N/A 11/23/2021   Procedure: BALLOON DILATION;  Surgeon: Eda Iha, MD;  Location: [facility];  Service: Gastroenterology;  Laterality: N/A;   BIOPSY  07/16/2021   Procedure: BIOPSY;  Surgeon: Eda Iha, MD;  Location: [facility] END[facility];  Service: Gastroenterology;;   BIOPSY  11/23/2021   Procedure: BIOPSY;  Surgeon: Eda Iha, MD;  Location: [facility];  Service: Gastroenterology;;   bone removal Left 01/16/2021   foot   COLONOSCOPY WITH PROPOFOL  N/A 07/16/2021   Procedure: COLONOSCOPY WITH PROPOFOL ;  Surgeon: Eda Iha, MD;  Location: [facility] ENDOSCOPY;  Service: Gastroenterology;  Laterality: N/A;   ESOPHAGOGASTRODUODENOSCOPY (EGD) WITH PROPOFOL  N/A 11/23/2021   Procedure: ESOPHAGOGASTRODUODENOSCOPY (EGD) WITH PROPOFOL ;  Surgeon: Eda Iha, MD;  Location: [facility];  Service: Gastroenterology;  Laterality: N/A;   FOOT SURGERY Right 11/2022   GASTROCNEMIUS RECESSION Bilateral 04/25/2020   Procedure: BILATERAL GASTROCNEMIUS RECESSION;  Surgeon: Harden Jerona GAILS, MD;  Location: [facility] OR;  Service: Orthopedics;  Laterality: Bilateral;   SUPPRELIN  IMPLANT  05/06/2011   Procedure: SUPPRELIN  IMPLANT;  Surgeon: CHRISTELLA. Julietta Millman, MD;  Location: [facility];  Service: Pediatrics;  Laterality: Right;   SUPPRELIN  IMPLANT Right 06/14/2013   Procedure: REMOVAL OF SUPPRELIN  IMPLANT FROM RIGHT UPPER ARM;  Surgeon: CHRISTELLA. Julietta Millman, MD;  Location: [facility];  Service: Pediatrics;  Laterality: Right;   TONSILLECTOMY     TONSILLECTOMY AND ADENOIDECTOMY     TYMPANOSTOMY TUBE PLACEMENT     x 2   Patient Active Problem List   Diagnosis Date Noted   Hyperhidrosis 12/30/2021   Dysphagia    OSA (obstructive sleep apnea) 09/30/2021   Bilateral hand numbness 09/15/2021   Acne vulgaris 08/26/2021   Noninfectious gastroenteritis    Bilateral arm pain 06/12/2021   DUB (dysfunctional uterine bleeding) 05/14/2021   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   Other allergic rhinitis 08/20/2020   Allergic conjunctivitis of both eyes 08/20/2020   Encounter for general adult medical examination with abnormal findings 07/03/2020   Leg length discrepancy 06/05/2020   Hidradenitis suppurativa 06/05/2020   Achilles  tendon contracture, bilateral    Elevated sedimentation rate 02/07/2020   Pain in right ankle and joints of right foot 02/07/2020   Anemia 12/29/2018   Type 2 diabetes mellitus (HCC) 08/15/2017   Elevated triglycerides with high cholesterol 12/09/2016   Mild intellectual disability 08/16/2012   Laxity of ligament 08/16/2012   Delayed milestones 08/16/2012   Obesity 06/07/2011   Global developmental delay    Petit mal without grand mal seizures (HCC)    Dyspraxia     PCP: Rollene [name] LABOR,  MD  REFERRING PROVIDER: Silva Juliene SAUNDERS, DPM   REFERRING DIAG: [phone] (ICD-10-CM) - Coalition, calcaneal tarsal   THERAPY DIAG:  Pain in right ankle and joints of right foot  Muscle weakness (generalized)  Other abnormalities of gait and mobility  Rationale for Evaluation and Treatment: Rehabilitation  ONSET DATE: 12/24/2022  SUBJECTIVE:   SUBJECTIVE STATEMENT: 03/10/2023: Pt presents to PT with reports of increase in R ankle/foot pain. Has continued HEP compliant.   Per eval - Pt presents to PT s/p R foot surgery on 12/24/2022 for middle facet coalition resection. Notes pain in R medial foot but otherwise has been doing well ambulating with SPC and boot on R foot. Has been cleared for WBAT outside of boot. Has some occasional tingling in R foot but otherwise no paresthesias.   PERTINENT HISTORY: DM II, White Sutton syndrome  PAIN:  Are you having pain?  Yes: NPRS scale: 6/10 Worst: 7/10 Pain location: R medial ankle/foot Pain description: achy  Aggravating factors: walking, standing Relieving factors: rest  PRECAUTIONS: None  RED FLAGS: None   WEIGHT BEARING RESTRICTIONS: WBAT R LE  FALLS:  Has patient fallen in last 6 months? No  LIVING ENVIRONMENT: Lives with: lives with their family Lives in: House/apartment Stairs: Yes: Internal: 12 steps; on right going up Has following equipment at home: Single point cane  OCCUPATION: On disability  PLOF: Independent with basic ADLs  PATIENT GOALS: pt would like to get back to playing sports with friends for fun, decrease her foot pain  NEXT MD VISIT: PRN  OBJECTIVE:  Note: Objective measures were completed at Evaluation unless otherwise noted.  DIAGNOSTIC FINDINGS: See imaging  PATIENT SURVEYS:  FOTO: 50% function; 69% predicted  COGNITION: Overall cognitive status: Within functional limits for tasks assessed     SENSATION: WFL  POSTURE: rounded shoulders, forward head, and healing incisional scar on R  medial foot  PALPATION: No overt TTP noted  LOWER EXTREMITY ROM:  Active ROM Right eval Left eval  Hip flexion    Hip extension    Hip abduction    Hip adduction    Hip internal rotation    Hip external rotation    Knee flexion    Knee extension    Ankle dorsiflexion Lacking 5 10  Ankle plantarflexion    Ankle inversion    Ankle eversion     (Blank rows = not tested)  LOWER EXTREMITY MMT:  MMT Right eval Left eval  Hip flexion    Hip extension    Hip abduction    Hip adduction    Hip internal rotation    Hip external rotation    Knee flexion    Knee extension    Ankle dorsiflexion 3+/5 5/5  Ankle plantarflexion 3+/5 5/5  Ankle inversion 3+/5 5/5  Ankle eversion 3+/5 5/5   (Blank rows = not tested)  LOWER EXTREMITY SPECIAL TESTS:  DNT  FUNCTIONAL TESTS:  30 Second Sit to Stand: 8 reps  GAIT: Distance walked: 37ft Assistive device utilized: Single point cane Level of assistance: Complete Independence Comments: antalgic    TREATMENT: [facility] Adult PT Treatment:                                                DATE: 03/10/2023 Therapeutic Exercise: Nu step L5 LE only 5 min during subjective Slant board calf stretch 2x45 Standing heel raises 2x15 weaning UE support  Standing mini squat bilat UE support 2x10 Wobble board 2x15 fwd/bwd Tandem on foam 2x30 R back Seated heel raise 2x15 25# R STS R foot back 2x10 - no UE  Single leg press 2x10 R 15# R ankle 4-way YTB x 10  [facility] Adult PT Treatment:                                                DATE: 03/07/2023 Therapeutic Exercise: Nu step L5 LE only 5 min during subjective Slant board calf stretch 2x45 Standing heel raises 2x15 weaning UE support  Standing mini squat bilat UE support 2x10 FT of foam x 30 Tandem on foam 2x30 R back  Towel scrunch 2x60 R R ankle 4-way YTB x 10 Seated L3 BAPS cw/ccw 2x10 each AP RB seated 2x60 fwd/bwd STS R foot back 2x10 - no UE L TKE with ball 2x10  R  LE  [facility] Adult PT Treatment:                                                DATE: 03/03/2023 Therapeutic Exercise: Nu step L5 LE only 5 min during subjective Slant board calf stretch 2x30 Standing heel raises 2x15 weaning UE support  Standing mini squat bilat UE support 2x10 Towel scrunch 2x60 R R ankle 4-way YTB 2x10 Seated L3 BAPS cw/ccw 2x10 each AP RB seated 2x15 fwd/bwd Tandem stance R back 2x30 FT EC on foam x 30  [facility] Adult PT Treatment:                                                DATE: 02/24/23 Therapeutic Exercise: Nu step L5 LE only 5 min during subjective Standing heel raises 2x10 weaning UE support  AP rockerboard 2x15 barefoot, cues for toe mechanics Seated isolated hallux ext x20 cues to reduce smaller toe compensations Seated L4 BAPS eversion/inversion x20 cues for reduced knee compensations Towel scrunch seated x20 HEP update + education/handout Therapeutic Activity: Staggered stance weight shifts w/ UE support 2x12 BIL cues for mechanics, pacing, and BOS BW squat w/ UE support at CC 2x10 cues for comfortable ROM and mechanics    [facility] Adult PT Treatment:                                                DATE: 02/21/23 Therapeutic Exercise: Seated heel/toe raises x20 L4 BAPS PF/DF k84  cues for control and positioning L4 ankle eversion/inversion x15 AP rockerboard 2x12 barefoot w/ cues for reduced toe compensations Seated isolated hallux extension x15 cues for reduced compensations Seated TKE into ball 3x8 cues for quad contraction and foot positioning  STS 2x10 raised mat exaggerated weight shift to R on first set to improve symmetry of WB Slantboard rocking gastroc stretch 2x8 cues for comfortable ROM  Slantboard rocking soleus stretch 2x8 cues for comfortable ROM  [facility] Adult PT Treatment:                                                DATE: 02/17/23 Therapeutic Exercise: Seated heel/toe raises 2x15 Standing soleus stretch, gentle rocking x8 cues for  setup, wall support Standing gastroc stretch gentle rocking x8 cues for positioning  Seated red band PF x15 cues for positioning Seated red band DF 2x8 PT assisted cues for control Staggered stance weight shifts BIL 2x8 w/ UE support cues for mechanics Seated toe extensions x10 cues for tripod foot Seated hallux ext 2x10 cues for tripod  L3 BAPS DF/PF x10 L3 BAPS circles x10 CW/CCW     [facility] Adult PT Treatment:                                                DATE: 02/08/23 Therapeutic Exercise: Seated toe flexion and extensions Seated heel and toe raises Seated ankle pumps/ circles Seated calf stretch with towel 30 sec x 3  Standing heel and toe raises Tandem stance 20 RLE SLS 4 sec R, 15 sec L Runners stretch rocking for DF mob Manual Therapy: Right ankle mobs  PROM right ankle   Therapeutic Activity: Gait cues for equal step and heel strike   PATIENT EDUCATION:  Education details: rationale for interventions, HEP  Person educated: Patient Education method: Explanation, Demonstration, Tactile cues, Verbal cues Education comprehension: verbalized understanding, returned demonstration, verbal cues required, tactile cues required, and needs further education     HOME EXERCISE PROGRAM: Access Code: [email or web address] URL: [email or web address] Date: 02/24/2023 Prepared by: Alm Jenny  Exercises - Standing Tandem Balance with Counter Support  - 1 x daily - 7 x weekly - 3 sets - 30 sec hold - Long Sitting Calf Stretch with Strap  - 1 x daily - 7 x weekly - 3 reps - 30 sec hold - Towel Scrunches  - 2-3 x daily - 7 x weekly - 15 reps - Heel Toe Raises with Counter Support  - 2-3 x daily - 7 x weekly - 1 sets - 10 reps - Sit to stand with control  - 1 x daily - 7 x weekly - 1 sets - 10 reps - Standing Gastroc Stretch at Counter  - 1 x daily - 7 x weekly - 1 sets - 10 reps - 5 hold - Staggered Stance Forward Backward Weight Shift with Counter Support  - 2-3 x daily - 7 x  weekly - 1 sets - 8 reps  ASSESSMENT:  CLINICAL IMPRESSION: 03/10/2023: Pt was able to complete all prescribed exercises with no adverse effect or increase in pain. Therapy today also focused on improving L ankle strength, proprioception, and mobility. Patient continues to benefit from skilled PT services, will continue to progress as  tolerated per POC.   EVAL: Patient is a 23 y.o. F who was seen today for physical therapy evaluation and treatment for s/p R foot surgery on 12/24/2022 for middle facet coalition resection. Physical findings are consistent with surgery and recovery timeline as pt demonstrates decrease in R ankle strength, ROM, and balance. FOTO score demonstrates decrease in subjective functional ability below PLOF. Pt would benefit from skilled PT services working on normalizing gait, ankle strength and balance post surgery.   OBJECTIVE IMPAIRMENTS: Abnormal gait, decreased activity tolerance, decreased balance, decreased mobility, difficulty walking, decreased ROM, decreased strength, and pain  ACTIVITY LIMITATIONS: carrying, lifting, standing, squatting, stairs, transfers, and locomotion level  PARTICIPATION LIMITATIONS: meal prep, cleaning, driving, shopping, occupation, and yard work  PERSONAL FACTORS: 1-2 comorbidities: DM II, White Sutton syndrome  are also affecting patient's functional outcome.   REHAB POTENTIAL: Excellent  CLINICAL DECISION MAKING: Stable/uncomplicated  EVALUATION COMPLEXITY: Low   GOALS: Goals reviewed with patient? No  SHORT TERM GOALS: Target date: 02/15/2023   Pt will be compliant and knowledgeable with initial HEP for improved comfort and carryover Baseline: initial HEP given  02/08/23: requires mod cues Goal status: ONGOING  2.  Pt will self report R ankle/foot pain no greater than 5/10 for improved comfort and functional ability Baseline: 7/10 at worst Goal status: ONGOING   LONG TERM GOALS: Target date: 04/19/2023   Pt will improve  FOTO function score to no less than 69% as proxy for functional improvement Baseline: 50% function Goal status: INITIAL   2.  Pt will self report R foot/ankle pain no greater than 2/10 for improved comfort and functional ability Baseline: 7/10 at worst Goal status: INITIAL   3.  Pt will increase 30 Second Sit to Stand rep count to no less than 12 reps for improved balance, strength, and functional mobility Baseline: 8 reps  Goal status: INITIAL   4.  Pt will improve R ankle DF AROM to no less 10 degrees for improved gait and decrease pain Baseline: lacking 5 Goal status: INITIAL  5.  Pt will be able be to get back to playing sports with friends not limited by R ankle/foot pain  Baseline: unable Goal status: INITIAL   PLAN:  PT FREQUENCY: 2x/week  PT DURATION: 8 weeks  PLANNED INTERVENTIONS: 97164- PT Re-evaluation, 97110-Therapeutic exercises, 97530- Therapeutic activity, 97112- Neuromuscular re-education, 97535- Self Care, [id number]- Manual therapy, [id number]- Gait training, 97014- Electrical stimulation (unattended), [id number]- Electrical stimulation (manual), 97016- Vasopneumatic device, Dry Needling, Cryotherapy, and Moist heat  PLAN FOR NEXT SESSION: review/update HEP PRN, R ankle strength and balance, calf stretching    Alm JAYSON Kingdom PT  03/10/23 10:40 AM

## 2023-03-14 ENCOUNTER — Ambulatory Visit: Payer: 59 | Admitting: Physical Therapy

## 2023-03-15 ENCOUNTER — Encounter: Payer: Self-pay | Admitting: Podiatry

## 2023-03-15 ENCOUNTER — Ambulatory Visit (INDEPENDENT_AMBULATORY_CARE_PROVIDER_SITE_OTHER): Payer: 59 | Admitting: Podiatry

## 2023-03-15 DIAGNOSIS — M79674 Pain in right toe(s): Secondary | ICD-10-CM

## 2023-03-15 DIAGNOSIS — B351 Tinea unguium: Secondary | ICD-10-CM | POA: Diagnosis not present

## 2023-03-15 DIAGNOSIS — M79675 Pain in left toe(s): Secondary | ICD-10-CM

## 2023-03-15 DIAGNOSIS — E119 Type 2 diabetes mellitus without complications: Secondary | ICD-10-CM

## 2023-03-15 DIAGNOSIS — B353 Tinea pedis: Secondary | ICD-10-CM

## 2023-03-15 MED ORDER — KETOCONAZOLE 2 % EX CREA: TOPICAL_CREAM | CUTANEOUS | 1 refills | Status: DC

## 2023-03-17 ENCOUNTER — Ambulatory Visit: Payer: 59

## 2023-03-17 ENCOUNTER — Ambulatory Visit: Payer: 59 | Admitting: Behavioral Health

## 2023-03-17 DIAGNOSIS — M25571 Pain in right ankle and joints of right foot: Secondary | ICD-10-CM

## 2023-03-17 DIAGNOSIS — M542 Cervicalgia: Secondary | ICD-10-CM | POA: Diagnosis not present

## 2023-03-17 DIAGNOSIS — F419 Anxiety disorder, unspecified: Secondary | ICD-10-CM

## 2023-03-17 DIAGNOSIS — M6281 Muscle weakness (generalized): Secondary | ICD-10-CM | POA: Diagnosis not present

## 2023-03-17 DIAGNOSIS — R4589 Other symptoms and signs involving emotional state: Secondary | ICD-10-CM

## 2023-03-17 DIAGNOSIS — R2689 Other abnormalities of gait and mobility: Secondary | ICD-10-CM

## 2023-03-17 NOTE — Progress Notes (Signed)
[facility] Counselor/Therapist Progress Note  Patient ID: Deanna Levine, MRN: [id number],    Date: 03/17/2023  Time Spent: 55 min Caregility video; Pt is home in private & Provider working remotely from [employer]. Pt is aware of the risks/limitations of telehealth & consents to tx today. Time In: 3:00pm Time Out: 3:55pm  Treatment Type: Individual Therapy  Reported Symptoms: Reduction in anx/dep & stress  Mental Status Exam: Appearance:  Casual     Behavior: Appropriate and Sharing  Motor: Normal  Speech/Language:  Clear and Coherent  Affect: Appropriate  Mood: normal  Thought process: normal  Thought content:   WNL  Sensory/Perceptual disturbances:   WNL  Orientation: oriented to person, place, time/date, and situation  Attention: Good  Concentration: Good  Memory: WNL  Fund of knowledge:  Fair; some developmental delays in abstraction  Insight:   Fair  Judgment:  Good  Impulse Control: Good   Risk Assessment: Danger to Self:  No Self-injurious Behavior: No Danger to Others: No Duty to Warn:no Physical Aggression / Violence:No  Access to Firearms a concern: No  Gang Involvement:No   Subjective: Pt is excited for the upcoming Christmas Holiday season. She lives w/her Parents & they have fun plans around Christmas to participate in Community events & spend time w/other Family members. Pt is positive about her experiences in the Family during the Christmas Season.    Interventions: Ego-Supportive  Diagnosis:Anxiety  Plan: [place] is upbeat today & positive. 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 She expresses appreciation for all her Parents do & her Sonda Primes is also by the home often. She looks forward to the [place] Year & has goals she wants to discuss for 2025 @ our next visit. Her worries for her health have also diminished  somewhat as she is more active keeping the home clean & her private space in good condition.   Target Date: 03/31/2023  Progress: 7  Frequency: Once every 3 wks  Modality: Claretta Fraise, LMFT

## 2023-03-17 NOTE — Therapy (Signed)
OUTPATIENT PHYSICAL THERAPY LOWER EXTREMITY TREATMENT   Patient Name: Deanna Levine MRN: [id number] DOB:[birth year], 23 y.o., female Today's Date: 03/17/2023  END OF SESSION:  PT End of Session - 03/17/23 0931     Visit Number 9    Number of Visits 24    Date for PT Re-Evaluation 04/19/23    Authorization Type [employer] Dual Complete    PT Start Time 0928    PT Stop Time 1010    PT Time Calculation (min) 42 min    Activity Tolerance Patient tolerated treatment well    Behavior During Therapy WFL for tasks assessed/performed                    Past Medical History:  Diagnosis Date   Absence seizure disorder (HCC)    Allergy    Colitis 07/16/2021   Complication of anesthesia    slow to wake up from anesthesia, occ HA with anesthesia    Diabetes mellitus without complication (HCC)    type 2   Difficulty swallowing pills    Dyspraxia    Eczema    Global developmental delay    Obesity (BMI 30-39.9)    PONV (postoperative nausea and vomiting)    Precocious female puberty    Seizures (HCC)    last seizure 2012 - no current med   Sleep apnea    White-Sutton syndrome    Past Surgical History:  Procedure Laterality Date   ADENOIDECTOMY     BALLOON DILATION N/A 11/23/2021   Procedure: BALLOON DILATION;  Surgeon: Tressia Danas, MD;  Location: [facility] ENDOS[facility];  Service: Gastroenterology;  Laterality: N/A;   BIOPSY  07/16/2021   Procedure: BIOPSY;  Surgeon: Tressia Danas, MD;  Location: [facility] ENDOSCOPY;  Service: Gastroenterology;;   BIOPSY  11/23/2021   Procedure: BIOPSY;  Surgeon: Tressia Danas, MD;  Location: [facility] ENDOSCOPY;  Service: Gastroenterology;;   bone removal Left 01/16/2021   foot   COLONOSCOPY WITH PROPOFOL N/A 07/16/2021   Procedure: COLONOSCOPY WITH PROPOFOL;  Surgeon: Tressia Danas, MD;  Location: [facility] ENDOSCOPY;  Service: Gastroenterology;  Laterality: N/A;   ESOPHAGOGASTRODUODENOSCOPY (EGD) WITH PROPOFOL N/A 11/23/2021   Procedure:  ESOPHAGOGASTRODUODENOSCOPY (EGD) WITH PROPOFOL;  Surgeon: Tressia Danas, MD;  Location: [facility];  Service: Gastroenterology;  Laterality: N/A;   FOOT SURGERY Right 11/2022   GASTROCNEMIUS RECESSION Bilateral 04/25/2020   Procedure: BILATERAL GASTROCNEMIUS RECESSION;  Surgeon: Nadara Mustard, MD;  Location: [facility] OR;  Service: Orthopedics;  Laterality: Bilateral;   SUPPRELIN IMPLANT  05/06/2011   Procedure: SUPPRELIN IMPLANT;  Surgeon: Judie Petit. Leonia Corona, MD;  Location: [facility];  Service: Pediatrics;  Laterality: Right;   SUPPRELIN IMPLANT Right 06/14/2013   Procedure: REMOVAL OF SUPPRELIN IMPLANT FROM RIGHT UPPER ARM;  Surgeon: Judie Petit. Leonia Corona, MD;  Location: [facility];  Service: Pediatrics;  Laterality: Right;   TONSILLECTOMY     TONSILLECTOMY AND ADENOIDECTOMY     TYMPANOSTOMY TUBE PLACEMENT     x 2   Patient Active Problem List   Diagnosis Date Noted   Hyperhidrosis 12/30/2021   Dysphagia    OSA (obstructive sleep apnea) 09/30/2021   Bilateral hand numbness 09/15/2021   Acne vulgaris 08/26/2021   Noninfectious gastroenteritis    Bilateral arm pain 06/12/2021   DUB (dysfunctional uterine bleeding) 05/14/2021   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   Other allergic rhinitis 08/20/2020   Allergic conjunctivitis of both eyes 08/20/2020   Encounter for general adult medical examination with abnormal findings 07/03/2020   Leg  length discrepancy 06/05/2020   Hidradenitis suppurativa 06/05/2020   Achilles tendon contracture, bilateral    Elevated sedimentation rate 02/07/2020   Pain in right ankle and joints of right foot 02/07/2020   Anemia 12/29/2018   Type 2 diabetes mellitus (HCC) 08/15/2017   Elevated triglycerides with high cholesterol 12/09/2016   Mild intellectual disability 08/16/2012   Laxity of ligament 08/16/2012   Delayed milestones 08/16/2012   Obesity 06/07/2011   Global developmental delay    Petit mal without grand mal seizures  (HCC)    Dyspraxia     PCP: Myrlene Broker, MD  REFERRING PROVIDER: Edwin Cap, DPM   REFERRING DIAG: [id number] (ICD-10-CM) - Coalition, calcaneal tarsal   THERAPY DIAG:  Pain in right ankle and joints of right foot  Muscle weakness (generalized)  Other abnormalities of gait and mobility  Rationale for Evaluation and Treatment: Rehabilitation  ONSET DATE: 12/24/2022  SUBJECTIVE:   SUBJECTIVE STATEMENT: 03/17/2023: Pt presents to PT with reports of continued R foot/ankle pain that has persisted. Feels like she is getting stronger but this ebbs and flows a bit.   Per eval - Pt presents to PT s/p R foot surgery on 12/24/2022 for middle facet coalition resection. Notes pain in R medial foot but otherwise has been doing well ambulating with SPC and boot on R foot. Has been cleared for WBAT outside of boot. Has some occasional tingling in R foot but otherwise no paresthesias.   PERTINENT HISTORY: DM II, White Sutton syndrome  PAIN:  Are you having pain?  Yes: NPRS scale: 6/10 Worst: 7/10 Pain location: R medial ankle/foot Pain description: achy  Aggravating factors: walking, standing Relieving factors: rest  PRECAUTIONS: None  RED FLAGS: None   WEIGHT BEARING RESTRICTIONS: WBAT R LE  FALLS:  Has patient fallen in last 6 months? No  LIVING ENVIRONMENT: Lives with: lives with their family Lives in: House/apartment Stairs: Yes: Internal: 12 steps; on right going up Has following equipment at home: Single point cane  OCCUPATION: On disability  PLOF: Independent with basic ADLs  PATIENT GOALS: pt would like to get back to playing sports with friends for fun, decrease her foot pain  NEXT MD VISIT: PRN  OBJECTIVE:  Note: Objective measures were completed at Evaluation unless otherwise noted.  DIAGNOSTIC FINDINGS: See imaging  PATIENT SURVEYS:  FOTO: 50% function; 69% predicted  COGNITION: Overall cognitive status: Within functional limits for tasks  assessed     SENSATION: WFL  POSTURE: rounded shoulders, forward head, and healing incisional scar on R medial foot  PALPATION: No overt TTP noted  LOWER EXTREMITY ROM:  Active ROM Right eval Left eval  Hip flexion    Hip extension    Hip abduction    Hip adduction    Hip internal rotation    Hip external rotation    Knee flexion    Knee extension    Ankle dorsiflexion Lacking 5 10  Ankle plantarflexion    Ankle inversion    Ankle eversion     (Blank rows = not tested)  LOWER EXTREMITY MMT:  MMT Right eval Left eval  Hip flexion    Hip extension    Hip abduction    Hip adduction    Hip internal rotation    Hip external rotation    Knee flexion    Knee extension    Ankle dorsiflexion 3+/5 5/5  Ankle plantarflexion 3+/5 5/5  Ankle inversion 3+/5 5/5  Ankle eversion 3+/5 5/5   (Blank rows =  not tested)  LOWER EXTREMITY SPECIAL TESTS:  DNT  FUNCTIONAL TESTS:  30 Second Sit to Stand: 8 reps    GAIT: Distance walked: 76ft Assistive device utilized: Single point cane Level of assistance: Complete Independence Comments: antalgic    TREATMENT: [facility] Adult PT Treatment:                                                DATE: 03/17/2023 Therapeutic Exercise: Nu step L5 LE only x 5 min during subjective Slant board calf stretch 2x45" Standing heel raises with ball 2x15  Standing mini squat bilat UE support 2x10 Wobble board x 60" fwd/bwd Tandem on foam 2x30" R back Side step on toes while on foam beam x 4 laps at counter Single leg press 2x10 R 15# STS R foot back 10# KB 2x10 - low table R ankle 4-way YTB x 10 BAPS L4 cw/ccw 2x10  [facility] Adult PT Treatment:                                                DATE: 03/10/2023 Therapeutic Exercise: Nu step L5 LE only 5 min during subjective Slant board calf stretch 2x45" Standing heel raises 2x15 weaning UE support  Standing mini squat bilat UE support 2x10 Wobble board 2x15 fwd/bwd Tandem on foam 2x30" R  back Seated heel raise 2x15 25# R STS R foot back 2x10 - no UE  Single leg press 2x10 R 15# R ankle 4-way YTB x 10  [facility] Adult PT Treatment:                                                DATE: 03/07/2023 Therapeutic Exercise: Nu step L5 LE only 5 min during subjective Slant board calf stretch 2x45" Standing heel raises 2x15 weaning UE support  Standing mini squat bilat UE support 2x10 FT of foam x 30" Tandem on foam 2x30" R back  Towel scrunch 2x60" R R ankle 4-way YTB x 10 Seated L3 BAPS cw/ccw 2x10 each AP RB seated 2x60" fwd/bwd STS R foot back 2x10 - no UE L TKE with ball 2x10  R LE  [facility] Adult PT Treatment:                                                DATE: 03/03/2023 Therapeutic Exercise: Nu step L5 LE only 5 min during subjective Slant board calf stretch 2x30" Standing heel raises 2x15 weaning UE support  Standing mini squat bilat UE support 2x10 Towel scrunch 2x60" R R ankle 4-way YTB 2x10 Seated L3 BAPS cw/ccw 2x10 each AP RB seated 2x15 fwd/bwd Tandem stance R back 2x30" FT EC on foam x 30"  PATIENT EDUCATION:  Education details: rationale for interventions, HEP  Person educated: Patient Education method: Explanation, Demonstration, Tactile cues, Verbal cues Education comprehension: verbalized understanding, returned demonstration, verbal cues required, tactile cues required, and needs further education     HOME EXERCISE PROGRAM: Access Code: [email or web address] URL:  [email or web address] Date: 02/24/2023 Prepared by: Fransisco Hertz  Exercises - Standing Tandem Balance with Counter Support  - 1 x daily - 7 x weekly - 3 sets - 30 sec hold - Long Sitting Calf Stretch with Strap  - 1 x daily - 7 x weekly - 3 reps - 30 sec hold - Towel Scrunches  - 2-3 x daily - 7 x weekly - 15 reps - Heel Toe Raises with Counter Support  - 2-3 x daily - 7 x weekly - 1 sets - 10 reps - Sit to stand with control  - 1 x daily - 7 x weekly - 1 sets - 10 reps - Standing Gastroc  Stretch at Counter  - 1 x daily - 7 x weekly - 1 sets - 10 reps - 5 hold - Staggered Stance Forward Backward Weight Shift with Counter Support  - 2-3 x daily - 7 x weekly - 1 sets - 8 reps  ASSESSMENT:  CLINICAL IMPRESSION: 03/17/2023: Pt was able to complete all prescribed exercises with no adverse effect or increase in pain. Therapy today continued to focus on improving L ankle strength, proprioception, and mobility. Patient continues to benefit from skilled PT services, will continue to progress as tolerated per POC.  EVAL: Patient is a 23 y.o. F who was seen today for physical therapy evaluation and treatment for s/p R foot surgery on 12/24/2022 for middle facet coalition resection. Physical findings are consistent with surgery and recovery timeline as pt demonstrates decrease in R ankle strength, ROM, and balance. FOTO score demonstrates decrease in subjective functional ability below PLOF. Pt would benefit from skilled PT services working on normalizing gait, ankle strength and balance post surgery.   OBJECTIVE IMPAIRMENTS: Abnormal gait, decreased activity tolerance, decreased balance, decreased mobility, difficulty walking, decreased ROM, decreased strength, and pain  ACTIVITY LIMITATIONS: carrying, lifting, standing, squatting, stairs, transfers, and locomotion level  PARTICIPATION LIMITATIONS: meal prep, cleaning, driving, shopping, occupation, and yard work  PERSONAL FACTORS: 1-2 comorbidities: DM II, White Sutton syndrome  are also affecting patient's functional outcome.   REHAB POTENTIAL: Excellent  CLINICAL DECISION MAKING: Stable/uncomplicated  EVALUATION COMPLEXITY: Low   GOALS: Goals reviewed with patient? No  SHORT TERM GOALS: Target date: 02/15/2023   Pt will be compliant and knowledgeable with initial HEP for improved comfort and carryover Baseline: initial HEP given  02/08/23: requires mod cues Goal status: ONGOING  2.  Pt will self report R ankle/foot pain no  greater than 5/10 for improved comfort and functional ability Baseline: 7/10 at worst Goal status: ONGOING   LONG TERM GOALS: Target date: 04/19/2023   Pt will improve FOTO function score to no less than 69% as proxy for functional improvement Baseline: 50% function Goal status: INITIAL   2.  Pt will self report R foot/ankle pain no greater than 2/10 for improved comfort and functional ability Baseline: 7/10 at worst Goal status: INITIAL   3.  Pt will increase 30 Second Sit to Stand rep count to no less than 12 reps for improved balance, strength, and functional mobility Baseline: 8 reps  Goal status: INITIAL   4.  Pt will improve R ankle DF AROM to no less 10 degrees for improved gait and decrease pain Baseline: lacking 5 Goal status: INITIAL  5.  Pt will be able be to get back to playing sports with friends not limited by R ankle/foot pain  Baseline: unable Goal status: INITIAL   PLAN:  PT FREQUENCY: 2x/week  PT DURATION: 8 weeks  PLANNED INTERVENTIONS: 97164- PT Re-evaluation, 97110-Therapeutic exercises, 97530- Therapeutic activity, [id number]- Neuromuscular re-education, 97535- Self Care, 29562- Manual therapy, [id number]- Gait training, 97014- Electrical stimulation (unattended), [id number]- Electrical stimulation (manual), 97016- Vasopneumatic device, Dry Needling, Cryotherapy, and Moist heat  PLAN FOR NEXT SESSION: review/update HEP PRN, R ankle strength and balance, calf stretching    Eloy End PT  03/17/23 11:25 AM

## 2023-03-17 NOTE — Progress Notes (Signed)
[name]  Patient ID: Deanna Levine, MRN: [id number],    Date: 03/17/2023  Time Spent: 55 min Caregility video; Pt is @ home in private & Provider is working remotely from [employer]. Pt is aware of the risks/limitations of telehealth & consents for Tx today.  Time In: 1:00pm Time Out: 1:55pm   Treatment Type: Individual Therapy  Reported Symptoms: Cont'd reduction in anx/dep since the New Year has started  Mental Status Exam: Appearance:  Casual     Behavior: Appropriate and Sharing  Motor: Normal  Speech/Language:  Clear and Coherent  Affect: Appropriate  Mood: normal  Thought process: normal  Thought content:   WNL  Sensory/Perceptual disturbances:   WNL  Orientation: oriented to person, place, time/date, and situation  Attention: Good  Concentration: Good  Memory: WNL  Fund of knowledge:  Good  Insight:   Good  Judgment:  Good  Impulse Control: Good   Risk Assessment: Danger to Self:  No Self-injurious Behavior: No Danger to Others: No Duty to Warn:no Physical Aggression / Violence:No  Access to Firearms a concern: No  Gang Involvement:No   Subjective: Pt is enjoying the [employer]. [name] positive. Her attitude is different. [name] staying to herself in her room. She likes being alone & will help her Parents when needed. She engages in many actitivities like movies, word search, word games, & her PlayStation 5. This helps her "be at peace" & not bother anyone. She knows her Parents need time alone.    Interventions: Family Systems  Diagnosis:Anxiety  Anxiety about health  Plan: Deanna Levine has decided she wants a lot of time alone. For her Bday in Feb, she will stay to herself, rest, & watch movies. She does get out of the house to pick up her medications & to Clean Bldgs w/her Parents. They have worked around the weather lately by getting up early to complete the job.   Father just got injection for his back &  Mom possibly needs L foot surgery for 2 torn tendens.   Deanna Levine wants to win the Lottery in 2025. It would pay her bills & help her Parents w/bills.   Deanna Levine is participating in a Religious Fasting schedule until Jan 5th. They attend [employer]. This happens every Jan for 28 days. It feels good to be closer to Deanna Levine. [name] confident about the Fasting process & knows the 26th of Jan will get here soon.  Target Date: 04/16/2023  Progress: 6  Frequency: Once every 3 wks  Modality: Claretta Fraise, LMFT

## 2023-03-17 NOTE — Progress Notes (Signed)
                Anastasya Jewell L Farryn Linares, LMFT 

## 2023-03-21 NOTE — Progress Notes (Signed)
  Subjective:  Patient ID: Deanna Levine, female    DOB: 2001/01/25,  MRN: [id number]  23 y.o. female presents preventative diabetic foot care and painful elongated mycotic toenails 1-5 bilaterally which are tender when wearing enclosed shoe gear. Pain is relieved with periodic professional debridement.  She has a middle facet coalition resection of the right foot for correction of tarsal coalition. [name] under care of Dr. Silva. Chief Complaint  Patient presents with   Diabetes    [facility] patient states her A1c is 5.1 and she last saw her PCP in October and also knows who her PCP name . Patient states she had foot surgery last year in October .   New problem(s): None   PCP is Rollene [name] LABOR, MD.  No Known Allergies  Review of Systems: Negative except as noted in the HPI.   Objective:  [name] is a pleasant 23 y.o. female in NAD. AAO x 3.  Vascular Examination: Vascular status intact b/l with palpable pedal pulses. CFT immediate b/l. Pedal hair present. No edema. No pain with calf compression b/l. Skin temperature gradient WNL b/l. No varicosities noted. No cyanosis or clubbing noted.  Neurological Examination: Sensation grossly intact b/l with 10 gram monofilament. Vibratory sensation intact b/l.  Dermatological Examination: Pedal skin with normal turgor, texture and tone b/l. No open wounds nor interdigital macerations noted. Toenails 1-5 b/l thick, discolored, elongated with subungual debris and pain on dorsal palpation. No hyperkeratotic lesions noted b/l.   Diffuse scaling noted peripherally and plantarly b/l feet.  No interdigital macerations.  No blisters, no weeping. No signs of secondary bacterial infection noted.  Musculoskeletal Examination: Muscle strength 5/5 to b/l LE.  No pain, crepitus noted b/l. Flat feet b/l.SABRA Patient ambulates independently without assistive aids.   Radiographs: None  Last A1c:      Latest Ref Rng & Units 08/31/2022    9:07 AM  06/01/2022    9:22 AM  Hemoglobin A1C  Hemoglobin-A1c 4.0 - 5.6 % 5.1  5.1      Assessment:   1. Pain due to onychomycosis of toenails of both feet   2. Tinea pedis of both feet   3. Type 2 diabetes mellitus without complication, without long-term current use of insulin  (HCC)    Plan:  -Consent given for treatment as described below: -Examined patient. -Continue foot and shoe inspections daily. Monitor blood glucose per PCP/Endocrinologist's recommendations. -Patient to continue soft, supportive shoe gear daily. -Mycotic toenails 1-5 bilaterally were debrided in length and girth with sterile nail nippers and dremel without incident. -For tinea pedis, Rx sent to pharmacy for Ketoconazole  Cream 2% to be applied once daily for six weeks. -Patient/POA to call should there be question/concern in the interim.  Return in about 3 months (around 06/13/2023).  Delon LITTIE Merlin, DPM      [facility] LOCATION: [address] [place] [address]                   Office [phone]   [facility] LOCATION: [address] [place], [place] [address] Office [phone]

## 2023-04-04 ENCOUNTER — Telehealth: Payer: Self-pay

## 2023-04-04 ENCOUNTER — Encounter: Payer: Self-pay | Admitting: "Endocrinology

## 2023-04-04 ENCOUNTER — Ambulatory Visit: Payer: 59 | Attending: Internal Medicine

## 2023-04-04 DIAGNOSIS — M25571 Pain in right ankle and joints of right foot: Secondary | ICD-10-CM | POA: Insufficient documentation

## 2023-04-04 DIAGNOSIS — M6281 Muscle weakness (generalized): Secondary | ICD-10-CM | POA: Diagnosis not present

## 2023-04-04 DIAGNOSIS — R2689 Other abnormalities of gait and mobility: Secondary | ICD-10-CM | POA: Diagnosis not present

## 2023-04-04 NOTE — Telephone Encounter (Signed)
 PA needed for [facility]

## 2023-04-04 NOTE — Therapy (Signed)
OUTPATIENT PHYSICAL THERAPY TREATMENT/PROGRESS NOTE  Progress Note Reporting Period 01/25/2023 to 04/04/2023  See note below for Objective Data and Assessment of Progress/Goals.    Patient Name: Deanna Levine MRN: [id number] DOB:2000/10/23, 23 y.o., female Today's Date: 04/04/2023  END OF SESSION:  PT End of Session - 04/04/23 0923     Visit Number 10    Number of Visits 24    Date for PT Re-Evaluation 04/19/23    Authorization Type [employer] Dual Complete    PT Start Time 0930    PT Stop Time 1010    PT Time Calculation (min) 40 min    Activity Tolerance Patient tolerated treatment well    Behavior During Therapy WFL for tasks assessed/performed                     Past Medical History:  Diagnosis Date   Absence seizure disorder (HCC)    Allergy    Colitis 07/16/2021   Complication of anesthesia    slow to wake up from anesthesia, occ HA with anesthesia    Diabetes mellitus without complication (HCC)    type 2   Difficulty swallowing pills    Dyspraxia    Eczema    Global developmental delay    Obesity (BMI 30-39.9)    PONV (postoperative nausea and vomiting)    Precocious female puberty    Seizures (HCC)    last seizure 2012 - no current med   Sleep apnea    White-Sutton syndrome    Past Surgical History:  Procedure Laterality Date   ADENOIDECTOMY     BALLOON DILATION N/A 11/23/2021   Procedure: BALLOON DILATION;  Surgeon: Tressia Danas, MD;  Location: [facility];  Service: Gastroenterology;  Laterality: N/A;   BIOPSY  07/16/2021   Procedure: BIOPSY;  Surgeon: Tressia Danas, MD;  Location: [facility]COPY;  Service: Gastroenterology;;   BIOPSY  11/23/2021   Procedure: BIOPSY;  Surgeon: Tressia Danas, MD;  Location: [facility];  Service: Gastroenterology;;   bone removal Left 01/16/2021   foot   COLONOSCOPY WITH PROPOFOL N/A 07/16/2021   Procedure: COLONOSCOPY WITH PROPOFOL;  Surgeon: Tressia Danas, MD;  Location: [facility] ENDOSCOPY;   Service: Gastroenterology;  Laterality: N/A;   ESOPHAGOGASTRODUODENOSCOPY (EGD) WITH PROPOFOL N/A 11/23/2021   Procedure: ESOPHAGOGASTRODUODENOSCOPY (EGD) WITH PROPOFOL;  Surgeon: Tressia Danas, MD;  Location: [facility];  Service: Gastroenterology;  Laterality: N/A;   FOOT SURGERY Right 11/2022   GASTROCNEMIUS RECESSION Bilateral 04/25/2020   Procedure: BILATERAL GASTROCNEMIUS RECESSION;  Surgeon: Nadara Mustard, MD;  Location: [facility] OR;  Service: Orthopedics;  Laterality: Bilateral;   SUPPRELIN IMPLANT  05/06/2011   Procedure: SUPPRELIN IMPLANT;  Surgeon: Judie Petit. Leonia Corona, MD;  Location: [facility];  Service: Pediatrics;  Laterality: Right;   SUPPRELIN IMPLANT Right 06/14/2013   Procedure: REMOVAL OF SUPPRELIN IMPLANT FROM RIGHT UPPER ARM;  Surgeon: Judie Petit. Leonia Corona, MD;  Location: [facility];  Service: Pediatrics;  Laterality: Right;   TONSILLECTOMY     TONSILLECTOMY AND ADENOIDECTOMY     TYMPANOSTOMY TUBE PLACEMENT     x 2   Patient Active Problem List   Diagnosis Date Noted   Hyperhidrosis 12/30/2021   Dysphagia    OSA (obstructive sleep apnea) 09/30/2021   Bilateral hand numbness 09/15/2021   Acne vulgaris 08/26/2021   Noninfectious gastroenteritis    Bilateral arm pain 06/12/2021   DUB (dysfunctional uterine bleeding) 05/14/2021   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   Other allergic rhinitis 08/20/2020   Allergic  conjunctivitis of both eyes 08/20/2020   Encounter for general adult medical examination with abnormal findings 07/03/2020   Leg length discrepancy 06/05/2020   Hidradenitis suppurativa 06/05/2020   Achilles tendon contracture, bilateral    Elevated sedimentation rate 02/07/2020   Pain in right ankle and joints of right foot 02/07/2020   Anemia 12/29/2018   Type 2 diabetes mellitus (HCC) 08/15/2017   Elevated triglycerides with high cholesterol 12/09/2016   Mild intellectual disability 08/16/2012   Laxity of ligament 08/16/2012    Delayed milestones 08/16/2012   Obesity 06/07/2011   Global developmental delay    Petit mal without grand mal seizures (HCC)    Dyspraxia     PCP: Myrlene Broker, MD  REFERRING PROVIDER: Edwin Cap, DPM   REFERRING DIAG: [phone] (ICD-10-CM) - Coalition, calcaneal tarsal   THERAPY DIAG:  Pain in right ankle and joints of right foot  Muscle weakness (generalized)  Other abnormalities of gait and mobility  Rationale for Evaluation and Treatment: Rehabilitation  ONSET DATE: 12/24/2022  SUBJECTIVE:   SUBJECTIVE STATEMENT: Pt presents to PT with reports of no current pain in R foot/ankle. Pt has continued with HEP compliance, no adverse effect noted.   PERTINENT HISTORY: DM II, White Sutton syndrome  PAIN:  Are you having pain?  Yes: NPRS scale: 6/10 Worst: 7/10 Pain location: R medial ankle/foot Pain description: achy  Aggravating factors: walking, standing Relieving factors: rest  PRECAUTIONS: None  RED FLAGS: None   WEIGHT BEARING RESTRICTIONS: WBAT R LE  FALLS:  Has patient fallen in last 6 months? No  LIVING ENVIRONMENT: Lives with: lives with their family Lives in: House/apartment Stairs: Yes: Internal: 12 steps; on right going up Has following equipment at home: Single point cane  OCCUPATION: On disability  PLOF: Independent with basic ADLs  PATIENT GOALS: pt would like to get back to playing sports with friends for fun, decrease her foot pain  NEXT MD VISIT: PRN  OBJECTIVE:  Note: Objective measures were completed at Evaluation unless otherwise noted.  DIAGNOSTIC FINDINGS: See imaging  PATIENT SURVEYS:  FOTO: 50% function; 69% predicted 04/04/2023: 69% function  COGNITION: Overall cognitive status: Within functional limits for tasks assessed     SENSATION: WFL  POSTURE: rounded shoulders, forward head, and healing incisional scar on R medial foot  PALPATION: No overt TTP noted  LOWER EXTREMITY ROM:  Active ROM  Right eval Right 04/04/23  Hip flexion    Hip extension    Hip abduction    Hip adduction    Hip internal rotation    Hip external rotation    Knee flexion    Knee extension    Ankle dorsiflexion Lacking 5 Lacking 3  Ankle plantarflexion    Ankle inversion    Ankle eversion     (Blank rows = not tested)  LOWER EXTREMITY MMT:  MMT Right eval Left eval  Hip flexion    Hip extension    Hip abduction    Hip adduction    Hip internal rotation    Hip external rotation    Knee flexion    Knee extension    Ankle dorsiflexion 3+/5 5/5  Ankle plantarflexion 3+/5 5/5  Ankle inversion 3+/5 5/5  Ankle eversion 3+/5 5/5   (Blank rows = not tested)  LOWER EXTREMITY SPECIAL TESTS:  DNT  FUNCTIONAL TESTS:  30 Second Sit to Stand: 8 reps  04/04/2023: 10 reps  GAIT: Distance walked: 28ft Assistive device utilized: Single point cane Level of assistance: Complete  Independence Comments: antalgic   TREATMENT: [facility] Adult PT Treatment:                                                  Therapeutic Exercise: Nu step L5 LE only x 4 min during subjective Slant board calf stretch 2x45" Standing heel raises 2x15  Standing mini squat bilat UE support 2x10 Single leg press 2x10 R 15# R ankle DF 2x15 YTB Neuromuscular Re-Ed: Wobble board 2x30" fwd/bwd Tandem on foam 2x30" R back Side step on toes/heels while on foam beam x 3 laps each at counter  BAPS L4 cw/ccw 2x10 Therapeutic Activity: Standing mini squat - 1 UE support 2x10 for improving ability to lift objects from ground Step ups 2x10 fwd 8in - 1 UE support Assessment of tests/measures, goals, and outcomes for progress note   PATIENT EDUCATION:  Education details: rationale for interventions, HEP  Person educated: Patient Education method: Explanation, Demonstration, Tactile cues, Verbal cues Education comprehension: verbalized understanding, returned demonstration, verbal cues required, tactile cues required, and needs further  education     HOME EXERCISE PROGRAM: Access Code: [email or web address] URL: [email or web address] Date: 02/24/2023 Prepared by: Fransisco Hertz  Exercises - Standing Tandem Balance with Counter Support  - 1 x daily - 7 x weekly - 3 sets - 30 sec hold - Long Sitting Calf Stretch with Strap  - 1 x daily - 7 x weekly - 3 reps - 30 sec hold - Towel Scrunches  - 2-3 x daily - 7 x weekly - 15 reps - Heel Toe Raises with Counter Support  - 2-3 x daily - 7 x weekly - 1 sets - 10 reps - Sit to stand with control  - 1 x daily - 7 x weekly - 1 sets - 10 reps - Standing Gastroc Stretch at Counter  - 1 x daily - 7 x weekly - 1 sets - 10 reps - 5 hold - Staggered Stance Forward Backward Weight Shift with Counter Support  - 2-3 x daily - 7 x weekly - 1 sets - 8 reps  ASSESSMENT:  CLINICAL IMPRESSION: Pt was able to complete all prescribed exercises with no adverse effect. Therapy today focused on exercises for improving strength and ROM of R ankle, overall balance and proprioception of R LE, and functional movements for improving ability with squatting and lifting to help with ADLs. Over the course of PT treatment patient has continued to improve functionally as evidenced by improving 30 Second Sit to Stand repetitions and meeting FOTO goal. She continues to lack R ankle DF ROM and pain continues to fluctuate. PT will continue over next few weeks with emphasis on improving R ankle DF and overall functional mobility.  EVAL: Patient is a 23 y.o. F who was seen today for physical therapy evaluation and treatment for s/p R foot surgery on 12/24/2022 for middle facet coalition resection. Physical findings are consistent with surgery and recovery timeline as pt demonstrates decrease in R ankle strength, ROM, and balance. FOTO score demonstrates decrease in subjective functional ability below PLOF. Pt would benefit from skilled PT services working on normalizing gait, ankle strength and balance post surgery.    OBJECTIVE IMPAIRMENTS: Abnormal gait, decreased activity tolerance, decreased balance, decreased mobility, difficulty walking, decreased ROM, decreased strength, and pain  ACTIVITY LIMITATIONS: carrying, lifting, standing, squatting, stairs, transfers, and locomotion level  PARTICIPATION  LIMITATIONS: meal prep, cleaning, driving, shopping, occupation, and yard work  PERSONAL FACTORS: 1-2 comorbidities: DM II, White Sutton syndrome  are also affecting patient's functional outcome.   REHAB POTENTIAL: Excellent  CLINICAL DECISION MAKING: Stable/uncomplicated  EVALUATION COMPLEXITY: Low   GOALS: Goals reviewed with patient? No  SHORT TERM GOALS: Target date: 02/15/2023   Pt will be compliant and knowledgeable with initial HEP for improved comfort and carryover Baseline: initial HEP given  02/08/23: requires mod cues Goal status: ONGOING  2.  Pt will self report R ankle/foot pain no greater than 5/10 for improved comfort and functional ability Baseline: 7/10 at worst Goal status: ONGOING   LONG TERM GOALS: Target date: 04/19/2023   Pt will improve FOTO function score to no less than 69% as proxy for functional improvement Baseline: 50% function 04/04/2023: 69% function Goal status: MET   2.  Pt will self report R foot/ankle pain no greater than 2/10 for improved comfort and functional ability Baseline: 7/10 at worst Goal status: IN PROGRESS   3.  Pt will increase 30 Second Sit to Stand rep count to no less than 12 reps for improved balance, strength, and functional mobility Baseline: 8 reps  04/04/2023: 10 reps Goal status: IN PROGRESS   4.  Pt will improve R ankle DF AROM to no less 10 degrees for improved gait and decrease pain Baseline: lacking 5 Goal status: IN PROGRESS  5.  Pt will be able be to get back to playing sports with friends not limited by R ankle/foot pain  Baseline: unable Goal status: IN PROGRESS   PLAN:  PT FREQUENCY: 2x/week  PT DURATION: 8  weeks  PLANNED INTERVENTIONS: 97164- PT Re-evaluation, 97110-Therapeutic exercises, 97530- Therapeutic activity, 97112- Neuromuscular re-education, 97535- Self Care, 16109- Manual therapy, [id number]- Gait training, 97014- Electrical stimulation (unattended), [id number]- Electrical stimulation (manual), 97016- Vasopneumatic device, Dry Needling, Cryotherapy, and Moist heat  PLAN FOR NEXT SESSION: review/update HEP PRN, R ankle strength and balance, calf stretching    Eloy End PT  04/04/23 10:18 AM

## 2023-04-06 ENCOUNTER — Other Ambulatory Visit (HOSPITAL_COMMUNITY): Payer: Self-pay

## 2023-04-06 ENCOUNTER — Telehealth: Payer: Self-pay

## 2023-04-06 ENCOUNTER — Ambulatory Visit: Payer: 59 | Admitting: Physical Therapy

## 2023-04-06 NOTE — Telephone Encounter (Signed)
 Pharmacy Patient Advocate Encounter   Received notification from Pt Calls Messages that prior authorization for [employer]  is required/requested.   Insurance verification completed.   The patient is insured through [facility] .   Per test claim: PA required; PA submitted to above mentioned insurance via CoverMyMeds Key/confirmation #/EOC [id number] Status is pending

## 2023-04-07 ENCOUNTER — Ambulatory Visit: Payer: 59 | Admitting: Behavioral Health

## 2023-04-07 DIAGNOSIS — R4589 Other symptoms and signs involving emotional state: Secondary | ICD-10-CM

## 2023-04-07 DIAGNOSIS — F419 Anxiety disorder, unspecified: Secondary | ICD-10-CM

## 2023-04-07 NOTE — Progress Notes (Signed)
   Deanna Lever, LMFT

## 2023-04-07 NOTE — Progress Notes (Signed)
 [facility] Counselor/Therapist Progress Note  Patient ID: Deanna Levine, MRN: [id number],    Date: 04/07/2023  Time Spent: 55 min Caregility video; Pt is in private @ home & Provider working remotely from [employer]. Pt is aware of risks/limitations of telehealth & consents to Tx today . Time In: 3:00pm Time Out: 3;55pm  Treatment Type: Individual Therapy  Reported Symptoms: Reduction in anx/dep & stress  Mental Status Exam: Appearance:  Casual     Behavior: Appropriate and Sharing  Motor: Normal  Speech/Language:  Clear and Coherent  Affect: Appropriate  Mood: normal  Thought process: normal  Thought content:   WNL  Sensory/Perceptual disturbances:   WNL  Orientation: oriented to person, place, time/date, and situation  Attention: Good  Concentration: Good  Memory: WNL  Fund of knowledge:  Good  Insight:   Good  Judgment:  Good  Impulse Control: Good   Risk Assessment: Danger to Self:  No Self-injurious Behavior: No Danger to Others: No Duty to Warn:no Physical Aggression / Violence:No  Access to Firearms a concern: No  Gang Involvement:No   Subjective: Fasting ended well in Jan. Now, the [facility] has decided to do Fasting ea first Tue of the month; 7am is Prayer Call until 7:15, then 7am-3pm is water only & after 3pm they can eat. The diet is not restricted after 3pm. [name] feeling closer to G_d. She prays in the mornings & listens for G_d's voice. He wants blessings for [place].   Pt is keeping her attn on her health & scheduling appts as indicated. [name] keeping up w/her health needs. [name] attending her PT appts regularly for leg & foot work. [name] learning to improve her balance.   Interventions: Psycho-education/Bibliotherapy and Family Systems  Diagnosis:Anxiety  Anxiety about health  Plan: Deanna Levine will cont to monitor her health & keep up w/her healthcare appts. [name] anxious about her financial status w/her Disability per the Trump  Administration. [name] going to observe what happens & try to estb a money stream for herself via [employer].   Target Date: 05/29/2023  Progress: 5  Frequency: Once every 2-3 wks  Modality: [name], LMFT

## 2023-04-11 ENCOUNTER — Ambulatory Visit: Payer: 59

## 2023-04-11 DIAGNOSIS — M6281 Muscle weakness (generalized): Secondary | ICD-10-CM

## 2023-04-11 DIAGNOSIS — M25571 Pain in right ankle and joints of right foot: Secondary | ICD-10-CM | POA: Diagnosis not present

## 2023-04-11 DIAGNOSIS — R2689 Other abnormalities of gait and mobility: Secondary | ICD-10-CM | POA: Diagnosis not present

## 2023-04-11 NOTE — Therapy (Signed)
 OUTPATIENT PHYSICAL THERAPY TREATMENT   Patient Name: Deanna Levine MRN: [id number] DOB:[birth year], 23 y.o., female Today's Date: 04/11/2023  END OF SESSION:  PT End of Session - 04/11/23 0923     Visit Number 11    Number of Visits 24    Date for PT Re-Evaluation 04/19/23    Authorization Type U[employer] Dual Complete    PT Start Time 0930    PT Stop Time 1012    PT Time Calculation (min) 42 min    Activity Tolerance Patient tolerated treatment well    Behavior During Therapy WFL for tasks assessed/performed                      Past Medical History:  Diagnosis Date   Absence seizure disorder (HCC)    Allergy     Colitis 07/16/2021   Complication of anesthesia    slow to wake up from anesthesia, occ HA with anesthesia    Diabetes mellitus without complication (HCC)    type 2   Difficulty swallowing pills    Dyspraxia    Eczema    Global developmental delay    Obesity (BMI 30-39.9)    PONV (postoperative nausea and vomiting)    Precocious female puberty    Seizures (HCC)    last seizure 2012 - no current med   Sleep apnea    White-Sutton syndrome    Past Surgical History:  Procedure Laterality Date   ADENOIDECTOMY     BALLOON DILATION N/A 11/23/2021   Procedure: BALLOON DILATION;  Surgeon: Lindle Rhea, MD;  Location: [facility];  Service: Gastroenterology;  Laterality: N/A;   BIOPSY  07/16/2021   Procedure: BIOPSY;  Surgeon: Lindle Rhea, MD;  Location: [facility] ENDOSCOPY;  Service: Gastroenterology;;   BIOPSY  11/23/2021   Procedure: BIOPSY;  Surgeon: Lindle Rhea, MD;  Location: [facility];  Service: Gastroenterology;;   bone removal Left 01/16/2021   foot   COLONOSCOPY WITH PROPOFOL  N/A 07/16/2021   Procedure: COLONOSCOPY WITH PROPOFOL ;  Surgeon: Lindle Rhea, MD;  Location: [facility] ENDOSCOPY;  Service: Gastroenterology;  Laterality: N/A;   ESOPHAGOGASTRODUODENOSCOPY (EGD) WITH PROPOFOL  N/A 11/23/2021   Procedure:  ESOPHAGOGASTRODUODENOSCOPY (EGD) WITH PROPOFOL ;  Surgeon: Lindle Rhea, MD;  Location: [facility];  Service: Gastroenterology;  Laterality: N/A;   FOOT SURGERY Right 11/2022   GASTROCNEMIUS RECESSION Bilateral 04/25/2020   Procedure: BILATERAL GASTROCNEMIUS RECESSION;  Surgeon: Timothy Ford, MD;  Location: [facility] OR;  Service: Orthopedics;  Laterality: Bilateral;   SUPPRELIN  IMPLANT  05/06/2011   Procedure: SUPPRELIN  IMPLANT;  Surgeon: Melven Stable. Alanda Allegra, MD;  Location: [facility];  Service: Pediatrics;  Laterality: Right;   SUPPRELIN  IMPLANT Right 06/14/2013   Procedure: REMOVAL OF SUPPRELIN  IMPLANT FROM RIGHT UPPER ARM;  Surgeon: Melven Stable. Alanda Allegra, MD;  Location: [facility];  Service: Pediatrics;  Laterality: Right;   TONSILLECTOMY     TONSILLECTOMY AND ADENOIDECTOMY     TYMPANOSTOMY TUBE PLACEMENT     x 2   Patient Active Problem List   Diagnosis Date Noted   Hyperhidrosis 12/30/2021   Dysphagia    OSA (obstructive sleep apnea) 09/30/2021   Bilateral hand numbness 09/15/2021   Acne vulgaris 08/26/2021   Noninfectious gastroenteritis    Bilateral arm pain 06/12/2021   DUB (dysfunctional uterine bleeding) 05/14/2021   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   Other allergic rhinitis 08/20/2020   Allergic conjunctivitis of both eyes 08/20/2020   Encounter for general adult medical examination with abnormal findings 07/03/2020   Leg  length discrepancy 06/05/2020   Hidradenitis suppurativa 06/05/2020   Achilles tendon contracture, bilateral    Elevated sedimentation rate 02/07/2020   Pain in right ankle and joints of right foot 02/07/2020   Anemia 12/29/2018   Type 2 diabetes mellitus (HCC) 08/15/2017   Elevated triglycerides with high cholesterol 12/09/2016   Mild intellectual disability 08/16/2012   Laxity of ligament 08/16/2012   Delayed milestones 08/16/2012   Obesity 06/07/2011   Global developmental delay    Petit mal without grand mal seizures  (HCC)    Dyspraxia     PCP: Adelia Homestead, MD  REFERRING PROVIDER: Floyce Hutching, DPM   REFERRING DIAG: [phone] (ICD-10-CM) - Coalition, calcaneal tarsal   THERAPY DIAG:  Pain in right ankle and joints of right foot  Muscle weakness (generalized)  Other abnormalities of gait and mobility  Rationale for Evaluation and Treatment: Rehabilitation  ONSET DATE: 12/24/2022  SUBJECTIVE:   SUBJECTIVE STATEMENT: Pt presents to PT with continued R ankle pain. Has been compliant with HEP.   PERTINENT HISTORY: DM II, White Sutton syndrome  PAIN:  Are you having pain?  Yes: NPRS scale: 6/10 Worst: 7/10 Pain location: R medial ankle/foot Pain description: achy  Aggravating factors: walking, standing Relieving factors: rest  PRECAUTIONS: None  RED FLAGS: None   WEIGHT BEARING RESTRICTIONS: WBAT R LE  FALLS:  Has patient fallen in last 6 months? No  LIVING ENVIRONMENT: Lives with: lives with their family Lives in: House/apartment Stairs: Yes: Internal: 12 steps; on right going up Has following equipment at home: Single point cane  OCCUPATION: On disability  PLOF: Independent with basic ADLs  PATIENT GOALS: pt would like to get back to playing sports with friends for fun, decrease her foot pain  NEXT MD VISIT: PRN  OBJECTIVE:  Note: Objective measures were completed at Evaluation unless otherwise noted.  DIAGNOSTIC FINDINGS: See imaging  PATIENT SURVEYS:  FOTO: 50% function; 69% predicted 04/04/2023: 69% function  COGNITION: Overall cognitive status: Within functional limits for tasks assessed     SENSATION: WFL  POSTURE: rounded shoulders, forward head, and healing incisional scar on R medial foot  PALPATION: No overt TTP noted  LOWER EXTREMITY ROM:  Active ROM Right eval Right 04/04/23  Hip flexion    Hip extension    Hip abduction    Hip adduction    Hip internal rotation    Hip external rotation    Knee flexion    Knee extension     Ankle dorsiflexion Lacking 5 Lacking 3  Ankle plantarflexion    Ankle inversion    Ankle eversion     (Blank rows = not tested)  LOWER EXTREMITY MMT:  MMT Right eval Left eval  Hip flexion    Hip extension    Hip abduction    Hip adduction    Hip internal rotation    Hip external rotation    Knee flexion    Knee extension    Ankle dorsiflexion 3+/5 5/5  Ankle plantarflexion 3+/5 5/5  Ankle inversion 3+/5 5/5  Ankle eversion 3+/5 5/5   (Blank rows = not tested)  LOWER EXTREMITY SPECIAL TESTS:  DNT  FUNCTIONAL TESTS:  30 Second Sit to Stand: 8 reps  04/04/2023: 10 reps  GAIT: Distance walked: 23ft Assistive device utilized: Single point cane Level of assistance: Complete Independence Comments: antalgic   TREATMENT: [facility] Adult PT Treatment:  Therapeutic Exercise: Nu step L5 LE only x 4 min during subjective Slant board calf stretch 2x45" Standing heel raises 2x15  Single leg press 2x10 R 20# LAQ 3x10 4# R ankle DF 2x15 RTB Neuromuscular Re-Ed: Wobble board 2x45" fwd/bwd Tandem on foam 2x30" R back Side step on toes/heels while on foam beam x 3 laps each at counter  Therapeutic Activity: Standing mini squat - 1 UE support 2x10 for improving ability to lift objects from ground Step ups 3x10 fwd 8in - no UE support R stance STS with 10# DB 2x10 - to improve transfers   PATIENT EDUCATION:  Education details: rationale for interventions, HEP  Person educated: Patient Education method: Explanation, Demonstration, Tactile cues, Verbal cues Education comprehension: verbalized understanding, returned demonstration, verbal cues required, tactile cues required, and needs further education     HOME EXERCISE PROGRAM: Access Code: [email or web address] URL: [email or web address] Date: 02/24/2023 Prepared by: Mayme Spearman  Exercises - Standing Tandem Balance with Counter Support  - 1 x daily - 7 x weekly - 3 sets -  30 sec hold - Long Sitting Calf Stretch with Strap  - 1 x daily - 7 x weekly - 3 reps - 30 sec hold - Towel Scrunches  - 2-3 x daily - 7 x weekly - 15 reps - Heel Toe Raises with Counter Support  - 2-3 x daily - 7 x weekly - 1 sets - 10 reps - Sit to stand with control  - 1 x daily - 7 x weekly - 1 sets - 10 reps - Standing Gastroc Stretch at Counter  - 1 x daily - 7 x weekly - 1 sets - 10 reps - 5 hold - Staggered Stance Forward Backward Weight Shift with Counter Support  - 2-3 x daily - 7 x weekly - 1 sets - 8 reps  ASSESSMENT:  CLINICAL IMPRESSION: Pt was able to complete all prescribed exercises with no adverse effect. Exercises focused on improving R LE and specifically ankle strength and ROM. We also worked on improving overall ability with functional with home ADLs as well as continued work on improving R ankle stability and proprioception. She noted some continued discomfort post session. No changes to HEP. Pt continues to benefit from skilled PT, will continue to progress as able per POC.   EVAL: Patient is a 23 y.o. F who was seen today for physical therapy evaluation and treatment for s/p R foot surgery on 12/24/2022 for middle facet coalition resection. Physical findings are consistent with surgery and recovery timeline as pt demonstrates decrease in R ankle strength, ROM, and balance. FOTO score demonstrates decrease in subjective functional ability below PLOF. Pt would benefit from skilled PT services working on normalizing gait, ankle strength and balance post surgery.   OBJECTIVE IMPAIRMENTS: Abnormal gait, decreased activity tolerance, decreased balance, decreased mobility, difficulty walking, decreased ROM, decreased strength, and pain  ACTIVITY LIMITATIONS: carrying, lifting, standing, squatting, stairs, transfers, and locomotion level  PARTICIPATION LIMITATIONS: meal prep, cleaning, driving, shopping, occupation, and yard work  PERSONAL FACTORS: 1-2 comorbidities: DM II,  White Sutton syndrome  are also affecting patient's functional outcome.   REHAB POTENTIAL: Excellent  CLINICAL DECISION MAKING: Stable/uncomplicated  EVALUATION COMPLEXITY: Low   GOALS: Goals reviewed with patient? No  SHORT TERM GOALS: Target date: 02/15/2023   Pt will be compliant and knowledgeable with initial HEP for improved comfort and carryover Baseline: initial HEP given  02/08/23: requires mod cues Goal status: ONGOING  2.  Pt will self report R ankle/foot  pain no greater than 5/10 for improved comfort and functional ability Baseline: 7/10 at worst Goal status: ONGOING   LONG TERM GOALS: Target date: 04/19/2023   Pt will improve FOTO function score to no less than 69% as proxy for functional improvement Baseline: 50% function 04/04/2023: 69% function Goal status: MET   2.  Pt will self report R foot/ankle pain no greater than 2/10 for improved comfort and functional ability Baseline: 7/10 at worst Goal status: IN PROGRESS   3.  Pt will increase 30 Second Sit to Stand rep count to no less than 12 reps for improved balance, strength, and functional mobility Baseline: 8 reps  04/04/2023: 10 reps Goal status: IN PROGRESS   4.  Pt will improve R ankle DF AROM to no less 10 degrees for improved gait and decrease pain Baseline: lacking 5 Goal status: IN PROGRESS  5.  Pt will be able be to get back to playing sports with friends not limited by R ankle/foot pain  Baseline: unable Goal status: IN PROGRESS   PLAN:  PT FREQUENCY: 2x/week  PT DURATION: 8 weeks  PLANNED INTERVENTIONS: 97164- PT Re-evaluation, 97110-Therapeutic exercises, 97530- Therapeutic activity, 97112- Neuromuscular re-education, 97535- Self Care, 09811- Manual therapy, [id number]- Gait training, 97014- Electrical stimulation (unattended), [id number]- Electrical stimulation (manual), 97016- Vasopneumatic device, Dry Needling, Cryotherapy, and Moist heat  PLAN FOR NEXT SESSION: review/update HEP PRN, R ankle  strength and balance, calf stretching    Ivor Mars PT  04/11/23 10:21 AM

## 2023-04-13 ENCOUNTER — Ambulatory Visit: Payer: 59

## 2023-04-13 DIAGNOSIS — M6281 Muscle weakness (generalized): Secondary | ICD-10-CM

## 2023-04-13 DIAGNOSIS — M25571 Pain in right ankle and joints of right foot: Secondary | ICD-10-CM

## 2023-04-13 DIAGNOSIS — R2689 Other abnormalities of gait and mobility: Secondary | ICD-10-CM | POA: Diagnosis not present

## 2023-04-13 NOTE — Therapy (Signed)
OUTPATIENT PHYSICAL THERAPY TREATMENT/DISCHARGE  PHYSICAL THERAPY DISCHARGE SUMMARY  Visits from Start of Care: 12  Current functional level related to goals / functional outcomes: See goals and objective   Remaining deficits: See goals and objective   Education / Equipment: HEP   Patient agrees to discharge. Patient goals were partially met. Patient is being discharged due to being pleased with the current functional level.   Patient Name: Deanna Levine MRN: [id number] DOB:Jun 30, 2000, 23 y.o., female Today's Date: 04/14/2023  END OF SESSION:  PT End of Session - 04/13/23 0926     Visit Number 12    Number of Visits 24    Date for PT Re-Evaluation 04/19/23    Authorization Type UHC Dual Complete    PT Start Time 0930    PT Stop Time 1010    PT Time Calculation (min) 40 min    Activity Tolerance Patient tolerated treatment well    Behavior During Therapy WFL for tasks assessed/performed                       Past Medical History:  Diagnosis Date   Absence seizure disorder (HCC)    Allergy    Colitis 07/16/2021   Complication of anesthesia    slow to wake up from anesthesia, occ HA with anesthesia    Diabetes mellitus without complication (HCC)    type 2   Difficulty swallowing pills    Dyspraxia    Eczema    Global developmental delay    Obesity (BMI 30-39.9)    PONV (postoperative nausea and vomiting)    Precocious female puberty    Seizures (HCC)    last seizure 2012 - no current med   Sleep apnea    White-Sutton syndrome    Past Surgical History:  Procedure Laterality Date   ADENOIDECTOMY     BALLOON DILATION N/A 11/23/2021   Procedure: BALLOON DILATION;  Surgeon: Tressia Danas, MD;  Location: [facility];  Service: Gastroenterology;  Laterality: N/A;   BIOPSY  07/16/2021   Procedure: BIOPSY;  Surgeon: Tressia Danas, MD;  Location: [facility] ENDOSCOPY;  Service: Gastroenterology;;   BIOPSY  11/23/2021   Procedure: BIOPSY;  Surgeon:  Tressia Danas, MD;  Location: [facility];  Service: Gastroenterology;;   bone removal Left 01/16/2021   foot   COLONOSCOPY WITH PROPOFOL N/A 07/16/2021   Procedure: COLONOSCOPY WITH PROPOFOL;  Surgeon: Tressia Danas, MD;  Location: [facility]COPY;  Service: Gastroenterology;  Laterality: N/A;   ESOPHAGOGASTRODUODENOSCOPY (EGD) WITH PROPOFOL N/A 11/23/2021   Procedure: ESOPHAGOGASTRODUODENOSCOPY (EGD) WITH PROPOFOL;  Surgeon: Tressia Danas, MD;  Location: [facility];  Service: Gastroenterology;  Laterality: N/A;   FOOT SURGERY Right 11/2022   GASTROCNEMIUS RECESSION Bilateral 04/25/2020   Procedure: BILATERAL GASTROCNEMIUS RECESSION;  Surgeon: Nadara Mustard, MD;  Location: [facility] OR;  Service: Orthopedics;  Laterality: Bilateral;   SUPPRELIN IMPLANT  05/06/2011   Procedure: SUPPRELIN IMPLANT;  Surgeon: Judie Petit. Leonia Corona, MD;  Location: [facility];  Service: Pediatrics;  Laterality: Right;   SUPPRELIN IMPLANT Right 06/14/2013   Procedure: REMOVAL OF SUPPRELIN IMPLANT FROM RIGHT UPPER ARM;  Surgeon: Judie Petit. Leonia Corona, MD;  Location: [facility];  Service: Pediatrics;  Laterality: Right;   TONSILLECTOMY     TONSILLECTOMY AND ADENOIDECTOMY     TYMPANOSTOMY TUBE PLACEMENT     x 2   Patient Active Problem List   Diagnosis Date Noted   Hyperhidrosis 12/30/2021   Dysphagia    OSA (obstructive sleep  apnea) 09/30/2021   Bilateral hand numbness 09/15/2021   Acne vulgaris 08/26/2021   Noninfectious gastroenteritis    Bilateral arm pain 06/12/2021   DUB (dysfunctional uterine bleeding) 05/14/2021   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   Other allergic rhinitis 08/20/2020   Allergic conjunctivitis of both eyes 08/20/2020   Encounter for general adult medical examination with abnormal findings 07/03/2020   Leg length discrepancy 06/05/2020   Hidradenitis suppurativa 06/05/2020   Achilles tendon contracture, bilateral    Elevated sedimentation rate 02/07/2020    Pain in right ankle and joints of right foot 02/07/2020   Anemia 12/29/2018   Type 2 diabetes mellitus (HCC) 08/15/2017   Elevated triglycerides with high cholesterol 12/09/2016   Mild intellectual disability 08/16/2012   Laxity of ligament 08/16/2012   Delayed milestones 08/16/2012   Obesity 06/07/2011   Global developmental delay    Petit mal without grand mal seizures (HCC)    Dyspraxia     PCP: Myrlene Broker, MD  REFERRING PROVIDER: Edwin Cap, DPM   REFERRING DIAG: [phone] (ICD-10-CM) - Coalition, calcaneal tarsal   THERAPY DIAG:  Pain in right ankle and joints of right foot  Muscle weakness (generalized)  Other abnormalities of gait and mobility  Rationale for Evaluation and Treatment: Rehabilitation  ONSET DATE: 12/24/2022  SUBJECTIVE:   SUBJECTIVE STATEMENT: Pt presents to PT with reports of continued R ankle pain. Has continued HEP compliance. Pt wants to potentially take a break from PT for a while to see how she does with HEP at home.   PERTINENT HISTORY: DM II, White Sutton syndrome  PAIN:  Are you having pain?  Yes: NPRS scale: 6/10 Worst: 7/10 Pain location: R medial ankle/foot Pain description: achy  Aggravating factors: walking, standing Relieving factors: rest  PRECAUTIONS: None  RED FLAGS: None   WEIGHT BEARING RESTRICTIONS: WBAT R LE  FALLS:  Has patient fallen in last 6 months? No  LIVING ENVIRONMENT: Lives with: lives with their family Lives in: House/apartment Stairs: Yes: Internal: 12 steps; on right going up Has following equipment at home: Single point cane  OCCUPATION: On disability  PLOF: Independent with basic ADLs  PATIENT GOALS: pt would like to get back to playing sports with friends for fun, decrease her foot pain  NEXT MD VISIT: PRN  OBJECTIVE:  Note: Objective measures were completed at Evaluation unless otherwise noted.  DIAGNOSTIC FINDINGS: See imaging  PATIENT SURVEYS:  FOTO: 50%  function; 69% predicted 04/04/2023: 69% function  COGNITION: Overall cognitive status: Within functional limits for tasks assessed     SENSATION: WFL  POSTURE: rounded shoulders, forward head, and healing incisional scar on R medial foot  PALPATION: No overt TTP noted  LOWER EXTREMITY ROM:  Active ROM Right eval Right 04/04/23 Right 04/13/23  Hip flexion     Hip extension     Hip abduction     Hip adduction     Hip internal rotation     Hip external rotation     Knee flexion     Knee extension     Ankle dorsiflexion Lacking 5 Lacking 3 6 degrees  Ankle plantarflexion     Ankle inversion     Ankle eversion      (Blank rows = not tested)  LOWER EXTREMITY MMT:  MMT Right eval Left eval  Hip flexion    Hip extension    Hip abduction    Hip adduction    Hip internal rotation    Hip external rotation  Knee flexion    Knee extension    Ankle dorsiflexion 3+/5 5/5  Ankle plantarflexion 3+/5 5/5  Ankle inversion 3+/5 5/5  Ankle eversion 3+/5 5/5   (Blank rows = not tested)  LOWER EXTREMITY SPECIAL TESTS:  DNT  FUNCTIONAL TESTS:  30 Second Sit to Stand: 8 reps  04/04/2023: 10 reps 04/13/2023: 12 reps  GAIT: Distance walked: 65ft Assistive device utilized: Single point cane Level of assistance: Complete Independence Comments: antalgic   TREATMENT: [facility] Adult PT Treatment:                                                  Therapeutic Exercise: NuStep L5 LE only x 4 min during subjective Ankle DF/Inv/Ev 2x15 RTB Standing heel-toe raises 2x15 Slant board calf stretch 2x45" Single leg press 2x10 R 20# LAQ 3x10 4# R ankle DF 2x15 RTB Tandem on foam 3x30" R back Therapeutic Activity: Standing mini squat - 1 UE support 2x10 for improving ability to lift objects from ground Assessment of tests/measures, goals, and outcomes for discharge   PATIENT EDUCATION:  Education details: rationale for interventions, HEP  Person educated: Patient Education method:  Explanation, Demonstration, Tactile cues, Verbal cues Education comprehension: verbalized understanding, returned demonstration, verbal cues required, tactile cues required, and needs further education     HOME EXERCISE PROGRAM: Access Code: [email or web address] URL: [email or web address] Date: 04/13/2023 Prepared by: Edwinna Areola  Exercises - Ankle Dorsiflexion with Resistance  - 1 x daily - 7 x weekly - 2-3 sets - 15 reps - red band hold - Ankle Inversion with Resistance  - 1 x daily - 7 x weekly - 2-3 sets - 15 reps - red band hold - Ankle Eversion with Resistance  - 1 x daily - 7 x weekly - 2-3 sets - 10 reps - red band hold - Long Sitting Calf Stretch with Strap  - 1 x daily - 7 x weekly - 3 reps - 30 sec hold - Sit to stand with control  - 1 x daily - 7 x weekly - 1 sets - 10 reps - Standing Tandem Balance with Counter Support  - 1 x daily - 7 x weekly - 3 sets - 30 sec hold - Heel Toe Raises with Counter Support  - 1 x daily - 7 x weekly - 2 sets - 15 reps - Standing Gastroc Stretch at Counter  - 1 x daily - 7 x weekly - 2-3 reps - 45 sec hold - Soleus Stretch on Wall  - 1 x daily - 7 x weekly - 2 reps - 45 sec hold - Mini Squat with Counter Support  - 1 x daily - 7 x weekly - 2 sets - 10 reps  ASSESSMENT:  CLINICAL IMPRESSION: Pt was able to complete all prescribed exercises and demonstrated knowledge of HEP with no adverse effect. Over the course of PT treatment she has progressed well, showing improved strength, ROM, and balance of R ankle post surgery. She has met most long term goals and wants to see how she does on her own with an HEP for a while. PT in agreement with this plan, pt ready to discharge from skilled therapy at this time.   EVAL: Patient is a 23 y.o. F who was seen today for physical therapy evaluation and treatment for s/p R foot surgery on 12/24/2022 for middle  facet coalition resection. Physical findings are consistent with surgery and recovery timeline as pt  demonstrates decrease in R ankle strength, ROM, and balance. FOTO score demonstrates decrease in subjective functional ability below PLOF. Pt would benefit from skilled PT services working on normalizing gait, ankle strength and balance post surgery.   OBJECTIVE IMPAIRMENTS: Abnormal gait, decreased activity tolerance, decreased balance, decreased mobility, difficulty walking, decreased ROM, decreased strength, and pain  ACTIVITY LIMITATIONS: carrying, lifting, standing, squatting, stairs, transfers, and locomotion level  PARTICIPATION LIMITATIONS: meal prep, cleaning, driving, shopping, occupation, and yard work  PERSONAL FACTORS: 1-2 comorbidities: DM II, White Sutton syndrome  are also affecting patient's functional outcome.   REHAB POTENTIAL: Excellent  CLINICAL DECISION MAKING: Stable/uncomplicated  EVALUATION COMPLEXITY: Low   GOALS: Goals reviewed with patient? No  SHORT TERM GOALS: Target date: 02/15/2023   Pt will be compliant and knowledgeable with initial HEP for improved comfort and carryover Baseline: initial HEP given  02/08/23: requires mod cues Goal status: MET  2.  Pt will self report R ankle/foot pain no greater than 5/10 for improved comfort and functional ability Baseline: 7/10 at worst Goal status: NOT MET  LONG TERM GOALS: Target date: 04/19/2023   Pt will improve FOTO function score to no less than 69% as proxy for functional improvement Baseline: 50% function 04/04/2023: 69% function Goal status: MET   2.  Pt will self report R foot/ankle pain no greater than 2/10 for improved comfort and functional ability Baseline: 7/10 at worst 04/13/2023: 7/10 Goal status: NOT MET   3.  Pt will increase 30 Second Sit to Stand rep count to no less than 12 reps for improved balance, strength, and functional mobility Baseline: 8 reps  04/04/2023: 10 reps 04/13/2023: 12 reps Goal status: MET   4.  Pt will improve R ankle DF AROM to no less 10 degrees for improved gait  and decrease pain Baseline: lacking 5 04/13/2023: 6 deg Goal status: PARTIALLY MET  5.  Pt will be able be to get back to playing sports with friends not limited by R ankle/foot pain  Baseline: unable Goal status: NOT MET   PLAN:  PT FREQUENCY: 2x/week  PT DURATION: 8 weeks  PLANNED INTERVENTIONS: 97164- PT Re-evaluation, 97110-Therapeutic exercises, 97530- Therapeutic activity, 97112- Neuromuscular re-education, 97535- Self Care, 16109- Manual therapy, [id number]- Gait training, 97014- Electrical stimulation (unattended), [id number]- Electrical stimulation (manual), 97016- Vasopneumatic device, Dry Needling, Cryotherapy, and Moist heat  PLAN FOR NEXT SESSION: review/update HEP PRN, R ankle strength and balance, calf stretching    Eloy End PT  04/14/23 10:21 AM

## 2023-04-22 ENCOUNTER — Ambulatory Visit: Payer: Medicaid Other | Admitting: Neurology

## 2023-04-26 ENCOUNTER — Other Ambulatory Visit: Payer: Self-pay

## 2023-04-26 MED ORDER — TIRZEPATIDE 12.5 MG/0.5ML ~~LOC~~ SOAJ: 12.5000 mg | SUBCUTANEOUS | 2 refills | Status: DC

## 2023-04-27 NOTE — Telephone Encounter (Signed)
 Pharmacy Patient Advocate Encounter  Received notification from [facility] that Prior Authorization for Deanna Levine has been APPROVED through 02/29/24   PA #/Case ID/Reference #:  [id number]

## 2023-04-28 ENCOUNTER — Ambulatory Visit: Payer: 59 | Admitting: Behavioral Health

## 2023-04-28 DIAGNOSIS — F419 Anxiety disorder, unspecified: Secondary | ICD-10-CM | POA: Diagnosis not present

## 2023-04-28 NOTE — Progress Notes (Signed)
 [facility] Counselor/Therapist Progress Note  Patient ID: Deanna Levine, MRN: [id number],    Date: 04/28/2023  Time Spent: 55 min Caregility video: Pt is home in private & Provider working remotely from [employer]. Pt is aware of the risks/limitations of telehealth & consents to Tx today.  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  Mental Status Exam: Appearance:  Casual     Behavior: Appropriate and Sharing  Motor: Normal  Speech/Language:  Clear and Coherent and Normal Rate  Affect: Appropriate  Mood: normal  Thought process: normal  Thought content:   WNL  Sensory/Perceptual disturbances:   WNL  Orientation: oriented to person, place, time/date, and situation  Attention: Good  Concentration: Good  Memory: WNL  Fund of knowledge:  Good  Insight:   Fair  Judgment:  Fair  Impulse Control: Good   Risk Assessment: Danger to Self:  No Self-injurious Behavior: No Danger to Others: No Duty to Warn:no Physical Aggression / Violence:No  Access to Firearms a concern: No  Gang Involvement:No   Subjective: Pt is up to her normal activities wkly. [name] immersed in [id number] videos for [employer]. She has 2 friends who also do this for [employer]. It takes a lot of time to do the videos from 8a-5p daily. [name] hoping to get a steady stream of money.    Pt is healing from foot surgery. [name] trying to take it easy so she can get full functioning back as quickly as possible. [name] trying to work in the home & w/her Parents cleaning currently.   She will be attending a Cousin Mtg of the [employer] on March 8th. Pt wants to stay home & take care of her business. She does not want any invlmnt in the Family drama.  Interventions: Interpersonal  Diagnosis: Anxiety  Plan: Marisella will keep her expectations of herself realistic with the  Video business. She will fllw the [employer]; it includes a moratorium on spending from [place], [employer], & [employer] to participate in a 24hr protest for [employer]. Shaunie wants to participate along w/her Family.   Target Date: 05/14/2023  Progress: 6  Frequency: Once every 2-3 wks  Modality: Claretta Fraise, LMFT

## 2023-04-28 NOTE — Progress Notes (Signed)
   Deneise Lever, LMFT

## 2023-05-12 ENCOUNTER — Other Ambulatory Visit: Payer: Self-pay | Admitting: Podiatry

## 2023-05-16 ENCOUNTER — Other Ambulatory Visit: Payer: Self-pay | Admitting: Pulmonary Disease

## 2023-05-16 DIAGNOSIS — J301 Allergic rhinitis due to pollen: Secondary | ICD-10-CM

## 2023-05-19 ENCOUNTER — Ambulatory Visit: Payer: 59 | Admitting: Behavioral Health

## 2023-05-19 ENCOUNTER — Ambulatory Visit: Payer: 59 | Admitting: "Endocrinology

## 2023-05-19 DIAGNOSIS — F419 Anxiety disorder, unspecified: Secondary | ICD-10-CM | POA: Diagnosis not present

## 2023-05-19 NOTE — Progress Notes (Signed)
   Deanna Lever, LMFT

## 2023-05-19 NOTE — Progress Notes (Signed)
 [facility] Counselor/Therapist Progress Note  Patient ID: Deanna Levine, MRN: [id number],    Date: 05/19/2023  Time Spent: 30 min on Caregility video; Pt is home in private & Provider working remotely from [employer]. Pt is aware of the risks/limitations of telehealth & consents to Tx today.   Time In: 2:00pm Time Out: 2:30pm  Treatment Type: Individual Therapy  Reported Symptoms: Reduction in anx/dep & stress  Mental Status Exam: Appearance:  Casual     Behavior: Appropriate and Sharing  Motor: Normal  Speech/Language:  Clear and Coherent  Affect: Appropriate  Mood: normal  Thought process: concrete  Thought content:   WNL  Sensory/Perceptual disturbances:   WNL  Orientation: oriented to person, place, time/date, and situation  Attention: Good  Concentration: Good  Memory: WNL  Fund of knowledge:  Good  Insight:   Poor  Judgment:  Fair  Impulse Control: Fair   Risk Assessment: Danger to Self:  No Self-injurious Behavior: No Danger to Others: No Duty to Warn:no Physical Aggression / Violence:No  Access to Firearms a concern: No  Gang Involvement:No   Subjective: Pt is doing well, gaining her independence & caring for her Levine. [name] keeping up w/her Healthcare appts.  [name] trying to plan for a trip w/her [name] to travel to [name]. She does want to avoid the Family drama.   She cleaned on her Bday & helped her Parents. Got special food from [name] Tuesday.    Interventions: Narrative and Psycho-education/Bibliotherapy  Diagnosis:Anxiety  Plan: [name] will cont to care for herself & enjoy her new activities. [name] happy for her [name] next year on a Sunday-to attend & enjoy Church. [name] in an upbeat mood. She has created her own map for the [name].  Target Date: 06/29/2023  Progress: 5  Frequency: Once monthly  Modality: Claretta Fraise, LMFT

## 2023-05-23 ENCOUNTER — Encounter: Payer: Self-pay | Admitting: Podiatry

## 2023-05-23 ENCOUNTER — Ambulatory Visit (INDEPENDENT_AMBULATORY_CARE_PROVIDER_SITE_OTHER): Payer: 59 | Admitting: Podiatry

## 2023-05-23 ENCOUNTER — Ambulatory Visit (INDEPENDENT_AMBULATORY_CARE_PROVIDER_SITE_OTHER)

## 2023-05-23 DIAGNOSIS — Q6689 Other  specified congenital deformities of feet: Secondary | ICD-10-CM | POA: Diagnosis not present

## 2023-05-23 NOTE — Progress Notes (Signed)
  Subjective:  Patient ID: Jameya Pontiff, female    DOB: 2000-08-31,  MRN: [id number]  Chief Complaint  Patient presents with   Routine Post Op    Patient states that she has had some pain around the area where patient had surgery no other complaints patient states. Patient is taking medication for pain.    DOS: 12/24/2022 Procedure: Middle facet coalition resection  23 y.o. female returns for post-op check.  She returns for follow-up has occasionally had to wear her boot.  She had a lift applied to her shoe which has helped some.  She and her parents are like she walks towards the outside edge of the foot.  Review of Systems: Negative except as noted in the HPI. Denies N/V/F/Ch.   Objective:  There were no vitals filed for this visit. There is no height or weight on file to calculate BMI. Constitutional Well developed. Well nourished.  Vascular Foot warm and well perfused. Capillary refill normal to all digits.  Calf is soft and supple, no posterior calf or knee pain, negative Homans' sign  Neurologic Normal speech. Oriented to person, place, and time. Epicritic sensation to light touch grossly present bilaterally.  Dermatologic Incision well healed  Orthopedic: No pain along the incision.  Some limited range of motion of the subtalar joint due to guarding.  5 out of 5 strength in dorsiflexion plantarflexion inversion eversion with resistance without pain.   Multiple view plain film radiographs: Unchanged appearance and alignment Assessment:   1. Coalition, calcaneal tarsal    Plan:  Patient was evaluated and treated and all questions answered.  S/p foot surgery right -Still not recommend further intervention at this point, her left foot for quite some time to reach a tolerable level.  I discussed with her she should continue activity and shoe gear as tolerated and focus on range of motion and minimize use of boot encouraged her to continue walking and to try to forcibly  pronate and push the arch third to the ground she walks for me today in the office and this does seem to be due to some guarding.  She has completed physical therapy.  I would like to see her back in 6 months to reevaluate hopefully should turn a corner at some point.  Long-term if this is not improving or worsening then subtalar arthrodesis would be recommended.  Return in about 6 months (around 11/23/2023) for surgery follow up with new xrays (including calc axial).

## 2023-05-24 ENCOUNTER — Encounter: Payer: Self-pay | Admitting: Podiatry

## 2023-05-24 ENCOUNTER — Ambulatory Visit (INDEPENDENT_AMBULATORY_CARE_PROVIDER_SITE_OTHER): Payer: Medicaid Other | Admitting: Podiatry

## 2023-05-24 VITALS — Ht 64.0 in | Wt 188.0 lb

## 2023-05-24 DIAGNOSIS — M2141 Flat foot [pes planus] (acquired), right foot: Secondary | ICD-10-CM

## 2023-05-24 DIAGNOSIS — M79674 Pain in right toe(s): Secondary | ICD-10-CM | POA: Diagnosis not present

## 2023-05-24 DIAGNOSIS — B353 Tinea pedis: Secondary | ICD-10-CM

## 2023-05-24 DIAGNOSIS — M79675 Pain in left toe(s): Secondary | ICD-10-CM

## 2023-05-24 DIAGNOSIS — M2142 Flat foot [pes planus] (acquired), left foot: Secondary | ICD-10-CM | POA: Diagnosis not present

## 2023-05-24 DIAGNOSIS — B351 Tinea unguium: Secondary | ICD-10-CM | POA: Diagnosis not present

## 2023-05-24 DIAGNOSIS — E119 Type 2 diabetes mellitus without complications: Secondary | ICD-10-CM

## 2023-05-24 MED ORDER — KETOCONAZOLE 2 % EX CREA: TOPICAL_CREAM | CUTANEOUS | 1 refills | Status: AC

## 2023-05-24 NOTE — Patient Instructions (Signed)
 To prevent reinfection, spray shoes with lysol every evening.  Clean tub or shower with bleach based cleanser.  Athlete's Foot Athlete's foot (tinea pedis) is a fungal infection of the skin on your feet. It often occurs on the skin that is between or underneath the toes. It can also occur on the soles of your feet. The infection can spread from person to person (is contagious). It can also spread when a person's bare feet come in contact with the fungus on shower floors or on items such as shoes. What are the causes? This condition is caused by a fungus that grows in warm, moist places. You can get athlete's foot by sharing shoes, shower stalls, towels, and wet floors with someone who is infected. Not washing your feet or changing your socks often enough can also lead to athlete's foot. What increases the risk? This condition is more likely to develop in: Men. People who have a weak body defense system (immune system). People who have diabetes. People who use public showers, such as at a gym. People who wear heavy-duty shoes, such as industrial or military shoes. Seasons with warm, humid weather. What are the signs or symptoms? Symptoms of this condition include: Itchy areas between your toes or on the soles of your feet. White, flaky, or scaly areas between your toes or on the soles of your feet. Very itchy small blisters between your toes or on the soles of your feet. Small cuts in your skin. These cuts can become infected. Thick or discolored toenails. How is this diagnosed? This condition may be diagnosed with a physical exam and a review of your medical history. Your health care provider may also take a skin or toenail sample to examine under a microscope. How is this treated? This condition is treated with antifungal medicines. These may be applied as powders, ointments, or creams. In severe cases, an oral antifungal medicine may be given. Follow these instructions at  home: Medicines Apply or take over-the-counter and prescription medicines only as told by your health care provider. Apply your antifungal medicine as told by your health care provider. Do not stop using the antifungal even if your condition improves. Foot care Do not scratch your feet. Keep your feet dry: Wear cotton or wool socks. Change your socks every day or if they become wet. Wear shoes that allow air to flow, such as sandals or canvas tennis shoes. Wash and dry your feet, including the area between your toes. Also, wash and dry your feet: Every day or as told by your health care provider. After exercising. General instructions Do not let others use towels, shoes, nail clippers, or other personal items that touch your feet. Protect your feet by wearing sandals in wet areas, such as locker rooms and shared showers. Keep all follow-up visits. This is important. If you have diabetes, keep your blood sugar under control. Contact a health care provider if: You have a fever. You have swelling, soreness, warmth, or redness in your foot. Your feet are not getting better with treatment. Your symptoms get worse. You have new symptoms. You have severe pain. Summary Athlete's foot (tinea pedis) is a fungal infection of the skin on your feet. It often occurs on skin that is between or underneath the toes. This condition is caused by a fungus that grows in warm, moist places. Symptoms include white, flaky, or scaly areas between your toes or on the soles of your feet. This condition is treated with antifungal medicines.   Keep your feet clean. Always dry them thoroughly. This information is not intended to replace advice given to you by your health care provider. Make sure you discuss any questions you have with your health care provider. Document Revised: 06/08/2020 Document Reviewed: 06/08/2020 Elsevier Patient Education  2024 Elsevier Inc.  

## 2023-05-26 ENCOUNTER — Ambulatory Visit: Payer: 59 | Admitting: "Endocrinology

## 2023-05-29 NOTE — Progress Notes (Signed)
 ANNUAL DIABETIC FOOT EXAM  Subjective: Deanna Levine presents today for annual diabetic foot exam. [name] s/p middle facet coalition resection of right foot by Dr. Lilian Kapur. Chief Complaint  Patient presents with   [facility]    [name] here for diabetic nail trim, PCP is dr Okey Dupre, she seen her last year, last A1C was 5.1.   Patient confirms h/o diabetes.  Patient denies any h/o foot wounds.  Deanna Broker, MD is patient's PCP.  Past Medical History:  Diagnosis Date   Absence seizure disorder (HCC)    Allergy    Colitis 07/16/2021   Complication of anesthesia    slow to wake up from anesthesia, occ HA with anesthesia    Diabetes mellitus without complication (HCC)    type 2   Difficulty swallowing pills    Dyspraxia    Eczema    Global developmental delay    Obesity (BMI 30-39.9)    PONV (postoperative nausea and vomiting)    Precocious female puberty    Seizures (HCC)    last seizure 2012 - no current med   Sleep apnea    White-Sutton syndrome    Patient Active Problem List   Diagnosis Date Noted   Intrinsic atopic dermatitis 02/16/2023   Encounter for long-term (current) use of high-risk medication 08/04/2022   Hyperhidrosis 12/30/2021   Dysphagia    OSA (obstructive sleep apnea) 09/30/2021   Bilateral hand numbness 09/15/2021   Acne vulgaris 08/26/2021   Noninfectious gastroenteritis    Bilateral arm pain 06/12/2021   DUB (dysfunctional uterine bleeding) 05/14/2021   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   Other allergic rhinitis 08/20/2020   Allergic conjunctivitis of both eyes 08/20/2020   Encounter for general adult medical examination with abnormal findings 07/03/2020   Leg length discrepancy 06/05/2020   Hidradenitis suppurativa 06/05/2020   Achilles tendon contracture, bilateral    Elevated sedimentation rate 02/07/2020   Pain in right ankle and joints of right foot 02/07/2020   Anemia 12/29/2018   Type 2 diabetes mellitus (HCC) 08/15/2017    Elevated triglycerides with high cholesterol 12/09/2016   Mild intellectual disability 08/16/2012   Laxity of ligament 08/16/2012   Delayed milestones 08/16/2012   Obesity 06/07/2011   Global developmental delay    Petit mal without grand mal seizures (HCC)    Dyspraxia    Past Surgical History:  Procedure Laterality Date   ADENOIDECTOMY     BALLOON DILATION N/A 11/23/2021   Procedure: BALLOON DILATION;  Surgeon: Tressia Danas, MD;  Location: [facility] ENDOSCOPY;  Service: Gastroenterology;  Laterality: N/A;   BIOPSY  07/16/2021   Procedure: BIOPSY;  Surgeon: Tressia Danas, MD;  [facility] ENDOSCOPY;  Service: Gastroenterology;;   BIOPSY  11/23/2021   Procedure: BIOPSY;  Surgeon: Tressia Danas, MD;  Location: [facility] ENDOSCOPY;  Service: Gastroenterology;;   bone removal Left 01/16/2021   foot   COLONOSCOPY WITH PROPOFOL N/A 07/16/2021   Procedure: COLONOSCOPY WITH PROPOFOL;  Surgeon: Tressia Danas, MD;  Location: [facility] ENDOSCOPY;  Service: Gastroenterology;  Laterality: N/A;   ESOPHAGOGASTRODUODENOSCOPY (EGD) WITH PROPOFOL N/A 11/23/2021   Procedure: ESOPHAGOGASTRODUODENOSCOPY (EGD) WITH PROPOFOL;  Surgeon: Tressia Danas, MD;  Location: [facility] ENDOSCOPY;  Service: Gastroenterology;  Laterality: N/A;   FOOT SURGERY Right 11/2022   GASTROCNEMIUS RECESSION Bilateral 04/25/2020   Procedure: BILATERAL GASTROCNEMIUS RECESSION;  Surgeon: Nadara Mustard, MD;  Location: [facility] OR;  Service: Orthopedics;  Laterality: Bilateral;   SUPPRELIN IMPLANT  05/06/2011   Procedure: SUPPRELIN IMPLANT;  Surgeon: Judie Petit. Leonia Corona, MD;  Location: [facility];  Service: Pediatrics;  Laterality: Right;   SUPPRELIN IMPLANT Right 06/14/2013   Procedure: REMOVAL OF SUPPRELIN IMPLANT FROM RIGHT UPPER ARM;  Surgeon: Judie Petit. Leonia Corona, MD;  Location: [facility];  Service: Pediatrics;  Laterality: Right;   TONSILLECTOMY     TONSILLECTOMY AND ADENOIDECTOMY     TYMPANOSTOMY TUBE  PLACEMENT     x 2   Current Outpatient Medications on File Prior to Visit  Medication Sig Dispense Refill   azelastine (ASTELIN) 0.1 % nasal spray Place 2 sprays into both nostrils 2 (two) times daily. Use in each nostril as directed     Blood Glucose Monitoring Suppl DEVI 1 each by Does not apply route in the morning, at noon, and at bedtime. May substitute to any manufacturer covered by patient's insurance. 1 each 0   cetirizine (ZYRTEC) 10 MG tablet Take 10 mg by mouth daily as needed for allergies.     chlorhexidine (HIBICLENS) 4 % external liquid Apply 1 Application topically daily as needed.     clindamycin-benzoyl peroxide (BENZACLIN) gel Apply 1 Application topically 2 (two) times daily.     Continuous Glucose Sensor (DEXCOM G7 SENSOR) MISC 1 Device by Does not apply route continuous. 9 each 0   Fiber Adult Gummies 2 g CHEW Chew 1 tablet by mouth daily.     fluticasone (FLONASE) 50 MCG/ACT nasal spray SHAKE LIQUID AND USE 2 SPRAYS IN EACH NOSTRIL DAILY 48 g 1   glucose blood (ACCU-CHEK GUIDE TEST) test strip TEST IN THE MORNING, AT NOON, AND AT BEDTIME 100 strip 5   meloxicam (MOBIC) 15 MG tablet TAKE 1 TABLET(15 MG) BY MOUTH DAILY 30 tablet 3   MENAQUINONE-7 PO Take 100 mcg by mouth daily.     Na Sulfate-K Sulfate-Mg Sulf 17.5-3.13-1.6 GM/177ML SOLN Take 177 mLs by mouth See admin instructions.     ondansetron (ZOFRAN-ODT) 4 MG disintegrating tablet Take 1 tablet (4 mg total) by mouth every 8 (eight) hours as needed for nausea or vomiting. 20 tablet 0   Propylene Glycol (SYSTANE BALANCE OP) Apply to eye.     tirzepatide (MOUNJARO) 12.5 MG/0.5ML Pen Inject 12.5 mg into the skin once a week. 2 mL 2   tirzepatide (MOUNJARO) 12.5 MG/0.5ML Pen Inject 12.5 mg into the skin once a week. 7 mL 2   triamcinolone cream (KENALOG) 0.1 % Apply to rough area of feet twice daily. 454 g 0   zonisamide (ZONEGRAN) 100 MG capsule TAKE 2 CAPSULES BY MOUTH DAILY 180 capsule 3   No current  facility-administered medications on file prior to visit.    No Known Allergies Social History   Occupational History   Occupation: Forensic scientist  Tobacco Use   Smoking status: Never    Passive exposure: Never   Smokeless tobacco: Never  Vaping Use   Vaping status: Never Used  Substance and Sexual Activity   Alcohol use: No    Alcohol/week: 0.0 standard drinks of alcohol   Drug use: No   Sexual activity: Never    Birth control/protection: None   Family History  Problem Relation Age of Onset   Diabetes Mother        steroid induced; hx. colitis   Anesthesia problems Mother        severe headache lasting 2-3 days   Rheum arthritis Mother    Ulcerative colitis Mother    Fibromyalgia Mother    Henoch-Schonlein purpura Brother        in  remission   Hypertension Maternal Grandmother    Hyperlipidemia Maternal Grandmother    Diabetes Maternal Grandmother    Kidney disease Maternal Grandfather    Hypertension Maternal Grandfather    Heart disease Maternal Grandfather    Hyperlipidemia Maternal Grandfather    Kidney disease Paternal Grandmother    Hypertension Paternal Grandmother    Diabetes Paternal Grandmother    Heart disease Paternal Grandmother    Hyperlipidemia Paternal Grandmother    Hypertension Paternal Grandfather    Hyperlipidemia Paternal Grandfather    Asthma Maternal Aunt    Seizures Maternal Uncle    Colon cancer Other        great aunt   Esophageal cancer Neg Hx    Esophageal varices Neg Hx    Pancreatic disease Neg Hx    Immunization History  Administered Date(s) Administered   Influenza, Seasonal, Injecte, Preservative Fre 11/18/2022   Influenza,inj,Quad PF,6+ Mos 12/18/2018, 11/11/2021   Meningococcal B, OMV 09/20/2016, 10/27/2016   PFIZER(Purple Top)SARS-COV-2 Vaccination 05/15/2019, 06/07/2019     Review of Systems: Negative except as noted in the HPI.   Objective: There were no vitals filed for this visit.  Monay Houlton is a pleasant  23 y.o. female in NAD. AAO X 3.  Diabetic foot exam was performed with the following findings:   Vascular Examination: Capillary refill time immediate b/l. Vascular status intact b/l with palpable pedal pulses. Pedal hair present b/l. No pain with calf compression b/l. Skin temperature gradient WNL b/l. No cyanosis or clubbing b/l. No ischemia or gangrene noted b/l.   Neurological Examination: Sensation grossly intact b/l with 10 gram monofilament. Vibratory sensation intact b/l.   Dermatological Examination: Pedal skin with normal turgor, texture and tone b/l.  No open wounds. No interdigital macerations.   Toenails 1-5 b/l thick, discolored, elongated with subungual debris and pain on dorsal palpation.   Well healed surgical scar(s) noted right ankle. Diffuse scaling noted peripherally and plantarly b/l feet.  No interdigital macerations.  No blisters, no weeping. No signs of secondary bacterial infection noted.  Musculoskeletal Examination: Muscle strength 5/5 to all lower extremity muscle groups bilaterally. Pes planus deformity noted bilateral LE.  Radiographs: None     Lab Results  Component Value Date   HGBA1C 5.1 08/31/2022   ADA Risk Categorization: Low Risk :  Patient has all of the following: Intact protective sensation No prior foot ulcer  No severe deformity Pedal pulses present  Assessment: 1. Pain due to onychomycosis of toenails of both feet   2. Tinea pedis of both feet   3. Pes planus of both feet   4. Type 2 diabetes mellitus without complication, without long-term current use of insulin (HCC)   5. Encounter for diabetic foot exam (HCC)     Plan: Meds ordered this encounter  Medications   ketoconazole (NIZORAL) 2 % cream    Sig: Apply to both feet and between toes once daily for 6 weeks.    Dispense:  60 g    Refill:  1   Diabetic foot examination performed today. All patient's and/or POA's questions/concerns addressed on today's visit. Toenails 1-5  debrided in length and girth without incident. Continue foot and shoe inspections daily. Monitor blood glucose per PCP/Endocrinologist's recommendations. Continue soft, supportive shoe gear daily. Report any pedal injuries to medical professional.  -For tinea pedis, Rx sent to pharmacy for Ketoconazole Cream 2% to be applied once daily for six weeks. -Patient/POA to call should there be question/concern in the interim. Return in  about 3 months (around 08/24/2023).  Freddie Breech, DPM      [place] LOCATION: [address], [place] [address]                   Office [phone]   [facility] LOCATION: [address] [place], [place] [address] Office [phone]

## 2023-06-16 ENCOUNTER — Ambulatory Visit (INDEPENDENT_AMBULATORY_CARE_PROVIDER_SITE_OTHER): Admitting: Behavioral Health

## 2023-06-16 DIAGNOSIS — F419 Anxiety disorder, unspecified: Secondary | ICD-10-CM | POA: Diagnosis not present

## 2023-06-16 NOTE — Progress Notes (Signed)
   Deneise Lever, LMFT

## 2023-06-16 NOTE — Progress Notes (Signed)
 [facility] Counselor/Therapist Progress Note  Patient ID: Deanna Levine, MRN: [id number],    Date: 06/16/2023  Time Spent: 40 min Caregility video; Pt is home in private & Provider working remotely from [employer]. Pt is aware of risks/limitations of telehealth & consents to Tx today. Time In: 1:00pm Time Out: 1:40pm  Treatment Type: Individual Therapy  Reported Symptoms: Pain levels due to foot surgery are not well-controlled. Sleep is disrupted due to this pain level.   Mental Status Exam: Appearance:  Casual     Behavior: Appropriate and Sharing  Motor: Normal  Speech/Language:  Clear and Coherent  Affect: Appropriate  Mood: normal  Thought process: normal  Thought content:   WNL  Sensory/Perceptual disturbances:   WNL  Orientation: oriented to person, place, time/date, and situation  Attention: Good  Concentration: Good  Memory: WNL  Fund of knowledge:  Good  Insight:   Good  Judgment:  Good  Impulse Control: Good   Risk Assessment: Danger to Self:  No Self-injurious Behavior: No Danger to Others: No Duty to Warn:no Physical Aggression / Violence:No  Access to Firearms a concern: No  Gang Involvement:No   Subjective: Pt is concerned for her Mother's foot surgery recovery. [name] helping Mom w/this. Her Mother is going for a RV to the Surgeon next week. She will consult w/him since it is the same Surgeon.   [name] navigating her own healthcare in a positive way. She keeps her Journal for personal thoughts. This may help others. She wants to help & connect/inspire others. She wants to focus on exer & losing weight as her goals. She may need a Referral from her PCP. [name] watching her diet. Her exer routine is limited due to her recent foot surgery.   Pt is helping around the house more since Mother is in rehabilitating. [name] helping her Dad clean Bldgs on the wknd. It takes longer, but she is doing her best.   Interventions: Family Systems,  encouragement & affirmations for her efforts.  Diagnosis:Anxiety  Plan: Monserratt will listen to her [place] music, playing her games online, watching online videos, & keeping track of her health. [name] doing a lot on the Home Front to assist her Parents w/the everyday & things that happen unexpectedly. She will cont these practices going forward.   Target Date: 06/30/2023  Progress: 6  Frequency: Once monthly  Modality: [name], LMFT

## 2023-06-17 ENCOUNTER — Encounter: Payer: Self-pay | Admitting: Podiatry

## 2023-07-01 ENCOUNTER — Encounter: Payer: Self-pay | Admitting: "Endocrinology

## 2023-07-01 ENCOUNTER — Ambulatory Visit (INDEPENDENT_AMBULATORY_CARE_PROVIDER_SITE_OTHER): Admitting: "Endocrinology

## 2023-07-01 VITALS — BP 106/80 | HR 98 | Ht 64.0 in | Wt 186.0 lb

## 2023-07-01 DIAGNOSIS — E119 Type 2 diabetes mellitus without complications: Secondary | ICD-10-CM

## 2023-07-01 DIAGNOSIS — E7800 Pure hypercholesterolemia, unspecified: Secondary | ICD-10-CM | POA: Diagnosis not present

## 2023-07-01 DIAGNOSIS — Z7985 Long-term (current) use of injectable non-insulin antidiabetic drugs: Secondary | ICD-10-CM

## 2023-07-01 LAB — COMPREHENSIVE METABOLIC PANEL WITH GFR
AG Ratio: 1.7 (calc) (ref 1.0–2.5)
ALT: 8 U/L (ref 6–29)
AST: 12 U/L (ref 10–30)
Albumin: 4.8 g/dL (ref 3.6–5.1)
Alkaline phosphatase (APISO): 70 U/L (ref 31–125)
BUN: 13 mg/dL (ref 7–25)
CO2: 22 mmol/L (ref 20–32)
Calcium: 9.7 mg/dL (ref 8.6–10.2)
Chloride: 107 mmol/L (ref 98–110)
Creat: 0.82 mg/dL (ref 0.50–0.96)
Globulin: 2.9 g/dL (ref 1.9–3.7)
Glucose, Bld: 94 mg/dL (ref 65–99)
Potassium: 4 mmol/L (ref 3.5–5.3)
Sodium: 137 mmol/L (ref 135–146)
Total Bilirubin: 0.6 mg/dL (ref 0.2–1.2)
Total Protein: 7.7 g/dL (ref 6.1–8.1)
eGFR: 103 mL/min/{1.73_m2} (ref >=60)

## 2023-07-01 LAB — MICROALBUMIN / CREATININE URINE RATIO
Creatinine, Urine: 141 mg/dL (ref 20–275)
Microalb, Ur: <0.2 mg/dL

## 2023-07-01 LAB — LIPID PANEL
Cholesterol: 146 mg/dL (ref <200)
HDL: 41 mg/dL — ABNORMAL LOW (ref >=50)
LDL Cholesterol (Calc): 91 mg/dL
Non-HDL Cholesterol (Calc): 105 mg/dL (ref <130)
Total CHOL/HDL Ratio: 3.6 (calc) (ref <5.0)
Triglycerides: 48 mg/dL (ref <150)

## 2023-07-01 LAB — POCT GLYCOSYLATED HEMOGLOBIN (HGB A1C): Hemoglobin A1C: 5.2 % (ref 4.0–5.6)

## 2023-07-01 MED ORDER — TIRZEPATIDE 15 MG/0.5ML ~~LOC~~ SOAJ: 15.0000 mg | SUBCUTANEOUS | 3 refills | Status: DC

## 2023-07-01 NOTE — Patient Instructions (Signed)

## 2023-07-01 NOTE — Progress Notes (Signed)
 Outpatient Endocrinology Note Jorge Newcomer, MD  07/01/23   Deanna Levine [birth year] [id number]  Referring Provider: Adelia Homestead, * Primary Care Provider: Adelia Homestead, MD Reason for consultation: Subjective   Assessment & Plan  Diagnoses and all orders for this visit:  Controlled type 2 diabetes mellitus without complication, without long-term current use of insulin  (HCC) -     POCT glycosylated hemoglobin (Hb A1C) -     Microalbumin / creatinine urine ratio -     Lipid panel -     Comprehensive metabolic panel with GFR  Long-term (current) use of injectable non-insulin  antidiabetic drugs  Pure hypercholesterolemia -     Microalbumin / creatinine urine ratio -     Lipid panel -     Comprehensive metabolic panel with GFR  Other orders -     tirzepatide  (MOUNJARO ) 15 MG/0.5ML Pen; Inject 15 mg into the skin once a week.     Diabetes Type II with no known complications  Lab Results  Component Value Date   GFR 118.55 11/18/2022   Hba1c goal less than 7, current Hba1c is  Lab Results  Component Value Date   HGBA1C 5.2 07/01/2023   Will recommend the following: Mounjaro  15 mg weekly, patient is not keen on 15 mg due to concern over S/E that may happen. Discussed concerns, patient will let us  know if she ever wanted to dose escalate  DexCom not covered Pt got his BG meter and supplies but reports not knowing how to check BG Instructed to come to clinic to learn how to check BG  No known contraindications to any of above medications No history of MEN syndrome/medullary thyroid  cancer/pancreatitis or pancreatic cancer in self or family   -Last LD and Tg are as follows: Lab Results  Component Value Date   LDLCALC 103 (H) 06/01/2022    Lab Results  Component Value Date   TRIG 58 06/01/2022   -Follow low fat diet and exercise   -Blood pressure goal <140/90 - Microalbumin/creatinine goal < 30 -Last MA/Cr is as follows: Lab Results   Component Value Date   MICROALBUR 3.1 06/01/2022   -not on ACE/ARB -dit changes including salt restriction -limit eating outside -counseled BP targets per standards of diabetes care -uncontrolled blood pressure can lead to retinopathy, nephropathy and cardiovascular and atherosclerotic heart disease  Reviewed and counseled on: -A1C target -Blood sugar targets -Complications of uncontrolled diabetes  -Checking blood sugar before meals and bedtime and bring log next visit -All medications with mechanism of action and side effects -Hypoglycemia management: rule of 15's, Glucagon Emergency Kit and medical alert ID -low-carb low-fat plate-method diet -At least 20 minutes of physical activity per day -Annual dilated retinal eye exam and foot exam -compliance and follow up needs -follow up as scheduled or earlier if problem gets worse  Call if blood sugar is less than 70 or consistently above 250    Take a 15 gm snack of carbohydrate at bedtime before you go to sleep if your blood sugar is less than 100.    If you are going to fast after midnight for a test or procedure, ask your physician for instructions on how to reduce/decrease your insulin  dose.    Call if blood sugar is less than 70 or consistently above 250  -Treating a low sugar by rule of 15  (15 gms of sugar every 15 min until sugar is more than 70) If you feel your sugar is low, test your  sugar to be sure If your sugar is low (less than 70), then take 15 grams of a fast acting Carbohydrate (3-4 glucose tablets or glucose gel or 4 ounces of juice or regular soda) Recheck your sugar 15 min after treating low to make sure it is more than 70 If sugar is still less than 70, treat again with 15 grams of carbohydrate          Don't drive the hour of hypoglycemia  If unconscious/unable to eat or drink by mouth, use glucagon injection or nasal spray baqsimi and call 911. Can repeat again in 15 min if still unconscious.  Return in  about 6 months (around 01/01/2024).   I have reviewed current medications, nurse's notes, allergies, vital signs, past medical and surgical history, family medical history, and social history for this encounter. Counseled patient on symptoms, examination findings, lab findings, imaging results, treatment decisions and monitoring and prognosis. The patient understood the recommendations and agrees with the treatment plan. All questions regarding treatment plan were fully answered.  Jorge Newcomer, MD  07/01/23    History of Present Illness Deanna Levine is a 23 y.o. year old female who presents for follow up of Type II diabetes mellitus.  Tameria Lazarz was first diagnosed at age of 50. 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  Home diabetes regimen: Mounjaro  12.5 mg weekly  COMPLICATIONS -  MI/Stroke -  retinopathy -  neuropathy -  nephropathy  BLOOD SUGAR DATA Doesn't check BG at home  Physical Exam  BP 106/80   Pulse 98   Ht 5\' 4"  (1.626 m)   Wt 186 lb (84.4 kg)   SpO2 98%   BMI 31.93 kg/m    Constitutional: well developed, well nourished Head: normocephalic, atraumatic Eyes: sclera anicteric, no redness Neck: supple Lungs: normal respiratory effort Neurology: alert and oriented Skin: dry, no appreciable rashes Musculoskeletal: no appreciable defects Psychiatric: normal mood and affect Diabetic Foot Exam - Simple   No data filed      Current Medications Patient's Medications  New Prescriptions   TIRZEPATIDE  (MOUNJARO ) 15 MG/0.5ML PEN    Inject 15 mg into the skin once a week.  Previous Medications   AZELASTINE  (ASTELIN ) 0.1 % NASAL SPRAY    Place 2 sprays into both nostrils 2 (two) times daily. Use in each nostril as directed   BLOOD GLUCOSE MONITORING SUPPL DEVI    1 each by Does not apply route in the morning, at noon, and at bedtime. May substitute to any manufacturer covered by patient's  insurance.   CETIRIZINE (ZYRTEC) 10 MG TABLET    Take 10 mg by mouth daily as needed for allergies.   CHLORHEXIDINE  (HIBICLENS ) 4 % EXTERNAL LIQUID    Apply 1 Application topically daily as needed.   CLINDAMYCIN -BENZOYL PEROXIDE (BENZACLIN) GEL    Apply 1 Application topically 2 (two) times daily.   CONTINUOUS GLUCOSE SENSOR (DEXCOM G7 SENSOR) MISC    1 Device by Does not apply route continuous.   FIBER ADULT GUMMIES 2 G CHEW    Chew 1 tablet by mouth daily.   FLUTICASONE  (FLONASE ) 50 MCG/ACT NASAL SPRAY    SHAKE LIQUID AND USE 2 SPRAYS IN EACH NOSTRIL DAILY   GLUCOSE BLOOD (ACCU-CHEK GUIDE TEST) TEST STRIP    TEST IN THE MORNING, AT NOON, AND AT BEDTIME   KETOCONAZOLE  (NIZORAL ) 2 % CREAM    Apply to both feet and between toes once daily for 6 weeks.  MELOXICAM  (MOBIC ) 15 MG TABLET    TAKE 1 TABLET(15 MG) BY MOUTH DAILY   MENAQUINONE-7 PO    Take 100 mcg by mouth daily.   NA SULFATE-K SULFATE-MG SULF 17.5-3.13-1.6 GM/177ML SOLN    Take 177 mLs by mouth See admin instructions.   ONDANSETRON  (ZOFRAN -ODT) 4 MG DISINTEGRATING TABLET    Take 1 tablet (4 mg total) by mouth every 8 (eight) hours as needed for nausea or vomiting.   PROPYLENE GLYCOL (SYSTANE BALANCE OP)    Apply to eye.   TRIAMCINOLONE  CREAM (KENALOG ) 0.1 %    Apply to rough area of feet twice daily.   ZONISAMIDE  (ZONEGRAN ) 100 MG CAPSULE    TAKE 2 CAPSULES BY MOUTH DAILY  Modified Medications   No medications on file  Discontinued Medications   TIRZEPATIDE  (MOUNJARO ) 12.5 MG/0.5ML PEN    Inject 12.5 mg into the skin once a week.   TIRZEPATIDE  (MOUNJARO ) 12.5 MG/0.5ML PEN    Inject 12.5 mg into the skin once a week.    Allergies No Known Allergies  Past Medical History Past Medical History:  Diagnosis Date   Absence seizure disorder (HCC)    Allergy     Colitis 07/16/2021   Complication of anesthesia    slow to wake up from anesthesia, occ HA with anesthesia    Diabetes mellitus without complication (HCC)    type 2    Difficulty swallowing pills    Dyspraxia    Eczema    Global developmental delay    Obesity (BMI 30-39.9)    PONV (postoperative nausea and vomiting)    Precocious female puberty    Seizures (HCC)    last seizure 2012 - no current med   Sleep apnea    White-Sutton syndrome     Past Surgical History Past Surgical History:  Procedure Laterality Date   ADENOIDECTOMY     BALLOON DILATION N/A 11/23/2021   Procedure: BALLOON DILATION;  Surgeon: Lindle Rhea, MD;  Location: [facility];  Service: Gastroenterology;  Laterality: N/A;   BIOPSY  07/16/2021   Procedure: BIOPSY;  Surgeon: Lindle Rhea, MD;  [facility];  Service: Gastroenterology;;   BIOPSY  11/23/2021   Procedure: BIOPSY;  Surgeon: Lindle Rhea, MD;  Location: [facility];  Service: Gastroenterology;;   bone removal Left 01/16/2021   foot   COLONOSCOPY WITH PROPOFOL  N/A 07/16/2021   Procedure: COLONOSCOPY WITH PROPOFOL ;  Surgeon: Lindle Rhea, MD;  Location: [facility] ENDOSCOPY;  Service: Gastroenterology;  Laterality: N/A;   ESOPHAGOGASTRODUODENOSCOPY (EGD) WITH PROPOFOL  N/A 11/23/2021   Procedure: ESOPHAGOGASTRODUODENOSCOPY (EGD) WITH PROPOFOL ;  Surgeon: Lindle Rhea, MD;  Location: [facility];  Service: Gastroenterology;  Laterality: N/A;   FOOT SURGERY Right 11/2022   GASTROCNEMIUS RECESSION Bilateral 04/25/2020   Procedure: BILATERAL GASTROCNEMIUS RECESSION;  Surgeon: Timothy Ford, MD;  Location: [facility] OR;  Service: Orthopedics;  Laterality: Bilateral;   SUPPRELIN  IMPLANT  05/06/2011   Procedure: SUPPRELIN  IMPLANT;  Surgeon: Melven Stable. Alanda Allegra, MD;  Location: [facility];  Service: Pediatrics;  Laterality: Right;   SUPPRELIN  IMPLANT Right 06/14/2013   Procedure: REMOVAL OF SUPPRELIN  IMPLANT FROM RIGHT UPPER ARM;  Surgeon: Melven Stable. Alanda Allegra, MD;  Location: [facility];  Service: Pediatrics;  Laterality: Right;   TONSILLECTOMY     TONSILLECTOMY AND ADENOIDECTOMY      TYMPANOSTOMY TUBE PLACEMENT     x 2    Family History family history includes Anesthesia problems in her mother; Asthma in her maternal aunt; Colon cancer in an other  family member; Diabetes in her maternal grandmother, mother, and paternal grandmother; Fibromyalgia in her mother; Heart disease in her maternal grandfather and paternal grandmother; Henoch-Schonlein purpura in her brother; Hyperlipidemia in her maternal grandfather, maternal grandmother, paternal grandfather, and paternal grandmother; Hypertension in her maternal grandfather, maternal grandmother, paternal grandfather, and paternal grandmother; Kidney disease in her maternal grandfather and paternal grandmother; Rheum arthritis in her mother; Seizures in her maternal uncle; Ulcerative colitis in her mother.  Social History Social History   Socioeconomic History   Marital status: Single    Spouse name: Not on file   Number of children: 0   Years of education: 12   Highest education level: Not on file  Occupational History   Occupation: [employer]  Tobacco Use   Smoking status: Never    Passive exposure: Never   Smokeless tobacco: Never  Vaping Use   Vaping status: Never Used  Substance and Sexual Activity   Alcohol use: No    Alcohol/week: 0.0 standard drinks of alcohol   Drug use: No   Sexual activity: Never    Birth control/protection: None  Other Topics Concern   Not on file  Social History Narrative   Lakyra is graduated. [name] doing average.    She enjoys playing softball and basketball.   Lives with her parents.    Not working.   Left handed   Two story home      Had surgery on her legs in Feb and is physical therapy twice a week.   Left handed   No caffeine   Social Drivers of Corporate investment banker Strain: Not on file  Food Insecurity: Not on file  Transportation Needs: Not on file  Physical Activity: Not on file  Stress: Not on file  Social Connections: Not on file  Intimate  Partner Violence: Not on file    Lab Results  Component Value Date   HGBA1C 5.2 07/01/2023   HGBA1C 5.1 08/31/2022   HGBA1C 5.1 06/01/2022   Lab Results  Component Value Date   CHOL 155 06/01/2022   Lab Results  Component Value Date   HDL 38 (L) 06/01/2022   Lab Results  Component Value Date   LDLCALC 103 (H) 06/01/2022   Lab Results  Component Value Date   TRIG 58 06/01/2022   Lab Results  Component Value Date   CHOLHDL 4.1 06/01/2022   Lab Results  Component Value Date   CREATININE 0.72 11/18/2022   Lab Results  Component Value Date   GFR 118.55 11/18/2022   Lab Results  Component Value Date   MICROALBUR 3.1 06/01/2022      Component Value Date/Time   NA 139 11/18/2022 1050   NA 131 (L) 03/05/2019 1430   K 4.0 11/18/2022 1050   CL 108 11/18/2022 1050   CO2 23 11/18/2022 1050   GLUCOSE 93 11/18/2022 1050   BUN 12 11/18/2022 1050   BUN 12 03/05/2019 1430   CREATININE 0.72 11/18/2022 1050   CREATININE 0.70 06/01/2022 0929   CALCIUM 9.4 11/18/2022 1050   PROT 7.6 11/18/2022 1050   PROT 7.0 03/05/2019 1430   ALBUMIN 4.3 11/18/2022 1050   ALBUMIN 4.4 03/05/2019 1430   AST 14 11/18/2022 1050   ALT 12 11/18/2022 1050   ALKPHOS 63 11/18/2022 1050   BILITOT 0.5 11/18/2022 1050   BILITOT 0.4 03/05/2019 1430   GFRNONAA >60 04/25/2020 0646   GFRAA 139 03/05/2019 1430      Latest Ref Rng & Units  11/18/2022   10:50 AM 06/01/2022    9:29 AM 08/24/2021    2:18 PM  BMP  Glucose 70 - 99 mg/dL 93  89  161   BUN 6 - 23 mg/dL 12  14  10    Creatinine 0.40 - 1.20 mg/dL 0.96  0.45  4.09   BUN/Creat Ratio 6 - 22 (calc)  SEE NOTE:  NOT APPLICABLE   Sodium 135 - 145 mEq/L 139  141  139   Potassium 3.5 - 5.1 mEq/L 4.0  4.4  3.9   Chloride 96 - 112 mEq/L 108  111  105   CO2 19 - 32 mEq/L 23  20  23    Calcium 8.4 - 10.5 mg/dL 9.4  9.3  9.7        Component Value Date/Time   WBC 6.8 11/18/2022 1050   RBC 4.86 11/18/2022 1050   HGB 10.3 (L) 11/18/2022 1050   HGB  11.3 03/05/2019 1430   HCT 33.5 (L) 11/18/2022 1050   HCT 35.7 03/05/2019 1430   PLT 329.0 11/18/2022 1050   PLT 182 03/05/2019 1430   MCV 69.0 Repeated and verified X2. (L) 11/18/2022 1050   MCV 67 (L) 03/05/2019 1430   MCH 21.3 (L) 06/01/2022 0929   MCHC 30.6 11/18/2022 1050   RDW 16.4 (H) 11/18/2022 1050   RDW 17.9 (H) 03/05/2019 1430   LYMPHSABS 2,365 02/12/2021 1104   MONOABS 0.5 07/05/2012 1610   EOSABS 26 02/12/2021 1104   BASOSABS 26 02/12/2021 1104     Parts of this note may have been dictated using voice recognition software. There may be variances in spelling and vocabulary which are unintentional. Not all errors are proofread. Please notify the [facility] if any discrepancies are noted or if the meaning of any statement is not clear.

## 2023-07-07 ENCOUNTER — Ambulatory Visit: Admitting: Behavioral Health

## 2023-07-07 ENCOUNTER — Ambulatory Visit (INDEPENDENT_AMBULATORY_CARE_PROVIDER_SITE_OTHER): Admitting: Podiatry

## 2023-07-07 ENCOUNTER — Ambulatory Visit (INDEPENDENT_AMBULATORY_CARE_PROVIDER_SITE_OTHER)

## 2023-07-07 ENCOUNTER — Encounter: Payer: Self-pay | Admitting: Podiatry

## 2023-07-07 VITALS — Ht 64.0 in | Wt 186.0 lb

## 2023-07-07 DIAGNOSIS — M2142 Flat foot [pes planus] (acquired), left foot: Secondary | ICD-10-CM

## 2023-07-07 DIAGNOSIS — M21861 Other specified acquired deformities of right lower leg: Secondary | ICD-10-CM

## 2023-07-07 DIAGNOSIS — M2141 Flat foot [pes planus] (acquired), right foot: Secondary | ICD-10-CM

## 2023-07-07 DIAGNOSIS — Q6689 Other  specified congenital deformities of feet: Secondary | ICD-10-CM | POA: Diagnosis not present

## 2023-07-07 DIAGNOSIS — M2170 Unequal limb length (acquired), unspecified site: Secondary | ICD-10-CM

## 2023-07-07 DIAGNOSIS — F419 Anxiety disorder, unspecified: Secondary | ICD-10-CM

## 2023-07-07 DIAGNOSIS — R4589 Other symptoms and signs involving emotional state: Secondary | ICD-10-CM

## 2023-07-07 DIAGNOSIS — M19072 Primary osteoarthritis, left ankle and foot: Secondary | ICD-10-CM

## 2023-07-07 DIAGNOSIS — M21862 Other specified acquired deformities of left lower leg: Secondary | ICD-10-CM | POA: Diagnosis not present

## 2023-07-07 DIAGNOSIS — M19071 Primary osteoarthritis, right ankle and foot: Secondary | ICD-10-CM

## 2023-07-07 NOTE — Progress Notes (Signed)
   Deneise Lever, LMFT

## 2023-07-07 NOTE — Progress Notes (Signed)
 [facility] Counselor/Therapist Progress Note  Patient ID: Deanna Levine, MRN: [id number],    Date: 07/07/2023  Time Spent: 40 min Caregility video; Pt is in her room in private & Provider working remotely from [employer]. Pt is aware of the risks/limitations of telehealth & consents to Tx today.  Time In: 3:00pm Time Out: 3:45pm   Treatment Type: Individual Therapy  Reported Symptoms: Elevated anx/dep due to changes in the status of her gait due to recent surgery & cont'd pain.  Mental Status Exam: Appearance:  Casual     Behavior: Appropriate and Sharing  Motor: Normal  Speech/Language:  Clear and Coherent  Affect: Appropriate  Mood: normal  Thought process: normal  Thought content:   WNL  Sensory/Perceptual disturbances:   WNL  Orientation: oriented to person, place, time/date, and situation  Attention: Good  Concentration: Good  Memory: WNL  Fund of knowledge:  Good  Insight:   Good  Judgment:  Good  Impulse Control: Good   Risk Assessment: Danger to Self:  No Self-injurious Behavior: No Danger to Others: No Duty to Warn:no Physical Aggression / Violence:No  Access to Firearms a concern: No  Gang Involvement:No   Subjective: 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 She has seen her Deanna Levine this past week.  [name] singing w/her Deanna Levine once [place]. She does not sing @ [address] ([employer] on [address] [name] too shy for grp singing in a Choir.   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 She will start this process as soon as her feet are healed.   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 [name] excited for a [employer] Graduation this wknd for her Cousin. The Family will celebrate & eat out. Deanna Levine has great humor today & is looking forward to time w/Family. She  will report back next visit on her exploration of the Athletic Trainer Prog @ [employer].  Target Date: 07/29/2023  Progress: 6  Frequency: Once every 2-3 wks  Modality: Deanna Levine, Deanna Levine

## 2023-07-10 ENCOUNTER — Encounter: Payer: Self-pay | Admitting: Podiatry

## 2023-07-10 NOTE — Progress Notes (Signed)
  Subjective:  Patient ID: Deanna Levine, female    DOB: [birth year],  MRN: [id number]  Chief Complaint  Patient presents with   Foot Pain    F/U Bilateral feet    23 y.o. female presents with the above complaint. History confirmed with patient.  She returns for follow-up with still having quite a bit of pain in both feet limited motion inability to walk she feels that her feet turn out  Objective:  Physical Exam: warm, good capillary refill, no trophic changes or ulcerative lesions, normal DP and PT pulses, normal sensory exam, and pain and limited range of motion of ankle and subtalar joint, pain along the PT tendon at the navicular insertion bilateral, her feet are in an abducted position relative to the leg as well as hindfoot varus   Radiographs: Multiple views x-ray of both feet: Degenerative changes of hindfoot and midfoot  Assessment:   1. Pes planus of both feet   2. Coalition, calcaneal tarsal   3. Arthritis of right subtalar joint   4. Arthritis of left subtalar joint   5. Acquired unequal limb length   6. Tibial torsion, bilateral      Plan:  Patient was evaluated and treated and all questions answered.  Radiographs were taken today of both feet which we reviewed together as well as her parents who are present today.  We discussed the previous resection and there are still degenerative changes within the subtalar joint and ankle and hindfoot as well.  She still has limited range of motion and ambulatory ability in regards to her subtalar joint and ankle function.  She places her feet in an abducted yet hindfoot varus position when she walks which I examined today.  She still has a limb length rep and see contributors as well.  I suspect she likely has some tibial torsion.  Likely is going to need some level of subtalar fusion and further reconstructive surgery in regards to her feet and ankle.  I recommended reevaluating with new MRIs to evaluate the joint structure and  surrounding soft tissues which she has posterior tibial tendon pain now as well.  Additionally I recommended a CT scanogram to evaluate for tibial torsion and her limb discrepancy for surgical planning.  Follow-up with me after studies.  No follow-ups on file.

## 2023-07-12 ENCOUNTER — Encounter: Payer: Self-pay | Admitting: Podiatry

## 2023-07-13 ENCOUNTER — Telehealth: Payer: Self-pay

## 2023-07-13 NOTE — Telephone Encounter (Signed)
 Spoke with patient and mother and gave them number to [facility] with extention to call and schedule MRI at their convience . As the have been ordered on 07/07/2023 by Dr. Michalene Agee Nothing else follows at this time

## 2023-07-28 ENCOUNTER — Ambulatory Visit: Admitting: Behavioral Health

## 2023-07-28 ENCOUNTER — Ambulatory Visit
Admission: RE | Admit: 2023-07-28 | Discharge: 2023-07-28 | Disposition: A | Discharge status: Home / Self Care | Source: Ambulatory Visit | Attending: Podiatry | Admitting: Podiatry

## 2023-07-28 DIAGNOSIS — R936 Abnormal findings on diagnostic imaging of limbs: Secondary | ICD-10-CM | POA: Diagnosis not present

## 2023-07-28 DIAGNOSIS — Q6689 Other  specified congenital deformities of feet: Secondary | ICD-10-CM

## 2023-07-28 DIAGNOSIS — F419 Anxiety disorder, unspecified: Secondary | ICD-10-CM

## 2023-07-28 DIAGNOSIS — R4589 Other symptoms and signs involving emotional state: Secondary | ICD-10-CM

## 2023-07-28 DIAGNOSIS — M19072 Primary osteoarthritis, left ankle and foot: Secondary | ICD-10-CM

## 2023-07-28 DIAGNOSIS — M19071 Primary osteoarthritis, right ankle and foot: Secondary | ICD-10-CM

## 2023-07-28 NOTE — Progress Notes (Signed)
   Deneise Lever, LMFT

## 2023-07-28 NOTE — Progress Notes (Addendum)
 [facility] Counselor/Therapist Progress Note  Patient ID: Deanna Levine, MRN: [id number],    Date: 07/28/2023  Time Spent: 45 min Caregility video; Pt is in the Family car for privacy & Provider working remotely from [employer]. Pt is aware of the risks/limitations & consents to Tx today. Time In: 2:00pm Time Out: 2:45pm  Treatment Type: Individual Therapy  Reported Symptoms: Elevated foot pain post-surgically  Mental Status Exam: Appearance:  Casual     Behavior: Appropriate and Sharing  Motor: Normal  Speech/Language:  Clear and Coherent  Affect: Appropriate  Mood: normal  Thought process: normal  Thought content:   WNL  Sensory/Perceptual disturbances:   WNL  Orientation: oriented to person, place, time/date, and situation  Attention: Good  Concentration: Good  Memory: WNL  Fund of knowledge:  Good  Insight:   Good  Judgment:  Good  Impulse Control: Good   Risk Assessment: Danger to Self:  No Self-injurious Behavior: No Danger to Others: No Duty to Warn:no Physical Aggression / Violence:No  Access to Firearms a concern: No  Gang Involvement:No   Subjective: Pt is upbeat today. She has been busy today in the World w/her Parents.   Pt has been dec'g her time playing video games & instead reading her Bible. [name] excited for the new Deanna Levine Movie.   Clinician discovered Parents are in the car today @ 2:15pm into the call.   Mom is working on cooking & measurement, reading books, & watching movies.  Interventions: Family Systems  Diagnosis:Anxiety  Anxiety about health  Plan: Deanna Levine will cont to learn all the tasks she is cooperating w/Mom to accomplish. Parents Deanna & Deanna Levine are trying to teach her how to cooperate w/the Family's needs. Deanna Levine is trying to get up earlier.  Target Date: 08/29/2023  Progress: 5  Frequency: Once every 2-3 wks  Modality: Family Th  Elroy Hallmark, LMFT

## 2023-08-03 ENCOUNTER — Encounter: Payer: Self-pay | Admitting: Podiatry

## 2023-08-03 DIAGNOSIS — M2170 Unequal limb length (acquired), unspecified site: Secondary | ICD-10-CM

## 2023-08-03 DIAGNOSIS — M21861 Other specified acquired deformities of right lower leg: Secondary | ICD-10-CM

## 2023-08-11 ENCOUNTER — Ambulatory Visit: Payer: 59 | Admitting: Neurology

## 2023-08-15 ENCOUNTER — Ambulatory Visit (INDEPENDENT_AMBULATORY_CARE_PROVIDER_SITE_OTHER): Payer: 59 | Admitting: Neurology

## 2023-08-15 ENCOUNTER — Encounter: Payer: Self-pay | Admitting: Neurology

## 2023-08-15 VITALS — BP 101/71 | HR 94 | Ht 64.0 in | Wt 191.2 lb

## 2023-08-15 DIAGNOSIS — G40409 Other generalized epilepsy and epileptic syndromes, not intractable, without status epilepticus: Secondary | ICD-10-CM

## 2023-08-15 DIAGNOSIS — G43009 Migraine without aura, not intractable, without status migrainosus: Secondary | ICD-10-CM

## 2023-08-15 MED ORDER — ZONISAMIDE 100 MG PO CAPS: ORAL_CAPSULE | ORAL | 4 refills | Status: DC

## 2023-08-15 NOTE — Patient Instructions (Signed)
 Good to see you! Continue Zonisamide  100mg : take 2 capsules every night. Follow-up in 8 months, call for any changes.    Seizure Precautions: 1. If medication has been prescribed for you to prevent seizures, take it exactly as directed.  Do not stop taking the medicine without talking to your doctor first, even if you have not had a seizure in a long time.   2. Avoid activities in which a seizure would cause danger to yourself or to others.  Don't operate dangerous machinery, swim alone, or climb in high or dangerous places, such as on ladders, roofs, or girders.  Do not drive unless your doctor says you may.  3. If you have any warning that you may have a seizure, lay down in a safe place where you can't hurt yourself.    4.  No driving for 6 months from last seizure, as per [place]  state law.   Please refer to the following link on the Epilepsy Foundation of America's website for more information: http://www.epilepsyfoundation.org/answerplace/Social/driving/drivingu.cfm   5.  Maintain good sleep hygiene. Avoid alcohol.  6.  Notify your neurology if you are planning pregnancy or if you become pregnant.  7.  Contact your doctor if you have any problems that may be related to the medicine you are taking.  8.  Call 911 and bring the patient back to the ED if:        A.  The seizure lasts longer than 5 minutes.       B.  The patient doesn't awaken shortly after the seizure  C.  The patient has new problems such as difficulty seeing, speaking or moving  D.  The patient was injured during the seizure  E.  The patient has a temperature over 102 F (39C)  F.  The patient vomited and now is having trouble breathing

## 2023-08-15 NOTE — Progress Notes (Signed)
 NEUROLOGY FOLLOW UP OFFICE NOTE  Deanna Levine [id number] 2000/10/18  HISTORY OF PRESENT ILLNESS: I had the pleasure of seeing Deanna Levine in follow-up in the neurology clinic on 08/15/2023.  The patient was last seen 7 months ago for primary generalized epilepsy and migraines. [name] again accompanied by her father who helps supplement the history today.  Records and images were personally reviewed where available.  Since her last visit, they deny any seizures or seizure-like symptoms on Zonisamide  200mg  at bedtime, no side effects. Her father has not noticed any staring spells, her mother has not mentioned any concerns. She denies any staring/unresponsive episodes, gaps in time, olfactory/gustatory hallucinations, myoclonic jerks. She has not had any headache recurrence. No dizziness, vision changes. She has been having difficulties with her right ankle and sees Podiatry, MRI was done on 5/29, results unavailable. She fell yesterday when her right foot gave out. She gets 6 hours of sleep, sometimes she gets 2 hours and naps in the daytime. Mood is good.    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 She was seen by Dr. Lydia Sams in August 2021 for concern for seizures. She was previously followed by pediatric neurologist Dr. Darlys Eland, notes were reviewed. Seizures began at age 23 with multiple absence seizures daily where she would drop her hand to her side and begin drooling, unresponsive for around 2 minutes followed by sleepiness. No convulsive seizures. At that time, MRI brain and initial EEG were normal. She was treated with  carbamazepine  with no further seizures after 2010, however repeat EEG in 2012 reported diphasic sharply contoured slow wave activity seen bilaterally synchronously in the central, temporal, and parietal region so she was continued on medication. She had another EEG in 2015 which was normal and she was tapered off carbamazepine . Of note, on her 2017 clinic visit, her mother reported that she stares sometimes but it seems to be daydreaming or not paying attention rather than seizure activity because she would respond when called. She presented to Dr. Lydia Sams in 2021 with her mother reporting very brief staring spells lasting a few seconds, different from her typical seizures because they were much shorter and would not recur. They appeared as if she was daydreaming or not paying attention, worse with stress. [name] accompanied today by her father who has not seen the staring episodes but states that she does daydream and sometimes would not respond, saying she does not hear them calling her. She states she feels good. She denies any gaps in time, olfactory/gustatory hallucinations, focal numbness/tingling, myoclonic jerks. No headaches, dizziness, vision changes, no falls. She has been having orthopedic difficulties in her feet and hands and was ultimately diagnosed with WSS a few months ago. She was previously working but has been taken out of work because she is unable to stand for prolonged periods and it affects her muscle skills. She manages her own medications, her father reports she is pretty good with this. Sleep is good, she sleeps 9-10 hours. She states her mood is okay, her father notes there has been some stress recently.   Epilepsy Risk Factors:  White-Sutton Syndrome. She had global developmental delay since infancy. There is no  history of febrile convulsions, CNS infections such as meningitis/encephalitis, significant traumatic brain injury, neurosurgical procedures, or family history of  seizures.  Prior AEDs: carbamazepine , topiramate  (nausea)  Laboratory Data: EEGs: EEG 2012 reported diphasic sharply contoured slow wave activity seen bilaterally synchronously in the central, temporal, and parietal region EEG normal 2015 and 2018 48-hour EEG 04/2021 abnormal with rare generalized 4 Hz spike and wave discharges with frontal predominance seen in sleep.  MRI brain in 2003 unremarkable.  PAST MEDICAL HISTORY: Past Medical History:  Diagnosis Date   Absence seizure disorder (HCC)    Allergy     Colitis 07/16/2021   Complication of anesthesia    slow to wake up from anesthesia, occ HA with anesthesia    Diabetes mellitus without complication (HCC)    type 2   Difficulty swallowing pills    Dyspraxia    Eczema    Global developmental delay    Obesity (BMI 30-39.9)    PONV (postoperative nausea and vomiting)    Precocious female puberty    Seizures (HCC)    last seizure 2012 - no current med   Sleep apnea    White-Sutton syndrome     MEDICATIONS: Current Outpatient Medications on File Prior to Visit  Medication Sig Dispense Refill   azelastine  (ASTELIN ) 0.1 % nasal spray Place 2 sprays into both nostrils 2 (two) times daily. Use in each nostril as directed     Blood Glucose Monitoring Suppl DEVI 1 each by Does not apply route in the morning, at noon, and at bedtime. May substitute to any manufacturer covered by patient's insurance. 1 each 0   cetirizine (ZYRTEC) 10 MG tablet Take 10 mg by mouth daily as needed for allergies.     chlorhexidine  (HIBICLENS ) 4 % external liquid Apply 1 Application topically daily as needed.     clindamycin -benzoyl peroxide (BENZACLIN) gel Apply 1 Application topically 2 (two) times daily.     Continuous Glucose Sensor (DEXCOM G7 SENSOR) MISC 1 Device by Does not apply route continuous. 9 each 0   Fiber Adult Gummies 2 g CHEW Chew 1 tablet by mouth daily.     fluticasone  (FLONASE ) 50 MCG/ACT nasal spray SHAKE LIQUID AND USE 2 SPRAYS IN  EACH NOSTRIL DAILY 48 g 1   glucose blood (ACCU-CHEK GUIDE TEST) test strip TEST IN THE MORNING, AT NOON, AND AT BEDTIME 100 strip 5   ketoconazole  (NIZORAL ) 2 % cream Apply to both feet and between toes once daily for 6 weeks. 60 g 1   meloxicam  (MOBIC ) 15 MG tablet TAKE 1 TABLET(15 MG) BY MOUTH DAILY 30 tablet 3   MENAQUINONE-7 PO Take 100 mcg by mouth daily.     Na Sulfate-K Sulfate-Mg Sulf 17.5-3.13-1.6 GM/177ML SOLN Take 177 mLs by mouth See admin instructions.     ondansetron  (ZOFRAN -ODT) 4 MG disintegrating tablet Take 1 tablet (4 mg total) by mouth every 8 (eight) hours as needed for nausea or vomiting. 20 tablet 0   Propylene Glycol (SYSTANE BALANCE OP) Apply to eye.     tirzepatide  (MOUNJARO ) 15 MG/0.5ML Pen Inject 15 mg into the skin once a week. 6 mL 3   triamcinolone  cream (KENALOG ) 0.1 % Apply to rough area of feet twice daily. 454 g 0   zonisamide  (ZONEGRAN ) 100 MG capsule TAKE 2 CAPSULES BY MOUTH DAILY 180 capsule 3   No current facility-administered medications on file prior to visit.    ALLERGIES: No Known Allergies  FAMILY HISTORY: Family History  Problem Relation Age of  Onset   Diabetes Mother        steroid induced; hx. colitis   Anesthesia problems Mother        severe headache lasting 2-3 days   Rheum arthritis Mother    Ulcerative colitis Mother    Fibromyalgia Mother    Henoch-Schonlein purpura Brother        in remission   Hypertension Maternal Grandmother    Hyperlipidemia Maternal Grandmother    Diabetes Maternal Grandmother    Kidney disease Maternal Grandfather    Hypertension Maternal Grandfather    Heart disease Maternal Grandfather    Hyperlipidemia Maternal Grandfather    Kidney disease Paternal Grandmother    Hypertension Paternal Grandmother    Diabetes Paternal Grandmother    Heart disease Paternal Grandmother    Hyperlipidemia Paternal Grandmother    Hypertension Paternal Grandfather    Hyperlipidemia Paternal Grandfather    Asthma  Maternal Aunt    Seizures Maternal Uncle    Colon cancer Other        great aunt   Esophageal cancer Neg Hx    Esophageal varices Neg Hx    Pancreatic disease Neg Hx     SOCIAL HISTORY: Social History   Socioeconomic History   Marital status: Single    Spouse name: Not on file   Number of children: 0   Years of education: 12   Highest education level: Not on file  Occupational History   Occupation: [employer]  Tobacco Use   Smoking status: Never    Passive exposure: Never   Smokeless tobacco: Never  Vaping Use   Vaping status: Never Used  Substance and Sexual Activity   Alcohol use: No    Alcohol/week: 0.0 standard drinks of alcohol   Drug use: No   Sexual activity: Never    Birth control/protection: None  Other Topics Concern   Not on file  Social History Narrative   Deanna Levine is graduated. [name] doing average.    She enjoys playing softball and basketball.   Lives with her parents.    Not working.   Left handed   Two story home      Had surgery on her legs in Feb and is physical therapy twice a week.   Left handed   No caffeine   Social Drivers of Corporate investment banker Strain: Not on file  Food Insecurity: Not on file  Transportation Needs: Not on file  Physical Activity: Not on file  Stress: Not on file  Social Connections: Not on file  Intimate Partner Violence: Not on file     PHYSICAL EXAM: Vitals:   08/15/23 0824  BP: 101/71  Pulse: 94  SpO2: 100%   General: No acute distress Head:  Normocephalic/atraumatic Skin/Extremities: No rash, no edema Neurological Exam: alert and awake. No aphasia or dysarthria. Fund of knowledge is appropriate. Attention and concentration are normal.   Cranial nerves: Pupils equal, round. Extraocular movements intact with no nystagmus. Visual fields full.  No facial asymmetry.  Motor: Bulk and tone normal, muscle strength 5/5 throughout with no pronator drift.   Finger to nose testing intact.  Gait slow and  cautious favoring right ankle, no ataxia. No tremors.    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 Her 24-hour EEG showed  rare generalized 4 Hz spike and wave discharges with frontal predominance in sleep. MRI brain no acute changes, there are a few white matter signal changes likely chronic microvascular disease. She has been doing well from a seizure and migraine standpoint, continue Zonisamide  200mg  at bedtime. Continue follow-up with Podiatry. She does not drive. Follow-up in 8 months, call for any changes.     Thank you for allowing me to participate in her care.  Please do not hesitate to call for any questions or concerns.    Rayfield Cairo, M.D.   CC: Dr. Nicolette Barrio

## 2023-08-18 ENCOUNTER — Encounter (HOSPITAL_COMMUNITY): Payer: Self-pay

## 2023-08-18 ENCOUNTER — Ambulatory Visit (INDEPENDENT_AMBULATORY_CARE_PROVIDER_SITE_OTHER): Admitting: Behavioral Health

## 2023-08-18 ENCOUNTER — Ambulatory Visit (HOSPITAL_COMMUNITY)
Admission: RE | Admit: 2023-08-18 | Discharge: 2023-08-18 | Disposition: A | Discharge status: Home / Self Care | Source: Ambulatory Visit | Attending: Podiatry | Admitting: Podiatry

## 2023-08-18 DIAGNOSIS — F419 Anxiety disorder, unspecified: Secondary | ICD-10-CM

## 2023-08-18 DIAGNOSIS — M2170 Unequal limb length (acquired), unspecified site: Secondary | ICD-10-CM | POA: Insufficient documentation

## 2023-08-18 DIAGNOSIS — M21861 Other specified acquired deformities of right lower leg: Secondary | ICD-10-CM | POA: Insufficient documentation

## 2023-08-18 DIAGNOSIS — M21862 Other specified acquired deformities of left lower leg: Secondary | ICD-10-CM | POA: Insufficient documentation

## 2023-08-18 DIAGNOSIS — R4589 Other symptoms and signs involving emotional state: Secondary | ICD-10-CM

## 2023-08-18 NOTE — Progress Notes (Addendum)
 [facility] Counselor/Therapist Progress Note  Patient ID: Deanna Levine, MRN: [id number],    Date: 08/18/2023  Time Spent: 40 min Caregility video; Pt is home in private & Provider working remotely from [employer]. Pt is aware of the risks/limitations of telehealth & consents to Tx today.  Time In: 3:00pm Time Out: 3:40pm   Treatment Type: Individual Therapy  Reported Symptoms: Inc'd anx regarding Levine status changes  Mental Status Exam: Appearance:  Casual     Behavior: Appropriate and Sharing  Motor: Normal  Speech/Language:  Clear and Coherent  Affect: Appropriate  Mood: normal  Thought process: normal  Thought content:   WNL  Sensory/Perceptual disturbances:   NA  Orientation: oriented to person, place, time/date, and situation  Attention: Good  Concentration: Good  Memory: WNL  Fund of knowledge:  Good  Insight:   Good  Judgment:  Good  Impulse Control: Good   Risk Assessment: Danger to Self:  No Self-injurious Behavior: No Danger to Others: No Duty to Warn:no Physical Aggression / Violence:No  Access to Firearms a concern: No  Gang Involvement:No   Subjective: Pt is interested in learning more about Deanna Levine Levine. We explored the Deanna Levine & signed Pt up for the Newsletter. She will explore the Levine further. Dr. Jimmey Levine is her Geneticist.   Pt takes her Deanna Levine  15mg  tablet for this daily. She has been able to sleep off & on @ night. She also uses Deanna Levine  daily when the pain is persistent.  Interventions: Family Systems  Diagnosis:Deanna  Deanna Levine  Plan: Deanna Levine is having pain issues w/her feet as a result of complications from Deanna Levine. She wants more info & will explore Pt exp's & the Newsletter from the Deanna Levine before next session so we can discuss.  Target Date:  09/13/2023  Progress: 6  Frequency: Once every 2-3 wks  Modality: Deanna Fam, LMFT

## 2023-08-18 NOTE — Progress Notes (Signed)
   Deneise Lever, LMFT

## 2023-08-23 ENCOUNTER — Encounter: Payer: Self-pay | Admitting: Podiatry

## 2023-08-23 ENCOUNTER — Ambulatory Visit (INDEPENDENT_AMBULATORY_CARE_PROVIDER_SITE_OTHER): Payer: Medicaid Other | Admitting: Podiatry

## 2023-08-23 DIAGNOSIS — M79674 Pain in right toe(s): Secondary | ICD-10-CM | POA: Diagnosis not present

## 2023-08-23 DIAGNOSIS — E119 Type 2 diabetes mellitus without complications: Secondary | ICD-10-CM | POA: Diagnosis not present

## 2023-08-23 DIAGNOSIS — M79675 Pain in left toe(s): Secondary | ICD-10-CM | POA: Diagnosis not present

## 2023-08-23 DIAGNOSIS — B351 Tinea unguium: Secondary | ICD-10-CM | POA: Diagnosis not present

## 2023-08-23 NOTE — Progress Notes (Signed)
  Subjective:  Patient ID: Deanna Levine, female    DOB: 05/23/2000,  MRN: [id number]  23 y.o. female presents preventative diabetic foot care and painful thick toenails that are difficult to trim. Pain interferes with ambulation. Aggravating factors include wearing enclosed shoe gear. Pain is relieved with periodic professional debridement. Chief Complaint  Patient presents with   Diabetes    Cut my toenails.  Saw Dr. Dartha - 07/01/2023; A1c - 5.2   New problem(s): None   PCP is Rollene [name] LABOR, MD No Known Allergies  Review of Systems: Negative except as noted in the HPI.   Objective:  Deanna Levine is a pleasant 23 y.o. female in NAD. AAO x 3.  Vascular Examination: Vascular status intact b/l with palpable pedal pulses. CFT immediate b/l. Pedal hair present. No edema. No pain with calf compression b/l. Skin temperature gradient WNL b/l. No varicosities noted. No cyanosis or clubbing noted.  Neurological Examination: Sensation grossly intact b/l with 10 gram monofilament. Vibratory sensation intact b/l.  Dermatological Examination: Pedal skin with normal turgor, texture and tone b/l.  No open wounds. No interdigital macerations.   Toenails 1-5 b/l thick, discolored, elongated with subungual debris and pain on dorsal palpation.   Well healed surgical scar(s) noted right ankle. Diffuse scaling noted peripherally and plantarly b/l feet.  No interdigital macerations.  No blisters, no weeping. No signs of secondary bacterial infection noted.  Musculoskeletal Examination: Muscle strength 5/5 to all lower extremity muscle groups bilaterally. Pes planus deformity noted bilateral LE.  Radiographs: None  Last A1c:      Latest Ref Rng & Units 07/01/2023    8:56 AM 08/31/2022    9:07 AM  Hemoglobin A1C  Hemoglobin-A1c 4.0 - 5.6 % 5.2  5.1      Assessment:   1. Pain due to onychomycosis of toenails of both feet   2. Type 2 diabetes mellitus without complication, without  long-term current use of insulin  (HCC)    Plan:  Patient was evaluated and treated. All patient's and/or POA's questions/concerns addressed on today's visit. Mycotic toenails 1-5 debrided in length and girth without incident. Continue soft, supportive shoe gear daily. Report any pedal injuries to medical professional. Call office if there are any quesitons/concerns. -Patient/POA to call should there be question/concern in the interim.  Return in about 9 weeks (around 10/25/2023).  Delon LITTIE Merlin, DPM      [facility] LOCATION: [address] [place] [address]                   Office [phone]   [facility] LOCATION: [address] [place], [place] [address] Office [phone]

## 2023-08-27 ENCOUNTER — Encounter: Payer: Self-pay | Admitting: Podiatry

## 2023-09-07 DIAGNOSIS — L700 Acne vulgaris: Secondary | ICD-10-CM | POA: Diagnosis not present

## 2023-09-07 DIAGNOSIS — L732 Hidradenitis suppurativa: Secondary | ICD-10-CM | POA: Diagnosis not present

## 2023-09-08 ENCOUNTER — Telehealth: Payer: Self-pay | Admitting: Podiatry

## 2023-09-08 ENCOUNTER — Ambulatory Visit (INDEPENDENT_AMBULATORY_CARE_PROVIDER_SITE_OTHER): Admitting: Podiatry

## 2023-09-08 VITALS — Ht 64.0 in | Wt 191.2 lb

## 2023-09-08 DIAGNOSIS — M19071 Primary osteoarthritis, right ankle and foot: Secondary | ICD-10-CM | POA: Diagnosis not present

## 2023-09-08 NOTE — Telephone Encounter (Signed)
 EMAILED ESTIMATE FORM TO BILLING AND [facility].

## 2023-09-08 NOTE — Telephone Encounter (Signed)
 Pts mom called and left message wanting to get pt scheduled for her surgery the first week in December she has an appt on 12/4 so she could not do that day.  I have scheduled pt for 02/03/24. Pts mom states pt is not on any blood thinners and she is currently taking the mounjaro  and I told her pt would have to stop 1 wk prior to surgery. Also pharmacy is correct in chart.

## 2023-09-09 ENCOUNTER — Ambulatory Visit: Admitting: Behavioral Health

## 2023-09-11 ENCOUNTER — Encounter: Payer: Self-pay | Admitting: Podiatry

## 2023-09-11 NOTE — Progress Notes (Signed)
 Subjective:  Patient ID: Deanna Levine, female    DOB: 2000/09/07,  MRN: [id number]  Chief Complaint  Patient presents with   Foot Pain    RM 7 Patient is here for MRI results. No additional concerns today.    23 y.o. female presents with the above complaint. History confirmed with patient.  She returns for follow-up with still having quite a bit of pain in both feet limited motion inability to walk she feels that her feet turn out  Objective:  Physical Exam: warm, good capillary refill, no trophic changes or ulcerative lesions, normal DP and PT pulses, normal sensory exam, and pain and limited range of motion of ankle and subtalar joint, p today she has pain in the sinus tarsi on the right side none left, limited inversion eversion bilaterally but worse on the right  Radiographs: Multiple views x-ray of both feet: Degenerative changes of hindfoot and midfoot    MRIs:  EXAM DESCRIPTION: MR ANKLE LEFT WO CONTRAST   FINDINGS: No fracture. No erosions. Joint space narrowing and osteophytes to the middle subtalar joint suspicious for fibrous coalition. No significant degenerative edema. The marrow signal is unremarkable.   Adequate fat in the sinus tarsi. No joint effusion. The tendons, ligaments, and musculature are unremarkable.   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 No significant degenerative edema.   Electronically signed by: Reyes Frees MD 08/18/2023 07:02 PM EDT RP Workstation: [id number]   EXAM DESCRIPTION: MR ANKLE RIGHT WO CONTRAST   FINDINGS: No fracture. No erosions. Mild marrow edema on either side of the middle facet of the subtalar joint. Joint space narrowing and suspicion of a fibrous coalition as described on prior studies. The marrow edema is improved. Marrow signal otherwise unremarkable.   No joint effusion. Adequate fat in the sinus tarsi. The tendons,  ligaments, and musculature are unremarkable.   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 Mild degenerative marrow edema.   The exam is otherwise unremarkable.   Electronically signed by: Reyes Frees MD 08/18/2023 07:00 PM EDT RP Workstation: [id number] Assessment:   1. Arthritis of right subtalar joint      Plan:  Patient was evaluated and treated and all questions answered.  We reviewed the results of the CT scanogram which showed a limb length discrepancy of 3 cm as well as her MRIs which showed significant arthrosis worse on the right than the left.  We discussed further treatment of this.  With the arthritic changes I discussed with her that I expect this to improve minimally.  I do not think further resection of the coalition would be beneficial either.  I discussed with her that long-term this would require a subtalar joint arthrodesis.  We discussed the risk benefits and potential complications of this including but not limited to pain, swelling, infection, scar, numbness which may be temporary or permanent, chronic pain, stiffness, nerve pain or damage, wound healing problems, bone healing problems including delayed or non-union We also discussed the recovery process including the need for nonweightbearing for 6 to 8 weeks followed by gradual progressive weightbearing in a walking boot.  She understands wishes to proceed.  Outpatient surgery will be scheduled.   Surgical plan:  Procedure: - Right subtalar fusion  Location: - [facility]  Anesthesia plan: - General With regional block  Postoperative pain plan: - Tylenol  1000 mg  every 6 hours,  gabapentin  300 mg every 8 hours x5 days, oxycodone  5 mg 1-2 tabs every 6 hours only as needed  DVT prophylaxis: - Xarelto 10 mg nightly  WB Restrictions / DME needs: - Nonweightbearing in splint postop   No follow-ups on file.

## 2023-09-15 ENCOUNTER — Encounter: Payer: Self-pay | Admitting: Podiatry

## 2023-09-16 ENCOUNTER — Other Ambulatory Visit: Payer: Self-pay | Admitting: Podiatry

## 2023-09-22 ENCOUNTER — Ambulatory Visit: Admitting: Behavioral Health

## 2023-09-26 ENCOUNTER — Ambulatory Visit: Admitting: Behavioral Health

## 2023-10-06 ENCOUNTER — Ambulatory Visit (INDEPENDENT_AMBULATORY_CARE_PROVIDER_SITE_OTHER): Admitting: Behavioral Health

## 2023-10-06 DIAGNOSIS — F419 Anxiety disorder, unspecified: Secondary | ICD-10-CM

## 2023-10-06 DIAGNOSIS — R4589 Other symptoms and signs involving emotional state: Secondary | ICD-10-CM | POA: Diagnosis not present

## 2023-10-10 NOTE — Progress Notes (Signed)
 [facility] Counselor/Therapist Progress Note  Patient ID: Deanna Levine, MRN: [id number],    Date: 10/06/2023  Time Spent: 45 min Caregility video; Pt is in her room in private & Provider working remotely from [employer]. Pt is aware of the risks/limitations of telehealth & consents to Tx today. Time In: 4:00pm Time Out: 4:45pm   Treatment Type: Individual Therapy  Reported Symptoms: Decrease in anx/dep & stress due to resolving foot pain & upcoming trip w/Family to [place] [employer] [place] in [place] [employer].   Mental Status Exam: Appearance:  Casual     Behavior: Appropriate and Sharing  Motor: Normal  Speech/Language:  Clear and Coherent  Affect: Appropriate  Mood: normal  Thought process: normal  Thought content:   WNL  Sensory/Perceptual disturbances:   WNL  Orientation: oriented to person, place, time/date, and situation  Attention: Good  Concentration: Good  Memory: WNL  Fund of knowledge:  Good  Insight:   Good  Judgment:  Good  Impulse Control: Good   Risk Assessment: Danger to Self:  No Self-injurious Behavior: No Danger to Others: No Duty to Warn:no Physical Aggression / Violence:No  Access to Firearms a concern: No  Gang Involvement:No   Subjective: Pt cont's her L foot surgery recovery. Meloxicam  is keeping the pain to a minimum. Her Mother will be having her R foot surgery soon to repair tendons. She will be out of work for sometime & Pt will help her Dad w/the Cleaning.   Pt has many good ideas she wants to implement. [name] keeping up w/her chores in the home & assisting her Parents as much as she can.   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 It only requires a HS Diploma & the thought of working makes Pt happy. She will apply for these type of positions in [place] of 2026. It should not impact her SSI Disability. She does not have any sig exp in this field, but we brainstormed ways  she can present this on her Resume to look professional.   Interventions: Solution-Oriented/Positive Psychology and Ego-Supportive  Diagnosis:Anxiety  Anxiety about health  Plan: Deanna Levine will work on her Resume to discuss next session. She will test her keyboarding skills using a wpm Test online.   Target Date: 11/14/2023  Progress: 7  Frequency: Once every 2-3 wks  Modality: Deanna Richerd LITTIE Hollace, LMFT

## 2023-10-11 ENCOUNTER — Encounter: Payer: Self-pay | Admitting: "Endocrinology

## 2023-10-12 ENCOUNTER — Other Ambulatory Visit: Payer: Self-pay

## 2023-10-12 DIAGNOSIS — E119 Type 2 diabetes mellitus without complications: Secondary | ICD-10-CM

## 2023-10-12 MED ORDER — TIRZEPATIDE 12.5 MG/0.5ML ~~LOC~~ SOAJ: 12.5000 mg | SUBCUTANEOUS | 1 refills | Status: DC

## 2023-10-12 NOTE — Telephone Encounter (Signed)
 Requested Prescriptions   Pending Prescriptions Disp Refills   tirzepatide  (MOUNJARO ) 12.5 MG/0.5ML Pen 6 mL 1    Sig: Inject 12.5 mg into the skin once a week.

## 2023-10-19 ENCOUNTER — Ambulatory Visit: Admitting: Gastroenterology

## 2023-11-03 ENCOUNTER — Ambulatory Visit: Admitting: Behavioral Health

## 2023-11-03 DIAGNOSIS — R4589 Other symptoms and signs involving emotional state: Secondary | ICD-10-CM

## 2023-11-03 DIAGNOSIS — F419 Anxiety disorder, unspecified: Secondary | ICD-10-CM | POA: Diagnosis not present

## 2023-11-03 NOTE — Progress Notes (Signed)
 Deanna Levine  Patient ID: Deanna Levine, MRN: [id number],    Date: 11/03/2023  Time Spent: 40 min Caregility video; Pt is home in private & Provider is working remotely from [employer]. Pt is aware of the risks/limitations of telehealth &  consents to Tx today. Time In: 1:00pm Time Out: 1:40pm   Treatment Type: Individual Therapy  Reported Symptoms: Inc in anx/dep due to foot pain  Mental Status Exam: Appearance:  Casual     Behavior: Appropriate  Motor: Normal  Speech/Language:  Clear and Coherent  Affect: Appropriate  Mood: normal  Thought process: normal  Thought content:   WNL  Sensory/Perceptual disturbances:   WNL  Orientation: oriented to person, place, time/date, and situation  Attention: Good  Concentration: Good  Memory: WNL  Fund of knowledge:  Good  Insight:   Good  Judgment:  Good  Impulse Control: Good   Risk Assessment: Danger to Self:  No  Self-injurious Behavior: No Danger to Others: No Duty to Warn:no Physical Aggression / Violence:No  Access to Firearms a concern: No  Gang Involvement:No   Subjective: Pt realizes the results of living w/her Parents. She has many costs covered & she thinks this is quite valuable.    Interventions: Family Systems  Diagnosis:Anxiety  Anxiety about health  Plan: Mylisa has many health issues she needs her PCP to address. [name] glad to have these  issues handled. She will mind her upcomimg appointments & stay up to date.  Target Date:11/29/2023  Progress:5  Frequency: Once every 2-3 mos  Modality: Deanna Richerd LITTIE Hollace, LMFT

## 2023-11-07 ENCOUNTER — Telehealth: Payer: Self-pay | Admitting: Podiatry

## 2023-11-07 NOTE — Telephone Encounter (Signed)
 The patient's mother, Deanna Levine, reports that she has left several messages but has not received a returned call. [name] inquiring about a date of service in January 2025 (exact date uncertain) with a charge of $180.00, which is now in debit collections.  According to [employer], the claim/visit should have been filed under her Medicare and Medicaid.Could you please look into this and give Deanna Levine a call to provide an update and discuss further?  Thank you.

## 2023-11-08 ENCOUNTER — Ambulatory Visit (INDEPENDENT_AMBULATORY_CARE_PROVIDER_SITE_OTHER): Payer: 59 | Admitting: Podiatry

## 2023-11-08 ENCOUNTER — Encounter: Payer: Self-pay | Admitting: Podiatry

## 2023-11-08 DIAGNOSIS — E119 Type 2 diabetes mellitus without complications: Secondary | ICD-10-CM | POA: Diagnosis not present

## 2023-11-08 DIAGNOSIS — M79675 Pain in left toe(s): Secondary | ICD-10-CM | POA: Diagnosis not present

## 2023-11-08 DIAGNOSIS — B351 Tinea unguium: Secondary | ICD-10-CM | POA: Diagnosis not present

## 2023-11-08 DIAGNOSIS — M79674 Pain in right toe(s): Secondary | ICD-10-CM

## 2023-11-13 NOTE — Progress Notes (Signed)
  Subjective:  Patient ID: Deanna Levine, female    DOB: May 06, 2000,  MRN: [id number]  23 y.o. female presents preventative diabetic foot care for painful mycotic toenails of both feet that are difficult to trim. Pain interferes with daily activities and wearing enclosed shoe gear comfortably. She will be having surgery with Dr. Silva. Chief Complaint  Patient presents with   Nail Problem    Thick painful toenails, 3 month follow up    New problem(s): None   PCP is Rollene [name] LABOR, MD , and last visit was November 18, 2022.  No Known Allergies  Review of Systems: Negative except as noted in the HPI.   Objective:  Deanna Levine is a pleasant 23 y.o. female in NAD. AAO x 3.  Neurovascular status intact b/l and symmetrically.  Dermatological Examination: Well healed surgical scar(s) noted left foot. Pedal skin with normal turgor, texture and tone b/l. No open wounds nor interdigital macerations noted. Toenails 1-5 b/l thick, discolored, elongated with subungual debris and pain on dorsal palpation. No hyperkeratotic lesions noted b/l.   Musculoskeletal Examination: Muscle strength 5/5 to b/l LE.  No pain, crepitus noted b/l. No gross pedal deformities. Patient ambulates independently without assistive aids.   Radiographs: None  Last A1c:      Latest Ref Rng & Units 07/01/2023    8:56 AM  Hemoglobin A1C  Hemoglobin-A1c 4.0 - 5.6 % 5.2      Assessment:   1. Pain due to onychomycosis of toenails of both feet   2. Type 2 diabetes mellitus without complication, without long-term current use of insulin  [facility])    Plan:  Consent given for treatment. Patient examined. All patient's and/or POA's questions/concerns addressed on today's visit. Toenails 1-5 debrided in length and girth without incident. Continue foot and shoe inspections daily. Monitor blood glucose per PCP/Endocrinologist's recommendations. Continue soft, supportive shoe gear daily. Report any pedal injuries to  medical professional. Call office if there are any questions/concerns. -Patient/POA to call should there be question/concern in the interim.  Return in about 9 weeks (around 01/10/2024).  Delon LITTIE Merlin, DPM      [facility] LOCATION: [address], [place] [address]                   Office [phone]   [facility] LOCATION: [address] [place], [place] [address] Office [phone]

## 2023-11-21 ENCOUNTER — Ambulatory Visit: Admitting: Podiatry

## 2023-11-23 ENCOUNTER — Ambulatory Visit (INDEPENDENT_AMBULATORY_CARE_PROVIDER_SITE_OTHER): Admitting: Gastroenterology

## 2023-11-23 ENCOUNTER — Encounter: Payer: Self-pay | Admitting: Gastroenterology

## 2023-11-23 ENCOUNTER — Other Ambulatory Visit (INDEPENDENT_AMBULATORY_CARE_PROVIDER_SITE_OTHER)

## 2023-11-23 VITALS — BP 112/80 | HR 72 | Ht 64.0 in | Wt 192.6 lb

## 2023-11-23 DIAGNOSIS — L732 Hidradenitis suppurativa: Secondary | ICD-10-CM

## 2023-11-23 DIAGNOSIS — R195 Other fecal abnormalities: Secondary | ICD-10-CM

## 2023-11-23 DIAGNOSIS — R197 Diarrhea, unspecified: Secondary | ICD-10-CM | POA: Diagnosis not present

## 2023-11-23 DIAGNOSIS — K529 Noninfective gastroenteritis and colitis, unspecified: Secondary | ICD-10-CM | POA: Diagnosis not present

## 2023-11-23 LAB — SEDIMENTATION RATE: Sed Rate: 51 mm/h — ABNORMAL HIGH (ref 0–20)

## 2023-11-23 LAB — C-REACTIVE PROTEIN: CRP: 1.4 mg/dL (ref 0.5–20.0)

## 2023-11-23 NOTE — Progress Notes (Signed)
 Chief Complaint:    Diarrhea, change in bowel habits  GI History: 23 year old female with a history of  anxiety, obesity, developmental delay, expressive speech disorder, DM II, sleep apnea uses CPAP, epilepsy/absence seizures, (White Sutton Syndrome), hidradenitis suppurativa, chronic diarrhea, microcytic anemia, thrombocytosis, previously followed with Dr. Eda.  - 07/16/2021: Colonoscopy: Localized area of mildly erythematous mucosa in the cecum (path: Severely active chronic nonspecific colitis with ulceration without granulomas or dysplasia), otherwise normal with normal TI. - 07/20/2021: Fecal calprotectin 137 - 08/06/2021: CT enterography: Normal small bowel.  Diverticulosis - 08/27/2021:  Follow-up in GI clinic.  Prescribed Lialda  2.4 g daily (patient never started) - 10/07/2021: Follow-up in the GI clinic.  Prescribed Pentasa  1000 mg tid (never started) and scheduled for EGD for evaluation of pill dysphagia. - 11/23/2021: EGD: Normal esophagus empirically dilated with 18 mm TTS balloon.  Residual food fibers in the stomach, otherwise normal Stomach.  Esophageal, gastric, duodenal biopsies all benign - 02/01/2022: Follow-up in the GI clinic.  Still had not started Pentasa .  Recommended opening capsule and sprinkling granules in applesauce  HPI:     Patient is a 23 y.o. female presenting to the Gastroenterology Clinic for follow-up.  Previously followed with Dr. Eda and was last seen in the GI clinic by Elida Nyle Sharps on 02/01/2022 as above.  She presents to the office with her mother, who is a patient of mine with IBD.  Patient has been having loose, nonbloody stools with 3 BMs/day.  Does have a longstanding history of diarrhea, but symptoms seem to be worse over the last year or so since starting Mounjaro .  She did see some improvement after decreasing 15 mg --> 12.5 mg.  No hematochezia or melena.  Previously used Pentasa  (breaks open capsules) and thinks sxs were better when  using it. Has since stopped taking.   Follows in the Dermatology Clinic for hidradenitis suppurativa, last seen 09/07/2023 and prescribed Humira, but has not yet started (waiting on a phone call).   Reviewed most recent labs from 06/2023: Normal CMP, A1c 5.2%.  Reviewed labs from 10/2022: H/H 10.3/33.5 with MCV/RDW 69/16.4, ferritin 58, B12 400, vitamin D  24, CMP normal.  No recent abdominal imaging for review.  Review of systems:     No chest pain, no SOB, no fevers, no urinary sx   Past Medical History:  Diagnosis Date   Absence seizure disorder (HCC)    Allergy     Colitis 07/16/2021   Complication of anesthesia    slow to wake up from anesthesia, occ HA with anesthesia    Diabetes mellitus without complication (HCC)    type 2   Difficulty swallowing pills    Dyspraxia    Eczema    Global developmental delay    Obesity (BMI 30-39.9)    PONV (postoperative nausea and vomiting)    Precocious female puberty    Seizures (HCC)    last seizure 2012 - no current med   Sleep apnea    White-Sutton syndrome     Patient's surgical history, family medical history, social history, medications and allergies were all reviewed in Epic    Current Outpatient Medications  Medication Sig Dispense Refill   azelastine  (ASTELIN ) 0.1 % nasal spray Place 2 sprays into both nostrils 2 (two) times daily. Use in each nostril as directed     Blood Glucose Monitoring Suppl DEVI 1 each by Does not apply route in the morning, at noon, and at bedtime. May substitute to any manufacturer covered  by patient's insurance. 1 each 0   cetirizine (ZYRTEC) 10 MG tablet Take 10 mg by mouth daily as needed for allergies.     chlorhexidine  (HIBICLENS ) 4 % external liquid Apply 1 Application topically daily as needed.     clindamycin -benzoyl peroxide (BENZACLIN) gel Apply 1 Application topically 2 (two) times daily.     Continuous Glucose Sensor (DEXCOM G7 SENSOR) MISC 1 Device by Does not apply route continuous. 9 each 0    Fiber Adult Gummies 2 g CHEW Chew 1 tablet by mouth daily.     fluticasone  (FLONASE ) 50 MCG/ACT nasal spray SHAKE LIQUID AND USE 2 SPRAYS IN EACH NOSTRIL DAILY 48 g 1   glucose blood (ACCU-CHEK GUIDE TEST) test strip TEST IN THE MORNING, AT NOON, AND AT BEDTIME 100 strip 5   ketoconazole  (NIZORAL ) 2 % cream Apply to both feet and between toes once daily for 6 weeks. 60 g 1   meloxicam  (MOBIC ) 15 MG tablet TAKE 1 TABLET(15 MG) BY MOUTH DAILY 30 tablet 3   MENAQUINONE-7 PO Take 100 mcg by mouth daily.     Na Sulfate-K Sulfate-Mg Sulf 17.5-3.13-1.6 GM/177ML SOLN Take 177 mLs by mouth See admin instructions.     ondansetron  (ZOFRAN -ODT) 4 MG disintegrating tablet Take 1 tablet (4 mg total) by mouth every 8 (eight) hours as needed for nausea or vomiting. 20 tablet 0   Propylene Glycol (SYSTANE BALANCE OP) Apply to eye.     tirzepatide  (MOUNJARO ) 12.5 MG/0.5ML Pen Inject 12.5 mg into the skin once a week. 6 mL 1   triamcinolone  cream (KENALOG ) 0.1 % Apply to rough area of feet twice daily. 454 g 0   zonisamide  (ZONEGRAN ) 100 MG capsule TAKE 2 CAPSULES BY MOUTH DAILY 180 capsule 4   No current facility-administered medications for this visit.    Physical Exam:     BP 112/80   Pulse 72   Ht 5' 4 (1.626 m)   Wt 192 lb 9.6 oz (87.4 kg)   LMP 10/27/2023 (Approximate)   BMI 33.06 kg/m   GENERAL:  Pleasant female in NAD PSYCH: : Cooperative, normal affect EENT:  conjunctiva pink, mucous membranes moist, neck supple without masses CARDIAC:  RRR, no murmur heard, no peripheral edema PULM: Normal respiratory effort, lungs CTA bilaterally, no wheezing ABDOMEN:  Nondistended, soft, nontender. No obvious masses, no hepatomegaly,  normal bowel sounds SKIN:  turgor, no lesions seen Musculoskeletal:  Normal muscle tone, normal strength NEURO: Alert and oriented x 3, no focal neurologic deficits   IMPRESSION and PLAN:    1) Diarrhea 2) Colitis on colonoscopy 3) Hidradenitis suppurativa 4)  Change in bowel habits 23 year old female with longstanding history of chronic loose stools.  Initially normal fecal calprotectin in 04/2021, but colonoscopy in 06/2021 with localized area of mildly erythematous mucosa in the cecum and biopsies showing severe active and chronic nonspecific colitis.  Repeat calprotectin at that time was then elevated at 137.  Had prescribed Lialda , but patient never started.  Prescribed Pentasa  and opened capsules and patient reports clinical improvement.  However, she stopped taking for unknown reasons.  Discussed symptoms and prior endoscopic/histologic findings at length today.  Unclear if she has focal Crohn's of the cecum.  Would typically plan to reinitiate therapy now and observe for clinical improvement, however her Dermatologist is planning on starting Humira for hidradenitis suppurativa.  If this is in fact Crohn's, that should certainly be effective.  Plan for the following:  - Check fecal calprotectin, ESR, CRP today -  Will get me the other labs recently done through [facility] for review.  [name] otherwise scheduled for follow-up with her PCP next week and planning on labs - I recommended that she call her Dermatologist for Humira induction  I spent 35 minutes of time, including in depth chart review, independent review of results as outlined above, communicating results with the patient directly, face-to-face time with the patient, coordinating care, and ordering studies and medications as appropriate, and documentation.            Sandor LULLA Flatter ,DO, FACG 11/23/2023, 8:58 AM

## 2023-11-23 NOTE — Patient Instructions (Signed)
 _______________________________________________________  If your blood pressure at your visit was 140/90 or greater, please contact your primary care physician to follow up on this.  _______________________________________________________  If you are age 23 or older, your body mass index should be between 23-30. Your Body mass index is 33.06 kg/m. If this is out of the aforementioned range listed, please consider follow up with your Primary Care Provider.  If you are age 60 or younger, your body mass index should be between 19-25. Your Body mass index is 33.06 kg/m. If this is out of the aformentioned range listed, please consider follow up with your Primary Care Provider.   ________________________________________________________  The [facility] providers would like to encourage you to use MYCHART to communicate with providers for non-urgent requests or questions.  Due to long hold times on the telephone, sending your provider a message by [facility] may be a faster and more efficient way to get a response.  Please allow 48 business hours for a response.  Please remember that this is for non-urgent requests.  _______________________________________________________  [facility] is using a team-based approach to care.  Your team is made up of your doctor and two to three APPS. Our APPS (Nurse Practitioners and Physician Assistants) work with your physician to ensure care continuity for you. They are fully qualified to address your health concerns and develop a treatment plan. They communicate directly with your gastroenterologist to care for you. Seeing the Advanced Practice Practitioners on your physician's team can help you by facilitating care more promptly, often allowing for earlier appointments, access to diagnostic testing, procedures, and other specialty referrals.   Your provider has requested that you go to the basement level for lab work before leaving today. Press B on the  elevator. The lab is located at the first door on the left as you exit the elevator.  Due to recent changes in healthcare laws, you may see the results of your imaging and laboratory studies on MyChart before your provider has had a chance to review them.  We understand that in some cases there may be results that are confusing or concerning to you. Not all laboratory results come back in the same time frame and the provider may be waiting for multiple results in order to interpret others.  Please give us  48 hours in order for your provider to thoroughly review all the results before contacting the office for clarification of your results.   It was a pleasure to see you today!  Vito Cirigliano, D.O.

## 2023-11-24 ENCOUNTER — Ambulatory Visit: Admitting: Behavioral Health

## 2023-11-24 DIAGNOSIS — F419 Anxiety disorder, unspecified: Secondary | ICD-10-CM | POA: Diagnosis not present

## 2023-11-24 DIAGNOSIS — R4589 Other symptoms and signs involving emotional state: Secondary | ICD-10-CM

## 2023-11-24 LAB — HEPATITIS B SURFACE ANTIBODY,QUALITATIVE: Hep B S Ab: REACTIVE — AB

## 2023-11-24 LAB — HEPATITIS B SURFACE ANTIGEN: Hepatitis B Surface Ag: NONREACTIVE

## 2023-11-24 NOTE — Progress Notes (Signed)
 [facility] Counselor/Therapist Progress Note  Patient ID: Duane Trias, MRN: [id number],    Date: 11/29/2023  Time Spent: 30 min Caregility video; Pt @ home in private & Provider working remotely from [employer]. Pt is aware of the risks/limitations of telehealth & consents to Tx today. Time In: 1:00pm Time Out: 1:30pm   Treatment Type: Individual Therapy  Reported Symptoms: Reduction in stress, anx/dep  Mental Status Exam: Appearance:  Casual     Behavior: Appropriate and Sharing  Motor: Normal  Speech/Language:  Clear and Coherent  Affect: Appropriate  Mood: normal  Thought process: normal  Thought content:   WNL  Sensory/Perceptual disturbances:   WNL  Orientation: oriented to person, place, time/date, and situation  Attention: Good  Concentration: Good  Memory: WNL  Fund of knowledge:  Good  Insight:   Fair  Judgment:  Good  Impulse Control: Good   Risk Assessment: Danger to Self:  No Self-injurious Behavior: No Danger to Others: No Duty to Warn:no Physical Aggression / Violence:No  Access to Firearms a concern: No  Gang Involvement:No   Subjective: Pt is still dealing w/healing from her foot surgery & props it up & uses meloxicam . She will be initiating Humira soon for her GI issues. The Family has many Bday celebrations in Sept, incl'g her Mother.   Pt is in the midst of comparing [employer] & [employer] to see which system works better to support her needs. She wants to go online & earn some money.  Today, Pt plans to dust & clean her room, put on Christmas sheets for her bed & wash the curtains. She likes to keep busy. Overall, Pt sts she is doing well.    Interventions: Family Systems  Diagnosis:Anxiety  Anxiety about health  Plan: Eowyn cont's to care for her foot & prepare for other foot surg in Dec. [name] excited for all the activities in the upcoming Season. She will use her Notebook as needed to record her thoughts, feelings, &  concerns for our next session.   Target Date:12/29/2023  Progress:7  Frequency: Every 3 wks  Modality: [name], LMFT

## 2023-11-25 ENCOUNTER — Ambulatory Visit

## 2023-11-25 ENCOUNTER — Ambulatory Visit: Payer: Self-pay | Admitting: Gastroenterology

## 2023-11-25 ENCOUNTER — Encounter: Admitting: Internal Medicine

## 2023-11-25 LAB — CALPROTECTIN, FECAL: Calprotectin, Fecal: 259 ug/g — ABNORMAL HIGH (ref 0–120)

## 2023-11-28 ENCOUNTER — Ambulatory Visit (INDEPENDENT_AMBULATORY_CARE_PROVIDER_SITE_OTHER): Admitting: Internal Medicine

## 2023-11-28 VITALS — BP 114/80 | HR 80 | Temp 97.9°F | Ht 64.0 in | Wt 187.0 lb

## 2023-11-28 DIAGNOSIS — R620 Delayed milestone in childhood: Secondary | ICD-10-CM

## 2023-11-28 DIAGNOSIS — Z0000 Encounter for general adult medical examination without abnormal findings: Secondary | ICD-10-CM

## 2023-11-28 DIAGNOSIS — Z23 Encounter for immunization: Secondary | ICD-10-CM

## 2023-11-28 DIAGNOSIS — E11628 Type 2 diabetes mellitus with other skin complications: Secondary | ICD-10-CM

## 2023-11-28 DIAGNOSIS — L732 Hidradenitis suppurativa: Secondary | ICD-10-CM

## 2023-11-28 DIAGNOSIS — N938 Other specified abnormal uterine and vaginal bleeding: Secondary | ICD-10-CM | POA: Diagnosis not present

## 2023-11-28 NOTE — Patient Instructions (Signed)
 The stomach medicine is called pentasa  capsules.

## 2023-11-28 NOTE — Progress Notes (Unsigned)
   Subjective:   Patient ID: Deanna Levine, female    DOB: Feb 15, 2001, 23 y.o.   MRN: [id number]  The patient is here for physical. Pertinent topics discussed: Discussed the use of AI scribe software for clinical note transcription with the patient, who gave verbal consent to proceed.  History of Present Illness Deanna Levine is a 23 year old female with hidradenitis suppurativa and gastrointestinal issues who presents for follow-up regarding her treatment plan. [name] accompanied by her mother.  She has been experiencing worsening gastrointestinal symptoms, including increased diarrhea and cramping. There is a history of elevated inflammatory markers, and recent stool tests have shown inflammation. She has previously used Pentaza, which was effective for her gastrointestinal symptoms.  She has hidradenitis suppurativa with painful sores on her chest, underarms, and buttocks, causing significant discomfort and impacting her daily life. There have been delays in initiating treatment with Humira.  She experiences severe menstrual cramps, exacerbated by her family history of endometriosis and fibroids. She has had difficulty finding an OBGYN who accepts Medicaid and is comfortable for her. Previous attempts to manage her menstrual symptoms included a contraceptive implant, which was removed due to breakthrough bleeding.  She has a history of fluid buildup in her ears, leading to frequent wax discharge. She reports no breathing difficulties or chest pain.  [name] scheduled for major foot surgery in December, which will involve fusing her foot and inserting screws. She expresses concern about managing her care post-surgery due to her mother's physical limitations. PMH, [facility], social history reviewed and updated  Review of Systems  Constitutional:  Positive for activity change.  HENT: Negative.    Eyes: Negative.   Respiratory:  Negative for cough, chest tightness and shortness of breath.    Cardiovascular:  Negative for chest pain, palpitations and leg swelling.  Gastrointestinal:  Positive for abdominal pain and diarrhea. Negative for abdominal distention, constipation, nausea and vomiting.  Musculoskeletal:  Positive for arthralgias, gait problem and myalgias.  Skin:  Positive for wound.  Psychiatric/Behavioral: Negative.      Objective:  Physical Exam Constitutional:      Appearance: [name] well-developed. [name] obese.  HENT:     Head: Normocephalic and atraumatic.  Cardiovascular:     Rate and Rhythm: Normal rate and regular rhythm.  Pulmonary:     Effort: Pulmonary effort is normal. No respiratory distress.     Breath sounds: Normal breath sounds. No wheezing or rales.  Abdominal:     General: Bowel sounds are normal. There is no distension.     Palpations: Abdomen is soft.     Tenderness: There is no abdominal tenderness.  Musculoskeletal:        General: Tenderness present.     Cervical back: Normal range of motion.  Skin:    General: Skin is warm and dry.  Neurological:     Mental Status: [name] alert and oriented to person, place, and time.     Coordination: Coordination normal.     Vitals:   11/28/23 0823  BP: 114/80  Pulse: 80  Temp: 97.9 F (36.6 C)  TempSrc: Oral  SpO2: 98%  Weight: 187 lb (84.8 kg)  Height: 5' 4 (1.626 m)    Assessment & Plan:  Flu and prevnar 20 given at visit

## 2023-11-29 ENCOUNTER — Ambulatory Visit (INDEPENDENT_AMBULATORY_CARE_PROVIDER_SITE_OTHER)

## 2023-11-29 ENCOUNTER — Ambulatory Visit (INDEPENDENT_AMBULATORY_CARE_PROVIDER_SITE_OTHER): Admitting: Podiatry

## 2023-11-29 DIAGNOSIS — R2689 Other abnormalities of gait and mobility: Secondary | ICD-10-CM | POA: Diagnosis not present

## 2023-11-29 DIAGNOSIS — M2142 Flat foot [pes planus] (acquired), left foot: Secondary | ICD-10-CM | POA: Diagnosis not present

## 2023-11-29 DIAGNOSIS — M2141 Flat foot [pes planus] (acquired), right foot: Secondary | ICD-10-CM

## 2023-11-29 DIAGNOSIS — M19071 Primary osteoarthritis, right ankle and foot: Secondary | ICD-10-CM

## 2023-11-29 NOTE — Progress Notes (Signed)
 Subjective:  Patient ID: Deanna Levine, female    DOB: 05-Aug-2000,  MRN: [id number]  Chief Complaint  Patient presents with   Routine Post Op    Pt still having some bilateral pain in arch when standing for long periods.  Type 2 Diabetic A1c 5.3  no anti coag     23 y.o. female presents with the above complaint. History confirmed with patient.  No major changes in symptoms the have some questions about the postop recovery process  Objective:  Physical Exam: warm, good capillary refill, no trophic changes or ulcerative lesions, normal DP and PT pulses, normal sensory exam, and pain and limited range of motion of ankle and subtalar joint, p today she has pain in the sinus tarsi on the right side none left, limited inversion eversion bilaterally but worse on the right  Radiographs: Multiple views x-ray of both feet: Degenerative changes of hindfoot and midfoot, no change in interval x-rays taken today   MRIs:  EXAM DESCRIPTION: MR ANKLE LEFT WO CONTRAST   FINDINGS: No fracture. No erosions. Joint space narrowing and osteophytes to the middle subtalar joint suspicious for fibrous coalition. No significant degenerative edema. The marrow signal is unremarkable.   Adequate fat in the sinus tarsi. No joint effusion. The tendons, ligaments, and musculature are unremarkable.   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 No significant degenerative edema.   Electronically signed by: Reyes Frees MD 08/18/2023 07:02 PM EDT RP Workstation: [id number]   EXAM DESCRIPTION: MR ANKLE RIGHT WO CONTRAST   FINDINGS: No fracture. No erosions. Mild marrow edema on either side of the middle facet of the subtalar joint. Joint space narrowing and suspicion of a fibrous coalition as described on prior studies. The marrow edema is improved. Marrow signal otherwise unremarkable.   No joint effusion. Adequate fat in  the sinus tarsi. The tendons, ligaments, and musculature are unremarkable.   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 Mild degenerative marrow edema.   The exam is otherwise unremarkable.   Electronically signed by: Reyes Frees MD 08/18/2023 07:00 PM EDT RP Workstation: [id number] Assessment:   1. Pes planus of both feet   2. Arthritis of right subtalar joint   3. Inability to bear weight      Plan:  Patient was evaluated and treated and all questions answered.  We again discussed the proposed surgical plan there is no update to the x-ray today to indicate a change in the plan.  Will plan for subtalar arthrodesis this is tentatively scheduled for February 03, 2024.  We discussed the recovery process and inability to bear weight following surgery.  Her father is going to be working after this and her mother who had recent surgery will not be able to fully care for she will require home health care.  Referral for PT OT and home health aide has been placed as well as DME for shower chair walker and bedside commode.  She will follow-up with me in about 1 month for permanent removal of the bilateral hallux nail and right third toenail otherwise we will see her at surgery for the issue.   Surgical plan:  Procedure: - Right subtalar fusion  Location: - [facility]  Anesthesia plan: - General With regional block  Postoperative pain plan: - Tylenol  1000 mg every 6 hours,  gabapentin  300 mg every 8  hours x5 days, oxycodone  5 mg 1-2 tabs every 6 hours only as needed  DVT prophylaxis: - Xarelto 10 mg nightly  WB Restrictions / DME needs: - Nonweightbearing in splint postop   Return in about 1 month (around 12/29/2023) for permanent nail removals (b/l 1st, R 3).

## 2023-12-01 ENCOUNTER — Encounter: Payer: Self-pay | Admitting: Internal Medicine

## 2023-12-01 NOTE — Assessment & Plan Note (Signed)
 Flu shot given. Pneumonia given 20. Tetanus up to date. pap smear up to date. Counseled about sun safety and mole surveillance. Counseled about the dangers of distracted driving. Given 10 year screening recommendations.

## 2023-12-01 NOTE — Assessment & Plan Note (Signed)
 Referral to gyn and previously had to have pap smear under anesthesia. From mom description it sounds like she had nexplanon  which had to be removed due to ineffective. She does have heavy bleeding and cramping and would like to not have periods. There is some developmental delay causing some challenge with personal hygiene around periods.

## 2023-12-01 NOTE — Assessment & Plan Note (Signed)
 Seeing endo for labs. Given prevnar 20 at visit and encouraged to continue same management.

## 2023-12-01 NOTE — Assessment & Plan Note (Signed)
 Stable overall but there are challenges with activity and maintaining personal hygiene tasks. Lives with mom and dad.

## 2023-12-01 NOTE — Assessment & Plan Note (Signed)
 Discussed derm plan to start humira and encouraged patient and her mom to follow up with derm to start to help GI and HS symptoms.

## 2023-12-15 ENCOUNTER — Ambulatory Visit: Admitting: Behavioral Health

## 2023-12-15 DIAGNOSIS — F419 Anxiety disorder, unspecified: Secondary | ICD-10-CM

## 2023-12-15 DIAGNOSIS — R4589 Other symptoms and signs involving emotional state: Secondary | ICD-10-CM | POA: Diagnosis not present

## 2023-12-15 NOTE — Progress Notes (Signed)
 [facility] Counselor/Therapist Progress Note  Patient ID: Deanna Levine, MRN: [id number],    Date: 12/15/2023  Time Spent: 30 min Caregility video; Pt is in her room @ home in private & Provider working remotely from [facility]. Pt is aware of the risks/limitations of telehealth & consents to Tx today. Time In: 1:00pm Time Out: 1:30pm   Treatment Type: Individual Therapy  Reported Symptoms: Elevated anx/dep & stress due to health issues  Mental Status Exam: Appearance:  Casual     Behavior: Sharing  Motor: Normal  Speech/Language:  Clear and Coherent  Affect: Appropriate  Mood: anxious  Thought process: normal  Thought content:   WNL  Sensory/Perceptual disturbances:   WNL  Orientation: oriented to person, place, time/date, and situation  Attention: Good  Concentration: Good  Memory: WNL  Fund of knowledge:  Good  Insight:   Fair  Judgment:  Good  Impulse Control: Good   Risk Assessment: Danger to Self:  No Self-injurious Behavior: No Danger to Others: No Duty to Warn:no Physical Aggression / Violence:No  Access to Firearms a concern: No  Gang Involvement:No   Subjective: Pt is preparing for foot surgery in Dec 2025. Parents are both there for Pt to support her in the surgical process. She has this prep -ared for her own plans to support her recovery. Pt's Bros has been present much more lately than before now.   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 Her Sleep Hygiene has been disrupted due to her early morning sleep initiation @ 7:00am. The Family is attending the [facility] on Sat & cleaning bldgs on Sun.   [place] Date: 01/14/2024  Progress: 6  Frequency: Once every 2-3 wks  Modality: [name], LMFT

## 2023-12-22 ENCOUNTER — Ambulatory Visit (INDEPENDENT_AMBULATORY_CARE_PROVIDER_SITE_OTHER)

## 2023-12-22 VITALS — Ht 64.0 in | Wt 187.0 lb

## 2023-12-22 DIAGNOSIS — Z0000 Encounter for general adult medical examination without abnormal findings: Secondary | ICD-10-CM | POA: Diagnosis not present

## 2023-12-22 NOTE — Patient Instructions (Addendum)
 Deanna Levine,  Thank you for taking the time for your Medicare Wellness Visit. I appreciate your continued commitment to your health goals. Please review the care plan we discussed, and feel free to reach out if I can assist you further.  Medicare recommends these wellness visits once per year to help you and your care team stay ahead of potential health issues. These visits are designed to focus on prevention, allowing your provider to concentrate on managing your acute and chronic conditions during your regular appointments.  Please note that Annual Wellness Visits do not include a physical exam. Some assessments may be limited, especially if the visit was conducted virtually. If needed, we may recommend a separate in-person follow-up with your provider.  Ongoing Care Seeing your primary care provider every 3 to 6 months helps us  monitor your health and provide consistent, personalized care.   Referrals If a referral was made during today's visit and you haven't received any updates within two weeks, please contact the referred provider directly to check on the status.  Recommended Screenings:  Health Maintenance  Topic Date Due   HPV Vaccine (1 - Risk 3-dose series) Never done   HIV Screening  Never done   Hepatitis C Screening  Never done   DTaP/Tdap/Td vaccine (1 - Tdap) Never done   Hepatitis B Vaccine (1 of 3 - 19+ 3-dose series) Never done   COVID-19 Vaccine (3 - Pfizer risk series) 07/05/2019   Eye exam for diabetics  12/23/2023   Hemoglobin A1C  01/01/2024   Complete foot exam   05/23/2024   Yearly kidney function blood test for diabetes  06/30/2024   Yearly kidney health urinalysis for diabetes  06/30/2024   Pap Smear  11/23/2024   Medicare Annual Wellness Visit  12/21/2024   Pneumococcal Vaccine  Completed   Flu Shot  Completed   Meningitis B Vaccine  Completed       12/22/2023   12:35 PM  Advanced Directives  Does Patient Have a Medical Advance Directive? Yes   Type of Estate agent of [place];Living will  Copy of Healthcare Power of Attorney in Chart? No - copy requested   Advance Care Planning is important because it: Ensures you receive medical care that aligns with your values, goals, and preferences. Provides guidance to your family and loved ones, reducing the emotional burden of decision-making during critical moments.  Vision: Annual vision screenings are recommended for early detection of glaucoma, cataracts, and diabetic retinopathy. These exams can also reveal signs of chronic conditions such as diabetes and high blood pressure.  Dental: Annual dental screenings help detect early signs of oral cancer, gum disease, and other conditions linked to overall health, including heart disease and diabetes.

## 2023-12-22 NOTE — Progress Notes (Signed)
 Subjective:  Please attest and cosign this visit due to patients primary care provider not being in the office at the time the visit was completed.  (Pt of Dr [name] Cleveland)   Deanna Levine is a 23 y.o. who presents for a Medicare Wellness preventive visit.  As a reminder, Annual Wellness Visits don't include a physical exam, and some assessments may be limited, especially if this visit is performed virtually. We may recommend an in-person follow-up visit with your provider if needed.  Visit Complete: Virtual I connected with  Kielee Laird on 12/22/23 by a audio enabled telemedicine application and verified that I am speaking with the correct person using two identifiers.  Patient Location: Home  Provider Location: Office/Clinic  I discussed the limitations of evaluation and management by telemedicine. The patient expressed understanding and agreed to proceed.  Vital Signs: Because this visit was a virtual/telehealth visit, some criteria may be missing or patient reported. Any vitals not documented were not able to be obtained and vitals that have been documented are patient reported.  VideoDeclined- This patient declined Librarian, academic. Therefore the visit was completed with audio only.  Persons Participating in Visit: Patient assisted by Mother, Annetta Dicola.  AWV Questionnaire: No: Patient Medicare AWV questionnaire was not completed prior to this visit.  Cardiac Risk Factors include: advanced age (>87men, >68 women);diabetes mellitus;obesity (BMI >30kg/m2)     Objective:    Today's Vitals   12/22/23 1235  Weight: 187 lb (84.8 kg)  Height: 5' 4 (1.626 m)   Body mass index is 32.1 kg/m.     12/22/2023   12:35 PM 08/15/2023    8:30 AM 01/25/2023    2:41 PM 01/13/2023    8:36 AM 08/19/2022   11:07 AM 07/05/2022    9:52 AM 04/26/2022   11:29 AM  Advanced Directives  Does Patient Have a Medical Advance Directive? Yes Yes Yes  Yes No No No  Type of Estate agent of [place];Living will Healthcare Power of [employer] Power of [employer] Power of Attorney     Copy of Healthcare Power of Attorney in Chart? No - copy requested        Would patient like information on creating a medical advance directive?     Yes (Inpatient - patient defers creating a medical advance directive at this time - Information given)      Current Medications (verified) Outpatient Encounter Medications as of 12/22/2023  Medication Sig   azelastine  (ASTELIN ) 0.1 % nasal spray Place 2 sprays into both nostrils 2 (two) times daily. Use in each nostril as directed   Blood Glucose Monitoring Suppl DEVI 1 each by Does not apply route in the morning, at noon, and at bedtime. May substitute to any manufacturer covered by patient's insurance.   cetirizine (ZYRTEC) 10 MG tablet Take 10 mg by mouth daily as needed for allergies.   chlorhexidine  (HIBICLENS ) 4 % external liquid Apply 1 Application topically daily as needed.   clindamycin -benzoyl peroxide (BENZACLIN) gel Apply 1 Application topically 2 (two) times daily.   Continuous Glucose Sensor (DEXCOM G7 SENSOR) MISC 1 Device by Does not apply route continuous.   Fiber Adult Gummies 2 g CHEW Chew 1 tablet by mouth daily.   fluticasone  (FLONASE ) 50 MCG/ACT nasal spray SHAKE LIQUID AND USE 2 SPRAYS IN EACH NOSTRIL DAILY   glucose blood (ACCU-CHEK GUIDE TEST) test strip TEST IN THE MORNING, AT NOON, AND AT BEDTIME   ketoconazole  (NIZORAL ) 2 %  cream Apply to both feet and between toes once daily for 6 weeks.   meloxicam  (MOBIC ) 15 MG tablet TAKE 1 TABLET(15 MG) BY MOUTH DAILY   MENAQUINONE-7 PO Take 100 mcg by mouth daily.   Na Sulfate-K Sulfate-Mg Sulf 17.5-3.13-1.6 GM/177ML SOLN Take 177 mLs by mouth See admin instructions.   ondansetron  (ZOFRAN -ODT) 4 MG disintegrating tablet Take 1 tablet (4 mg total) by mouth every 8 (eight) hours as needed for nausea or vomiting.    Propylene Glycol (SYSTANE BALANCE OP) Apply to eye.   tirzepatide  (MOUNJARO ) 12.5 MG/0.5ML Pen Inject 12.5 mg into the skin once a week.   triamcinolone  cream (KENALOG ) 0.1 % Apply to rough area of feet twice daily.   zonisamide  (ZONEGRAN ) 100 MG capsule TAKE 2 CAPSULES BY MOUTH DAILY   No facility-administered encounter medications on file as of 12/22/2023.    Allergies (verified) Patient has no known allergies.   History: Past Medical History:  Diagnosis Date   Absence seizure disorder (HCC)    Allergy     Colitis 07/16/2021   Complication of anesthesia    slow to wake up from anesthesia, occ HA with anesthesia    Diabetes mellitus without complication (HCC)    type 2   Difficulty swallowing pills    Dyspraxia    Eczema    Global developmental delay    Obesity (BMI 30-39.9)    PONV (postoperative nausea and vomiting)    Precocious female puberty    Seizures (HCC)    last seizure 2012 - no current med   Sleep apnea    White-Sutton syndrome    Past Surgical History:  Procedure Laterality Date   ADENOIDECTOMY     BALLOON DILATION N/A 11/23/2021   Procedure: BALLOON DILATION;  Surgeon: Eda Iha, MD;  Location: [facility] ENDOSCOPY;  Service: Gastroenterology;  Laterality: N/A;   BIOPSY  07/16/2021   Procedure: BIOPSY;  Surgeon: Eda Iha, MD;  Location: [facility] END[facility]COPY;  Service: Gastroenterology;;   BIOPSY  11/23/2021   Procedure: BIOPSY;  Surgeon: Eda Iha, MD;  Location: [facility];  Service: Gastroenterology;;   bone removal Left 01/16/2021   foot   COLONOSCOPY WITH PROPOFOL  N/A 07/16/2021   Procedure: COLONOSCOPY WITH PROPOFOL ;  Surgeon: Eda Iha, MD;  Location: [facility] ENDOSCOPY;  Service: Gastroenterology;  Laterality: N/A;   ESOPHAGOGASTRODUODENOSCOPY (EGD) WITH PROPOFOL  N/A 11/23/2021   Procedure: ESOPHAGOGASTRODUODENOSCOPY (EGD) WITH PROPOFOL ;  Surgeon: Eda Iha, MD;  Location: [facility] ENDOS[facility];  Service: Gastroenterology;   Laterality: N/A;   FOOT SURGERY Right 11/2022   GASTROCNEMIUS RECESSION Bilateral 04/25/2020   Procedure: BILATERAL GASTROCNEMIUS RECESSION;  Surgeon: Harden Jerona GAILS, MD;  Location: [facility] OR;  Service: Orthopedics;  Laterality: Bilateral;   SUPPRELIN  IMPLANT  05/06/2011   Procedure: SUPPRELIN  IMPLANT;  Surgeon: CHRISTELLA. Julietta Millman, MD;  Location: [facility];  Service: Pediatrics;  Laterality: Right;   SUPPRELIN  IMPLANT Right 06/14/2013   Procedure: REMOVAL OF SUPPRELIN  IMPLANT FROM RIGHT UPPER ARM;  Surgeon: CHRISTELLA. Julietta Millman, MD;  Location: [facility];  Service: Pediatrics;  Laterality: Right;   TONSILLECTOMY     TONSILLECTOMY AND ADENOIDECTOMY     TYMPANOSTOMY TUBE PLACEMENT     x 2   Family History  Problem Relation Age of Onset   Diabetes Mother        steroid induced; hx. colitis   Anesthesia problems Mother        severe headache lasting 2-3 days   Rheum arthritis Mother    Ulcerative colitis Mother  Fibromyalgia Mother    Henoch-Schonlein purpura Brother        in remission   Hypertension Maternal Grandmother    Hyperlipidemia Maternal Grandmother    Diabetes Maternal Grandmother    Kidney disease Maternal Grandfather    Hypertension Maternal Grandfather    Heart disease Maternal Grandfather    Hyperlipidemia Maternal Grandfather    Kidney disease Paternal Grandmother    Hypertension Paternal Grandmother    Diabetes Paternal Grandmother    Heart disease Paternal Grandmother    Hyperlipidemia Paternal Grandmother    Hypertension Paternal Grandfather    Hyperlipidemia Paternal Grandfather    Asthma Maternal Aunt    Seizures Maternal Uncle    Colon cancer Other        great aunt   Esophageal cancer Neg Hx    Esophageal varices Neg Hx    Pancreatic disease Neg Hx    Social History   Socioeconomic History   Marital status: Single    Spouse name: Not on file   Number of children: 0   Years of education: 12   Highest education level:  12th grade  Occupational History   Occupation: [employer]  Tobacco Use   Smoking status: Never    Passive exposure: Never   Smokeless tobacco: Never  Vaping Use   Vaping status: Never Used  Substance and Sexual Activity   Alcohol use: No    Alcohol/week: 0.0 standard drinks of alcohol   Drug use: No   Sexual activity: Not Currently    Birth control/protection: None  Other Topics Concern   Not on file  Social History Narrative   Ailish is graduated. [name] doing average.    She enjoys playing softball and basketball.   Lives with her parents.    Not working.   Left handed   Two story home      Had surgery on her legs in Feb and is physical therapy twice a week.   Left handed   No caffeine   Social Drivers of Corporate investment banker Strain: Low Risk  (12/22/2023)   Overall Financial Resource Strain (CARDIA)    Difficulty of Paying Living Expenses: Not hard at all  Food Insecurity: No Food Insecurity (12/22/2023)   Hunger Vital Sign    Worried About Running Out of Food in the Last Year: Never true    Ran Out of Food in the Last Year: Never true  Transportation Needs: No Transportation Needs (12/22/2023)   PRAPARE - Administrator, Civil Service (Medical): No    Lack of Transportation (Non-Medical): No  Physical Activity: Inactive (12/22/2023)   Exercise Vital Sign    Days of Exercise per Week: 0 days    Minutes of Exercise per Session: 0 min  Stress: No Stress Concern Present (12/22/2023)   [employer] of Occupational Health - Occupational Stress Questionnaire    Feeling of Stress: Not at all  Social Connections: Socially Isolated (12/22/2023)   Social Connection and Isolation Panel    Frequency of Communication with Friends and Family: Once a week    Frequency of Social Gatherings with Friends and Family: Once a week    Attends Religious Services: More than 4 times per year    Active Member of [employer] or Organizations: No    Attends Tax inspector Meetings: Never    Marital Status: Never married    Tobacco Counseling Counseling given: Not Answered    Clinical Intake:  Pre-visit preparation completed: Yes  Pain : No/denies pain     BMI - recorded: 32.1 Nutritional Status: BMI > 30  Obese Nutritional Risks: None Diabetes: Yes CBG done?: No Did pt. bring in CBG monitor from home?: No  Lab Results  Component Value Date   HGBA1C 5.2 07/01/2023   HGBA1C 5.1 08/31/2022   HGBA1C 5.1 06/01/2022     How often do you need to have someone help you when you read instructions, pamphlets, or other written materials from your doctor or pharmacy?: 5 - Always (Mother assists)  Interpreter Needed?: No  Information entered by :: Verdie Saba, CMA   Activities of Daily Living     12/22/2023   12:39 PM  In your present state of health, do you have any difficulty performing the following activities:  Hearing? 0  Vision? 0  Difficulty concentrating or making decisions? 1  Comment Mother also assists  Walking or climbing stairs? 0  Dressing or bathing? 0  Doing errands, shopping? 0  Comment mother assists  Preparing Food and eating ? N  Using the Toilet? N  In the past six months, have you accidently leaked urine? N  Do you have problems with loss of bowel control? N  Managing your Medications? N  Managing your Finances? Y  Comment Mother assists  Housekeeping or managing your Housekeeping? Y  Comment Mother assists    Patient Care Team: Rollene [name] LABOR, MD as PCP - General (Internal Medicine) Georjean Darice HERO, MD as Consulting Physician (Neurology) Leila Bound, OD (Optometry) Leila Bound, [place] (Optometry)  I have updated your Care Teams any recent Medical Services you may have received from other providers in the past year.     Assessment:   This is a routine wellness examination for [place].  Hearing/Vision screen Hearing Screening - Comments:: Denies hearing difficulties   Vision  Screening - Comments:: Wears rx glasses - up to date with routine eye exams with [facility]   Goals Addressed               This Visit's Progress     Patient Stated (pt-stated)        Patient stated she plans to continue walk more       Depression Screen     12/22/2023   12:41 PM 11/28/2023    8:35 AM 11/18/2022   10:14 AM 11/11/2021   10:08 AM 07/28/2021    8:47 AM 06/02/2021    8:46 AM 02/06/2021    2:16 PM  PHQ 2/9 Scores  PHQ - 2 Score 1 3 0 2 0 0   PHQ- 9 Score 1 6 0 10     Exception Documentation       Other- indicate reason in comment box  Not completed       Alot of personal issues going on    Fall Risk     12/22/2023   12:40 PM 11/28/2023    8:33 AM 08/15/2023    8:30 AM 01/13/2023    8:35 AM 11/18/2022   10:14 AM  Fall Risk   Falls in the past year? 0 0 1 0 0  Number falls in past yr: 0 0 0 0 0  Injury with Fall? 0 0 0 0 0  Risk for fall due to : No Fall Risks      Follow up Falls evaluation completed;Falls prevention discussed Falls evaluation completed Falls evaluation completed Falls evaluation completed Falls evaluation completed    MEDICARE RISK AT HOME:  Medicare Risk at  Home Any stairs in or around the home?: Yes If so, are there any without handrails?: No Home free of loose throw rugs in walkways, pet beds, electrical cords, etc?: Yes Adequate lighting in your home to reduce risk of falls?: Yes Life alert?: No Use of a cane, walker or w/c?: No Grab bars in the bathroom?: No Shower chair or bench in shower?: Yes Elevated toilet seat or a handicapped toilet?: Yes  TIMED UP AND GO:  Was the test performed?  No  Cognitive Function: 6CIT completed        12/22/2023   12:44 PM  6CIT Screen  What Year? 0 points  What month? 0 points  What time? 0 points  Count back from 20 0 points  Months in reverse 0 points  Repeat phrase 0 points  Total Score 0 points    Immunizations Immunization History  Administered Date(s) Administered    Influenza, Seasonal, Injecte, Preservative Fre 11/18/2022, 11/28/2023   Influenza,inj,Quad PF,6+ Mos 12/18/2018, 11/11/2021   Meningococcal B, OMV 09/20/2016, 10/27/2016   PFIZER(Purple Top)SARS-COV-2 Vaccination 05/15/2019, 06/07/2019   PNEUMOCOCCAL CONJUGATE-20 11/28/2023    Screening Tests Health Maintenance  Topic Date Due   HPV VACCINES (1 - Risk 3-dose series) Never done   HIV Screening  Never done   Hepatitis C Screening  Never done   DTaP/Tdap/Td (1 - Tdap) Never done   Hepatitis B Vaccines 19-59 Average Risk (1 of 3 - 19+ 3-dose series) Never done   COVID-19 Vaccine (3 - Pfizer risk series) 07/05/2019   OPHTHALMOLOGY EXAM  12/23/2023   HEMOGLOBIN A1C  01/01/2024   FOOT EXAM  05/23/2024   Diabetic kidney evaluation - eGFR measurement  06/30/2024   Diabetic kidney evaluation - Urine ACR  06/30/2024   Cervical Cancer Screening (Pap smear)  11/23/2024   Medicare Annual Wellness (AWV)  12/21/2024   Pneumococcal Vaccine  Completed   Influenza Vaccine  Completed   Meningococcal B Vaccine  Completed    Health Maintenance Items Addressed:  12/22/2023  Additional Screening:  Vision Screening: Recommended annual ophthalmology exams for early detection of glaucoma and other disorders of the eye. Is the patient up to date with their annual eye exam?  Yes  Who is the provider or what is the name of the office in which the patient attends annual eye exams? [facility]  Dental Screening: Recommended annual dental exams for proper oral hygiene  Community Resource Referral / Chronic Care Management: CRR required this visit?  No   CCM required this visit?  No   Plan:    I have personally reviewed and noted the following in the patient's chart:   Medical and social history Use of alcohol, tobacco or illicit drugs  Current medications and supplements including opioid prescriptions. Patient is not currently taking opioid prescriptions. Functional ability and status Nutritional  status Physical activity Advanced directives List of other physicians Hospitalizations, surgeries, and ER visits in previous 12 months Vitals Screenings to include cognitive, depression, and falls Referrals and appointments  In addition, I have reviewed and discussed with patient certain preventive protocols, quality metrics, and best practice recommendations. A written personalized care plan for preventive services as well as general preventive health recommendations were provided to patient.   Verdie CHRISTELLA Saba, CMA   12/22/2023   After Visit Summary: (MyChart) Due to this being a telephonic visit, the after visit summary with patients personalized plan was offered to patient via MyChart   Notes: Nothing significant to report at this time.

## 2023-12-30 ENCOUNTER — Telehealth: Payer: Self-pay | Admitting: Podiatry

## 2023-12-30 NOTE — Telephone Encounter (Signed)
 DOS- 02/03/2024  SUBTALAR ARTHRODESIS RT- 71274 BONE GRAFT RT- 20900  [facility] EFFECTIVE DATE- 04/02/2023  DEDUCTIBLE- $257 REMAINING- $0 OOP- $9350 REMAINING- $1300.55 COINSURANCE- 20%  PER [employer] PORTAL, NO PRIOR AUTHS ARE REQUIRED FOR CPT CODES 71274 AND 20900. DECISION ID# [id number]

## 2024-01-02 ENCOUNTER — Ambulatory Visit (INDEPENDENT_AMBULATORY_CARE_PROVIDER_SITE_OTHER): Admitting: Podiatry

## 2024-01-02 ENCOUNTER — Encounter: Payer: Self-pay | Admitting: Podiatry

## 2024-01-02 VITALS — Ht 64.0 in | Wt 187.0 lb

## 2024-01-02 DIAGNOSIS — B351 Tinea unguium: Secondary | ICD-10-CM

## 2024-01-02 MED ORDER — NEOMYCIN-POLYMYXIN-HC 3.5-10000-1 OT SUSP: OTIC | 0 refills | Status: AC

## 2024-01-02 NOTE — Patient Instructions (Signed)

## 2024-01-02 NOTE — Progress Notes (Signed)
  Subjective:  Patient ID: Deanna Levine, female    DOB: 09-08-2000,  MRN: [id number]  Chief Complaint  Patient presents with   Nail Problem    Rm 20 Return in about 1 month (around 12/29/2023) for permanent nail removals (b/l 1st, R 3).    23 y.o. female presents with the above complaint. History confirmed with patient.  Here today for nail removal  Objective:  Physical Exam: warm, good capillary refill, no trophic changes or ulcerative lesions, normal DP and PT pulses, normal sensory exam, and pain and limited range of motion of ankle and subtalar joint, p today she has pain in the sinus tarsi on the right side none left, limited inversion eversion bilaterally but worse on the right, dystrophic mycotic nail bilateral hallux right third toe  Radiographs: Multiple views x-ray of both feet: Degenerative changes of hindfoot and midfoot, no change in interval x-rays taken today   MRIs:  EXAM DESCRIPTION: MR ANKLE LEFT WO CONTRAST   FINDINGS: No fracture. No erosions. Joint space narrowing and osteophytes to the middle subtalar joint suspicious for fibrous coalition. No significant degenerative edema. The marrow signal is unremarkable.   Adequate fat in the sinus tarsi. No joint effusion. The tendons, ligaments, and musculature are unremarkable.   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 No significant degenerative edema.   Electronically signed by: Reyes Frees MD 08/18/2023 07:02 PM EDT RP Workstation: [id number]   EXAM DESCRIPTION: MR ANKLE RIGHT WO CONTRAST   FINDINGS: No fracture. No erosions. Mild marrow edema on either side of the middle facet of the subtalar joint. Joint space narrowing and suspicion of a fibrous coalition as described on prior studies. The marrow edema is improved. Marrow signal otherwise unremarkable.   No joint effusion. Adequate fat in the sinus tarsi. The  tendons, ligaments, and musculature are unremarkable.   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 Mild degenerative marrow edema.   The exam is otherwise unremarkable.   Electronically signed by: Reyes Frees MD 08/18/2023 07:00 PM EDT RP Workstation: [id number] Assessment:   1. Onychomycosis       Plan:  Patient was evaluated and treated and all questions answered.    Dystrophic Nail, bilaterally -Patient elects to proceed with minor surgery to remove Dystrophic toenail today. Consent reviewed and signed by patient. -Dystrophic nail excised. See procedure note. -Educated on post-procedure care including soaking. Written instructions provided and reviewed. -Rx for Cortisporin sent to pharmacy. -Advised on signs and symptoms of infection developing.  We discussed that the phenol likely will create some redness and edema and tenderness around the nailbed as long as it is localized this is to be expected.  Will return as needed if any infection signs develop  Procedure: Excision of Dystrophic Toenail Location: Bilateral hallux and right third toe nail . Anesthesia: Lidocaine  1% plain; 1.5 mL and Marcaine  0.5% plain; 1.5 mL, digital block. Skin Prep: Betadine. Dressing: Silvadene ; telfa; dry, sterile, compression dressing. Technique: Following skin prep, the toe was exsanguinated and a tourniquet was secured at the base of the toe. The affected nail was freed and excised. Chemical matrixectomy was then performed with phenol and irrigated out with alcohol. The tourniquet was then removed and sterile dressing applied. Disposition: Patient tolerated procedure well.     No follow-ups on file.

## 2024-01-05 ENCOUNTER — Ambulatory Visit: Admitting: Behavioral Health

## 2024-01-05 ENCOUNTER — Encounter: Payer: Self-pay | Admitting: "Endocrinology

## 2024-01-05 ENCOUNTER — Ambulatory Visit (INDEPENDENT_AMBULATORY_CARE_PROVIDER_SITE_OTHER): Admitting: "Endocrinology

## 2024-01-05 VITALS — BP 110/80 | HR 86 | Ht 64.0 in | Wt 189.0 lb

## 2024-01-05 DIAGNOSIS — E119 Type 2 diabetes mellitus without complications: Secondary | ICD-10-CM

## 2024-01-05 DIAGNOSIS — Z7985 Long-term (current) use of injectable non-insulin antidiabetic drugs: Secondary | ICD-10-CM

## 2024-01-05 DIAGNOSIS — E7800 Pure hypercholesterolemia, unspecified: Secondary | ICD-10-CM

## 2024-01-05 DIAGNOSIS — F419 Anxiety disorder, unspecified: Secondary | ICD-10-CM | POA: Diagnosis not present

## 2024-01-05 DIAGNOSIS — R4589 Other symptoms and signs involving emotional state: Secondary | ICD-10-CM | POA: Diagnosis not present

## 2024-01-05 LAB — POCT GLYCOSYLATED HEMOGLOBIN (HGB A1C): Hemoglobin A1C: 5.5 % (ref 4.0–5.6)

## 2024-01-05 MED ORDER — TIRZEPATIDE 12.5 MG/0.5ML ~~LOC~~ SOAJ: 12.5000 mg | SUBCUTANEOUS | 1 refills | Status: AC

## 2024-01-05 NOTE — Progress Notes (Signed)
 Outpatient Endocrinology Note Deanna Birmingham, MD  01/05/24   Deanna Levine 05-Apr-2000 [id number]  Referring Provider: Rollene [name] LABOR, * Primary Care Provider: Rollene [name] LABOR, MD Reason for consultation: Subjective   Assessment & Plan  Diagnoses and all orders for this visit:  Controlled type 2 diabetes mellitus without complication, without long-term current use of insulin  (HCC) -     POCT glycosylated hemoglobin (Hb A1C) -     tirzepatide  (MOUNJARO ) 12.5 MG/0.5ML Pen; Inject 12.5 mg into the skin once a week.  Long-term (current) use of injectable non-insulin  antidiabetic drugs  Pure hypercholesterolemia     Diabetes Type II with no known complications  Lab Results  Component Value Date   GFR 118.55 11/18/2022   Hba1c goal less than 7, current Hba1c is  Lab Results  Component Value Date   HGBA1C 5.5 01/05/2024   Will recommend the following: Mounjaro  12.5 mg weekly, max tolerated dose due to GI S/E DexCom not covered Pt got his BG meter and supplies but reports not knowing how to check BG Instructed to come to clinic to learn how to check BG  No known contraindications to any of above medications No history of MEN syndrome/medullary thyroid  cancer/pancreatitis or pancreatic cancer in self or family   -Last LD and Tg are as follows: Lab Results  Component Value Date   LDLCALC 91 07/01/2023    Lab Results  Component Value Date   TRIG 48 07/01/2023   -Follow low fat diet and exercise   -Blood pressure goal <140/90 - Microalbumin/creatinine goal < 30 -Last MA/Cr is as follows: Lab Results  Component Value Date   MICROALBUR <0.2 07/01/2023   -not on ACE/ARB -dit changes including salt restriction -limit eating outside -counseled BP targets per standards of diabetes care -uncontrolled blood pressure can lead to retinopathy, nephropathy and cardiovascular and atherosclerotic heart disease  Reviewed and counseled on: -A1C  target -Blood sugar targets -Complications of uncontrolled diabetes  -Checking blood sugar before meals and bedtime and bring log next visit -All medications with mechanism of action and side effects -Hypoglycemia management: rule of 15's, Glucagon Emergency Kit and medical alert ID -low-carb low-fat plate-method diet -At least 20 minutes of physical activity per day -Annual dilated retinal eye exam and foot exam -compliance and follow up needs -follow up as scheduled or earlier if problem gets worse  Call if blood sugar is less than 70 or consistently above 250    Take a 15 gm snack of carbohydrate at bedtime before you go to sleep if your blood sugar is less than 100.    If you are going to fast after midnight for a test or procedure, ask your physician for instructions on how to reduce/decrease your insulin  dose.    Call if blood sugar is less than 70 or consistently above 250  -Treating a low sugar by rule of 15  (15 gms of sugar every 15 min until sugar is more than 70) If you feel your sugar is low, test your sugar to be sure If your sugar is low (less than 70), then take 15 grams of a fast acting Carbohydrate (3-4 glucose tablets or glucose gel or 4 ounces of juice or regular soda) Recheck your sugar 15 min after treating low to make sure it is more than 70 If sugar is still less than 70, treat again with 15 grams of carbohydrate          Don't drive the hour of hypoglycemia  If unconscious/unable to eat or drink by mouth, use glucagon injection or nasal spray baqsimi and call 911. Can repeat again in 15 min if still unconscious.  Return in about 6 months (around 07/04/2024).   I have reviewed current medications, nurse's notes, allergies, vital signs, past medical and surgical history, family medical history, and social history for this encounter. Counseled patient on symptoms, examination findings, lab findings, imaging results, treatment decisions and monitoring and prognosis.  The patient understood the recommendations and agrees with the treatment plan. All questions regarding treatment plan were fully answered.  Deanna Birmingham, MD  01/05/24    History of Present Illness Deanna Levine is a 23 y.o. year old female who presents for follow up of Type II diabetes mellitus.  Deanna Levine was first diagnosed at age of 84. 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  Home diabetes regimen: Mounjaro  12.5 mg weekly  COMPLICATIONS -  MI/Stroke -  retinopathy -  neuropathy -  nephropathy  BLOOD SUGAR DATA Checking sugars at home 0-1 time a day Range: 108-127  Physical Exam  BP 110/80   Pulse 86   Ht 5' 4 (1.626 m)   Wt 189 lb (85.7 kg)   SpO2 99%   BMI 32.44 kg/m    Constitutional: well developed, well nourished Head: normocephalic, atraumatic Eyes: sclera anicteric, no redness Neck: supple Lungs: normal respiratory effort Neurology: alert and oriented Skin: dry, no appreciable rashes Musculoskeletal: no appreciable defects Psychiatric: normal mood and affect Diabetic Foot Exam - Simple   No data filed      Current Medications Patient's Medications  New Prescriptions   No medications on file  Previous Medications   AZELASTINE  (ASTELIN ) 0.1 % NASAL SPRAY    Place 2 sprays into both nostrils 2 (two) times daily. Use in each nostril as directed   BLOOD GLUCOSE MONITORING SUPPL DEVI    1 each by Does not apply route in the morning, at noon, and at bedtime. May substitute to any manufacturer covered by patient's insurance.   CETIRIZINE (ZYRTEC) 10 MG TABLET    Take 10 mg by mouth daily as needed for allergies.   CHLORHEXIDINE  (HIBICLENS ) 4 % EXTERNAL LIQUID    Apply 1 Application topically daily as needed.   CLINDAMYCIN -BENZOYL PEROXIDE (BENZACLIN) GEL    Apply 1 Application topically 2 (two) times daily.   CONTINUOUS GLUCOSE SENSOR (DEXCOM G7 SENSOR) MISC    1 Device by Does not apply  route continuous.   FIBER ADULT GUMMIES 2 G CHEW    Chew 1 tablet by mouth daily.   FLUTICASONE  (FLONASE ) 50 MCG/ACT NASAL SPRAY    SHAKE LIQUID AND USE 2 SPRAYS IN EACH NOSTRIL DAILY   GLUCOSE BLOOD (ACCU-CHEK GUIDE TEST) TEST STRIP    TEST IN THE MORNING, AT NOON, AND AT BEDTIME   KETOCONAZOLE  (NIZORAL ) 2 % CREAM    Apply to both feet and between toes once daily for 6 weeks.   MELOXICAM  (MOBIC ) 15 MG TABLET    TAKE 1 TABLET(15 MG) BY MOUTH DAILY   MENAQUINONE-7 PO    Take 100 mcg by mouth daily.   NA SULFATE-K SULFATE-MG SULF 17.5-3.13-1.6 GM/177ML SOLN    Take 177 mLs by mouth See admin instructions.   NEOMYCIN-POLYMYXIN-HYDROCORTISONE (CORTISPORIN) 3.5-10000-1 OTIC SUSPENSION    Apply 1-2 drops daily after soaking and cover with bandaid   ONDANSETRON  (ZOFRAN -ODT) 4 MG DISINTEGRATING TABLET    Take 1 tablet (4 mg total) by mouth  every 8 (eight) hours as needed for nausea or vomiting.   PROPYLENE GLYCOL (SYSTANE BALANCE OP)    Apply to eye.   TRIAMCINOLONE  CREAM (KENALOG ) 0.1 %    Apply to rough area of feet twice daily.   ZONISAMIDE  (ZONEGRAN ) 100 MG CAPSULE    TAKE 2 CAPSULES BY MOUTH DAILY  Modified Medications   Modified Medication Previous Medication   TIRZEPATIDE  (MOUNJARO ) 12.5 MG/0.5ML PEN tirzepatide  (MOUNJARO ) 12.5 MG/0.5ML Pen      Inject 12.5 mg into the skin once a week.    Inject 12.5 mg into the skin once a week.  Discontinued Medications   No medications on file    Allergies No Known Allergies  Past Medical History Past Medical History:  Diagnosis Date   Absence seizure disorder (HCC)    Allergy     Colitis 07/16/2021   Complication of anesthesia    slow to wake up from anesthesia, occ HA with anesthesia    Diabetes mellitus without complication (HCC)    type 2   Difficulty swallowing pills    Dyspraxia    Eczema    Global developmental delay    Obesity (BMI 30-39.9)    PONV (postoperative nausea and vomiting)    Precocious female puberty    Seizures (HCC)     last seizure 2012 - no current med   Sleep apnea    White-Sutton syndrome     Past Surgical History Past Surgical History:  Procedure Laterality Date   ADENOIDECTOMY     BALLOON DILATION N/A 11/23/2021   Procedure: BALLOON DILATION;  Surgeon: Eda Iha, MD;  Location: [facility];  Service: Gastroenterology;  Laterality: N/A;   BIOPSY  07/16/2021   Procedure: BIOPSY;  Surgeon: Eda Iha, MD;  Location: [facility] ENDOSCOPY;  Service: Gastroenterology;;   BIOPSY  11/23/2021   Procedure: BIOPSY;  Surgeon: Eda Iha, MD;  Location: [facility];  Service: Gastroenterology;;   bone removal Left 01/16/2021   foot   COLONOSCOPY WITH PROPOFOL  N/A 07/16/2021   Procedure: COLONOSCOPY WITH PROPOFOL ;  Surgeon: Eda Iha, MD;  Location: [facility] ENDOSCOPY;  Service: Gastroenterology;  Laterality: N/A;   ESOPHAGOGASTRODUODENOSCOPY (EGD) WITH PROPOFOL  N/A 11/23/2021   Procedure: ESOPHAGOGASTRODUODENOSCOPY (EGD) WITH PROPOFOL ;  Surgeon: Eda Iha, MD;  Location: [facility];  Service: Gastroenterology;  Laterality: N/A;   FOOT SURGERY Right 11/2022   GASTROCNEMIUS RECESSION Bilateral 04/25/2020   Procedure: BILATERAL GASTROCNEMIUS RECESSION;  Surgeon: Harden Jerona GAILS, MD;  Location: [facility] OR;  Service: Orthopedics;  Laterality: Bilateral;   SUPPRELIN  IMPLANT  05/06/2011   Procedure: SUPPRELIN  IMPLANT;  Surgeon: CHRISTELLA. Julietta Millman, MD;  Location: [facility];  Service: Pediatrics;  Laterality: Right;   SUPPRELIN  IMPLANT Right 06/14/2013   Procedure: REMOVAL OF SUPPRELIN  IMPLANT FROM RIGHT UPPER ARM;  Surgeon: CHRISTELLA. Julietta Millman, MD;  Location: [facility];  Service: Pediatrics;  Laterality: Right;   TONSILLECTOMY     TONSILLECTOMY AND ADENOIDECTOMY     TYMPANOSTOMY TUBE PLACEMENT     x 2    Family History family history includes Anesthesia problems in her mother; Asthma in her maternal aunt; Colon cancer in an other family member; Diabetes in  her maternal grandmother, mother, and paternal grandmother; Fibromyalgia in her mother; Heart disease in her maternal grandfather and paternal grandmother; Henoch-Schonlein purpura in her brother; Hyperlipidemia in her maternal grandfather, maternal grandmother, paternal grandfather, and paternal grandmother; Hypertension in her maternal grandfather, maternal grandmother, paternal grandfather, and paternal grandmother; Kidney disease in her maternal grandfather and paternal  grandmother; Rheum arthritis in her mother; Seizures in her maternal uncle; Ulcerative colitis in her mother.  Social History Social History   Socioeconomic History   Marital status: Single    Spouse name: Not on file   Number of children: 0   Years of education: 12   Highest education level: 12th grade  Occupational History   Occupation: [employer]  Tobacco Use   Smoking status: Never    Passive exposure: Never   Smokeless tobacco: Never  Vaping Use   Vaping status: Never Used  Substance and Sexual Activity   Alcohol use: No    Alcohol/week: 0.0 standard drinks of alcohol   Drug use: No   Sexual activity: Not Currently    Birth control/protection: None  Other Topics Concern   Not on file  Social History Narrative   Deanna Levine is graduated. [name] doing average.    She enjoys playing softball and basketball.   Lives with her parents.    Not working.   Left handed   Two story home      Had surgery on her legs in Feb and is physical therapy twice a week.   Left handed   No caffeine   Social Drivers of Corporate Investment Banker Strain: Low Risk  (12/22/2023)   Overall Financial Resource Strain (CARDIA)    Difficulty of Paying Living Expenses: Not hard at all  Food Insecurity: No Food Insecurity (12/22/2023)   Hunger Vital Sign    Worried About Running Out of Food in the Last Year: Never true    Ran Out of Food in the Last Year: Never true  Transportation Needs: No Transportation Needs (12/22/2023)    PRAPARE - Administrator, Civil Service (Medical): No    Lack of Transportation (Non-Medical): No  Physical Activity: Inactive (12/22/2023)   Exercise Vital Sign    Days of Exercise per Week: 0 days    Minutes of Exercise per Session: 0 min  Stress: No Stress Concern Present (12/22/2023)   [employer] of Occupational Health - Occupational Stress Questionnaire    Feeling of Stress: Not at all  Social Connections: Socially Isolated (12/22/2023)   Social Connection and Isolation Panel    Frequency of Communication with Friends and Family: Once a week    Frequency of Social Gatherings with Friends and Family: Once a week    Attends Religious Services: More than 4 times per year    Active Member of [employer] or Organizations: No    Attends Banker Meetings: Never    Marital Status: Never married  Intimate Partner Violence: Not At Risk (12/22/2023)   Humiliation, Afraid, Rape, and Kick questionnaire    Fear of Current or Ex-Partner: No    Emotionally Abused: No    Physically Abused: No    Sexually Abused: No    Lab Results  Component Value Date   HGBA1C 5.5 01/05/2024   HGBA1C 5.2 07/01/2023   HGBA1C 5.1 08/31/2022   Lab Results  Component Value Date   CHOL 146 07/01/2023   Lab Results  Component Value Date   HDL 41 (L) 07/01/2023   Lab Results  Component Value Date   LDLCALC 91 07/01/2023   Lab Results  Component Value Date   TRIG 48 07/01/2023   Lab Results  Component Value Date   CHOLHDL 3.6 07/01/2023   Lab Results  Component Value Date   CREATININE 0.82 07/01/2023   Lab Results  Component Value Date  GFR 118.55 11/18/2022   Lab Results  Component Value Date   MICROALBUR <0.2 07/01/2023      Component Value Date/Time   NA 137 07/01/2023 0911   NA 131 (L) 03/05/2019 1430   K 4.0 07/01/2023 0911   CL 107 07/01/2023 0911   CO2 22 07/01/2023 0911   GLUCOSE 94 07/01/2023 0911   BUN 13 07/01/2023 0911   BUN 12 03/05/2019 1430    CREATININE 0.82 07/01/2023 0911   CALCIUM 9.7 07/01/2023 0911   PROT 7.7 07/01/2023 0911   PROT 7.0 03/05/2019 1430   ALBUMIN 4.3 11/18/2022 1050   ALBUMIN 4.4 03/05/2019 1430   AST 12 07/01/2023 0911   ALT 8 07/01/2023 0911   ALKPHOS 63 11/18/2022 1050   BILITOT 0.6 07/01/2023 0911   BILITOT 0.4 03/05/2019 1430   GFRNONAA >60 04/25/2020 0646   GFRAA 139 03/05/2019 1430      Latest Ref Rng & Units 07/01/2023    9:11 AM 11/18/2022   10:50 AM 06/01/2022    9:29 AM  BMP  Glucose 65 - 99 mg/dL 94  93  89   BUN 7 - 25 mg/dL 13  12  14    Creatinine 0.50 - 0.96 mg/dL 9.17  9.27  9.29   BUN/Creat Ratio 6 - 22 (calc) SEE NOTE:   SEE NOTE:   Sodium 135 - 146 mmol/L 137  139  141   Potassium 3.5 - 5.3 mmol/L 4.0  4.0  4.4   Chloride 98 - 110 mmol/L 107  108  111   CO2 20 - 32 mmol/L 22  23  20    Calcium 8.6 - 10.2 mg/dL 9.7  9.4  9.3        Component Value Date/Time   WBC 6.8 11/18/2022 1050   RBC 4.86 11/18/2022 1050   HGB 10.3 (L) 11/18/2022 1050   HGB 11.3 03/05/2019 1430   HCT 33.5 (L) 11/18/2022 1050   HCT 35.7 03/05/2019 1430   PLT 329.0 11/18/2022 1050   PLT 182 03/05/2019 1430   MCV 69.0 Repeated and verified X2. (L) 11/18/2022 1050   MCV 67 (L) 03/05/2019 1430   MCH 21.3 (L) 06/01/2022 0929   MCHC 30.6 11/18/2022 1050   RDW 16.4 (H) 11/18/2022 1050   RDW 17.9 (H) 03/05/2019 1430   LYMPHSABS 2,365 02/12/2021 1104   MONOABS 0.5 07/05/2012 1610   EOSABS 26 02/12/2021 1104   BASOSABS 26 02/12/2021 1104     Parts of this note may have been dictated using voice recognition software. There may be variances in spelling and vocabulary which are unintentional. Not all errors are proofread. Please notify the dino if any discrepancies are noted or if the meaning of any statement is not clear.

## 2024-01-05 NOTE — Patient Instructions (Signed)

## 2024-01-05 NOTE — Progress Notes (Addendum)
 [facility] Counselor/Therapist Progress Note  Patient ID: Deanna Levine, MRN: [id number],    Date: 01/05/2024  Time Spent: 45 min Caregility video; Pt is home in private & Provider working remotely from [employer]. Pt is aware of the risks/limitations of telehealth & consents to Tx today. Time In: 1:00pm Time Out: 1:45pm   Treatment Type: Individual Therapy  Reported Symptoms: Elevated anx/dep & stress due to health issues w/her foot  Mental Status Exam: Appearance:  Casual     Behavior: Appropriate and Sharing  Motor: Normal  Speech/Language:  Clear and Coherent  Affect: Appropriate  Mood: normal  Thought process: normal  Thought content:   WNL  Sensory/Perceptual disturbances:   WNL  Orientation: oriented to person, place, time/date, and situation  Attention: Good  Concentration: Good  Memory: WNL  Fund of knowledge:  Good  Insight:   Good  Judgment:  Good  Impulse Control: Good   Risk Assessment: Danger to Self:  No Self-injurious Behavior: No Danger to Others: No Duty to Warn:no Physical Aggression / Violence:No  Access to Firearms a concern: No  Gang Involvement:No   Subjective: [name] is doing her chores today & ambulating well. She has been able to do her chores today w/no problem. Her feet are giving her issues, but she has been managing fine w/Tylenol .   She has been working on the mgm mirage w/her Parents to [employer]   [name] looking forward to Thanksgiving w/her Family @ the house; Bruna Brigham & Mat Str will also come-she is Pt's God Mother. Her Aunt has been good to her.   Pt has thought about her Parents long-term care. She wants a tiny house w/minimal amenities in the back yard. She wants her own space & not disturb your Parents.  Interventions: Psycho-education/Bibliotherapy, Insight-Oriented, and Family Systems  Diagnosis:Anxiety  Anxiety about health  Plan: Levern will cont to care for her foot & the pending surgery she might  expect for her other foot. Her & Mom have the same issues w/feet. She has been thinking & planning about her future. She will try to take a few steps that involve her independence in the next few wks & report back next session about this progress.   Target Date:02/13/2024  Progress: 5  Frequency: Once every 2-3 wks  Modality: [name], LMFT

## 2024-01-10 ENCOUNTER — Other Ambulatory Visit: Payer: Self-pay | Admitting: Neurology

## 2024-01-10 ENCOUNTER — Ambulatory Visit (INDEPENDENT_AMBULATORY_CARE_PROVIDER_SITE_OTHER): Admitting: Podiatry

## 2024-01-10 DIAGNOSIS — M79674 Pain in right toe(s): Secondary | ICD-10-CM

## 2024-01-10 DIAGNOSIS — B351 Tinea unguium: Secondary | ICD-10-CM

## 2024-01-10 DIAGNOSIS — E119 Type 2 diabetes mellitus without complications: Secondary | ICD-10-CM

## 2024-01-10 DIAGNOSIS — M79675 Pain in left toe(s): Secondary | ICD-10-CM | POA: Diagnosis not present

## 2024-01-11 ENCOUNTER — Encounter: Payer: Self-pay | Admitting: Neurology

## 2024-01-17 ENCOUNTER — Encounter: Payer: Self-pay | Admitting: Podiatry

## 2024-01-17 NOTE — Progress Notes (Signed)
  Subjective:  Patient ID: Deanna Levine, female    DOB: 2000/09/05,  MRN: [id number]  Deanna Levine presents to clinic today for preventative diabetic foot care for painful mycotic toenails of both feet that are difficult to trim. Pain interferes with daily activities and wearing enclosed shoe gear comfortably. She has had total nail avulsions of bilateral great toes and right 3rd digit performed by Dr. Silva. Chief Complaint  Patient presents with   Nail Problem    Thick painful toenails, 3 month follow up    Diabetes    A1C 5.5   New problem(s): None.   PCP is Rollene [name] LABOR, MD. Deanna Levine 11/28/2023.  No Known Allergies  Review of Systems: Negative except as noted in the HPI.  Objective: No changes noted in today's physical examination. There were no vitals filed for this visit. Deanna Levine is a pleasant 23 y.o. female in NAD. AAO x 3.  Neurovascular status intact b/l and symmetrically.  Dermatological Examination: Well healed surgical scar(s) noted left foot. Pedal skin with normal turgor, texture and tone b/l. No open wounds nor interdigital macerations noted.   Toenails 2-5 left foot, R 2nd toe, R 4th toe, and R 5th toe elongated, discolored, dystrophic, thickened, and crumbly with subungual debris and tenderness to dorsal palpation. Evidence of total matrixectomy bilateral great toes and R 3rd toe.  Musculoskeletal Examination: Muscle strength 5/5 to b/l LE.  No pain, crepitus noted b/l. No gross pedal deformities. Patient ambulates independently without assistive aids.   Radiographs: None  Assessment/Plan: 1. Pain due to onychomycosis of toenails of both feet   2. Type 2 diabetes mellitus without complication, without long-term current use of insulin  [facility])   Patient was evaluated and treated. All patient's and/or POA's questions/concerns addressed on today's visit. Toenails 2-5 left foot, R 2nd toe, R 4th toe, and R 5th toe debrided in length and girth  without incident. Continue foot and shoe inspections daily. Monitor blood glucose per PCP/Endocrinologist's recommendations. Continue soft, supportive shoe gear daily. Report any pedal injuries to medical professional. Call office if there are any questions/concerns. -Patient/POA to call should there be question/concern in the interim.   Return in about 9 weeks (around 03/13/2024).  Deanna Levine, DPM      [facility] LOCATION: [address] [place] [address]                   Office [phone]   [facility] LOCATION: [address] [place], [place] [address] Office [phone]

## 2024-01-19 ENCOUNTER — Other Ambulatory Visit: Payer: Self-pay | Admitting: Podiatry

## 2024-02-01 ENCOUNTER — Telehealth: Payer: Self-pay | Admitting: Podiatry

## 2024-02-01 NOTE — Telephone Encounter (Signed)
 In regards to patient home health order I have contacted 5 different offices and have been told that they either aren't offering it the area this time or that they aren't able to accept patients insurance. I contacted patient's insurance company for names of practices in patients network and they aren't able  to provide in patient services at this time. Please advise on how we should proceed.

## 2024-02-02 ENCOUNTER — Telehealth: Payer: Self-pay | Admitting: Podiatry

## 2024-02-02 ENCOUNTER — Ambulatory Visit: Admitting: Behavioral Health

## 2024-02-02 ENCOUNTER — Other Ambulatory Visit: Payer: Self-pay | Admitting: Podiatry

## 2024-02-02 DIAGNOSIS — F419 Anxiety disorder, unspecified: Secondary | ICD-10-CM

## 2024-02-02 MED ORDER — GABAPENTIN 300 MG/6ML PO SOLN: 300.0000 mg | Freq: Three times a day (TID) | ORAL | 0 refills | Status: DC

## 2024-02-02 MED ORDER — ASPIRIN 81 MG PO TBEC: 81.0000 mg | DELAYED_RELEASE_TABLET | Freq: Two times a day (BID) | ORAL | 0 refills | Status: DC

## 2024-02-02 MED ORDER — OXYCODONE HCL 5 MG/5ML PO SOLN: 5.0000 mg | ORAL | 0 refills | Status: AC | PRN

## 2024-02-02 MED ORDER — IBUPROFEN 600 MG PO TABS: 600.0000 mg | ORAL_TABLET | Freq: Four times a day (QID) | ORAL | 0 refills | Status: DC | PRN

## 2024-02-02 MED ORDER — ACETAMINOPHEN 500 MG PO TABS: 1000.0000 mg | ORAL_TABLET | Freq: Four times a day (QID) | ORAL | 0 refills | Status: DC | PRN

## 2024-02-02 MED ORDER — ACETAMINOPHEN 160 MG/5ML PO SUSP: 960.0000 mg | Freq: Four times a day (QID) | ORAL | 2 refills | Status: AC | PRN

## 2024-02-02 MED ORDER — ONDANSETRON 4 MG PO TBDP: 4.0000 mg | ORAL_TABLET | Freq: Three times a day (TID) | ORAL | 0 refills | Status: AC | PRN

## 2024-02-02 MED ORDER — ASPIRIN 81 MG PO CHEW: 81.0000 mg | CHEWABLE_TABLET | Freq: Two times a day (BID) | ORAL | 0 refills | Status: AC

## 2024-02-02 MED ORDER — GABAPENTIN 300 MG PO CAPS: 300.0000 mg | ORAL_CAPSULE | Freq: Three times a day (TID) | ORAL | 0 refills | Status: DC

## 2024-02-02 MED ORDER — OXYCODONE HCL 5 MG PO TABS: 5.0000 mg | ORAL_TABLET | ORAL | 0 refills | Status: DC | PRN

## 2024-02-02 NOTE — Progress Notes (Signed)
 [facility] Counselor/Therapist Progress Note  Patient ID: Deanna Levine, MRN: [id number],    Date: 02/02/2024  Time Spent: 30 min Caregility video; Pt is home in private & Provider is working remotely from [employer]. Pt is aware of the risks/limitations of telehealth & consents to Tx today. Time In: 2:00pm Time Out: 2:30pm  Treatment Type: Individual Therapy  Reported Symptoms: Reduction in anx/dep & stress, but w/prep for foot surgery in the morning  Mental Status Exam: Appearance:  Casual     Behavior: Appropriate and Sharing  Motor: Normal  Speech/Language:  Clear and Coherent  Affect: Appropriate  Mood: normal  Thought process: normal  Thought content:   WNL  Sensory/Perceptual disturbances:   WNL  Orientation: oriented to person, place, time/date, and situation  Attention: Good  Concentration: Good  Memory: WNL  Fund of knowledge:  Good  Insight:   Good  Judgment:  Good  Impulse Control: Good   Risk Assessment: Danger to Self:  No Self-injurious Behavior: No Danger to Others: No Duty to Warn:no Physical Aggression / Violence:No  Access to Firearms a concern: No  Gang Involvement:No   Subjective: Pt is preparing for foot surgery in the morning. [name] not worried for the procedure & recovery. Her Parents will both get her to the Surgery Ctr on [facility] & stay there while the surgery is conducted. Her post-surgical recovery will take 6-8 wks. She will use a knee scooter to get around. She will manage her chores. [name] not worried about the surgery-it was a more serious surgery w/hardware invld.   Thanksgiving was a calm, peaceful wknd.   Interventions: Family Systems  Diagnosis:Anxiety  Plan: Deanna Levine will cont to monitor her healing of both feet & alert her Parents to any issues. [name] good about fllw'g medical directions & will do this again w/current surgery.   Target Date: 02/28/2024  Progress: 7  Frequency: Once monthly  Modality:  Deanna Richerd LITTIE Hollace, LMFT

## 2024-02-02 NOTE — Telephone Encounter (Signed)
 Patient mother called in regards to medications sent in for post op. Patient has a hard time with pills and they are requesting a liquid form be sent in.

## 2024-02-03 ENCOUNTER — Encounter: Payer: Self-pay | Admitting: Podiatry

## 2024-02-03 DIAGNOSIS — M19071 Primary osteoarthritis, right ankle and foot: Secondary | ICD-10-CM | POA: Diagnosis not present

## 2024-02-03 DIAGNOSIS — M8970 Major osseous defect, unspecified site: Secondary | ICD-10-CM | POA: Diagnosis not present

## 2024-02-03 MED ORDER — IBUPROFEN 100 MG/5ML PO SUSP: 600.0000 mg | Freq: Four times a day (QID) | ORAL | 2 refills | Status: AC | PRN

## 2024-02-06 ENCOUNTER — Other Ambulatory Visit: Payer: Self-pay | Admitting: Podiatry

## 2024-02-09 ENCOUNTER — Ambulatory Visit

## 2024-02-09 ENCOUNTER — Ambulatory Visit (INDEPENDENT_AMBULATORY_CARE_PROVIDER_SITE_OTHER)

## 2024-02-09 VITALS — Ht 64.0 in | Wt 189.0 lb

## 2024-02-09 DIAGNOSIS — M19071 Primary osteoarthritis, right ankle and foot: Secondary | ICD-10-CM

## 2024-02-13 ENCOUNTER — Other Ambulatory Visit: Payer: Self-pay | Admitting: Podiatry

## 2024-02-15 NOTE — Progress Notes (Signed)
°  Subjective:  Patient ID: Deanna Levine, female    DOB: 02-Nov-2000,  MRN: [id number]  Chief Complaint  Patient presents with   Routine Post Op    POV #1 DOS 02/03/2024 RIGHT SUBTALAR ARTHORODESIS AND BONE GRAFT. Pt is here to f/u on right foot after surgery, she states some pain states pain level is a 4 at this time, has some bleeding to the back of the heel when I took wrapping off.       23 y.o. female returns for post-op check.   Review of Systems: Negative except as noted in the HPI. Denies N/V/F/Ch.   Objective:  There were no vitals filed for this visit. Body mass index is 32.44 kg/m. Constitutional Well developed. Well nourished.  Vascular Foot warm and well perfused. Capillary refill normal to all digits.  Calf is soft and supple, no posterior calf or knee pain, negative Homans' sign  Neurologic Normal speech. Oriented to person, place, and time. Epicritic sensation to light touch grossly present bilaterally.  Dermatologic Skin healing well without signs of infection. Skin edges well coapted without signs of infection.  Orthopedic: Tenderness to palpation noted about the surgical site.   Multiple view plain film radiographs: Status post updated fusion with internal hardware intact and equivalent to immediate postop films Assessment:   1. Arthritis of right subtalar joint    Plan:  Patient was evaluated and treated and all questions answered.  S/p foot surgery right -Progressing as expected post-operatively. -XR: No abnormalities -WB Status: Nonweightbearing in below-knee posterior splint reapplied today -Sutures: Return 2 weeks to remove. -Medications: No refills required -Foot redressed.  Return in about 12 days (around 02/21/2024) for post op (no x-rays), suture removal.

## 2024-02-20 ENCOUNTER — Ambulatory Visit (INDEPENDENT_AMBULATORY_CARE_PROVIDER_SITE_OTHER): Admitting: Behavioral Health

## 2024-02-20 ENCOUNTER — Other Ambulatory Visit: Payer: Self-pay | Admitting: Podiatry

## 2024-02-20 DIAGNOSIS — F419 Anxiety disorder, unspecified: Secondary | ICD-10-CM | POA: Diagnosis not present

## 2024-02-20 NOTE — Progress Notes (Signed)
"    [facility] Counselor/Therapist Progress Note  Patient ID: Deanna Levine, MRN: [id number],    Date: 02/20/2024  Time Spent: 30 min Caregility video; Pt is home in private & Provider working remotely from [employer]. Pt is aware of the risks/limitations of telehealth & consents to Tx today. Time In: 1:00pm Time Out: 1:30pm   Treatment Type: Individual Therapy  Reported Symptoms: Reduction in anx/dep & stress  Mental Status Exam: Appearance:  Casual     Behavior: Appropriate and Sharing  Motor: Normal  Speech/Language:  Clear and Coherent  Affect: Appropriate  Mood: euthymic  Thought process: Unspecified delays in cognitive dvlpment  Thought content:   WNL  Sensory/Perceptual disturbances:   WNL  Orientation: oriented to person, place, time/date, and situation  Attention: Good  Concentration: Good  Memory: WNL  Fund of knowledge:  Good  Insight:   Fair  Judgment:  Good  Impulse Control: Good   Risk Assessment: Danger to Self:  No Self-injurious Behavior: No Danger to Others: No Duty to Warn:no Physical Aggression / Violence:No  Access to Firearms a concern: No  Gang Involvement:No   Subjective: Pt reports she is doing well post foot surgically. She will have a return visit to the surgeon on Tue. She did need pain meds last evening. She has been using Ibuprofen  BID & aspirin . She only takes Oxycodone  during the evening when she tries to sleep.   Pt is keeping up w/her Physician appts. [name] recovering from foot surgery. She has upcoming visits she has to meet in Jan-Feb.   The upcoming Christmas Holidays will be w/Family only.   Interventions: Family Systems  Diagnosis:Anxiety  Plan: Acelyn sts things are going well for her. She feels psychotherapy is helpful & wants to cont. She has a chance to enjoy a Birthday trip w/her Mother in [place]. [name] excited for this trip.  Target Date: 03/15/2024  Progress: 7  Frequency: Once every 3rd  week  Modality: [name], LMFT    "

## 2024-02-21 ENCOUNTER — Encounter: Admitting: Podiatry

## 2024-02-21 ENCOUNTER — Ambulatory Visit (INDEPENDENT_AMBULATORY_CARE_PROVIDER_SITE_OTHER)

## 2024-02-21 ENCOUNTER — Encounter

## 2024-02-21 VITALS — BP 117/73 | HR 96 | Temp 98.4°F

## 2024-02-21 DIAGNOSIS — M19071 Primary osteoarthritis, right ankle and foot: Secondary | ICD-10-CM | POA: Diagnosis not present

## 2024-02-22 NOTE — Progress Notes (Signed)
"  °  Subjective:  Patient ID: Deanna Levine, female    DOB: Nov 22, 2000,  MRN: [id number]  Chief Complaint  Patient presents with   Routine Post Op    POV #2 DOS 02/03/2024 RIGHT SUBTALAR ARTHORODESIS AND BONE GRAFT([facility] PATIENT) R foot Pain level 4 today.  Non weight bearing. Using scooter.   Some Swelling, redness and bruising around sites.       23 y.o. female returns for post-op check. Doing well.   Review of Systems: Negative except as noted in the HPI. Denies N/V/F/Ch.   Objective:   Vitals:   02/21/24 1100  BP: 117/73  Pulse: 96  Temp: 98.4 F (36.9 C)  SpO2: 99%   There is no height or weight on file to calculate BMI. Constitutional Well developed. Well nourished.  Vascular Foot warm and well perfused. Capillary refill normal to all digits.  Calf is soft and supple, no posterior calf or knee pain, negative Homans' sign  Neurologic Normal speech. Oriented to person, place, and time. Epicritic sensation to light touch grossly present bilaterally.  Dermatologic Skin healing well without signs of infection. Skin edges well coapted without signs of infection. Sutures intact. No dehiscence upon suture removal.   Orthopedic: Tenderness to palpation noted about the surgical site.   Assessment:   1. Arthritis of right subtalar joint    Plan:  Patient was evaluated and treated and all questions answered.  S/p foot surgery right -Progressing as expected post-operatively. Doing well in her recovery.  -XR: Not needed today -WB Status: Dispensed CAM walker to patient today. NWB in CAM walker. Treat CAM walker like a splint/cast -Sutures: Removed today without incident. Steri strips applied. Patient can shower and wash foot but is not to let it soak in bath or other still water.  -Medications: No refills required -Foot redressed. - RTC 3-4 weeks with Dr. Silva Prentice Ovens, DPM "

## 2024-03-12 ENCOUNTER — Telehealth: Payer: Self-pay | Admitting: Podiatry

## 2024-03-12 ENCOUNTER — Ambulatory Visit: Admitting: Behavioral Health

## 2024-03-12 DIAGNOSIS — R4589 Other symptoms and signs involving emotional state: Secondary | ICD-10-CM | POA: Diagnosis not present

## 2024-03-12 DIAGNOSIS — F419 Anxiety disorder, unspecified: Secondary | ICD-10-CM

## 2024-03-12 NOTE — Progress Notes (Signed)
"    [facility] Counselor/Therapist Progress Note  Patient ID: Deanna Levine, MRN: [id number],    Date: 03/12/2024  Time Spent: 45 min Caregility video; Pt is home in bedroom in private & Provider working remotely from [employer]. Pt is aware of the risks/limitations of telehealth & consents to Tx  today. Time In: 4:00pm Time Out: 4:30pm  Treatment Type: Individual Therapy  Reported Symptoms: Reduction in anx/dep & health stress  Mental Status Exam: Appearance:  Casual     Behavior: Appropriate and Sharing  Motor: Normal  Speech/Language:  Clear and Coherent  Affect: Appropriate  Mood: normal  Thought process: normal  Thought content:   WNL  Sensory/Perceptual disturbances:   WNL  Orientation: oriented to person, place, time/date, and situation  Attention: Good  Concentration: Good  Memory: WNL  Fund of knowledge:  Good  Insight:   Fair  Judgment:  Fair  Impulse Control: Good   Risk Assessment: Danger to Self:  No Self-injurious Behavior: No Danger to Others: No Duty to Warn:no Physical Aggression / Violence:No  Access to Firearms a concern: No  Gang Involvement:No   Subjective: Pt is voicing a good trajectory for 2026. Pt is reading her scriptures & declaration for upcoming fasts that require her attn to intentions.   Interventions: Family Systems and Interpersonal  Diagnosis:Anxiety  Anxiety about health  Plan: Charolett is reviewing her goals for 2026. 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 She knows technology & is willing to help others. She does want to work PT & keep her safe & healthy. She wants to find a new PT which she wants 60 min w/so Dr. Silva can write for this prescription. Donabelle will use her Ins to work out @ [employer] for free. She will chk w/her Surgeon Dr. Silva for all the restrictions to this workout sessions.   Target Date: 04/15/2024  Progress: 6  Frequency: Once every 2-3 wks  Modality:  [name], LMFT    "

## 2024-03-12 NOTE — Telephone Encounter (Signed)
 Patient's mother called wanting to know if patient should keep her appointment for diabetic foot care tomorrow with Dr. Gaynel due to her still being in a boot with her foot wrapped. She has a post op visit scheduled for this Thursday.

## 2024-03-13 ENCOUNTER — Encounter: Payer: Self-pay | Admitting: Podiatry

## 2024-03-13 ENCOUNTER — Ambulatory Visit (INDEPENDENT_AMBULATORY_CARE_PROVIDER_SITE_OTHER)

## 2024-03-13 ENCOUNTER — Ambulatory Visit: Admitting: Podiatry

## 2024-03-13 DIAGNOSIS — M19071 Primary osteoarthritis, right ankle and foot: Secondary | ICD-10-CM

## 2024-03-13 DIAGNOSIS — E119 Type 2 diabetes mellitus without complications: Secondary | ICD-10-CM

## 2024-03-14 ENCOUNTER — Encounter: Admitting: Podiatry

## 2024-03-15 ENCOUNTER — Encounter: Admitting: Podiatry

## 2024-03-15 ENCOUNTER — Encounter

## 2024-03-19 NOTE — Progress Notes (Signed)
"   °  Subjective:  Patient ID: Deanna Levine, female    DOB: 07/05/2000,  MRN: [id number]  Chief Complaint  Patient presents with   Routine Post Op    RIGHT SUBTALAR ARTHORODESIS AND BONE GRAFT. Wearing Pneumatic cast and using knee scooter. Non weight bearing.   RFC      24 y.o. female returns for post-op check.   Review of Systems: Negative except as noted in the HPI. Denies N/V/F/Ch.   Objective:  There were no vitals filed for this visit. There is no height or weight on file to calculate BMI. Constitutional Well developed. Well nourished.  Vascular Foot warm and well perfused. Capillary refill normal to all digits.  Calf is soft and supple, no posterior calf or knee pain, negative Homans' sign  Neurologic Normal speech. Oriented to person, place, and time. Epicritic sensation to light touch grossly present bilaterally.  Dermatologic Incision well healed and non hypertrophic  Orthopedic: Tenderness to palpation noted about the surgical site.   Multiple view plain film radiographs: healing well without complication or screw position change Assessment:   1. Arthritis of right subtalar joint   2. Type 2 diabetes mellitus without complication, without long-term current use of insulin  (HCC)    Plan:  Patient was evaluated and treated and all questions answered.  S/p foot surgery right Doing well overall cont non WB for 2 more weeks then start gradual WB On CAM boot, return to see me in 6 weeks for new xrays  Return in about 9 weeks (around 05/15/2024).  "

## 2024-03-19 NOTE — Progress Notes (Addendum)
"  °  Subjective:  Patient ID: Deanna Levine, female    DOB: May 11, 2000,  MRN: [id number]  Anapaula Neumeister presents to clinic today for postoperative visit with Dr. Silva and preventative diabetic foot care. Chief Complaint  Patient presents with   Routine Post Op    RIGHT SUBTALAR ARTHORODESIS AND BONE GRAFT. Wearing Pneumatic cast and using knee scooter. Non weight bearing.   RFC    PCP is Rollene [name] LABOR, MD.  Allergies[1]  Review of Systems: Negative except as noted in the HPI.  Objective: No changes noted in today's physical examination. There were no vitals filed for this visit. Deanna Levine is a pleasant 24 y.o. female in NAD. AAO x 3.  Neurovascular status intact b/l and symmetrically.  Dermatological Examination: Well healed surgical scar(s) noted left foot. Pedal skin with normal turgor, texture and tone b/l. No open wounds nor interdigital macerations noted.   Toenails 2-5 left foot, R 2nd toe, R 4th toe, and R 5th toe elongted.  Evidence of total matrixectomy bilateral great toes and R 3rd toe.  Musculoskeletal Examination: Muscle strength 5/5 to b/l LE.  No pain, crepitus noted b/l. No gross pedal deformities. Pneumatic boot RLE. Patient using knee scooter for mobility on today's visit.  Radiographs:  Assessment/Plan: 1. Arthritis of right subtalar joint   2. Type 2 diabetes mellitus without complication, without long-term current use of insulin  (HCC)   See Dr. Darron comments for xray results and evaluation of right foot. Patient was evaluated and treated. All patient's and/or POA's questions/concerns addressed on today's visit. Toenails 2-5 left foot, right second digit, and right fifth digit debrided in length and girth without incident. Continue foot and shoe inspections daily. Monitor blood glucose per PCP/Endocrinologist's recommendations. Continue soft, supportive shoe gear daily. Report any pedal injuries to medical professional. Call office if  there are any questions/concerns. -Patient/POA to call should there be question/concern in the interim.   Return in about 9 weeks (around 05/15/2024).  Delon LITTIE Merlin, DPM      [facility] LOCATION: [address], [place] [address]                   Office [phone]   [facility] LOCATION: [address] [place], [place] [address] Office [phone]      [1] No Known Allergies  "

## 2024-03-26 ENCOUNTER — Ambulatory Visit: Admitting: Behavioral Health

## 2024-04-03 ENCOUNTER — Encounter: Payer: Self-pay | Admitting: Podiatry

## 2024-04-09 ENCOUNTER — Ambulatory Visit: Admitting: Behavioral Health

## 2024-04-16 ENCOUNTER — Ambulatory Visit: Admitting: Neurology

## 2024-04-18 ENCOUNTER — Ambulatory Visit: Admitting: Neurology

## 2024-05-01 ENCOUNTER — Encounter: Admitting: Podiatry

## 2024-05-09 ENCOUNTER — Ambulatory Visit: Admitting: Podiatry

## 2024-05-28 ENCOUNTER — Ambulatory Visit: Admitting: Internal Medicine

## 2024-07-05 ENCOUNTER — Ambulatory Visit: Admitting: "Endocrinology

## 2024-07-11 ENCOUNTER — Ambulatory Visit: Admitting: Podiatry

## 2024-09-28 ENCOUNTER — Ambulatory Visit: Admitting: Neurology

## 2024-11-28 ENCOUNTER — Encounter: Admitting: Internal Medicine

## 2024-12-24 ENCOUNTER — Ambulatory Visit

## 2024-12-24 ENCOUNTER — Encounter: Admitting: Internal Medicine
